# Patient Record
Sex: Female | Born: 1960 | Race: Asian | Hispanic: No | Marital: Married | Smoking: Never smoker
Health system: Southern US, Community
[De-identification: ages and names within clinical notes are randomized; demographics above are authoritative.]

## PROBLEM LIST (undated history)

## (undated) DIAGNOSIS — A0472 Enterocolitis due to Clostridium difficile, not specified as recurrent: Secondary | ICD-10-CM

## (undated) DIAGNOSIS — G9340 Encephalopathy, unspecified: Secondary | ICD-10-CM

## (undated) DIAGNOSIS — D126 Benign neoplasm of colon, unspecified: Secondary | ICD-10-CM

## (undated) DIAGNOSIS — E7800 Pure hypercholesterolemia, unspecified: Secondary | ICD-10-CM

## (undated) DIAGNOSIS — I10 Essential (primary) hypertension: Secondary | ICD-10-CM

## (undated) DIAGNOSIS — J42 Unspecified chronic bronchitis: Secondary | ICD-10-CM

## (undated) DIAGNOSIS — N049 Nephrotic syndrome with unspecified morphologic changes: Secondary | ICD-10-CM

## (undated) DIAGNOSIS — E119 Type 2 diabetes mellitus without complications: Secondary | ICD-10-CM

## (undated) DIAGNOSIS — I2510 Atherosclerotic heart disease of native coronary artery without angina pectoris: Secondary | ICD-10-CM

## (undated) DIAGNOSIS — I509 Heart failure, unspecified: Secondary | ICD-10-CM

## (undated) DIAGNOSIS — K269 Duodenal ulcer, unspecified as acute or chronic, without hemorrhage or perforation: Secondary | ICD-10-CM

## (undated) DIAGNOSIS — E1142 Type 2 diabetes mellitus with diabetic polyneuropathy: Secondary | ICD-10-CM

## (undated) DIAGNOSIS — D649 Anemia, unspecified: Secondary | ICD-10-CM

## (undated) DIAGNOSIS — N184 Chronic kidney disease, stage 4 (severe): Secondary | ICD-10-CM

## (undated) HISTORY — DX: Anemia, unspecified: D64.9

## (undated) HISTORY — DX: Encephalopathy, unspecified: G93.40

## (undated) HISTORY — DX: Chronic kidney disease, stage 4 (severe): N18.4

## (undated) HISTORY — PX: TUBAL LIGATION: SHX77

## (undated) HISTORY — DX: Benign neoplasm of colon, unspecified: D12.6

---

## 2016-10-05 HISTORY — PX: CARDIAC CATHETERIZATION: SHX172

## 2017-10-30 ENCOUNTER — Encounter (HOSPITAL_COMMUNITY): Payer: Self-pay | Admitting: Emergency Medicine

## 2017-10-30 ENCOUNTER — Emergency Department (HOSPITAL_COMMUNITY): Payer: No Typology Code available for payment source

## 2017-10-30 ENCOUNTER — Inpatient Hospital Stay (HOSPITAL_COMMUNITY)
Admission: EM | Admit: 2017-10-30 | Discharge: 2017-11-05 | DRG: 872 | Disposition: A | Discharge status: Rehabilitation Facility | Payer: No Typology Code available for payment source | Attending: Internal Medicine | Admitting: Internal Medicine

## 2017-10-30 DIAGNOSIS — E1122 Type 2 diabetes mellitus with diabetic chronic kidney disease: Secondary | ICD-10-CM | POA: Diagnosis present

## 2017-10-30 DIAGNOSIS — E875 Hyperkalemia: Secondary | ICD-10-CM | POA: Diagnosis not present

## 2017-10-30 DIAGNOSIS — Z960 Presence of urogenital implants: Secondary | ICD-10-CM

## 2017-10-30 DIAGNOSIS — Z9119 Patient's noncompliance with other medical treatment and regimen: Secondary | ICD-10-CM

## 2017-10-30 DIAGNOSIS — Z79899 Other long term (current) drug therapy: Secondary | ICD-10-CM

## 2017-10-30 DIAGNOSIS — Z993 Dependence on wheelchair: Secondary | ICD-10-CM | POA: Diagnosis not present

## 2017-10-30 DIAGNOSIS — J9811 Atelectasis: Secondary | ICD-10-CM | POA: Diagnosis present

## 2017-10-30 DIAGNOSIS — Z87441 Personal history of nephrotic syndrome: Secondary | ICD-10-CM

## 2017-10-30 DIAGNOSIS — D72829 Elevated white blood cell count, unspecified: Secondary | ICD-10-CM

## 2017-10-30 DIAGNOSIS — K269 Duodenal ulcer, unspecified as acute or chronic, without hemorrhage or perforation: Secondary | ICD-10-CM | POA: Diagnosis not present

## 2017-10-30 DIAGNOSIS — R4189 Other symptoms and signs involving cognitive functions and awareness: Secondary | ICD-10-CM | POA: Diagnosis not present

## 2017-10-30 DIAGNOSIS — N319 Neuromuscular dysfunction of bladder, unspecified: Secondary | ICD-10-CM

## 2017-10-30 DIAGNOSIS — R197 Diarrhea, unspecified: Secondary | ICD-10-CM | POA: Diagnosis not present

## 2017-10-30 DIAGNOSIS — N049 Nephrotic syndrome with unspecified morphologic changes: Secondary | ICD-10-CM | POA: Diagnosis not present

## 2017-10-30 DIAGNOSIS — E1165 Type 2 diabetes mellitus with hyperglycemia: Secondary | ICD-10-CM | POA: Diagnosis not present

## 2017-10-30 DIAGNOSIS — K625 Hemorrhage of anus and rectum: Secondary | ICD-10-CM | POA: Diagnosis not present

## 2017-10-30 DIAGNOSIS — Z7901 Long term (current) use of anticoagulants: Secondary | ICD-10-CM | POA: Diagnosis not present

## 2017-10-30 DIAGNOSIS — E1121 Type 2 diabetes mellitus with diabetic nephropathy: Secondary | ICD-10-CM | POA: Diagnosis present

## 2017-10-30 DIAGNOSIS — E162 Hypoglycemia, unspecified: Secondary | ICD-10-CM | POA: Diagnosis not present

## 2017-10-30 DIAGNOSIS — A419 Sepsis, unspecified organism: Principal | ICD-10-CM | POA: Diagnosis present

## 2017-10-30 DIAGNOSIS — I10 Essential (primary) hypertension: Secondary | ICD-10-CM | POA: Diagnosis not present

## 2017-10-30 DIAGNOSIS — D631 Anemia in chronic kidney disease: Secondary | ICD-10-CM | POA: Diagnosis present

## 2017-10-30 DIAGNOSIS — E1140 Type 2 diabetes mellitus with diabetic neuropathy, unspecified: Secondary | ICD-10-CM | POA: Diagnosis present

## 2017-10-30 DIAGNOSIS — N179 Acute kidney failure, unspecified: Secondary | ICD-10-CM | POA: Diagnosis present

## 2017-10-30 DIAGNOSIS — E46 Unspecified protein-calorie malnutrition: Secondary | ICD-10-CM | POA: Diagnosis present

## 2017-10-30 DIAGNOSIS — R29898 Other symptoms and signs involving the musculoskeletal system: Secondary | ICD-10-CM | POA: Diagnosis not present

## 2017-10-30 DIAGNOSIS — E785 Hyperlipidemia, unspecified: Secondary | ICD-10-CM | POA: Diagnosis not present

## 2017-10-30 DIAGNOSIS — F419 Anxiety disorder, unspecified: Secondary | ICD-10-CM | POA: Diagnosis not present

## 2017-10-30 DIAGNOSIS — Z833 Family history of diabetes mellitus: Secondary | ICD-10-CM

## 2017-10-30 DIAGNOSIS — M6281 Muscle weakness (generalized): Secondary | ICD-10-CM

## 2017-10-30 DIAGNOSIS — E860 Dehydration: Secondary | ICD-10-CM | POA: Diagnosis not present

## 2017-10-30 DIAGNOSIS — N390 Urinary tract infection, site not specified: Secondary | ICD-10-CM

## 2017-10-30 DIAGNOSIS — D638 Anemia in other chronic diseases classified elsewhere: Secondary | ICD-10-CM | POA: Diagnosis not present

## 2017-10-30 DIAGNOSIS — I5032 Chronic diastolic (congestive) heart failure: Secondary | ICD-10-CM | POA: Diagnosis not present

## 2017-10-30 DIAGNOSIS — A0472 Enterocolitis due to Clostridium difficile, not specified as recurrent: Secondary | ICD-10-CM | POA: Diagnosis present

## 2017-10-30 DIAGNOSIS — L89152 Pressure ulcer of sacral region, stage 2: Secondary | ICD-10-CM | POA: Diagnosis present

## 2017-10-30 DIAGNOSIS — D123 Benign neoplasm of transverse colon: Secondary | ICD-10-CM | POA: Diagnosis not present

## 2017-10-30 DIAGNOSIS — Z6820 Body mass index (BMI) 20.0-20.9, adult: Secondary | ICD-10-CM

## 2017-10-30 DIAGNOSIS — Z794 Long term (current) use of insulin: Secondary | ICD-10-CM | POA: Diagnosis not present

## 2017-10-30 DIAGNOSIS — K264 Chronic or unspecified duodenal ulcer with hemorrhage: Secondary | ICD-10-CM | POA: Diagnosis not present

## 2017-10-30 DIAGNOSIS — R64 Cachexia: Secondary | ICD-10-CM | POA: Diagnosis not present

## 2017-10-30 DIAGNOSIS — I130 Hypertensive heart and chronic kidney disease with heart failure and stage 1 through stage 4 chronic kidney disease, or unspecified chronic kidney disease: Secondary | ICD-10-CM | POA: Diagnosis present

## 2017-10-30 DIAGNOSIS — R55 Syncope and collapse: Secondary | ICD-10-CM | POA: Diagnosis not present

## 2017-10-30 DIAGNOSIS — I2510 Atherosclerotic heart disease of native coronary artery without angina pectoris: Secondary | ICD-10-CM | POA: Diagnosis present

## 2017-10-30 DIAGNOSIS — I5022 Chronic systolic (congestive) heart failure: Secondary | ICD-10-CM | POA: Diagnosis present

## 2017-10-30 DIAGNOSIS — J810 Acute pulmonary edema: Secondary | ICD-10-CM | POA: Diagnosis not present

## 2017-10-30 DIAGNOSIS — Z7401 Bed confinement status: Secondary | ICD-10-CM

## 2017-10-30 DIAGNOSIS — E11649 Type 2 diabetes mellitus with hypoglycemia without coma: Secondary | ICD-10-CM

## 2017-10-30 DIAGNOSIS — G7281 Critical illness myopathy: Secondary | ICD-10-CM | POA: Diagnosis present

## 2017-10-30 DIAGNOSIS — R159 Full incontinence of feces: Secondary | ICD-10-CM | POA: Diagnosis present

## 2017-10-30 DIAGNOSIS — R7989 Other specified abnormal findings of blood chemistry: Secondary | ICD-10-CM | POA: Diagnosis not present

## 2017-10-30 DIAGNOSIS — T68XXXA Hypothermia, initial encounter: Secondary | ICD-10-CM | POA: Diagnosis not present

## 2017-10-30 DIAGNOSIS — I5020 Unspecified systolic (congestive) heart failure: Secondary | ICD-10-CM | POA: Diagnosis not present

## 2017-10-30 DIAGNOSIS — I82491 Acute embolism and thrombosis of other specified deep vein of right lower extremity: Secondary | ICD-10-CM | POA: Diagnosis not present

## 2017-10-30 DIAGNOSIS — R5381 Other malaise: Secondary | ICD-10-CM | POA: Diagnosis not present

## 2017-10-30 DIAGNOSIS — R195 Other fecal abnormalities: Secondary | ICD-10-CM | POA: Diagnosis not present

## 2017-10-30 DIAGNOSIS — R0602 Shortness of breath: Secondary | ICD-10-CM | POA: Diagnosis not present

## 2017-10-30 DIAGNOSIS — R680 Hypothermia, not associated with low environmental temperature: Secondary | ICD-10-CM | POA: Diagnosis present

## 2017-10-30 DIAGNOSIS — E11319 Type 2 diabetes mellitus with unspecified diabetic retinopathy without macular edema: Secondary | ICD-10-CM | POA: Diagnosis present

## 2017-10-30 DIAGNOSIS — F329 Major depressive disorder, single episode, unspecified: Secondary | ICD-10-CM | POA: Diagnosis present

## 2017-10-30 DIAGNOSIS — Z888 Allergy status to other drugs, medicaments and biological substances status: Secondary | ICD-10-CM

## 2017-10-30 DIAGNOSIS — G9340 Encephalopathy, unspecified: Secondary | ICD-10-CM | POA: Diagnosis present

## 2017-10-30 DIAGNOSIS — R8271 Bacteriuria: Secondary | ICD-10-CM | POA: Diagnosis not present

## 2017-10-30 DIAGNOSIS — R0989 Other specified symptoms and signs involving the circulatory and respiratory systems: Secondary | ICD-10-CM | POA: Diagnosis not present

## 2017-10-30 DIAGNOSIS — R7309 Other abnormal glucose: Secondary | ICD-10-CM | POA: Diagnosis not present

## 2017-10-30 DIAGNOSIS — Z6824 Body mass index (BMI) 24.0-24.9, adult: Secondary | ICD-10-CM

## 2017-10-30 DIAGNOSIS — Z9114 Patient's other noncompliance with medication regimen: Secondary | ICD-10-CM

## 2017-10-30 DIAGNOSIS — J42 Unspecified chronic bronchitis: Secondary | ICD-10-CM | POA: Diagnosis present

## 2017-10-30 DIAGNOSIS — E119 Type 2 diabetes mellitus without complications: Secondary | ICD-10-CM | POA: Diagnosis not present

## 2017-10-30 DIAGNOSIS — Z87440 Personal history of urinary (tract) infections: Secondary | ICD-10-CM | POA: Diagnosis not present

## 2017-10-30 DIAGNOSIS — F88 Other disorders of psychological development: Secondary | ICD-10-CM | POA: Diagnosis not present

## 2017-10-30 DIAGNOSIS — R32 Unspecified urinary incontinence: Secondary | ICD-10-CM | POA: Diagnosis present

## 2017-10-30 DIAGNOSIS — D62 Acute posthemorrhagic anemia: Secondary | ICD-10-CM | POA: Diagnosis present

## 2017-10-30 DIAGNOSIS — R9389 Abnormal findings on diagnostic imaging of other specified body structures: Secondary | ICD-10-CM | POA: Diagnosis not present

## 2017-10-30 DIAGNOSIS — L8990 Pressure ulcer of unspecified site, unspecified stage: Secondary | ICD-10-CM

## 2017-10-30 DIAGNOSIS — E11622 Type 2 diabetes mellitus with other skin ulcer: Secondary | ICD-10-CM | POA: Diagnosis not present

## 2017-10-30 DIAGNOSIS — E1142 Type 2 diabetes mellitus with diabetic polyneuropathy: Secondary | ICD-10-CM | POA: Diagnosis not present

## 2017-10-30 DIAGNOSIS — R1030 Lower abdominal pain, unspecified: Secondary | ICD-10-CM | POA: Diagnosis not present

## 2017-10-30 DIAGNOSIS — N184 Chronic kidney disease, stage 4 (severe): Secondary | ICD-10-CM | POA: Diagnosis present

## 2017-10-30 DIAGNOSIS — I169 Hypertensive crisis, unspecified: Secondary | ICD-10-CM | POA: Diagnosis not present

## 2017-10-30 DIAGNOSIS — R402413 Glasgow coma scale score 13-15, at hospital admission: Secondary | ICD-10-CM | POA: Diagnosis present

## 2017-10-30 DIAGNOSIS — D500 Iron deficiency anemia secondary to blood loss (chronic): Secondary | ICD-10-CM | POA: Diagnosis not present

## 2017-10-30 DIAGNOSIS — D649 Anemia, unspecified: Secondary | ICD-10-CM

## 2017-10-30 DIAGNOSIS — L89159 Pressure ulcer of sacral region, unspecified stage: Secondary | ICD-10-CM | POA: Diagnosis not present

## 2017-10-30 DIAGNOSIS — E118 Type 2 diabetes mellitus with unspecified complications: Secondary | ICD-10-CM | POA: Diagnosis not present

## 2017-10-30 DIAGNOSIS — Z886 Allergy status to analgesic agent status: Secondary | ICD-10-CM

## 2017-10-30 DIAGNOSIS — Z7902 Long term (current) use of antithrombotics/antiplatelets: Secondary | ICD-10-CM

## 2017-10-30 DIAGNOSIS — E861 Hypovolemia: Secondary | ICD-10-CM | POA: Diagnosis present

## 2017-10-30 DIAGNOSIS — M7989 Other specified soft tissue disorders: Secondary | ICD-10-CM | POA: Diagnosis not present

## 2017-10-30 DIAGNOSIS — N183 Chronic kidney disease, stage 3 (moderate): Secondary | ICD-10-CM | POA: Diagnosis not present

## 2017-10-30 DIAGNOSIS — Z8661 Personal history of infections of the central nervous system: Secondary | ICD-10-CM

## 2017-10-30 DIAGNOSIS — N189 Chronic kidney disease, unspecified: Secondary | ICD-10-CM | POA: Diagnosis not present

## 2017-10-30 DIAGNOSIS — R8290 Unspecified abnormal findings in urine: Secondary | ICD-10-CM | POA: Diagnosis not present

## 2017-10-30 DIAGNOSIS — T68XXXD Hypothermia, subsequent encounter: Secondary | ICD-10-CM | POA: Diagnosis not present

## 2017-10-30 HISTORY — DX: Unspecified chronic bronchitis: J42

## 2017-10-30 HISTORY — DX: Type 2 diabetes mellitus without complications: E11.9

## 2017-10-30 HISTORY — DX: Heart failure, unspecified: I50.9

## 2017-10-30 HISTORY — DX: Pure hypercholesterolemia, unspecified: E78.00

## 2017-10-30 HISTORY — DX: Essential (primary) hypertension: I10

## 2017-10-30 LAB — COMPREHENSIVE METABOLIC PANEL
ALBUMIN: 2 g/dL — AB (ref 3.5–5.0)
ALT: 9 U/L — AB (ref 14–54)
AST: 23 U/L (ref 15–41)
Alkaline Phosphatase: 124 U/L (ref 38–126)
Anion gap: 13 (ref 5–15)
BUN: 52 mg/dL — ABNORMAL HIGH (ref 6–20)
CHLORIDE: 103 mmol/L (ref 101–111)
CO2: 21 mmol/L — AB (ref 22–32)
CREATININE: 2.43 mg/dL — AB (ref 0.44–1.00)
Calcium: 8.2 mg/dL — ABNORMAL LOW (ref 8.9–10.3)
GFR calc non Af Amer: 21 mL/min — ABNORMAL LOW (ref >60)
GFR, EST AFRICAN AMERICAN: 24 mL/min — AB (ref >60)
GLUCOSE: 140 mg/dL — AB (ref 65–99)
Potassium: 5.1 mmol/L (ref 3.5–5.1)
SODIUM: 137 mmol/L (ref 135–145)
Total Bilirubin: 1.1 mg/dL (ref 0.3–1.2)
Total Protein: 6 g/dL — ABNORMAL LOW (ref 6.5–8.1)

## 2017-10-30 LAB — URINALYSIS, ROUTINE W REFLEX MICROSCOPIC
Bilirubin Urine: NEGATIVE
Glucose, UA: 50 mg/dL — AB
Hgb urine dipstick: NEGATIVE
KETONES UR: NEGATIVE mg/dL
Nitrite: POSITIVE — AB
Specific Gravity, Urine: 1.015 (ref 1.005–1.030)
pH: 7 (ref 5.0–8.0)

## 2017-10-30 LAB — CBG MONITORING, ED
GLUCOSE-CAPILLARY: 90 mg/dL (ref 65–99)
GLUCOSE-CAPILLARY: 157 mg/dL — AB (ref 65–99)
Glucose-Capillary: 150 mg/dL — ABNORMAL HIGH (ref 65–99)
Glucose-Capillary: 123 mg/dL — ABNORMAL HIGH (ref 65–99)
Glucose-Capillary: 152 mg/dL — ABNORMAL HIGH (ref 65–99)
Glucose-Capillary: 51 mg/dL — ABNORMAL LOW (ref 65–99)
Glucose-Capillary: 66 mg/dL (ref 65–99)

## 2017-10-30 LAB — GLUCOSE, CAPILLARY
GLUCOSE-CAPILLARY: 89 mg/dL (ref 65–99)
Glucose-Capillary: 116 mg/dL — ABNORMAL HIGH (ref 65–99)
Glucose-Capillary: 127 mg/dL — ABNORMAL HIGH (ref 65–99)
Glucose-Capillary: 89 mg/dL (ref 65–99)

## 2017-10-30 LAB — I-STAT CG4 LACTIC ACID, ED
LACTIC ACID, VENOUS: 1.39 mmol/L (ref 0.5–1.9)
LACTIC ACID, VENOUS: 0.52 mmol/L (ref 0.5–1.9)

## 2017-10-30 LAB — CBC
HCT: 31.4 % — ABNORMAL LOW (ref 36.0–46.0)
Hemoglobin: 10.1 g/dL — ABNORMAL LOW (ref 12.0–15.0)
MCH: 26.8 pg (ref 26.0–34.0)
MCHC: 32.2 g/dL (ref 30.0–36.0)
MCV: 83.3 fL (ref 78.0–100.0)
Platelets: 535 10*3/uL — ABNORMAL HIGH (ref 150–400)
RBC: 3.77 MIL/uL — AB (ref 3.87–5.11)
RDW: 15.6 % — ABNORMAL HIGH (ref 11.5–15.5)
WBC: 18.7 10*3/uL — AB (ref 4.0–10.5)

## 2017-10-30 LAB — MRSA PCR SCREENING: MRSA by PCR: NEGATIVE

## 2017-10-30 LAB — LIPASE, BLOOD: Lipase: 17 U/L (ref 11–51)

## 2017-10-30 MED ORDER — ISOSORBIDE MONONITRATE ER 60 MG PO TB24
60.0000 mg | ORAL_TABLET | Freq: Every day | ORAL | Status: DC
  Administered 2017-10-30 – 2017-11-05 (×7): 60 mg via ORAL
  Filled 2017-10-30 (×7): qty 1

## 2017-10-30 MED ORDER — NIFEDIPINE ER OSMOTIC RELEASE 30 MG PO TB24
30.0000 mg | ORAL_TABLET | Freq: Two times a day (BID) | ORAL | Status: DC
  Administered 2017-10-30 – 2017-11-05 (×12): 30 mg via ORAL
  Filled 2017-10-30 (×13): qty 1

## 2017-10-30 MED ORDER — DEXTROSE 50 % IV SOLN
1.0000 | Freq: Two times a day (BID) | INTRAVENOUS | Status: DC | PRN
Start: 2017-10-30 — End: 2017-11-05
  Administered 2017-10-31 (×2): 25 mL via INTRAVENOUS
  Filled 2017-10-30 (×2): qty 50

## 2017-10-30 MED ORDER — SODIUM CHLORIDE 0.9 % IV BOLUS (SEPSIS)
1000.0000 mL | Freq: Once | INTRAVENOUS | Status: AC
  Administered 2017-10-30: 1000 mL via INTRAVENOUS

## 2017-10-30 MED ORDER — EZETIMIBE 10 MG PO TABS
10.0000 mg | ORAL_TABLET | Freq: Every day | ORAL | Status: DC
  Administered 2017-10-30 – 2017-11-05 (×7): 10 mg via ORAL
  Filled 2017-10-30 (×7): qty 1

## 2017-10-30 MED ORDER — CLOPIDOGREL BISULFATE 75 MG PO TABS
75.0000 mg | ORAL_TABLET | Freq: Every day | ORAL | Status: DC
  Administered 2017-10-30 – 2017-11-05 (×7): 75 mg via ORAL
  Filled 2017-10-30 (×7): qty 1

## 2017-10-30 MED ORDER — DEXTROSE 50 % IV SOLN
1.0000 | Freq: Once | INTRAVENOUS | Status: AC
  Administered 2017-10-30: 50 mL via INTRAVENOUS
  Filled 2017-10-30: qty 50

## 2017-10-30 MED ORDER — DEXTROSE 5 % IV SOLN
1.0000 g | INTRAVENOUS | Status: DC
  Administered 2017-10-30 – 2017-10-31 (×2): 1 g via INTRAVENOUS
  Filled 2017-10-30 (×2): qty 10

## 2017-10-30 MED ORDER — BISACODYL 5 MG PO TBEC
10.0000 mg | DELAYED_RELEASE_TABLET | Freq: Every day | ORAL | Status: DC | PRN
Start: 2017-10-30 — End: 2017-10-31

## 2017-10-30 MED ORDER — GLUCERNA SHAKE PO LIQD
237.0000 mL | Freq: Three times a day (TID) | ORAL | Status: DC
  Administered 2017-10-30 – 2017-11-01 (×4): 237 mL via ORAL
  Filled 2017-10-30: qty 237

## 2017-10-30 MED ORDER — HEPARIN SODIUM (PORCINE) 5000 UNIT/ML IJ SOLN
5000.0000 [IU] | Freq: Three times a day (TID) | INTRAMUSCULAR | Status: DC
  Administered 2017-10-30 – 2017-11-05 (×18): 5000 [IU] via SUBCUTANEOUS
  Filled 2017-10-30 (×18): qty 1

## 2017-10-30 MED ORDER — DEXTROSE 50 % IV SOLN
1.0000 | Freq: Once | INTRAVENOUS | Status: AC
  Administered 2017-10-30: 50 mL via INTRAVENOUS

## 2017-10-30 MED ORDER — PANTOPRAZOLE SODIUM 40 MG PO TBEC
40.0000 mg | DELAYED_RELEASE_TABLET | Freq: Every day | ORAL | Status: DC
  Administered 2017-10-30 – 2017-10-31 (×2): 40 mg via ORAL
  Filled 2017-10-30 (×2): qty 1

## 2017-10-30 MED ORDER — GLUCAGON HCL RDNA (DIAGNOSTIC) 1 MG IJ SOLR: 1.0000 mg | Freq: Once | INTRAMUSCULAR | Status: DC | PRN

## 2017-10-30 MED ORDER — DEXTROSE 50 % IV SOLN
INTRAVENOUS | Status: AC
  Administered 2017-10-30: 50 mL via INTRAVENOUS
  Filled 2017-10-30: qty 50

## 2017-10-30 MED ORDER — MIRTAZAPINE 15 MG PO TABS
15.0000 mg | ORAL_TABLET | Freq: Every day | ORAL | Status: DC
  Administered 2017-10-30 – 2017-11-04 (×6): 15 mg via ORAL
  Filled 2017-10-30 (×7): qty 1

## 2017-10-30 MED ORDER — METOPROLOL TARTRATE 50 MG PO TABS
50.0000 mg | ORAL_TABLET | Freq: Two times a day (BID) | ORAL | Status: DC
  Administered 2017-10-30 – 2017-11-05 (×12): 50 mg via ORAL
  Filled 2017-10-30 (×12): qty 1

## 2017-10-30 MED ORDER — GERHARDT'S BUTT CREAM
TOPICAL_CREAM | Freq: Four times a day (QID) | CUTANEOUS | Status: DC
  Administered 2017-10-30: 20:00:00 via TOPICAL
  Administered 2017-10-31: 1 via TOPICAL
  Administered 2017-10-31 – 2017-11-05 (×19): via TOPICAL
  Filled 2017-10-30: qty 1

## 2017-10-30 MED ORDER — VENLAFAXINE HCL ER 75 MG PO CP24
150.0000 mg | ORAL_CAPSULE | Freq: Every day | ORAL | Status: DC
Start: 2017-10-31 — End: 2017-11-05
  Administered 2017-10-31 – 2017-11-05 (×6): 150 mg via ORAL
  Filled 2017-10-30 (×6): qty 2

## 2017-10-30 NOTE — ED Provider Notes (Signed)
[facility]ERG[facility] DEPARTMENT Provider Note   CSN: [id number] Arrival date & time: 10/30/17  6063     History   Chief Complaint Chief Complaint  Patient presents with  . Hypoglycemia    HPI [name] is a 57 y.o. female presenting from [place] living facility for hypoglycemic episodes down to 25 yesterday and today, improved with glucagon. Per triage report, patient's CBG was 25 today and only came up to 67 after glucagon, prompting her to be brought in by EMS. She was given cranberry juice and apple sauce with EMS and sugar improved to 137.  Per EMS reports she is on novolog and lantus but we do not have the doses.  On initial presentation to emergency room, patient is oriented to person but not place or time. She has a cognitive impairment with uncertain baseline. She is able to answer questions appropriately and follow commands. Her rectal temperature was 94.48F taken twice. She endorses diffuse abdominal pain that has been there for about 2 weeks. She denies fevers, congestion or rhinorrhea, wheezing or dyspnea, no chest pain. She has a urinary catheter in place. She has a stage II pressure ulcer above her rectum.    Multiple attempts to speak with the patient's living facility to get more information were unsuccessful (kept getting transferred and then cut off).   Past Medical History:  Diagnosis Date  . Diabetes mellitus without complication ([place])     There are no active problems to display for this patient.   History reviewed. No pertinent surgical history.  OB History    No data available     Home Medications    Prior to Admission medications   Medication Sig Start Date End Date Taking? Authorizing Provider  acetaminophen (TYLENOL) 325 MG tablet Take 650 mg by mouth every 4 (four) hours as needed for mild pain.   Yes [provider]  aluminum-magnesium hydroxide-simethicone (MAALOX) 200-200-20 MG/5ML SUSP Take 60 mLs by mouth  4 (four) times daily -  before meals and at bedtime.   Yes [provider]  bisacodyl (DULCOLAX) 5 MG EC tablet Take 10 mg by mouth daily as needed for moderate constipation.   Yes [provider]  clopidogrel (PLAVIX) 75 MG tablet Take 75 mg by mouth daily.   Yes [provider]  ezetimibe (ZETIA) 10 MG tablet Take 10 mg by mouth daily.   Yes [provider]  furosemide (LASIX) 20 MG tablet Take 20 mg by mouth daily.   Yes [provider]  insulin aspart (NOVOLOG) 100 UNIT/ML injection Inject 0-10 Units into the skin See admin instructions. If blood sugar is <70 call MD, 71-250=0, 251-300=2u, 301-350=4,351-400=6u, 401-500=8u, >500=10u and call MD   Yes [provider]  insulin glargine (LANTUS) 100 UNIT/ML injection Inject 5 Units into the skin at bedtime.   Yes [provider]  isosorbide mononitrate (IMDUR) 60 MG 24 hr tablet Take 60 mg by mouth daily.   Yes [provider]  metoprolol tartrate (LOPRESSOR) 50 MG tablet Take 50 mg by mouth 2 (two) times daily.   Yes [provider]  mirtazapine (REMERON) 15 MG tablet Take 15 mg by mouth at bedtime.   Yes [provider]  modafinil (PROVIGIL) 200 MG tablet Take 200 mg by mouth daily.   Yes [provider]  NIFEdipine (PROCARDIA-XL/ADALAT-CC/NIFEDICAL-XL) 30 MG 24 hr tablet Take 30 mg by mouth 2 (two) times daily.   Yes [provider]  pantoprazole (PROTONIX) 40  MG tablet Take 40 mg by mouth daily.   Yes [provider]  PRESCRIPTION MEDICATION Take 120 mLs by mouth 2 (two) times daily. Med Pass   Yes [provider]  simethicone (MYLICON) 80 MG chewable tablet Chew 80 mg by mouth 4 (four) times daily as needed for flatulence.   Yes [provider]  venlafaxine XR (EFFEXOR-XR) 150 MG 24 hr capsule Take 150 mg by mouth daily with breakfast.   Yes [provider]  venlafaxine XR (EFFEXOR-XR) 75 MG 24 hr  capsule Take 75 mg by mouth daily with breakfast.   Yes [provider]    Family History No family history on file.  Social History Social History   Tobacco Use  . Smoking status: Never Smoker  Substance Use Topics  . Alcohol use: No    Frequency: Never  . Drug use: No     Allergies   Aspirin   Review of Systems Review of Systems See HPI for ROS.   Physical Exam Updated Vital Signs BP (!) 160/96   Pulse 97   Temp (!) 97.2 F (36.2 C) (Rectal)   Resp 10   Ht 5' (1.524 m)   SpO2 96%   Physical Exam  Constitutional:  Thin woman lies in bed, NAD, alert and answering questions appropriately  HENT:  Head: Normocephalic and atraumatic.  Right Ear: External ear normal.  Left Ear: External ear normal.  Mouth/Throat: Oropharynx is clear and moist. No oropharyngeal exudate.  Eyes: Conjunctivae and EOM are normal.  Neck: Neck supple.  Cardiovascular: Normal rate and regular rhythm. Exam reveals no gallop and no friction rub.  No murmur heard. Pulmonary/Chest: Breath sounds normal. She has no wheezes. She has no rales. She exhibits no tenderness.  Poor effort. Comfortable work of breathing.  Abdominal: Soft. She exhibits no distension and no mass. There is no guarding.  +diffuse tenderness to palpation throughout her abdomen without rebound or guarding.  Genitourinary:  Genitourinary Comments: +urinary catheter in place  Musculoskeletal: She exhibits no deformity.  Neurological: No cranial nerve deficit.  Oriented to person but not time, place, or situation  Skin: Skin is warm and dry. Capillary refill takes more than 3 seconds. No rash noted. She is not diaphoretic.  +stage 2 pressure ulcer noted above rectum    ED Treatments / Results  Labs (all labs ordered are listed, but only abnormal results are displayed) Labs Reviewed  URINALYSIS, ROUTINE W REFLEX MICROSCOPIC - Abnormal; Notable for the following components:      Result Value   APPearance CLOUDY  (*)    Glucose, UA 50 (*)    Protein, ur >=300 (*)    Nitrite POSITIVE (*)    Leukocytes, UA LARGE (*)    Bacteria, UA MANY (*)    Squamous Epithelial / LPF 0-5 (*)    All other components within normal limits  CBC - Abnormal; Notable for the following components:   WBC 18.7 (*)    RBC 3.77 (*)    Hemoglobin 10.1 (*)    HCT 31.4 (*)    RDW 15.6 (*)    Platelets 535 (*)    All other components within normal limits  COMPREHENSIVE METABOLIC PANEL - Abnormal; Notable for the following components:   CO2 21 (*)    Glucose, Bld 140 (*)    BUN 52 (*)    Creatinine, Ser 2.43 (*)    Calcium 8.2 (*)    Total Protein 6.0 (*)    Albumin  2.0 (*)    ALT 9 (*)    GFR calc non Af Amer 21 (*)    GFR calc Af Amer 24 (*)    All other components within normal limits  CBG MONITORING, ED - Abnormal; Notable for the following components:   Glucose-Capillary 150 (*)    All other components within normal limits  CBG MONITORING, ED - Abnormal; Notable for the following components:   Glucose-Capillary 123 (*)    All other components within normal limits  CBG MONITORING, ED - Abnormal; Notable for the following components:   Glucose-Capillary 51 (*)    All other components within normal limits  CBG MONITORING, ED - Abnormal; Notable for the following components:   Glucose-Capillary 152 (*)    All other components within normal limits  CULTURE, BLOOD (ROUTINE X 2)  CULTURE, BLOOD (ROUTINE X 2)  LIPASE, BLOOD  I-STAT CG4 LACTIC ACID, ED  I-STAT CG4 LACTIC ACID, ED    EKG  EKG Interpretation None       Radiology Dg Chest 2 View  Result Date: 10/30/2017 CLINICAL DATA:  Chest infection. EXAM: CHEST  2 VIEW COMPARISON:  10/02/2017. FINDINGS: Cardiomegaly and/or pericardial fluid. Bilateral pleural effusions layering dependently with dependent atelectasis. Mild interstitial edema pattern diffusely. No significant bone finding. IMPRESSION: Enlarged cardiac silhouette. Interstitial edema pattern.  Bilateral effusions with dependent atelectasis. Electronically Signed   By: Nelson Chimes M.D.   On: 10/30/2017 09:34    Procedures Procedures (including critical care time)  Medications Ordered in ED Medications  cefTRIAXone (ROCEPHIN) 1 g in dextrose 5 % 50 mL IVPB (0 g Intravenous Stopped 10/30/17 1255)  sodium chloride 0.9 % bolus 1,000 mL (0 mLs Intravenous Stopped 10/30/17 1026)  sodium chloride 0.9 % bolus 1,000 mL (0 mLs Intravenous Stopped 10/30/17 1247)  dextrose 50 % solution 50 mL (50 mLs Intravenous Given 10/30/17 1250)     Initial Impression / Assessment and Plan / ED Course  I have reviewed the triage vital signs and the nursing notes.  Pertinent labs & imaging results that were available during my care of the patient were reviewed by me and considered in my medical decision making (see chart for details).    82yoF presenting from nursing facility with hypoglycemia and hypothermia. Ddx includes iatrogenic with administration of novolog or lantus at the nursing home, vs. Infectious given hypothermia to 94.66F, leukocytosis to 18.7. Most likely infectious source is UTI with positive UA for nitrites, large leuks, many bacteria, likely 2/2 indwelling catheter. Patient notably also has AKI. Other infectious etiologies considered include PNA, which is less likely with negative CXR. Grade II pressure ulcer considered as infectious source, though this seems less likely given that there is no purulent drainage or surrounding cellulitis.  Patient was given IV fluid bolus x2 and ceftriaxone for UTI. Foley was replaced. Bear hugger was placed until patient was normothermic. Patient dropped glucose to 50 requiring D50 again. Iatrogenic cause of hypoglycemia seems most likely, particularly if patient normally gets lantus at assisted living facility and has had poor PO intake recently.  Because patient had multiple hypoglycemic episodes and has limited communication abilities with cognitive  impairment, it is appropriate for patient to be observed overnight with serial CBGs. Case was discussed with admitting team.  Final Clinical Impressions(s) / ED Diagnoses   Final diagnoses:  Hypoglycemia  Lower urinary tract infectious disease    ED Discharge Orders    None       Everrett Coombe, MD 10/30/17 1423  Carmin Muskrat, MD 10/31/17 [phone]

## 2017-10-30 NOTE — ED Triage Notes (Signed)
Patient coming from [facility] with low blood sugars that started yesterday. Sugar yesterday was 37, which came up after glucagon. Sugar today was 25 that only came up to 67 after glucagon. Ems reports sugar 137. Patient received cranberry juice and apples sauce with ems. Patient has mild cognitive disorder. Patient only oriented to self and drowsy upon arrival.

## 2017-10-30 NOTE — Progress Notes (Signed)
Pt with Moisture associated skin damage from area above rectum to the labial area.  Pt/husband states she has had diarrhea.  Pt has a foley in place and PTA.  Consulted with Maudie Flakes, WOC via phone and recommendation for Gerhardts butt cream and mattress replacement given. Orders placed. Pt to lay side to side with no time on back.

## 2017-10-30 NOTE — Progress Notes (Signed)
CSW received consult for patient because she came from [facility]. Patient is being admitted but CSW noticed unsuccessful attempts from nursing staff to reach facility to get history on patient. CSW placed call to [place] at [facility], informed her of situation and gave her ED Secretary number to have the facility nurse report to.  Madilyn Fireman, MSW, LCSW-A Weekend Clinical Social Worker [phone]

## 2017-10-30 NOTE — ED Notes (Signed)
Dextrose 50 given 

## 2017-10-30 NOTE — H&P (Signed)
Date: 10/30/2017               Patient Name:  Laura [name] MRN: [id number]  DOB: 01-May-1961 Age / Sex: 57 y.o., female   PCP: System, Pcp Not In         Medical Service: Internal Medicine Teaching Service         Attending Physician: Dr. Evette Doffing, Mallie Mussel, *    First Contact: Dr. Frederico Hamman Pager: [phone]  Second Contact: Dr. Hetty Ely  Pager: [phone]       After Hours (After 5p/  First Contact Pager: [phone]  weekends / holidays): Second Contact Pager: [phone]   Chief Complaint: Hypoglycemia   History of Present Illness:  Laura [name] is a 57 year old female with history of insulin-dependent T2DM, ?HFrEF (EF 40%), and cognitive delay who presents to the ED with persistent hypoglycemia.  Patient is a poor historian and is unable to provide history.  Husband at bedside provides most of the history.  Patient was admitted to [facility] on 08/2017 and was diagnosed with systolic heart failure.  She was also found to be in acute renal failure and needed several sessions of hemodialysis.  Her renal function did not recover to baseline after this.  She continues to make urine however Foley catheter in place due to inability to void spontaneously since then.  She is also severely deconditioned and is no longer able to ambulate. She was admitted for 1 month and discharged to [facility] where she has continued to receive PT with very minimal improvement in functional status.  Per husband, patient is ambulatory at baseline and able to perform all ADLs independently. At the SNF, patient has been having poor appetite due to abdominal pain.  However, it appears that she has continue to receive sliding scale insulin despite decreased p.o. Intake. Last night, her BG was 25 and this morning BG was low again which prompted staff to call EMS. She denies headache, dizziness, chest pain, shortness of breath, cough, and N/V. She does report loose stools for the past 7 days.   Attempted to call  [facility] on 3 separate occasions with no success. Review of documentation from SNF shows patient received SS insulin at 1:30pm and Lantus 5 at 10PM.   ED course: Patient was hypothermic T 94.3, BP 144/83, HR 88, 98* on RA. Labwork remarkable for WBC 18.2, Hgb 10.1 (unknown baseline), and Cr 2.43 (unknown baseline). Her UA showed nitrites, leukocytes, many bacteria, and glucosuria.  She required Dextrose on 2 separate occasions for CBGs 51 and 66. She also received 2L NS bolus and started on Rocephin.   Review of Systems: A complete ROS was negative except as per HPI.   Meds:  Current Meds  Medication Sig  . acetaminophen (TYLENOL) 325 MG tablet Take 650 mg by mouth every 4 (four) hours as needed for mild pain.  Marland Kitchen aluminum-magnesium hydroxide-simethicone (MAALOX) [phone] MG/5ML SUSP Take 60 mLs by mouth 4 (four) times daily -  before meals and at bedtime.  . bisacodyl (DULCOLAX) 5 MG EC tablet Take 10 mg by mouth daily as needed for moderate constipation.  . clopidogrel (PLAVIX) 75 MG tablet Take 75 mg by mouth daily.  Marland Kitchen ezetimibe (ZETIA) 10 MG tablet Take 10 mg by mouth daily.  . furosemide (LASIX) 20 MG tablet Take 20 mg by mouth daily.  . insulin aspart (NOVOLOG) 100 UNIT/ML injection Inject 0-10 Units into the skin See admin instructions. If blood sugar is <70 call  MD, 71-250=0, 251-300=2u, 301-350=4,351-400=6u, 401-500=8u, >500=10u and call MD  . insulin glargine (LANTUS) 100 UNIT/ML injection Inject 5 Units into the skin at bedtime.  . isosorbide mononitrate (IMDUR) 60 MG 24 hr tablet Take 60 mg by mouth daily.  . metoprolol tartrate (LOPRESSOR) 50 MG tablet Take 50 mg by mouth 2 (two) times daily.  . mirtazapine (REMERON) 15 MG tablet Take 15 mg by mouth at bedtime.  . modafinil (PROVIGIL) 200 MG tablet Take 200 mg by mouth daily.  Marland Kitchen NIFEdipine (PROCARDIA-XL/ADALAT-CC/NIFEDICAL-XL) 30 MG 24 hr tablet Take 30 mg by mouth 2 (two) times daily.  . pantoprazole (PROTONIX) 40 MG tablet  Take 40 mg by mouth daily.  Marland Kitchen PRESCRIPTION MEDICATION Take 120 mLs by mouth 2 (two) times daily. Med Pass  . simethicone (MYLICON) 80 MG chewable tablet Chew 80 mg by mouth 4 (four) times daily as needed for flatulence.  . venlafaxine XR (EFFEXOR-XR) 150 MG 24 hr capsule Take 150 mg by mouth daily with breakfast.  . venlafaxine XR (EFFEXOR-XR) 75 MG 24 hr capsule Take 75 mg by mouth daily with breakfast.     Allergies: Allergies as of 10/30/2017 - Review Complete 10/30/2017  Allergen Reaction Noted  . Aspirin Nausea Only 10/30/2017   Past Medical History:  Diagnosis Date  . Diabetes mellitus without complication (HCC)     Family History:  DM - all siblings  Cancer - father   Social History:  Lives at home with husband. Has 2 children. Has cognitive delays but able to perform ADLs independently. Denies tobacco, alcohol, and drug use. She does not have a regular PCP.    Physical Exam: Blood pressure (!) 166/94, pulse (!) 102, temperature (!) 97.2 F (36.2 C), temperature source Rectal, resp. rate 16, height 5' (1.524 m), SpO2 96 %.  General: very pleasant female, appears chronically-ill and malnourished, sleeping in bed in no acute distress   HENT: NCAT, neck supple and FROM, OP clear without exudates or erythema, poor dentition  Cardiac: regular rate and rhythm, nl S1/S2, no murmurs, rubs or gallops  Pulm: bibasilar crackles, no increased work of breathing on room air  Abd: soft, non-distended, diffusely tender to palpation, normoactive bowel sounds present Neuro: A&Ox3, CN II-XII intact, grip strength intact bilaterally,  5/5 strength on bilateral upper extremities, 2/5 strength on bilateral lower extremities  Ext: warm and well perfused, trace pedal edema noted, diminshed DP pulses bilaterally  Derm: there is a pressure ulcer on sacrum, dressing c/d/i   EKG: none obtained  CXR: personally reviewed my interpretation is bilateral pleural effusions, no opacities noted    Assessment & Plan by Problem: Active Problems:   Urinary tract infection  # Hypoglycemia: Patient presents after 2 episodes of hypoglycemia at SNF. She required 2 doses of dextrose in the ED for persistent hypoglycemia.  Review of medication list shows that she received Lantus 5 last night. BG log with several readings below 70.  - CBG monitoring q2h  - Dextrose amp if CBG <80 - Holding insulin regimen   # UTI: Patient has had chronic indwelling foley catheter for the past ~ 2 months. Unclear how often this is exchanged or the last time it was exchanged. Per husband, patient lost her ability to void spontaneously 2 months ago during hospitalization at [facility]. UA on admission with nitrites, large leukocytes, and many bacteria.  - Follow up Ucx - Continue Rocephin  - Exchange catheter   # Deconditioning: Secondary to recent prolonged hospitalization.  Per husband, patient able to  ambulate and perform ADLs independently prior to hospitalization.  Now unable to ambulate or move lower extremities.  - PT  - SW consult   # ?CKD: Baseline creatinine unknown. Cr 2.4 today. Per husband, patient did not have a history of kidney disease prior to November admission. -We will monitor renal function - Strict I/Os -We will hold home Lasix as unclear if patient has an AKI -Avoid nephrotoxic medications -Holding home modafinil (unclear indication)  # Insulin-dependent T2DM: Patient was diagnosed with type 2 diabetes 20 years ago.  Her hyperglycemia has been difficult to control due to medication noncompliance.  Her insulin regimen is unclear at this time.  Per SNF documentation, she is receiving Lantus 5 at bedtime and sliding scale insulin. -Hold insulin in the setting of persistent hypoglycemia  # ?HFrEF: Per husband's report, she was diagnosed with systolic HF during her admission on 08/2017. Her EF at the time was 40 %, though no records available to confirm this. On exam, she has mild bibasilar  crackles and trace pedal edema.  No JVD noted. She does not follow up with cardiology.  We will continue to monitor volume and respiratory status as she received 2 L NS bolus in the ED. - Strict I/Os + daily weights  - Continue home metoprolol 50 mg BID - Continue home Imdur 60 mg QD  - Continue home Procardia 30 mg BID - Requesting medical records from [facility]  # HLD:  - Continue home Zetia 10 mg QD   * Patient is on Plavix, though unclear why. ?CVA. Per husband, patient's weakness is worse on her RLE. *  F: none  E: monitoring  N: Liquids    VTE ppx: SQ heparin  Code status: Full code , confirmed on admission    Dispo: Admit patient to Inpatient with expected length of stay greater than 2 midnights.  SignedWelford Roche, MD 10/30/2017, 4:41 PM  Pager: [phone]

## 2017-10-30 NOTE — ED Notes (Signed)
Warm therapy/bear hugger applied

## 2017-10-30 NOTE — ED Notes (Signed)
cbg 51 EDP aware

## 2017-10-30 NOTE — ED Notes (Signed)
Attempted report 

## 2017-10-30 NOTE — ED Notes (Signed)
Bear hugger removed and warm blankets applied per edp verbal order

## 2017-10-31 DIAGNOSIS — Z9114 Patient's other noncompliance with medication regimen: Secondary | ICD-10-CM

## 2017-10-31 LAB — CBC
HCT: 25.9 % — ABNORMAL LOW (ref 36.0–46.0)
HEMOGLOBIN: 8.5 g/dL — AB (ref 12.0–15.0)
MCH: 27.3 pg (ref 26.0–34.0)
MCHC: 32.8 g/dL (ref 30.0–36.0)
MCV: 83.3 fL (ref 78.0–100.0)
PLATELETS: 494 10*3/uL — AB (ref 150–400)
RBC: 3.11 MIL/uL — ABNORMAL LOW (ref 3.87–5.11)
RDW: 15.8 % — AB (ref 11.5–15.5)
WBC: 16.4 10*3/uL — ABNORMAL HIGH (ref 4.0–10.5)

## 2017-10-31 LAB — BASIC METABOLIC PANEL
Anion gap: 10 (ref 5–15)
BUN: 43 mg/dL — ABNORMAL HIGH (ref 6–20)
CALCIUM: 7.8 mg/dL — AB (ref 8.9–10.3)
CHLORIDE: 108 mmol/L (ref 101–111)
CO2: 21 mmol/L — ABNORMAL LOW (ref 22–32)
CREATININE: 2.11 mg/dL — AB (ref 0.44–1.00)
GFR calc Af Amer: 29 mL/min — ABNORMAL LOW (ref >60)
GFR, EST NON AFRICAN AMERICAN: 25 mL/min — AB (ref >60)
Glucose, Bld: 123 mg/dL — ABNORMAL HIGH (ref 65–99)
Potassium: 3.4 mmol/L — ABNORMAL LOW (ref 3.5–5.1)
SODIUM: 139 mmol/L (ref 135–145)

## 2017-10-31 LAB — GLUCOSE, CAPILLARY
GLUCOSE-CAPILLARY: 93 mg/dL (ref 65–99)
GLUCOSE-CAPILLARY: 66 mg/dL (ref 65–99)
GLUCOSE-CAPILLARY: 120 mg/dL — AB (ref 65–99)
GLUCOSE-CAPILLARY: 195 mg/dL — AB (ref 65–99)
Glucose-Capillary: 62 mg/dL — ABNORMAL LOW (ref 65–99)
Glucose-Capillary: 116 mg/dL — ABNORMAL HIGH (ref 65–99)
Glucose-Capillary: 125 mg/dL — ABNORMAL HIGH (ref 65–99)
Glucose-Capillary: 80 mg/dL (ref 65–99)
Glucose-Capillary: 121 mg/dL — ABNORMAL HIGH (ref 65–99)

## 2017-10-31 LAB — C DIFFICILE QUICK SCREEN W PCR REFLEX
C DIFFICILE (CDIFF) INTERP: DETECTED
C DIFFICILE (CDIFF) TOXIN: POSITIVE — AB
C Diff antigen: POSITIVE — AB

## 2017-10-31 MED ORDER — POTASSIUM CHLORIDE CRYS ER 20 MEQ PO TBCR: 60.0000 meq | EXTENDED_RELEASE_TABLET | Freq: Once | ORAL | Status: DC

## 2017-10-31 MED ORDER — NYSTATIN 100000 UNIT/GM EX CREA
TOPICAL_CREAM | Freq: Two times a day (BID) | CUTANEOUS | Status: DC
  Administered 2017-10-31 – 2017-11-05 (×10): via TOPICAL
  Filled 2017-10-31 (×2): qty 15

## 2017-10-31 MED ORDER — POTASSIUM CHLORIDE CRYS ER 20 MEQ PO TBCR
40.0000 meq | EXTENDED_RELEASE_TABLET | Freq: Once | ORAL | Status: AC
  Administered 2017-10-31: 40 meq via ORAL
  Filled 2017-10-31: qty 2

## 2017-10-31 MED ORDER — INSULIN ASPART 100 UNIT/ML ~~LOC~~ SOLN
0.0000 [IU] | Freq: Three times a day (TID) | SUBCUTANEOUS | Status: DC
  Administered 2017-11-01 – 2017-11-02 (×2): 3 [IU] via SUBCUTANEOUS
  Administered 2017-11-02: 1 [IU] via SUBCUTANEOUS
  Administered 2017-11-02 – 2017-11-04 (×5): 2 [IU] via SUBCUTANEOUS
  Administered 2017-11-04 – 2017-11-05 (×2): 1 [IU] via SUBCUTANEOUS

## 2017-10-31 MED ORDER — HYDROCERIN EX CREA
TOPICAL_CREAM | Freq: Two times a day (BID) | CUTANEOUS | Status: DC
  Administered 2017-10-31 – 2017-11-05 (×10): via TOPICAL
  Filled 2017-10-31: qty 113

## 2017-10-31 MED ORDER — POTASSIUM CHLORIDE CRYS ER 20 MEQ PO TBCR: 40.0000 meq | EXTENDED_RELEASE_TABLET | Freq: Two times a day (BID) | ORAL | Status: DC

## 2017-10-31 MED ORDER — VANCOMYCIN 50 MG/ML ORAL SOLUTION
125.0000 mg | Freq: Four times a day (QID) | ORAL | Status: DC
  Administered 2017-10-31 – 2017-11-01 (×5): 125 mg via ORAL
  Filled 2017-10-31 (×8): qty 2.5

## 2017-10-31 NOTE — Progress Notes (Signed)
Hypoglycemic Event  CBG: 62 (@ 0205)  Treatment: D50 IV 25 mL  Symptoms: None  Follow-up CBG: Time: 0249 CBG Result:116  Possible Reasons for Event: Unknown  Comments/MD notified: Notified [facility] via text page @ 0230 of pt's CBG 62 and that patient refused oral CHO intake. Therefore, D50 IV 72ml given per hypoglycemic protocol. Notified Harden via text page @ [phone] that patient's CBG increased to 116. No additional orders given.    [place]

## 2017-10-31 NOTE — Progress Notes (Signed)
Pt blood sugar at last check 189 spoke with physician Dr Hetty Ely  and he will review orders if changes need to be made

## 2017-10-31 NOTE — Progress Notes (Signed)
Paged Teaching service to notify of postive C-dif in stool.  Order placed for precautions

## 2017-10-31 NOTE — Progress Notes (Signed)
Hypoglycemic Event  CBG: 66 (@ 0617)  Treatment: D50 IV 25 mL  Symptoms: None  Follow-up CBG: Time: 0720 CBG Result:120  Possible Reasons for Event: Unknown  Comments/MD notified: Pt's CBG 66 @ 0617. Pt refused oral intake. Notified H[facility] of pt's CBG and pt's refusal of oral intake. 25cc D50 IV given per hypoglycemic protocol. CBG recheck was 120. No additional orders received.    [place]

## 2017-10-31 NOTE — Consult Note (Signed)
[place] Nurse wound consult note Reason for Consult: Unstageable pressure injury to coccyx, discoloration suspicious for sustained fungal overgrowth secondary to incontinence in the perineal area Wound type: Pressure plus moisture (sustained) due to fecal and urinary incontinence  Pressure Injury POA: Yes Measurement: Coccygeal area with total area of involvement measuring 4cm x 3cm iwith 2cm x 1.5cm yellow slough obscuring true wound depth. (Unstageable). Surrounding tissue is partial thickness with pink, moist wound bed. Perineal skin discoloration: deep maroon non-blanching tissue suspicious for fungal involvement into dermis.  Wound bed:As described above Drainage (amount, consistency, odor) Scant serous from autolytically debriding coccygeal pressure injury Periwound:As described above Dressing procedure/placement/frequency: I was telephonically consulted on this patient yesterday (see note from [place] dated 10/30/17) and initiated therapy with a mattress replacement with klow air loss feature and 4 times daily application of Gerhart's Butt cream (1:1:1 compounded preparation of hydrocortisone, zinc oxide and lotrimin cream) to the area of discoloration. I suggest system antifungal treatment for 7-10 days (e.g. Diflucan) to see if perineal area will improve. If not improvement, may require scraping (or punch biopsy) for definitve diagnosis by Dermatology.  If you agree, please order Diflucan. The patient's nutritional status requires supplementation and dietician oversight. If you agree, please order. Bilateral heels are at risk and to mitigate this I have added pressure redistribution heel boots.  Patient is to be turned and repositioned from side to side and miniize time in the supine position to meals, only.  [place] nursing team will not follow, but will remain available to this patient, the nursing and medical teams.  Please re-consult if needed. Thanks, Maudie Flakes, MSN, RN, [place],  Arther Abbott  Pager# [phone]

## 2017-10-31 NOTE — Progress Notes (Signed)
Physical Therapy Evaluation Patient Details Name: Laura [name] MRN: [id number] DOB: [birth year] Today's Date: 10/31/2017   History of Present Illness  [name] a 57 year old female with history of insulin-dependentT2DM, ?HFrEF (EF 40%), cognitive delay, systolic heart failure,  acute renal failure, and severe deconditioning. Admitted from [facility] where patient had limited functional mobility - now admitted on 10/30/17 due to hypogylcemia and hypothermia.   Clinical Impression  Patient admitted with the above listed diagnosis. Patient is a poor historian, therefore PLOF most gleaned from chart review and nurse. PTA, patient had been a resident of [place], with limited functional mobility and required assistance for ADLs and IADLs. Patient today requiring Min A for supine to/from sit for LE management as well as VC for hand placement and sequencing of transfer with limited carryover. Patient sitting EOB with B UE support and only supervision from PT for ~5 minutes. Attempted sit to stand, however, patient reporting dizziness and inability to follow commands, therefore transfer deferred for today. Patient to benefit from SNF/rehab facility to continue to promote and progress mobility. PT to continue to follow acutely to maximize functional mobility prior to d/c.     Follow Up Recommendations SNF;Supervision/Assistance - 24 hour    Equipment Recommendations  Other (comment)(will determine at next venue of care)    Recommendations for Other Services OT consult     Precautions / Restrictions Precautions Precautions: Fall Restrictions Weight Bearing Restrictions: No      Mobility  Bed Mobility Overal bed mobility: Needs Assistance Bed Mobility: Supine to Sit     Supine to sit: Min assist     General bed mobility comments: Min A for LE management; VC for hand placement and sequencing for overall efficiency  Transfers                 General transfer  comment: attempted sit to stand at bedside, however, patient reported dizziness and became increasingly difficult to follow commands - deferred at this time  Ambulation/Gait                Stairs            Wheelchair Mobility    Modified Rankin (Stroke Patients Only)       Balance Overall balance assessment: Needs assistance Sitting-balance support: Bilateral upper extremity supported;Feet supported Sitting balance-Leahy Scale: Poor                                       Pertinent Vitals/Pain Pain Assessment: No/denies pain    Home Living Family/patient expects to be discharged to:: Skilled nursing facility                      Prior Function Level of Independence: Needs assistance   Gait / Transfers Assistance Needed: per chart review patient with limited ambulatory status at SNF           Hand Dominance        Extremity/Trunk Assessment   Upper Extremity Assessment Upper Extremity Assessment: Defer to OT evaluation    Lower Extremity Assessment Lower Extremity Assessment: Generalized weakness    Cervical / Trunk Assessment Cervical / Trunk Assessment: Normal  Communication   Communication: No difficulties  Cognition Arousal/Alertness: Awake/alert Behavior During Therapy: WFL for tasks assessed/performed Overall Cognitive Status: No family/caregiver present to determine baseline cognitive functioning  General Comments: at times requires multiple attempts to understand questions and answer - unsure if this is baseline or not      General Comments      Exercises     Assessment/Plan    PT Assessment Patient needs continued PT services  PT Problem List Decreased strength;Decreased activity tolerance;Decreased balance;Decreased mobility;Decreased knowledge of use of DME;Decreased safety awareness       PT Treatment Interventions DME instruction;Gait  training;Functional mobility training;Therapeutic activities;Therapeutic exercise;Patient/family education    PT Goals (Current goals can be found in the Care Plan section)  Acute Rehab PT Goals Patient Stated Goal: none stated PT Goal Formulation: With patient Time For Goal Achievement: 11/14/17 Potential to Achieve Goals: Fair    Frequency Min 3X/week   Barriers to discharge        Co-evaluation               AM-PAC PT "6 Clicks" Daily Activity  Outcome Measure Difficulty turning over in bed (including adjusting bedclothes, sheets and blankets)?: Unable Difficulty moving from lying on back to sitting on the side of the bed? : Unable Difficulty sitting down on and standing up from a chair with arms (e.g., wheelchair, bedside commode, etc,.)?: Unable Help needed moving to and from a bed to chair (including a wheelchair)?: A Lot Help needed walking in hospital room?: Total Help needed climbing 3-5 steps with a railing? : Total 6 Click Score: 7    End of Session   Activity Tolerance: Patient tolerated treatment well Patient left: in bed;with call bell/phone within reach;with family/visitor present Nurse Communication: Mobility status PT Visit Diagnosis: Unsteadiness on feet (R26.81);Other abnormalities of gait and mobility (R26.89);Muscle weakness (generalized) (M62.81);Difficulty in walking, not elsewhere classified (R26.2)    Time: 2683-4196 PT Time Calculation (min) (ACUTE ONLY): 28 min   Charges:   PT Evaluation $PT Eval Moderate Complexity: 1 Mod PT Treatments $Therapeutic Activity: 8-22 mins   PT G Codes:        Lanney Gins, PT, DPT 10/31/17 4:06 PM

## 2017-10-31 NOTE — Progress Notes (Signed)
Subjective:  Patient had one episode of hypoglycemia overnight with CBG 66. She received 1 amp D50 with improvement in BG. This morning patient appears to be in better spirits. States she is feeling better and denies abdominal pain. States she feels hungry and will eat breakfast. She is alert and understands why she is in the hospital. Discussed with patient we will continue to treat her hypoglycemia and importance of good PO intake.   Objective:  Vital signs in last 24 hours: Vitals:   10/30/17 1645 10/30/17 1735 10/30/17 2134 10/31/17 0620  BP: (!) 158/96 (!) 181/81 (!) 162/92 (!) 142/80  Pulse: 99 (!) 103 (!) 103 93  Resp: 18 18 16 16   Temp:  98.4 F (36.9 C) 97.7 F (36.5 C) 98.4 F (36.9 C)  TempSrc:  Oral Oral   SpO2: 98% 96% 97% 96%  Weight:  101 lb 3.2 oz (45.9 kg)    Height:  4\' 11"  (1.499 m)     General: very pleasant female, malnourished, well-developed, sleeping in bed in no acute distress, appears more awake than yesterday  Cardiac: regular rate and rhythm, nl S1/S2, no murmurs, rubs or gallops  Pulm: appears comfortable on room air, no increased work of breathing  Abd: soft, NTND, normoactive bowel sounds Neuro: A&Ox3, no focal deficits noted   Ext: warm and well perfused, no peripheral edema    Assessment/Plan:  Active Problems:   Urinary tract infection   Pressure injury of skin  # Hypoglycemia:  # Insulin-dependent T2DM Patient was diagnosed with type 2 diabetes 20 years ago.  Her hyperglycemia has been difficult to control due to medication noncompliance.  She follows up with endocrinology who were planning to start patient on Afrezza. Her insulin regimen is unclear at this time.  Per SNF documentation, she is receiving Lantus 5 at bedtime and sliding scale insulin. She had one episode of hypoglycemia overnight requiring 1 amp of D50, refused PO intake. States she feels hungry today. Will continue to monitor CBG and PO intake throughout the day.  - CBG  monitoring q4h  - Dextrose amp if CBG <80 - Holding insulin regimen   # UTI: Patient presented with hypothermia and leukocytosis of 18.2. UA on admission with nitrites, large leukocytes, and many bacteria. Patient has had chronic indwelling foley catheter for the past ~ 2 months. Unclear how often this is exchanged or the last time it was exchanged prior to arrival to the ED. Per husband, patient lost her ability to void spontaneously 2 months ago during hospitalization at [facility]. Foley catheter exchanged 1/26 in the ED. T 98.4 and hemodynamically stable.  - Continue Rocephin (1/26-) pending Ucx  - Bcx pending   # Deconditioning # Sacral ulcer  Secondary to recent prolonged hospitalization.  Per husband, patient able to ambulate and perform ADLs independently prior to hospitalization.  Now unable to ambulate or move lower extremities. She has also developed a pressure injury in her sacrum that extends to her labia.  - PT  - SW consult  - WOC consult  - Continue Gerhardt's butt cream   # Diarrhea: Per husband, patient has been having 3-4 episodes of loose stools though unclear for how long. Does not appear she has been on antibiotic therapy recently per SNF med list. No further episodes of loose stools during admission. Stool sent for C. Diff assay.  - Follow up C. diff.   # ?CKD: Baseline creatinine unknown. Cr 2.4 today. Per husband, patient did not have a history  of kidney disease prior to November admission. K 3.4 and SCr 2.1 today. We will continue to monitor renal function and volume status.  - Kdur 38mEq x1  - Strict I/Os - We will hold home Lasix as unclear if patient has an AKI - Avoid nephrotoxic medications - Holding home modafinil (unclear indication)  # ?HFrEF: EF 40%. Will attempt to obtained records from OSH. Appears euvolemic on exam. - Strict I/Os + daily weights  - Continue home metoprolol 50 mg BID - Continue home Imdur 60 mg QD  - Continue home Procardia 30 mg  BID  # HLD:  - Continue home Zetia 10 mg QD    Dispo: Anticipated discharge in approximately 3-4 day(s).   Welford Roche, MD 10/31/2017, 6:34 AM Pager: [phone]

## 2017-11-01 DIAGNOSIS — T68XXXD Hypothermia, subsequent encounter: Secondary | ICD-10-CM

## 2017-11-01 DIAGNOSIS — R8271 Bacteriuria: Secondary | ICD-10-CM

## 2017-11-01 LAB — GLUCOSE, CAPILLARY
GLUCOSE-CAPILLARY: 111 mg/dL — AB (ref 65–99)
GLUCOSE-CAPILLARY: 219 mg/dL — AB (ref 65–99)
Glucose-Capillary: 155 mg/dL — ABNORMAL HIGH (ref 65–99)
Glucose-Capillary: 189 mg/dL — ABNORMAL HIGH (ref 65–99)
Glucose-Capillary: 119 mg/dL — ABNORMAL HIGH (ref 65–99)
Glucose-Capillary: 212 mg/dL — ABNORMAL HIGH (ref 65–99)

## 2017-11-01 LAB — URINE CULTURE

## 2017-11-01 LAB — BASIC METABOLIC PANEL
ANION GAP: 11 (ref 5–15)
BUN: 44 mg/dL — ABNORMAL HIGH (ref 6–20)
CO2: 18 mmol/L — AB (ref 22–32)
CREATININE: 2.1 mg/dL — AB (ref 0.44–1.00)
Calcium: 7.9 mg/dL — ABNORMAL LOW (ref 8.9–10.3)
Chloride: 112 mmol/L — ABNORMAL HIGH (ref 101–111)
GFR calc non Af Amer: 25 mL/min — ABNORMAL LOW (ref >60)
GFR, EST AFRICAN AMERICAN: 29 mL/min — AB (ref >60)
Glucose, Bld: 129 mg/dL — ABNORMAL HIGH (ref 65–99)
Potassium: 4.9 mmol/L (ref 3.5–5.1)
SODIUM: 141 mmol/L (ref 135–145)

## 2017-11-01 LAB — CBC
HCT: 27.2 % — ABNORMAL LOW (ref 36.0–46.0)
Hemoglobin: 8.8 g/dL — ABNORMAL LOW (ref 12.0–15.0)
MCH: 27.2 pg (ref 26.0–34.0)
MCHC: 32.4 g/dL (ref 30.0–36.0)
MCV: 84.2 fL (ref 78.0–100.0)
Platelets: 468 K/uL — ABNORMAL HIGH (ref 150–400)
RBC: 3.23 MIL/uL — ABNORMAL LOW (ref 3.87–5.11)
RDW: 16.5 % — ABNORMAL HIGH (ref 11.5–15.5)
WBC: 16 K/uL — ABNORMAL HIGH (ref 4.0–10.5)

## 2017-11-01 MED ORDER — PRO-STAT SUGAR FREE PO LIQD
30.0000 mL | Freq: Two times a day (BID) | ORAL | Status: DC
  Administered 2017-11-01 – 2017-11-05 (×9): 30 mL via ORAL
  Filled 2017-11-01 (×9): qty 30

## 2017-11-01 MED ORDER — VANCOMYCIN 50 MG/ML ORAL SOLUTION
500.0000 mg | Freq: Four times a day (QID) | ORAL | Status: DC
  Administered 2017-11-01 – 2017-11-02 (×3): 500 mg via ORAL
  Filled 2017-11-01 (×4): qty 10

## 2017-11-01 MED ORDER — ENSURE ENLIVE PO LIQD
237.0000 mL | Freq: Two times a day (BID) | ORAL | Status: DC
  Administered 2017-11-01 – 2017-11-05 (×8): 237 mL via ORAL

## 2017-11-01 NOTE — Progress Notes (Signed)
Initial Nutrition Assessment  DOCUMENTATION CODES:   Not applicable  INTERVENTION:  Discontinue Glucerna.   Ensure Enlive po BID, each supplement provides 350 kcal and 20 grams of protein  Pro-stat po BID, each supplement provides 100 kcal and 15 grams of protein  Feeding assistance with meals.  NUTRITION DIAGNOSIS:   Increased nutrient needs related to wound healing as evidenced by estimated needs.    GOAL:   Patient will meet greater than or equal to 90% of their needs    MONITOR:   PO intake, Supplement acceptance, Labs, Skin, Weight trends, I & O's  REASON FOR ASSESSMENT:   Malnutrition Screening Tool    ASSESSMENT:   57 yo female with PMH T2DM, CHF, hyperlipidemia, hypercholesterolemia, and AKI admitted from SNF on 1/26 with hypoglycemia & hypothermia secondary to C diff sepsis infection   Spoke with RN who reports pt has poor appetite, cutting up food and pushing it around plate without eating much. RN also reports pt difficulty with ambulating.   Pt reports poor PO intake due to lack of appetite from abdominal pain as well as difficulty self-feeding. Pt confirmed RN account of PO intake, "I play with my food" (instead of eating it). Pt reports eating three meals/day PTA. Eats same food at all meals due to lack of tolerance (eggs/waffles/coffee). Only consumes about half of meals. Pt also reports that she consumes less when she does not get assistance with eating. Pt reports that she occasionally consumed supplements (Glucerna) at the SNF PTA. Pt confirmed that she would try to consume supplements in hospital to improve her PO intake.  Pt reports that she thinks she is "losing weight" and may have lost weight recently, but was unable to recall her weight status or when she thought she started losing weight. Review of medical record suggests that her weight has been stable over the past year (~100lbs), but recent functional/cognitive declines increase risk of  malnutrition moving forward.  Labs and medications reviewed.    NUTRITION - FOCUSED PHYSICAL EXAM:    Most Recent Value  Orbital Region  No depletion  Upper Arm Region  No depletion  Thoracic and Lumbar Region  No depletion  Buccal Region  No depletion  Temple Region  No depletion  Clavicle Bone Region  No depletion  Clavicle and Acromion Bone Region  No depletion  Scapular Bone Region  No depletion  Dorsal Hand  No depletion  Patellar Region  Severe depletion  Anterior Thigh Region  Severe depletion  Posterior Calf Region  Moderate depletion  Edema (RD Assessment)  Mild  Hair  Reviewed  Eyes  Reviewed  Mouth  Reviewed  Skin  Reviewed  Nails  Reviewed       Diet Order:  Diet Carb Modified Fluid consistency: Thin; Room service appropriate? Yes  EDUCATION NEEDS:   No education needs have been identified at this time  Skin:  Skin Assessment: Skin Integrity Issues: Skin Integrity Issues:: Unstageable Stage II: NA Unstageable: Unstageable pressure injury to coccyx   Per review of [place] Nurse assessment.   Last BM:  1/28  Height:   Ht Readings from Last 1 Encounters:  10/30/17 4\' 11"  (1.499 m)    Weight:   Wt Readings from Last 1 Encounters:  10/30/17 101 lb 3.2 oz (45.9 kg)    Ideal Body Weight:  44.6 kg  BMI:  Body mass index is 20.44 kg/m.  Estimated Nutritional Needs:   Kcal:  1,400-1,600 kcal  Protein:  55-65 grams  Fluid:  1.4-1.6 Carlean Jews    Edmonia Lynch, MS Dietetic Intern Pager: [phone]

## 2017-11-01 NOTE — Progress Notes (Signed)
Subjective:  No acute events overnight. This morning patient continues to do well. She denies abdominal pain and reports good PO intake. No complaints this morning. Discussed with patient we will continue antibiotic therapy for colonic infection. Patient voiced understanding. All questions answered.   Objective:  Vital signs in last 24 hours: Vitals:   10/31/17 0620 10/31/17 1416 10/31/17 2201 11/01/17 0520  BP: (!) 142/80 (!) 154/87 (!) 164/95 (!) 149/99  Pulse: 93 90 (!) 105 95  Resp: 16     Temp: 98.4 F (36.9 C) 98.6 F (37 C) 99 F (37.2 C) 97.7 F (36.5 C)  TempSrc:  Oral Oral Oral  SpO2: 96% 98% 97% 99%  Weight:      Height:       General: very pleasant female, well-nourished, well-developed, in bed in no acute distress n Cardiac: regular rate and rhythm, nl S1/S2, no murmurs, rubs or gallops  Pulm: CTAB, no wheezes or crackles, no increased work of breathing  Abd: soft, NTND, bowel sounds present  Ext: warm and well perfused, no peripheral edema   Assessment/Plan:  Active Problems:   Urinary tract infection   Pressure injury of skin  # Severe CDI: Patient presented septic with hypothermia, leukocytosis, and persistent hypoglycemia. C. Diff test positive and started on PO vacomycin yesterday. Per husband, patient has been having 3-4 episodes of loose stools though unclear for how long. Does not appear she has been on antibiotic therapy recently per SNF med list. She is on a PPI.  No further episodes of loose stools during admission. Abdominal  - PO vancomycin 125 x 10 days (1/28-) --> 500 x 9 days (1-29) - Discontinue home protonix  - Discontinue bowel regimen   # Hypoglycemia:  # Insulin-dependent T2DM Patient was diagnosed with type 2 diabetes 20 years ago. Her hyperglycemia has been difficult to control due to medication noncompliance. She follows up with endocrinology who were planning to start patient on Afrezza. Her insulin regimen is unclear at this  time. Per SNF documentation, she is receiving Lantus 5 at bedtime and sliding scale insulin. Her PO intake has now improved and she was hyperglycemic throughout the day yesterday. SSI-S started. Will continue to monitor and adjust as necessary  - SSI-S + CBG monitoring    # Bacteruria: Patient presented with hypothermia and leukocytosis of 18.2. UA on admission with nitrites, large leukocytes, and many bacteria. Patient has had chronic indwelling foley catheter for the past ~ 2 months. Unclear how often this is exchanged orthe last timeitwas exchanged prior to arrival to the ED.Per husband, patient lost her ability to void spontaneously 2 months ago during hospitalization at [facility]. Foley catheter exchanged 1/26 in the ED. T 98.4 and hemodynamically stable.  - Discontinue Rocephin (1/26-) Ucx with multiple species - Bcx NGTD  # Deconditioning # Sacral ulcer  Secondary to recent prolonged hospitalization. Per husband, patient able to ambulate and perform ADLs independently prior to hospitalization. Now unable to ambulate or move lower extremities. She has also developed a pressure injury in her sacrum that extends to her labia.  - PT - SW consult  - WOC consult  - Continue Gerhardt's butt cream   # ?UXN:ATFTDDUK creatinine unknown.Cr 2.4 on admission. Per husband, patientdid not have a history of kidney disease prior to November admission. K 3.4 and SCr 2.1 today. We will continue to monitor renal function and volume status.  - Kdur 72mEq x1  - Strict I/Os - We will hold home Lasix as unclear  if patient has an AKI - Avoid nephrotoxic medications - Holding homemodafinil (unclear indication)  # ?HFrEF: EF 40%. Will attempt to obtained records from OSH. Appears euvolemic on exam. - Strict I/Os + daily weights  - Continue home metoprolol 100 mg BID - Continue home Imdur 60 mg QD  - Continue home Procardia 30 mg BID  # HLD:  - Continue home Zetia 10 mg QD    Dispo: Anticipated  discharge in approximately 1-3 day(s).   Welford Roche, MD 11/01/2017, 8:31 AM Pager: [phone]

## 2017-11-02 LAB — GLUCOSE, CAPILLARY
GLUCOSE-CAPILLARY: 143 mg/dL — AB (ref 65–99)
GLUCOSE-CAPILLARY: 147 mg/dL — AB (ref 65–99)
Glucose-Capillary: 230 mg/dL — ABNORMAL HIGH (ref 65–99)
Glucose-Capillary: 157 mg/dL — ABNORMAL HIGH (ref 65–99)

## 2017-11-02 LAB — HIV ANTIBODY (ROUTINE TESTING W REFLEX): HIV SCREEN 4TH GENERATION: NONREACTIVE

## 2017-11-02 LAB — CBC
HEMATOCRIT: 27.2 % — AB (ref 36.0–46.0)
HEMOGLOBIN: 8.8 g/dL — AB (ref 12.0–15.0)
MCH: 27.2 pg (ref 26.0–34.0)
MCHC: 32.4 g/dL (ref 30.0–36.0)
MCV: 84 fL (ref 78.0–100.0)
Platelets: 530 10*3/uL — ABNORMAL HIGH (ref 150–400)
RBC: 3.24 MIL/uL — AB (ref 3.87–5.11)
RDW: 16.3 % — ABNORMAL HIGH (ref 11.5–15.5)
WBC: 15.6 10*3/uL — ABNORMAL HIGH (ref 4.0–10.5)

## 2017-11-02 LAB — BASIC METABOLIC PANEL
Anion gap: 10 (ref 5–15)
BUN: 48 mg/dL — ABNORMAL HIGH (ref 6–20)
CHLORIDE: 110 mmol/L (ref 101–111)
CO2: 22 mmol/L (ref 22–32)
CREATININE: 2.44 mg/dL — AB (ref 0.44–1.00)
Calcium: 8.1 mg/dL — ABNORMAL LOW (ref 8.9–10.3)
GFR calc Af Amer: 24 mL/min — ABNORMAL LOW (ref >60)
GFR calc non Af Amer: 21 mL/min — ABNORMAL LOW (ref >60)
Glucose, Bld: 154 mg/dL — ABNORMAL HIGH (ref 65–99)
POTASSIUM: 4.7 mmol/L (ref 3.5–5.1)
SODIUM: 142 mmol/L (ref 135–145)

## 2017-11-02 MED ORDER — VANCOMYCIN 50 MG/ML ORAL SOLUTION
125.0000 mg | Freq: Four times a day (QID) | ORAL | Status: DC
  Administered 2017-11-02 – 2017-11-05 (×14): 125 mg via ORAL
  Filled 2017-11-02 (×15): qty 2.5

## 2017-11-02 MED ORDER — INSULIN GLARGINE 100 UNIT/ML ~~LOC~~ SOLN
5.0000 [IU] | Freq: Every day | SUBCUTANEOUS | Status: DC
  Administered 2017-11-02 – 2017-11-05 (×4): 5 [IU] via SUBCUTANEOUS
  Filled 2017-11-02 (×4): qty 0.05

## 2017-11-02 MED ORDER — SODIUM CHLORIDE 0.9 % IV SOLN
INTRAVENOUS | Status: AC
  Administered 2017-11-02: 09:00:00 via INTRAVENOUS

## 2017-11-02 NOTE — Progress Notes (Signed)
Physical Therapy Treatment Patient Details Name: Laura [name] MRN: [id number] DOB: 06-12-1961 Today's Date: 11/02/2017    History of Present Illness Dewayne Menzelis a 57 year old female with history of insulin-dependentT2DM, ?HFrEF (EF 40%), cognitive delay, systolic heart failure,  acute renal failure, and severe deconditioning. Admitted from [facility] where patient had limited functional mobility - now admitted on 10/30/17 due to hypogylcemia and hypothermia.     PT Comments    Re-evaluation performed today due to RN/family request secondary to improved patient status/medical status. Patient received sitting up at edge of bed with husband present, just finishing lunch but very pleasant and appears motivated to participate in skilled PT session. Gross LE strength appears to be generally 2/5 distally to approximately 3-/5 proximally, trunk strength approximately 3-/5 based on mild unsteadiness while seated EOB. She is able to perform  Bed mobility with min assist from therapist, and requires MaxA for sit to stand and stand-pivot transfer to bedside recliner. She was left up in the chair with chair alarm activated, all needs otherwise met this afternoon. Patient and her husband report that she was very independent at baseline PTA, and they are both motivated to work to regain her prior level of independence and mobility moving forward.    Follow Up Recommendations  CIR     Equipment Recommendations  None recommended by PT(defer to next venue )    Recommendations for Other Services Speech consult;OT consult     Precautions / Restrictions Precautions Precautions: Fall Restrictions Weight Bearing Restrictions: No    Mobility  Bed Mobility Overal bed mobility: Needs Assistance Bed Mobility: Supine to Sit     Supine to sit: Min assist     General bed mobility comments: MinA for LEs and to power up trunk at edge of bed   Transfers Overall transfer level: Needs  assistance Equipment used: None Transfers: Sit to/from [employer] Transfers Sit to Stand: Max assist         General transfer comment: performed via face to face transfer, knees blocked B; knees tend to buckle and patient is reliant on support from therapist, unable to take steps   Ambulation/Gait             General Gait Details: unable    Stairs            Wheelchair Mobility    Modified Rankin (Stroke Patients Only)       Balance Overall balance assessment: Needs assistance Sitting-balance support: Bilateral upper extremity supported;Feet supported Sitting balance-Leahy Scale: Poor Sitting balance - Comments: mild trunk unsteadiness, able to maintain midline sitting with close min guard and cues    Standing balance support: During functional activity Standing balance-Leahy Scale: Poor Standing balance comment: reliant on support from therapist                             Cognition Arousal/Alertness: Awake/alert Behavior During Therapy: WFL for tasks assessed/performed Overall Cognitive Status: Impaired/Different from baseline Area of Impairment: Safety/judgement;Problem solving                         Safety/Judgement: Decreased awareness of safety   Problem Solving: Requires verbal cues;Requires tactile cues;Difficulty sequencing General Comments: requires multiple tactile cues for safe transfers, generally able to follow simple cues/commands       Exercises      General Comments        Pertinent Vitals/Pain Pain Assessment: No/denies  pain    Home Living                      Prior Function            PT Goals (current goals can now be found in the care plan section) Acute Rehab PT Goals Patient Stated Goal: to got to CIR  PT Goal Formulation: With patient Time For Goal Achievement: 11/16/17 Potential to Achieve Goals: Fair Progress towards PT goals: Progressing toward goals    Frequency     Min 3X/week      PT Plan Discharge plan needs to be updated;Frequency needs to be updated    Co-evaluation              AM-PAC PT "6 Clicks" Daily Activity  Outcome Measure  Difficulty turning over in bed (including adjusting bedclothes, sheets and blankets)?: Unable Difficulty moving from lying on back to sitting on the side of the bed? : Unable Difficulty sitting down on and standing up from a chair with arms (e.g., wheelchair, bedside commode, etc,.)?: Unable Help needed moving to and from a bed to chair (including a wheelchair)?: A Lot Help needed walking in hospital room?: Total Help needed climbing 3-5 steps with a railing? : Total 6 Click Score: 7    End of Session Equipment Utilized During Treatment: Gait belt Activity Tolerance: Patient tolerated treatment well Patient left: in chair;with chair alarm set;with call bell/phone within reach Nurse Communication: Mobility status;Other (comment)(CNA educated regarding safety for transfer back to bed ) PT Visit Diagnosis: Unsteadiness on feet (R26.81);Other abnormalities of gait and mobility (R26.89);Muscle weakness (generalized) (M62.81);Difficulty in walking, not elsewhere classified (R26.2)     Time: 3073-5430 PT Time Calculation (min) (ACUTE ONLY): 38 min  Charges:  $Therapeutic Activity: 23-37 mins                    G Codes:       Deniece Ree PT, DPT, CBIS  Supplemental Physical Therapist [place]   Pager [phone]

## 2017-11-02 NOTE — Progress Notes (Signed)
Subjective:  No acute events overnight. Patient had 2 BMs. This morning she reports good appetite and denies abdominal pain, nausea, and vomiting. She has had 1 BM this morning. Discuss with patient we will continue to treat C diff infection and work on plan for discharge to SNF. All questions answered.   Objective:  Vital signs in last 24 hours: Vitals:   11/01/17 2138 11/01/17 2140 11/02/17 0500 11/02/17 0601  BP: (!) 152/92 (!) 152/92  (!) 157/89  Pulse: (!) 110 (!) 110  98  Resp: 18   16  Temp: 98.6 F (37 C)   98.4 F (36.9 C)  TempSrc: Oral     SpO2: 97%   97%  Weight:   101 lb 8 oz (46 kg)   Height:        General: Pleasant female, well-nourished, well-developed, lying in bed in no acute distress Cardiac: regular rate and rhythm, nl S1/S2, no murmurs, rubs or gallops  Pulm: CTAB, no wheezes or crackles, no increased work of breathing  Abd: soft, NTND, bowel sounds present  Ext: warm and well perfused, no peripheral edema   Assessment/Plan:  Active Problems:   Urinary tract infection   Pressure injury of skin  # Severe CDI: Patient presented septic with hypothermia, leukocytosis, and persistent hypoglycemia. C. Diff test positive and started on PO vancomycin. She has remained afebrile and hemodynamically stable.  Her abdominal pain is now resolved and her appetite continues to improve.  Had 2 episodes of diarrhea yesterday and one this morning. Bcx NGTD. We will continue to monitor.  Will be discharged to SNF. Will follow up with SW.  - PO vancomycin 125 x 10 days (1/27-) --> 500 x 9 days (1-28)--> 125 q6h 1/29x 8 days - SW consult placed   # Hypoglycemia:  # Insulin-dependent T2DM Patient was diagnosed with type 2 diabetes 20 years ago. Her hyperglycemia has been difficult to control due to medication noncompliance.She follows up with endocrinology who were planning to start patient on Afrezza.Her insulin regimen is unclear. Per SNF documentation, she is  receiving Lantus 5 at bedtime and SSI.Her PO intake continues to improve and CBG in 200s. Will add Lantus today and continue SSI.  - Start Lantus 5 units QHS - SSI-S + CBG monitoring    # Bacteruria:Patient presented with hypothermia and leukocytosis of 18.2.UA on admission with nitrites, large leukocytes, and many bacteria.Patient has had chronic indwelling foley catheter for the past ~ 2 months. Unclear how often this is exchanged orthe last timeitwas exchangedprior to arrival to the ED.Per husband, patient lost her ability to void spontaneously 2 months ago during hospitalization at [facility].Foley catheter exchanged 1/26 in the ED. Rocephin discontinued yesterday as the etiology of sepsis is though to be severe CDI and not UTI. Ucx with multiple species.   # Deconditioning # Sacral ulcer Secondary to recent prolonged hospitalization. Per husband, patient able to ambulate and perform ADLs independently prior to hospitalization. Now unable to ambulate or move lower extremities.She has also a pressure injury in her sacrum that extends to her labia. - PT - SW consult - Continue Gerhardt's butt cream   # ?TSV:XBLTJQZE creatinine unknown.Cr 2.4 on admission. Per husband, patientdid not have a history of kidney disease prior to November 2018 admission. K 4.7 and SCr 2.4 today. We will continue to monitor renal function and volume status. Will do gentle IV hydration given history of HF.   - NS @ 50cc/hr x 12h - Strict I/Os - Holding home  Lasix  - Avoid nephrotoxic medications -Holding homemodafinil (unclear indication)  # ?HFrEF:EF 40%.Attempting to obtain records from OSH.Appears dry/euvolemic on exam. - Strict I/Os + daily weights  - Continue home metoprolol 100 mg BID - Continue home Imdur 60 mg QD  - Continue home Procardia 30 mg BID  # HLD:  - Continue home Zetia 10 mg QD   Dispo: Anticipated discharge in approximately 2-3 day(s).   Welford Roche,  MD 11/02/2017, 8:45 AM Pager: [phone]

## 2017-11-02 NOTE — Progress Notes (Signed)
PT Cancellation Note  Patient Details Name: Kylinn Shropshire MRN: [id number] DOB: 04-21-1961   Cancelled Treatment:    Reason Eval/Treat Not Completed: Other (comment) Patient eating lunch, plan to attempt to return as time allows.    Deniece Ree PT, DPT, CBIS  Supplemental Physical Therapist [facility]   Pager [phone]

## 2017-11-02 NOTE — NC FL2 (Addendum)
[place] LEVEL OF CARE SCREENING TOOL     IDENTIFICATION  Patient Name: Laura [name] Birthdate: 09-Sep-1961 Sex: female Admission Date (Current Location): 10/30/2017  [facility] and [place] Number:  Herbalist and Address:  [facility] [place] [address] [place], [place] [address]      Provider Number: [id number]  Attending Physician Name and Address:  Aldine Contes, MD  Relative Name and Phone Number:   Roderick Sweezy [phone]    Current Level of Care: Hospital Recommended Level of Care: [place] Prior Approval Number:    Date Approved/Denied:   PASRR Number: [id number]  Discharge Plan: SNF    Current Diagnoses: Patient Active Problem List   Diagnosis Date Noted  . Urinary tract infection 10/30/2017  . Pressure injury of skin 10/30/2017    Orientation RESPIRATION BLADDER Height & Weight   Self, Time, Situation, Place Normal Incontinent, Indwelling catheter Weight: 101 lb 8 oz (46 kg) Height:  4\' 11"  (149.9 cm)  BEHAVIORAL SYMPTOMS/MOOD NEUROLOGICAL BOWEL NUTRITION STATUS      Incontinent Diet(see discharge summary); Carb Modified Fluid consistency: Thin    AMBULATORY STATUS COMMUNICATION OF NEEDS Skin   Total Care Verbally PU Stage and Appropriate Care, Other (Comment)   PU Stage 2 Dressing: (sacrum; foam dressing)                   Personal Care Assistance Level of Assistance  Bathing, Feeding, Dressing Bathing Assistance: Maximum assistance Feeding Assistance: Minimum assistance Dressing Assistance: Maximum assistance     Functional Limitations Info  Sight, Hearing, Speech Sight Info: Adequate Hearing Info: Adequate Speech Info: Adequate(delayed)    SPECIAL CARE FACTORS FREQUENCY  PT (By licensed PT), OT (By licensed OT)     PT Frequency: 5x week OT Frequency: 5x week            Contractures Contractures Info: Not present    Additional Factors Info  Code Status,  Allergies, Isolation Precautions Code Status Info: Full Code Allergies Info: Aspirin     Isolation Precautions Info: Enteric     Current Medications (11/02/2017):  This is the current hospital active medication list Current Facility-Administered Medications  Medication Dose Route Frequency Provider Last Rate Last Dose  . 0.9 %  sodium chloride infusion   Intravenous Continuous Ledell Noss, MD 50 mL/hr at 11/02/17 [phone]    . clopidogrel (PLAVIX) tablet 75 mg  75 mg Oral Daily Ledell Noss, MD   75 mg at 11/02/17 [phone]  . dextrose 50 % solution 50 mL  1 ampule Intravenous BID PRN Ledell Noss, MD   25 mL at 10/31/17 0175  . ezetimibe (ZETIA) tablet 10 mg  10 mg Oral Daily Ledell Noss, MD   10 mg at 11/02/17 1025  . feeding supplement (ENSURE ENLIVE) (ENSURE ENLIVE) liquid 237 mL  237 mL Oral BID BM Axel Filler, MD   237 mL at 11/02/17 0939  . feeding supplement (PRO-STAT SUGAR FREE 64) liquid 30 mL  30 mL Oral BID Axel Filler, MD   30 mL at 11/02/17 0937  . Gerhardt's butt cream   Topical QID Axel Filler, MD      . heparin injection 5,000 Units  5,000 Units Subcutaneous Q8H Ledell Noss, MD   5,000 Units at 11/02/17 [phone]  . hydrocerin (EUCERIN) cream   Topical BID Axel Filler, MD      . insulin aspart (novoLOG) injection 0-9 Units  0-9 Units Subcutaneous TID  WC Ina Homes, MD   1 Units at 11/02/17 [phone]  . insulin glargine (LANTUS) injection 5 Units  5 Units Subcutaneous Daily Ledell Noss, MD   5 Units at 11/02/17 [phone]  . isosorbide mononitrate (IMDUR) 24 hr tablet 60 mg  60 mg Oral Daily Ledell Noss, MD   60 mg at 11/02/17 7262  . metoprolol tartrate (LOPRESSOR) tablet 50 mg  50 mg Oral BID Ledell Noss, MD   50 mg at 11/02/17 0355  . mirtazapine (REMERON) tablet 15 mg  15 mg Oral QHS Ledell Noss, MD   15 mg at 11/01/17 2141  . NIFEdipine (PROCARDIA-XL/ADALAT-CC/NIFEDICAL-XL) 24 hr tablet 30 mg  30 mg Oral BID Ledell Noss, MD   30 mg at 11/02/17 9741  . nystatin  cream (MYCOSTATIN)   Topical BID Santos-Sanchez, Merlene Morse, MD      . vancomycin (VANCOCIN) 50 mg/mL oral solution 125 mg  125 mg Oral Q6H Santos-Sanchez, Idalys, MD      . venlafaxine XR (EFFEXOR-XR) 24 hr capsule 150 mg  150 mg Oral Q breakfast Ledell Noss, MD   150 mg at 11/02/17 6384     Discharge Medications: Please see discharge summary for a list of discharge medications.  Relevant Imaging Results:  Relevant Lab Results:   Additional Information SS# [place], [place]

## 2017-11-02 NOTE — Progress Notes (Signed)
Physical medicine rehabilitation consult was requested chart reviewed. Patient currently a resident of [place] skilled nursing facility and documentation patient to return back to skilled nursing facility. Hold on formal rehabilitation consult at this time with plan to return back to skilled nursing facility.

## 2017-11-02 NOTE — Clinical Social Work Note (Signed)
Clinical Social Work Assessment  Patient Details  Name: Laura [name] MRN: [id number] Date of Birth: 26-Jun-1961  Date of referral:  11/02/17               Reason for consult:  Facility Placement, Discharge Planning                Permission sought to share information with:  Facility Sport and exercise psychologist, Family Supports Permission granted to share information::  No(pt has legal guardian)  Name::     Verlie Liotta  Agency::     Relationship::  husband  Contact Information:  [phone]  Housing/Transportation Living arrangements for the past 2 months:  [place], Single Family Home Source of Information:  Guardian, Spouse Patient Interpreter Needed:  None Criminal Activity/Legal Involvement Pertinent to Current Situation/Hospitalization:  No - Comment as needed Significant Relationships:  Spouse Lives with:  Facility Resident Do you feel safe going back to the place where you live?  Yes Need for family participation in patient care:  Yes (Comment)(decision making; physical care)  Care giving concerns:  Pt requires support with decision making and mobility. Pt husband provides care but states that he works second shift and is unable to care for pt at home by himself without supportive help.   SNF is recommended for continued care.    Social Worker assessment / plan:  CSW spoke with pt husband on phone at request of medical care provider, because although pt is oriented there are cognitively delays that may prevent a full assessment.   Pt has been receiving care at [facility] and pt husband voices that he would like them to have other options prior to discharge. Pt husband also inquired regarding whether or not they could have CIR consult or in home help. CSW discussed why no CIR consult had been recommended and why SNF was being discussed. Pt husband states that previous to [facility], pt was living at home with him in [place]. Pt husband works second shift  and does not feel that he can adequately care for pt at home by himself without significant care. CSW will explore other SNF options and continue to support pt and pt husband with discharge planning.   Employment status:  Disabled (Comment on whether or not currently receiving Disability) Insurance information:  Engineer, petroleum) PT Recommendations:  [place] / Referral to community resources:  [place]  Patient/Family's Response to care:  Pt husband states understanding of CSW role and SNF recommendation.   Patient/Family's Understanding of and Emotional Response to Diagnosis, Current Treatment, and Prognosis:  Pt husband understands diagnosis, current treatment and prognosis. Pt husband states some difficulty deciding what is best for pt care and what he can provide himself. CSW supported pt husband with active listening and will continue to support pt and pt family.   Emotional Assessment Appearance:    Attitude/Demeanor/Rapport:  Unable to Assess Affect (typically observed):  Unable to Assess Orientation:  Oriented to Self, Oriented to Place, Oriented to  Time, Oriented to Situation(cognitively delayed) Alcohol / Substance use:  Not Applicable Psych involvement (Current and /or in the community):  No (Comment)  Discharge Needs  Concerns to be addressed:  Discharge Planning Concerns, Care Coordination, Decision making concerns Readmission within the last 30 days:  No Current discharge risk:  Physical Impairment, Cognitively Impaired, Dependent with Mobility Barriers to Discharge:  Ship broker, Continued Medical Work up   [employer], [place] 11/02/2017, 11:35 AM

## 2017-11-03 DIAGNOSIS — R7989 Other specified abnormal findings of blood chemistry: Secondary | ICD-10-CM

## 2017-11-03 DIAGNOSIS — R4189 Other symptoms and signs involving cognitive functions and awareness: Secondary | ICD-10-CM

## 2017-11-03 DIAGNOSIS — D72829 Elevated white blood cell count, unspecified: Secondary | ICD-10-CM

## 2017-11-03 DIAGNOSIS — D62 Acute posthemorrhagic anemia: Secondary | ICD-10-CM

## 2017-11-03 DIAGNOSIS — E119 Type 2 diabetes mellitus without complications: Secondary | ICD-10-CM

## 2017-11-03 DIAGNOSIS — I10 Essential (primary) hypertension: Secondary | ICD-10-CM

## 2017-11-03 DIAGNOSIS — I5032 Chronic diastolic (congestive) heart failure: Secondary | ICD-10-CM

## 2017-11-03 DIAGNOSIS — A0472 Enterocolitis due to Clostridium difficile, not specified as recurrent: Secondary | ICD-10-CM

## 2017-11-03 DIAGNOSIS — D638 Anemia in other chronic diseases classified elsewhere: Secondary | ICD-10-CM

## 2017-11-03 DIAGNOSIS — E46 Unspecified protein-calorie malnutrition: Secondary | ICD-10-CM

## 2017-11-03 DIAGNOSIS — N184 Chronic kidney disease, stage 4 (severe): Secondary | ICD-10-CM

## 2017-11-03 DIAGNOSIS — L89159 Pressure ulcer of sacral region, unspecified stage: Secondary | ICD-10-CM

## 2017-11-03 DIAGNOSIS — E785 Hyperlipidemia, unspecified: Secondary | ICD-10-CM

## 2017-11-03 DIAGNOSIS — D649 Anemia, unspecified: Secondary | ICD-10-CM

## 2017-11-03 LAB — CBC
HCT: 25.9 % — ABNORMAL LOW (ref 36.0–46.0)
Hemoglobin: 8.3 g/dL — ABNORMAL LOW (ref 12.0–15.0)
MCH: 27 pg (ref 26.0–34.0)
MCHC: 32 g/dL (ref 30.0–36.0)
MCV: 84.4 fL (ref 78.0–100.0)
PLATELETS: 526 10*3/uL — AB (ref 150–400)
RBC: 3.07 MIL/uL — ABNORMAL LOW (ref 3.87–5.11)
RDW: 16.6 % — AB (ref 11.5–15.5)
WBC: 14.3 10*3/uL — ABNORMAL HIGH (ref 4.0–10.5)

## 2017-11-03 LAB — BASIC METABOLIC PANEL
Anion gap: 8 (ref 5–15)
BUN: 50 mg/dL — AB (ref 6–20)
CALCIUM: 8 mg/dL — AB (ref 8.9–10.3)
CO2: 21 mmol/L — ABNORMAL LOW (ref 22–32)
Chloride: 112 mmol/L — ABNORMAL HIGH (ref 101–111)
Creatinine, Ser: 2.5 mg/dL — ABNORMAL HIGH (ref 0.44–1.00)
GFR calc Af Amer: 24 mL/min — ABNORMAL LOW (ref >60)
GFR, EST NON AFRICAN AMERICAN: 20 mL/min — AB (ref >60)
GLUCOSE: 134 mg/dL — AB (ref 65–99)
Potassium: 4.7 mmol/L (ref 3.5–5.1)
Sodium: 141 mmol/L (ref 135–145)

## 2017-11-03 LAB — GLUCOSE, CAPILLARY
GLUCOSE-CAPILLARY: 167 mg/dL — AB (ref 65–99)
Glucose-Capillary: 115 mg/dL — ABNORMAL HIGH (ref 65–99)
Glucose-Capillary: 122 mg/dL — ABNORMAL HIGH (ref 65–99)
Glucose-Capillary: 135 mg/dL — ABNORMAL HIGH (ref 65–99)

## 2017-11-03 MED ORDER — SODIUM CHLORIDE 0.9 % IV SOLN
INTRAVENOUS | Status: AC
  Administered 2017-11-03: 18:00:00 via INTRAVENOUS

## 2017-11-03 NOTE — Progress Notes (Signed)
Subjective:  No acute events overnight. Patient has 3 episodes of diarrhea yesterday. She reports feeling well this morning. She denies abdominal pain and PO intake continues to improve. No episodes of diarrhea today. Discuss with patient will continue to treat C diff colitis and continue to work with SW to determine disposition. Patient voiced understanding and is in agreement with plan. All questions answered.   Objective:  Vital signs in last 24 hours: Vitals:   11/02/17 2123 11/02/17 2130 11/03/17 0500 11/03/17 0528  BP: (!) 164/99 (!) 164/99  (!) 151/87  Pulse: 85 85  97  Resp: 18   20  Temp: 98.2 F (36.8 C)   98.6 F (37 C)  TempSrc: Oral   Oral  SpO2: 100%   97%  Weight:   171 lb (77.6 kg)   Height:       Physical Exam  Constitutional: She is oriented to person, place, and time. She appears well-developed. She is cooperative.  Malnourished  Cardiovascular: Normal rate, regular rhythm, S1 normal and S2 normal. Exam reveals no gallop and no friction rub.  No murmur heard. No JVD noted   Pulmonary/Chest:  CTAB, no wheezes or crackles, no increased work of breathing on room air   Abdominal: Soft. Normal appearance. She exhibits no distension. Bowel sounds are increased. There is no tenderness.  Neurological: She is alert and oriented to person, place, and time.  2/5 strength on bilateral LE     Assessment/Plan:  Active Problems:   Urinary tract infection   Pressure injury of skin  #Severe SWF:UXNATFT presented septic with hypothermia, leukocytosis, and persistent hypoglycemia. C. Diff test positive and started on PO vancomycin. She has remained afebrile and hemodynamically stable.  Her abdominal pain is now resolved and her appetite continues to improve.  Had 3 episodes of diarrhea yesterday.Bcx NGTD. We will continue to monitor.    -PO vancomycin 125 x 10 days (1/27-)--> 500 x 9 days (1-28)--> 125 q6h 1/29x 8 days. Will continue treatment for 7 more days.  -  Follow-up SW consult   - Follow up CIR   # Hypoglycemia:  # Insulin-dependent T2DM Patient was diagnosed with type 2 diabetes 20 years ago. Her hyperglycemia has been difficult to control due to medication noncompliance.She follows up with endocrinology who were planning to start patient on Afrezza.Her insulin regimen is unclear. Per SNF documentation, she is receiving Lantus 5 at bedtime and SSI.CBGs 115-147 after starting long-acting insulin yesterday. Will continue current regimen.  - Start Lantus 5 units QHS -SSI-S +CBG monitoring   # Deconditioning # Sacral ulcer Secondary to recent prolonged hospitalization. Per husband, patient able to ambulate and perform ADLs independently prior to hospitalization. Now unable to ambulate or move lower extremities.She has also a pressure injury in her sacrum that extends to her labia. - PT - Follow up CIR - Continue Gerhardt's butt cream   # ?DDU:KGURKYHC creatinine unknown.Cr 2.4on admission. Per husband, patientdid not have a history of kidney disease prior to November 2018 admission. K 4.7 and SCr 2.5 today. We will continue to monitor renal function and volume status in the setting of IV hydration and ?history of HF. Will continue gentle hydration as below given mild worsening in renal function.  - NS @ 75cc/hr x 12h  - Strict I/Os - Holding home Lasix  - Avoid nephrotoxic medications -Holding homemodafinil (unclear indication)  # ?HFrEF:EF 40%.Attempting to obtain records from OSH.Appears euvolemic on exam. Will continue to monitor volume status in the setting of  IV hydration as above. - Strict I/Os + daily weights  - Continue home metoprolol100 mg BID - Continue home Imdur 60 mg QD  - Continue home Procardia 30 mg BID  #Bacteruria:Patient presented with hypothermia and leukocytosis of 18.2.UA on admission with nitrites, large leukocytes, and many bacteria.Patient has had chronic indwelling foley catheter  for the past ~ 2 months. Unclear how often this is exchanged orthe last timeitwas exchangedprior to arrival to the ED.Per husband, patient lost her ability to void spontaneously 2 months ago during hospitalization at [facility].Foley catheter exchanged 1/26 in the ED. Rocephin discontinued as the etiology of sepsis is though to be severe CDI and not UTI. Ucx with multiple species.   # HLD:  - Continue home Zetia 10 mg QD   Dispo: Anticipated discharge in approximately 2-5 day(s).   Welford Roche, MD 11/03/2017, 7:51 AM Pager: [phone]

## 2017-11-03 NOTE — Consult Note (Addendum)
Physical Medicine and Rehabilitation Consult Reason for Consult:decreased functional mobility Referring Physician: Triad   HPI: Laura [name] is a 57 y.o.right handed female with history of type 2 diabetes mellitus,cognitive impairment, hyperlipidemia, hypertension, diastolic congestive heart failure with history of admission to [facility] November 7824 for systolic heart failure and was discharged to [facility] skilled nursing facility. History taken from chart review. Patient also that time found to be in acute renal failure needing several sessions of hemodialysis. Presented 10/30/2017 from skilled nursing facility with hypoglycemia and lethargy as well as bouts of loose stool. WBC 18,700, creatinine 2.43, BUN 52. Blood cultures no growth. Urine culture multiple species. Lactic acid within normal limits at 0.52. Chest x-ray showed bilateral effusions with dependent atelectasis. C. Difficile specimen was positive and currently maintained on a 10 day course of oral vancomycin. WOC follow-up for unstageable pressure injury to coccyx is with wound care as directed. Subcutaneous heparin for DVT prophylaxis. WBC has improved to 14,300 and latest creatinine 2.44. Physical therapy continues to provide assistance for patient with latest progress note 11/02/2017 with recommendations for physical medicine rehabilitation consult as family had requested discharge to home if at all possible and not return back to skilled nursing facility.  Review of Systems  Constitutional: Negative for chills.  HENT: Negative for hearing loss.   Eyes: Negative for blurred vision.  Respiratory: Negative for cough and shortness of breath.   Cardiovascular: Negative for chest pain and palpitations.  Gastrointestinal: Positive for diarrhea and nausea.  Genitourinary: Negative for dysuria and hematuria.  Musculoskeletal: Positive for joint pain and myalgias.  Skin: Negative for rash.  Neurological:  Positive for weakness. Negative for seizures.  All other systems reviewed and are negative.  Past Medical History:  Diagnosis Date  . Diabetes mellitus without complication ([place])    No past surgical history. No pertinent family history of C-diff. Social History:  reports that  has never smoked. She does not have any smokeless tobacco history on file. She reports that she does not drink alcohol or use drugs. Allergies:  Allergies  Allergen Reactions  . Aspirin Nausea Only   Medications Prior to Admission  Medication Sig Dispense Refill  . acetaminophen (TYLENOL) 325 MG tablet Take 650 mg by mouth every 4 (four) hours as needed for mild pain.    Marland Kitchen aluminum-magnesium hydroxide-simethicone (MAALOX) 235-361-44 MG/5ML SUSP Take 60 mLs by mouth 4 (four) times daily -  before meals and at bedtime.    . bisacodyl (DULCOLAX) 5 MG EC tablet Take 10 mg by mouth daily as needed for moderate constipation.    . clopidogrel (PLAVIX) 75 MG tablet Take 75 mg by mouth daily.    Marland Kitchen ezetimibe (ZETIA) 10 MG tablet Take 10 mg by mouth daily.    . furosemide (LASIX) 20 MG tablet Take 20 mg by mouth daily.    . insulin aspart (NOVOLOG) 100 UNIT/ML injection Inject 0-10 Units into the skin See admin instructions. If blood sugar is <70 call MD, 71-250=0, 251-300=2u, 301-350=4,351-400=6u, 401-500=8u, >500=10u and call MD    . insulin glargine (LANTUS) 100 UNIT/ML injection Inject 5 Units into the skin at bedtime.    . isosorbide mononitrate (IMDUR) 60 MG 24 hr tablet Take 60 mg by mouth daily.    . metoprolol tartrate (LOPRESSOR) 50 MG tablet Take 50 mg by mouth 2 (two) times daily.    . mirtazapine (REMERON) 15 MG tablet Take 15 mg by mouth at bedtime.    Marland Kitchen  modafinil (PROVIGIL) 200 MG tablet Take 200 mg by mouth daily.    Marland Kitchen NIFEdipine (PROCARDIA-XL/ADALAT-CC/NIFEDICAL-XL) 30 MG 24 hr tablet Take 30 mg by mouth 2 (two) times daily.    . pantoprazole (PROTONIX) 40 MG tablet Take 40 mg by mouth daily.    Marland Kitchen  PRESCRIPTION MEDICATION Take 120 mLs by mouth 2 (two) times daily. Med Pass    . simethicone (MYLICON) 80 MG chewable tablet Chew 80 mg by mouth 4 (four) times daily as needed for flatulence.    . venlafaxine XR (EFFEXOR-XR) 150 MG 24 hr capsule Take 150 mg by mouth daily with breakfast.    . venlafaxine XR (EFFEXOR-XR) 75 MG 24 hr capsule Take 75 mg by mouth daily with breakfast.      Home: Home Living Family/patient expects to be discharged to:: Skilled nursing facility  Functional History: Prior Function Level of Independence: Needs assistance Gait / Transfers Assistance Needed: per chart review patient with limited ambulatory status at SNF Functional Status:  Mobility: Bed Mobility Overal bed mobility: Needs Assistance Bed Mobility: Supine to Sit Supine to sit: Min assist General bed mobility comments: MinA for LEs and to power up trunk at edge of bed  Transfers Overall transfer level: Needs assistance Equipment used: None Transfers: Sit to/from Stand, Stand Pivot Transfers Sit to Stand: Max assist General transfer comment: performed via face to face transfer, knees blocked B; knees tend to buckle and patient is reliant on support from therapist, unable to take steps  Ambulation/Gait General Gait Details: unable     ADL:    Cognition: Cognition Overall Cognitive Status: Impaired/Different from baseline Orientation Level: Oriented X4 Cognition Arousal/Alertness: Awake/alert Behavior During Therapy: WFL for tasks assessed/performed Overall Cognitive Status: Impaired/Different from baseline Area of Impairment: Safety/judgement, Problem solving Safety/Judgement: Decreased awareness of safety Problem Solving: Requires verbal cues, Requires tactile cues, Difficulty sequencing General Comments: requires multiple tactile cues for safe transfers, generally able to follow simple cues/commands   Blood pressure (!) 151/87, pulse 97, temperature 98.6 F (37 C), temperature  source Oral, resp. rate 20, height 4\' 11"  (1.499 m), weight 77.6 kg (171 lb), SpO2 97 %. Physical Exam  Vitals reviewed. Constitutional: She appears well-nourished.  Small frame  HENT:  Head: Normocephalic and atraumatic.  Eyes: EOM are normal. Right eye exhibits no discharge. Left eye exhibits no discharge.  Pupils reactive to light  Neck: Normal range of motion. Neck supple. No thyromegaly present.  Cardiovascular: Normal rate and regular rhythm.  Respiratory: Effort normal and breath sounds normal. No respiratory distress.  GI: Soft. Bowel sounds are normal. She exhibits no distension.  Musculoskeletal:  No edema or tenderness in extremities  Neurological: She is alert.  Limited by language Mood is very flat.  She was able to provide her name and age as well as place.  Poor medical historian.  Follow simple commands. Motor: B/l UE 4/5 proximal to distal RLE: HF, KE, ADF/PF 2 /5 LLE: HF, KE 2/5, ADF/PF 3/5  Skin:  Coccyx skin ulcer not examined as patient could not be turned without assistance  Psychiatric:  Limited interaction due to language    Results for orders placed or performed during the hospital encounter of 10/30/17 (from the past 24 hour(s))  Glucose, capillary     Status: Abnormal   Collection Time: 11/02/17  7:56 AM  Result Value Ref Range   Glucose-Capillary 143 (H) 65 - 99 mg/dL   Comment 1 Notify RN   Glucose, capillary     Status: Abnormal  Collection Time: 11/02/17 12:01 PM  Result Value Ref Range   Glucose-Capillary 230 (H) 65 - 99 mg/dL   Comment 1 Notify RN   Glucose, capillary     Status: Abnormal   Collection Time: 11/02/17  5:01 PM  Result Value Ref Range   Glucose-Capillary 157 (H) 65 - 99 mg/dL   Comment 1 Notify RN   Glucose, capillary     Status: Abnormal   Collection Time: 11/02/17  9:28 PM  Result Value Ref Range   Glucose-Capillary 147 (H) 65 - 99 mg/dL  CBC     Status: Abnormal   Collection Time: 11/03/17  4:47 AM  Result Value  Ref Range   WBC 14.3 (H) 4.0 - 10.5 K/uL   RBC 3.07 (L) 3.87 - 5.11 MIL/uL   Hemoglobin 8.3 (L) 12.0 - 15.0 g/dL   HCT 25.9 (L) 36.0 - 46.0 %   MCV 84.4 78.0 - 100.0 fL   MCH 27.0 26.0 - 34.0 pg   MCHC 32.0 30.0 - 36.0 g/dL   RDW 16.6 (H) 11.5 - 15.5 %   Platelets 526 (H) 150 - 400 K/uL   No results found.  Assessment/Plan: Diagnosis: Debility Labs independently reviewed.  Records reviewed and summated above.  1. Does the need for close, 24 hr/day medical supervision in concert with the patient's rehab needs make it unreasonable for this patient to be served in a less intensive setting? Yes  2. Co-Morbidities requiring supervision/potential complications: type 2 diabetes mellitus (Monitor in accordance with exercise and adjust meds as necessary), cognitive impairment, hyperlipidemia (cont meds), HTN (monitor and provide prns in accordance with increased physical exertion and pain), diastolic congestive heart failure (monitor for signs/symptoms of fluid overload), C. Difficile (cont abx), CKD (avoid nephrotoxic meds), leukocytosis (cont to monitor for signs and symptoms of infection, further workup if indicated), ABLA on anemia of chronic disease (transfuse if necessary to ensure appropriate perfusion for increased activity tolerance) 3. Due to bowel management, safety, skin/wound care, disease management, medication administration and patient education, does the patient require 24 hr/day rehab nursing? Yes 4. Does the patient require coordinated care of a physician, rehab nurse, PT (1-2 hrs/day, 5 days/week), OT (1-2 hrs/day, 5 days/week) and SLP (1-2 hrs/day, 5 days/week) to address physical and functional deficits in the context of the above medical diagnosis(es)? Yes Addressing deficits in the following areas: balance, endurance, locomotion, strength, transferring, bathing, dressing, toileting, cognition and psychosocial support 5. Can the patient actively participate in an intensive therapy  program of at least 3 hrs of therapy per day at least 5 days per week? Potentially 6. The potential for patient to make measurable gains while on inpatient rehab is excellent 7. Anticipated functional outcomes upon discharge from inpatient rehab are min assist and mod assist  with PT, min assist and mod assist with OT, supervision and min assist with SLP. 8. Estimated rehab length of stay to reach the above functional goals is: 18-23 days. 9. Anticipated D/C setting: Home 10. Anticipated post D/C treatments: HH therapy and Home excercise program 11. Overall Rehab/Functional Prognosis: good  RECOMMENDATIONS: This patient's condition is appropriate for continued rehabilitative care in the following setting: Consider workup up for LE weakness.  CIR. Patient has agreed to participate in recommended program. Potentially Note that insurance prior authorization may be required for reimbursement for recommended care.  Comment: Rehab Admissions Coordinator to follow up.  Delice Lesch, MD, ABPMR Lavon Paganini Angiulli, PA-C 11/03/2017

## 2017-11-03 NOTE — Social Work (Addendum)
CSW acknowledging that PT has recommended CIR consult. CSW continuing to seek SNF options for pt and pt husband as back up.   11:37am- CSW also followed up with MD to update medical care team with current state of SNF search.   1:30pm- CSW left packet with pt SNF options at bedside with pt and pt states understanding of the packet and will share with husband when he comes to visit. CSW will also follow up with pt husband via telephone.   4:30pm- CSW spoke with pt husband, he understands that SNF may need to be chosen as a back up plan. He is aware of packet of SNF options left with pt and pt husband will discuss with her later.    CSW continuing to follow.   [place], [place] Work [phone]

## 2017-11-03 NOTE — Evaluation (Signed)
Occupational Therapy Evaluation Patient Details Name: Laura [name] MRN: [id number] DOB: Apr 22, 1961 Today's Date: 11/03/2017    History of Present Illness Laura [name] a 57 year old female with history of insulin-dependentT2DM, ?HFrEF (EF 40%), cognitive delay, systolic heart failure,  acute renal failure, and severe deconditioning. Admitted from [facility] where patient had limited functional mobility on 10/30/17 due to hypogylcemia and hypothermia.    Clinical Impression   Pt presented to OT session with generalized weakness and decreased activity tolerance for ADL. PTA, she was at [facility] receiving rehabilitation but has not been able to ambulate. Pt currently requires total assist for toileting transfers and toileting hygiene, total assist for LB ADL, and mod assist for UB ADL. She would benefit from continued OT services while admitted to improve independence and safety with ADL and functional mobility. Feel that pt would benefit from CIR level therapies post-acute D/C to maximize functional participation in ADL. However, do note CIR declined and as such she will need SNF level rehabilitation.     Follow Up Recommendations  Supervision/Assistance - 24 hour;CIR    Equipment Recommendations  Other (comment)(defer to next venue of care)    Recommendations for Other Services       Precautions / Restrictions Precautions Precautions: Fall Restrictions Weight Bearing Restrictions: No      Mobility Bed Mobility Overal bed mobility: Needs Assistance Bed Mobility: Supine to Sit     Supine to sit: Mod assist     General bed mobility comments: Assist at trunk to achieve upright seated position and to scoot hips to EOB.   Transfers Overall transfer level: Needs assistance Equipment used: None Transfers: Sit to/from [facility] Sit to Stand: Total assist         General transfer comment: Face to face assist and pt able to tolerate weight bearing but unable  to assume fully upright standing position.     Balance Overall balance assessment: Needs assistance Sitting-balance support: Bilateral upper extremity supported;Feet supported Sitting balance-Leahy Scale: Poor Sitting balance - Comments: Relies on BUE support for stability   Standing balance support: During functional activity Standing balance-Leahy Scale: Poor Standing balance comment: reliant on support from therapist                            ADL either performed or assessed with clinical judgement   ADL Overall ADL's : Needs assistance/impaired Eating/Feeding: Set up;Sitting   Grooming: Minimal assistance;Sitting   Upper Body Bathing: Moderate assistance;Sitting   Lower Body Bathing: Total assistance;Sit to/from stand   Upper Body Dressing : Moderate assistance;Sitting   Lower Body Dressing: Total assistance;Sit to/from stand   Toilet Transfer: Total assistance;Stand-pivot   Toileting- Clothing Manipulation and Hygiene: Total assistance;Sit to/from stand       Functional mobility during ADLs: Total assistance General ADL Comments: Pt able to take slight amount of weight through B LE to complete stand-pivot simulated toilet transfer but continues to require total assistance. Requires support for balance at EOB     Vision   Additional Comments: Need to continue to assess. Able to scan and make appropriate eye-contact throughout.      Perception     Praxis      Pertinent Vitals/Pain Pain Assessment: No/denies pain     Hand Dominance     Extremity/Trunk Assessment Upper Extremity Assessment Upper Extremity Assessment: Generalized weakness   Lower Extremity Assessment Lower Extremity Assessment: Generalized weakness       Communication Communication Communication:  No difficulties   Cognition Arousal/Alertness: Awake/alert Behavior During Therapy: WFL for tasks assessed/performed Overall Cognitive Status: Impaired/Different from  baseline Area of Impairment: Following commands;Safety/judgement;Awareness;Problem solving                       Following Commands: Follows multi-step commands inconsistently Safety/Judgement: Decreased awareness of safety Awareness: Emergent Problem Solving: Requires verbal cues;Requires tactile cues;Difficulty sequencing General Comments: Pt requiring verbal and tactile cues to follow multiple step commands. Pt demonstrating decreased awareness throughout session.    General Comments       Exercises     Shoulder Instructions      Home Living Family/patient expects to be discharged to:: Skilled nursing facility                                        Prior Functioning/Environment Level of Independence: Needs assistance  Gait / Transfers Assistance Needed: per chart review patient with limited ambulatory status at SNF ADL's / Homemaking Assistance Needed: pt reports she waws able to assist with tasks but requiring assistance for all ADL.             OT Problem List: Decreased strength;Decreased range of motion;Decreased activity tolerance;Impaired balance (sitting and/or standing);Decreased safety awareness;Decreased knowledge of use of DME or AE;Decreased knowledge of precautions;Decreased cognition      OT Treatment/Interventions: Self-care/ADL training;Therapeutic exercise;Energy conservation;DME and/or AE instruction;Therapeutic activities;Patient/family education;Balance training;Cognitive remediation/compensation    OT Goals(Current goals can be found in the care plan section) Acute Rehab OT Goals Patient Stated Goal: to go to CIR  OT Goal Formulation: With patient Time For Goal Achievement: 11/17/17 Potential to Achieve Goals: Good ADL Goals Pt Will Perform Grooming: with set-up;sitting Pt Will Perform Upper Body Dressing: with supervision;sitting Pt Will Transfer to Toilet: with min assist;squat pivot transfer;bedside commode Pt Will  Perform Toileting - Clothing Manipulation and hygiene: with min assist;sitting/lateral leans Pt/caregiver will Perform Home Exercise Program: Increased strength;Both right and left upper extremity;With written HEP provided  OT Frequency: Min 2X/week   Barriers to D/C:            Co-evaluation              AM-PAC PT "6 Clicks" Daily Activity     Outcome Measure Help from another person eating meals?: A Little Help from another person taking care of personal grooming?: A Little Help from another person toileting, which includes using toliet, bedpan, or urinal?: Total Help from another person bathing (including washing, rinsing, drying)?: A Lot Help from another person to put on and taking off regular upper body clothing?: A Lot Help from another person to put on and taking off regular lower body clothing?: Total 6 Click Score: 12   End of Session Equipment Utilized During Treatment: Gait belt Nurse Communication: Mobility status  Activity Tolerance: Patient tolerated treatment well Patient left: in chair;with call bell/phone within reach;with chair alarm set  OT Visit Diagnosis: Other abnormalities of gait and mobility (R26.89);Muscle weakness (generalized) (M62.81)                Time: 2951-8841 OT Time Calculation (min): 24 min Charges:  OT General Charges $OT Visit: 1 Visit OT Evaluation $OT Eval Moderate Complexity: 1 Mod OT Treatments $Self Care/Home Management : 8-22 mins G-Codes:     Norman Herrlich, MS OTR/L  Pager: [place] Jolic[place] Delira 11/03/2017, 12:32  PM

## 2017-11-04 ENCOUNTER — Other Ambulatory Visit: Payer: Self-pay

## 2017-11-04 ENCOUNTER — Encounter (HOSPITAL_COMMUNITY): Payer: Self-pay | Admitting: General Practice

## 2017-11-04 DIAGNOSIS — R64 Cachexia: Secondary | ICD-10-CM

## 2017-11-04 LAB — CBC
HCT: 25.9 % — ABNORMAL LOW (ref 36.0–46.0)
Hemoglobin: 8.3 g/dL — ABNORMAL LOW (ref 12.0–15.0)
MCH: 27.1 pg (ref 26.0–34.0)
MCHC: 32 g/dL (ref 30.0–36.0)
MCV: 84.6 fL (ref 78.0–100.0)
Platelets: 550 10*3/uL — ABNORMAL HIGH (ref 150–400)
RBC: 3.06 MIL/uL — ABNORMAL LOW (ref 3.87–5.11)
RDW: 16.7 % — ABNORMAL HIGH (ref 11.5–15.5)
WBC: 14.5 10*3/uL — ABNORMAL HIGH (ref 4.0–10.5)

## 2017-11-04 LAB — CULTURE, BLOOD (ROUTINE X 2)
CULTURE: NO GROWTH
Culture: NO GROWTH
SPECIAL REQUESTS: ADEQUATE
Special Requests: ADEQUATE

## 2017-11-04 LAB — BASIC METABOLIC PANEL
Anion gap: 9 (ref 5–15)
BUN: 51 mg/dL — ABNORMAL HIGH (ref 6–20)
CO2: 20 mmol/L — ABNORMAL LOW (ref 22–32)
Calcium: 8.1 mg/dL — ABNORMAL LOW (ref 8.9–10.3)
Chloride: 112 mmol/L — ABNORMAL HIGH (ref 101–111)
Creatinine, Ser: 2.18 mg/dL — ABNORMAL HIGH (ref 0.44–1.00)
GFR calc Af Amer: 28 mL/min — ABNORMAL LOW (ref >60)
GFR calc non Af Amer: 24 mL/min — ABNORMAL LOW (ref >60)
Glucose, Bld: 137 mg/dL — ABNORMAL HIGH (ref 65–99)
Potassium: 4.9 mmol/L (ref 3.5–5.1)
Sodium: 141 mmol/L (ref 135–145)

## 2017-11-04 LAB — GLUCOSE, CAPILLARY
GLUCOSE-CAPILLARY: 73 mg/dL (ref 65–99)
Glucose-Capillary: 138 mg/dL — ABNORMAL HIGH (ref 65–99)
Glucose-Capillary: 190 mg/dL — ABNORMAL HIGH (ref 65–99)
Glucose-Capillary: 158 mg/dL — ABNORMAL HIGH (ref 65–99)

## 2017-11-04 NOTE — Progress Notes (Addendum)
Physical Therapy Treatment Patient Details Name: Laura [name] MRN: [id number] DOB: [birth year] Today's Date: 11/04/2017    History of Present Illness Laura [name] a 57 year old female with history of insulin-dependentT2DM, ?HFrEF (EF 40%), cognitive delay, systolic heart failure,  acute renal failure, and severe deconditioning. Admitted from [facility] where patient had limited functional mobility on 10/30/17 due to hypogylcemia and hypothermia.     PT Comments    Pt  Is progressing slowly; she is motivated and cooperative with PT, agreeable to exercises  and OOB this pm; pt will benefit from post acute rehab to maximize independence, CIR pending per most recent note; pt states she does not want to return to [facility]; will continue to follow imn acute setting  Follow Up Recommendations  SNF;Supervision/Assistance - 24 hour(CIR declined)     Equipment Recommendations  Other (comment)(defer to next venue)    Recommendations for Other Services       Precautions / Restrictions Precautions Precautions: Fall Restrictions Weight Bearing Restrictions: No    Mobility  Bed Mobility Overal bed mobility: Needs Assistance Bed Mobility: Supine to Sit     Supine to sit: Min assist     General bed mobility comments: assist to elevate trunk to upright, light assist/facilitation to scoot to EOB in sitting  Transfers Overall transfer level: Needs assistance Equipment used: None Transfers: Squat Pivot Transfers     Squat pivot transfers: Max assist;Total assist     General transfer comment: partial stand/squat pivot bed<>chair with PT blocking knees as pt is too weak to WB through LEs  and  has poor trunk control   Ambulation/Gait             General Gait Details: unable    Stairs            Wheelchair Mobility    Modified Rankin (Stroke Patients Only)       Balance     Sitting balance-Leahy Scale: Fair Sitting balance - Comments: pt is able to  maintain static sit without UE support and close supervision ~ 18mins, requires UE support for dynamic balance     Standing balance-Leahy Scale: Zero Standing balance comment: pt is unable to stand and WB today without maximal support from therapist                            Cognition Arousal/Alertness: Awake/alert Behavior During Therapy: [facility] for tasks assessed/performed Overall Cognitive Status: No family/caregiver present to determine baseline cognitive functioning                         Following Commands: Follows multi-step commands inconsistently     Problem Solving: Difficulty sequencing;Requires verbal cues        Exercises General Exercises - Lower Extremity Ankle Circles/Pumps: AROM;AAROM;Both;10 reps Long Arc Quad: AROM;Strengthening;Both;10 reps;Seated;Limitations Long [employer] Limitations: limited ROm d/t weakness Hip Flexion/Marching: Strengthening;AROM;AAROM;Both;10 reps;Seated;Limitations Hip Flexion/Marching Limitations: requires facilitation to initiate movement on L Toe Raises: AROM;Both;AAROM;10 reps;Seated;Limitations Toe Raises Limitations: difficulty with df on R (strength grossly 1/5) Heel Raises: AROM;Both;10 reps;Seated    General Comments        Pertinent Vitals/Pain Pain Assessment: No/denies pain    Home Living                      Prior Function            PT Goals (current goals can now be found  in the care plan section) Acute Rehab PT Goals Patient Stated Goal: to go to rehab, get better PT Goal Formulation: With patient Time For Goal Achievement: 11/16/17 Potential to Achieve Goals: Fair Progress towards PT goals: Progressing toward goals    Frequency    Min 3X/week      PT Plan Discharge plan needs to be updated    Co-evaluation              AM-PAC PT "6 Clicks" Daily Activity  Outcome Measure  Difficulty turning over in bed (including adjusting bedclothes, sheets and blankets)?:  Unable Difficulty moving from lying on back to sitting on the side of the bed? : Unable Difficulty sitting down on and standing up from a chair with arms (e.g., wheelchair, bedside commode, etc,.)?: Unable Help needed moving to and from a bed to chair (including a wheelchair)?: Total Help needed walking in hospital room?: Total Help needed climbing 3-5 steps with a railing? : Total 6 Click Score: 6    End of Session Equipment Utilized During Treatment: Gait belt Activity Tolerance: Patient tolerated treatment well Patient left: in chair;with chair alarm set;with call bell/phone within reach   PT Visit Diagnosis: Muscle weakness (generalized) (M62.81);Other abnormalities of gait and mobility (R26.89)     Time: 6283-6629 PT Time Calculation (min) (ACUTE ONLY): 23 min  Charges:  $Gait Training: 8-22 mins $Therapeutic Activity: 8-22 mins                    G CodesKenyon Ana, PT Pager: [phone] 11/04/2017    [facility] 11/04/2017, 3:41 PM

## 2017-11-04 NOTE — Progress Notes (Signed)
Rehab admissions - I met with patient and her husband at the bedside.  Husband works 2nd shift.  He has 2 children with patient and then patient has another daughter who is expecting, going to nurses school and lives close by.  I have discussed with patient the need for 24/7 assistance.  He will talk to the family and see about a caregiver plan for after inpatient rehab.  I have opened the case with [facility] and have submitted clinicals for their review.  I will update all once I hear back from insurance case manager.  Call me for questions.  #[phone]

## 2017-11-04 NOTE — Discharge Summary (Signed)
Name: Laura [name] MRN: [id number] DOB: 1961-04-07 57 y.o. PCP: System, Pcp Not In  Date of Admission: 10/30/2017  8:17 AM Date of Discharge: 11/05/2017 Attending Physician: Dr. Murriel Hopper  Discharge Diagnosis: 1. Severe C. Diff infection 2. Hypoglycemia  3. Bacteruria  4. AKI on CKD 5. HFrEF 6. HTN 7. Sacral ulcer  8. Deconditioning   Discharge Medications: Allergies as of 11/05/2017      Reactions   Aspirin Hives   Statins Other (See Comments)   Joint pain      Medication List    STOP taking these medications   bisacodyl 5 MG EC tablet Commonly known as:  DULCOLAX   furosemide 20 MG tablet Commonly known as:  LASIX   modafinil 200 MG tablet Commonly known as:  PROVIGIL   PRESCRIPTION MEDICATION   simethicone 80 MG chewable tablet Commonly known as:  MYLICON     TAKE these medications   acetaminophen 325 MG tablet Commonly known as:  TYLENOL Take 650 mg by mouth every 4 (four) hours as needed for mild pain.   aluminum-magnesium hydroxide-simethicone 710-626-94 MG/5ML Susp Commonly known as:  MAALOX Take 60 mLs by mouth 4 (four) times daily -  before meals and at bedtime.   clopidogrel 75 MG tablet Commonly known as:  PLAVIX Take 75 mg by mouth daily.   ezetimibe 10 MG tablet Commonly known as:  ZETIA Take 10 mg by mouth daily.   feeding supplement (GLUCERNA SHAKE) Liqd Take 237 mLs by mouth 2 (two) times daily between meals.   feeding supplement (PRO-STAT SUGAR FREE 64) Liqd Take 30 mLs by mouth 2 (two) times daily.   Gerhardt's butt cream Crea Apply 1 application topically 4 (four) times daily.   insulin aspart 100 UNIT/ML injection Commonly known as:  novoLOG Inject 0-10 Units into the skin See admin instructions. If blood sugar is <70 call MD, 71-250=0, 251-300=2u, 301-350=4,351-400=6u, 401-500=8u, >500=10u and call MD   insulin glargine 100 UNIT/ML injection Commonly known as:  LANTUS Inject 5 Units into the skin at bedtime.     isosorbide mononitrate 60 MG 24 hr tablet Commonly known as:  IMDUR Take 60 mg by mouth daily.   metoprolol tartrate 50 MG tablet Commonly known as:  LOPRESSOR Take 50 mg by mouth 2 (two) times daily.   mirtazapine 15 MG tablet Commonly known as:  REMERON Take 15 mg by mouth at bedtime.   NIFEdipine 30 MG 24 hr tablet Commonly known as:  PROCARDIA-XL/ADALAT-CC/NIFEDICAL-XL Take 30 mg by mouth 2 (two) times daily.   pantoprazole 40 MG tablet Commonly known as:  PROTONIX Take 40 mg by mouth daily.   vancomycin 50 mg/mL oral solution Commonly known as:  VANCOCIN Take 2.5 mLs (125 mg total) by mouth every 6 (six) hours.   venlafaxine XR 150 MG 24 hr capsule Commonly known as:  EFFEXOR-XR Take 150 mg by mouth daily with breakfast. What changed:  Another medication with the same name was removed. Continue taking this medication, and follow the directions you see here.      Disposition and follow-up:   Laura [name] was discharged from [facility] in Stable condition.  At the hospital follow up visit please address:  1.  Please assess for ongoing GI symptoms (abdominal pain, N/V, diarrhea). Please assess for completion of vancomycin therapy. Please consider referral to cardiology and nephrology given new diagnosis of HF and CKD stage V. Please assess sacral ulcer and ensure it has continued to heal. Please ensure  patient is working with PT and OT as she is severely deconditioned. Please ensure patient has established with a PCP as she did not have one at the time of admission.  2.  Please check blood glucose as patient might need further adjustments in insulin regimen. Patient may not require insulin injections and potentially may be able to achieve glycemic control with oral medications.  3.  Hgb dropped from 10.1 on admission to 7.9 on discharge. May be secondary to CKD. No s/s of active bleeding. Please assess need for iron studies. Patient may need iron  supplementation.  4.  Labs / imaging needed at time of follow-up: CBC to check Hgb, BMP to check Cr, and CBG   5.  Pending labs/ test needing follow-up: None   Follow-up Appointments:   Hospital Course by problem list: Principal Problem:   Enteritis due to Clostridium difficile Active Problems:   Urinary tract infection   Pressure injury of skin   Diabetes mellitus type 2 in nonobese (HCC)   Hyperlipidemia   Cognitive impairment   Benign essential HTN   Chronic diastolic congestive heart failure (HCC)   Chronic kidney disease (CKD), stage IV (severe) (HCC)   Acute blood loss anemia   Anemia of chronic disease   Leukocytosis   1. Severe C. Diff infection: Patient presented with several days of abdominal pain, diarrhea, and hypoglycemia (see below). She was found to be hypothermic with T 95.4 and had a leukocytosis of 18.2. C diff testing positive for active infection and PO vancomycin started for severe CDI. Her abdominal pain and diarrhea quickly improved after starting antibiotic therapy. Her PO intake markedly improved as well. She will need to complete a course of PO vancomycin 125 mg q6h x 10 total days (1/27-2/5).  2. Hypoglycemia: Patient was brought to the ED due to 1-day history of persistent hypoglycemia. This was thought to be secondary to continuous insulin use at SNF (Lantus and SSI) despite poor PO intake due to C. diff colitis. She responded appropriately to several doses of D50. Her PO intake significantly improved after starting treatment for CDI and  CBGs 110s-170s. She was started on home Lantus and SSI. She did not experience further hypoglycemic episodes during admission.   3. Bacteriuria: Patient presented with hypothermia and leukocytosis of 18.2.UA on admission with nitrites, large leukocytes, and many bacteria and she was started on Rocephin.Patient has had chronic indwelling foley catheter for the past ~ 2 months. Unclear how often this is exchanged orthe last  timeitwas exchangedprior to arrival to the ED. Foley catheter exchanged 1/26 in the ED. UCx with multiple species and Rocephin discontinued as the etiology of sepsis is thought to be secondary to severe CDI.  4. AKI on CKD: Per patient's husband, she did not have a history of kidney disease prior to recent admission 2 months ago (Novemeber 2018) at [facility]. Husband states she presented in acute renal failure and required several sessions of intermittent hemodialysis. Since then she has required a chronic indwelling catheter to void. She has failed multiple TOV in the past (not attempted during this admission). Serum creatinine 2.4 on admission with mild improvement to 2.05 with gentle IVF hydration. Suspect her baseline is ~ 2.  5. Anemia: Patient admitted with Hb of 10.1, on discharge was noted to be 7.9. May be secondary to CKD, however we do not have records of her previous baseline. No s/s of active bleeding. Please consider ordering iron studies and need for iron supplementation pending  these results and Hb trend.  6. HFrEF: Per patient's husband, she was diagnosed with HFrEF EF 40% 2 months ago at [facility]. Unclear if she had an ischemic evaluation as we were unable to obtain records despite multiple attempts. She was initially hypovolemic in the setting of active diarrhea and received gentle diuresis. She remained euvolemic after IVF hydration and was continued on home metoprolol. Home furosemide 20mg  daily was held on discharge. She will likely need an outpatient referral to cardiology for further evaluation and management.  7. HTN: Patient was on Imdur, metoprolol, and Procardia at home. During her hospitalization, she was persistently hypertensive while on her home regimen. She was discharged on home Imdur 60 daily, metoprolol 50 BID, and Procardia 30 mg BID.  8. Sacral ulcer: Patient was found to have a stage 2 sacral ulcer that extended to labia on admission. This was  thought to be secondary to diarrhea. Wound care was consulted and patient was appropriately treated. She was discharged to CIR on 2/1.  9. Deconditioning: Patient has severely deconditioned over the past 2 months after a prolonged admission at [facility]. Patient has cognitive delay but, per husband, used to be independent prior to admission. Now she requires maximum assistance to perform ADLs and is unable to ambulate due to bilateral lower extremity weakness. She was evaluated by PT who recommended CIR vs SNF. She was discharged to CIR on 2/1.  Discharge Vitals:   BP 139/86 (BP Location: Right Arm)   Pulse 93   Temp 97.7 F (36.5 C) (Oral)   Resp 16   Ht 4\' 11"  (1.499 m)   Wt 121 lb (54.9 kg)   SpO2 100%   BMI 24.44 kg/m   Pertinent Labs, Studies, and Procedures:   CBC Latest Ref Rng & Units 11/05/2017 11/04/2017 11/03/2017  WBC 4.0 - 10.5 K/uL 12.9(H) 14.5(H) 14.3(H)  Hemoglobin 12.0 - 15.0 g/dL 7.9(L) 8.3(L) 8.3(L)  Hematocrit 36.0 - 46.0 % 24.4(L) 25.9(L) 25.9(L)  Platelets 150 - 400 K/uL 527(H) 550(H) 526(H)   BMP Latest Ref Rng & Units 11/05/2017 11/04/2017 11/03/2017  Glucose 65 - 99 mg/dL 81 137(H) 134(H)  BUN 6 - 20 mg/dL 52(H) 51(H) 50(H)  Creatinine 0.44 - 1.00 mg/dL 2.05(H) 2.18(H) 2.50(H)  Sodium 135 - 145 mmol/L 142 141 141  Potassium 3.5 - 5.1 mmol/L 4.8 4.9 4.7  Chloride 101 - 111 mmol/L 113(H) 112(H) 112(H)  CO2 22 - 32 mmol/L 21(L) 20(L) 21(L)  Calcium 8.9 - 10.3 mg/dL 8.3(L) 8.1(L) 8.0(L)   Blood culture negative x2   CXR 1/26: FINDINGS: Cardiomegaly and/or pericardial fluid. Bilateral pleural effusions layering dependently with dependent atelectasis. Mild interstitial edema pattern diffusely. No significant bone finding.  Discharge Instructions: Discharge Instructions    Call MD for:  difficulty breathing, headache or visual disturbances   Complete by:  As directed    Call MD for:  extreme fatigue   Complete by:  As directed    Call MD for:   persistant dizziness or light-headedness   Complete by:  As directed    Call MD for:  persistant nausea and vomiting   Complete by:  As directed    Call MD for:  redness, tenderness, or signs of infection (pain, swelling, redness, odor or green/yellow discharge around incision site)   Complete by:  As directed    Call MD for:  severe uncontrolled pain   Complete by:  As directed    Call MD for:  temperature >100.4  Complete by:  As directed    Diet - low sodium heart healthy   Complete by:  As directed    Increase activity slowly   Complete by:  As directed      Signed: Colbert Ewing, MD 11/05/2017, 2:43 PM   Pager: [phone]

## 2017-11-04 NOTE — Progress Notes (Signed)
Subjective:  No acute events overnight.  Continues to feel well.  She denies abdominal pain and further episodes of diarrhea.  She did have one bowel movement yesterday, but no diarrhea.  Discussed with patient will continue to treat C. difficile infection and awaiting for SNF placement.  Patient verbalized understanding and is in agreement with plan.  All questions answered.  Objective:  Vital signs in last 24 hours: Vitals:   11/03/17 1410 11/03/17 1954 11/03/17 2347 11/04/17 0455  BP: (!) 173/99 (!) 172/95 (!) 152/87 (!) 166/92  Pulse: 95 100  95  Resp: 18 17  15   Temp: 97.9 F (36.6 C) 98.2 F (36.8 C)  98.6 F (37 C)  TempSrc: Oral Oral  Oral  SpO2: 99% 98%  98%  Weight:      Height:       Physical Exam  Constitutional: She is oriented to person, place, and time. She appears well-developed. She appears cachectic. No distress.  Cardiovascular: Normal rate and normal heart sounds. Exam reveals no gallop and no friction rub.  No murmur heard. Pulmonary/Chest: Effort normal and breath sounds normal. No respiratory distress. She has no wheezes. She has no rales.  Abdominal: Soft. Bowel sounds are normal. She exhibits no distension. There is no tenderness.  Musculoskeletal: She exhibits no edema.  Neurological: She is alert and oriented to person, place, and time.  Skin: Skin is warm.    Assessment/Plan:  Active Problems:   Urinary tract infection   Pressure injury of skin   Diabetes mellitus type 2 in nonobese (HCC)   Hyperlipidemia   Cognitive impairment   Benign essential HTN   Chronic diastolic congestive heart failure (HCC)   Chronic kidney disease (CKD), stage IV (severe) (HCC)   Acute blood loss anemia   Anemia of chronic disease   Leukocytosis   Enteritis due to Clostridium difficile  #Severe EXB:MWUXLKG presented septic with hypothermia, leukocytosis, and persistent hypoglycemia. C. Diff test positive and started on PO vancomycin. She has remained  afebrileand hemodynamically stable. Her abdominal pain is nowresolved and her appetite continues to improve. No episodes of diarrhea yesterday or today. Bcx NGTD.We will continue to monitor.  -PO vancomycin 125 x 10 days (1/27-)--> 500 x 9 days (1-28)--> 125 q6h 1/29x 8 days. Will continue treatment for 6 more days.  - Follow-up SW consult. Husband reviewing SNFoptions  # Hypoglycemia:  # Insulin-dependent T2DM Patient was diagnosed with type 2 diabetes 20 years ago. Her hyperglycemia has been difficult to control due to medication noncompliance.She follows up with endocrinology who were planning to start patient on Afrezza.Her insulin regimen is unclear. Per SNF documentation, she is receiving Lantus 5 at bedtime and SSI.CBGs 115-147 after starting long-acting insulin yesterday. Will continue current regimen.  - Start Lantus 5 units QHS -SSI-S +CBG monitoring   # Deconditioning # Sacral ulcer Secondary to recent prolonged hospitalization. Per husband, patient able to ambulate and perform ADLs independently prior to hospitalization. Now unable to ambulate or move lower extremities.She has alsoa pressure injury in her sacrum that extends to her labia. - PT - Continue Gerhardt's butt cream per [place] recs   # ?MWN:[id number] creatinine unknown.Cr 2.4on admission. Per husband, patientdid not have a history of kidney disease prior to [place]. K 4.7and SCr 2.1today after gentle IV hydration.  - Strict I/Os -Holding home Lasix - Avoid nephrotoxic medications -Holding homemodafinil (unclear indication)  # ?HFrEF:EF 40%.Attemptingto obtain records from OSH.Appearseuvolemic on exam. Will continue to monitor volume status. - Strict I/Os +  daily weights  - Continue home metoprolol100 mg BID - Continue home Imdur 60 mg QD  - Continue home Procardia 30 mg BID  #Bacteruria:Patient presented with hypothermia and leukocytosis of 18.2.UA on  admission with nitrites, large leukocytes, and many bacteria.Patient has had chronic indwelling foley catheter for the past ~ 2 months. Unclear how often this is exchanged orthe last timeitwas exchangedprior to arrival to the ED.Per husband, patient lost her ability to void spontaneously 2 months ago during hospitalization at [facility]Foley catheter exchanged 1/26 in the ED.Rocephin discontinued as the etiology of sepsis is though to be severe CDI and not UTI. Ucx with multiple species.  # HLD:  - Continue home Zetia 10 mg QD   Dispo: Anticipated discharge in approximately 2-3 day(s) pending SNF placement.   Welford Roche, MD 11/04/2017, 6:39 AM Pager: [phone]

## 2017-11-04 NOTE — Progress Notes (Signed)
Nutrition Follow-up  DOCUMENTATION CODES:   Not applicable  INTERVENTION:   -Continue Ensure Enlive po BID, each supplement provides 350 kcal and 20 grams of protein -Continue 30 ml Prostat BID, each supplement provides 100 kcals and 15 grams protein -Continue feeding assistance with meals  NUTRITION DIAGNOSIS:   Increased nutrient needs related to wound healing as evidenced by estimated needs.  Ongoing  GOAL:   Patient will meet greater than or equal to 90% of their needs  Progressing  MONITOR:   PO intake, Supplement acceptance, Labs, Skin, Weight trends, I & O's  REASON FOR ASSESSMENT:   Malnutrition Screening Tool    ASSESSMENT:   57 yo female with PMH T2DM, CHF, hyperlipidemia, hypercholesterolemia, and AKI admitted from SNF on 1/26 with hypoglycemia & hypothermia secondary to C diff sepsis infection  Case discussed with RN, who reports pt's appetite has improved; noted meal completion 25-100%. RN confirms that pt is being assisted with meals and taking supplements well.   Per chart review, plan is for CIR vs SNF at discharge.   Labs reviewed: CBGS: 122-167 (inpatient orders for glycemic control are 0-9 units insulin aspart TID with meals and 5 units insulin glargine daily).   Diet Order:  Diet Carb Modified Fluid consistency: Thin; Room service appropriate? Yes  EDUCATION NEEDS:   No education needs have been identified at this time  Skin:  Skin Assessment: Skin Integrity Issues: Skin Integrity Issues:: Unstageable Stage II: NA Unstageable: Unstageable pressure injury to coccyx  Last BM:  11/04/17  Height:   Ht Readings from Last 1 Encounters:  10/30/17 4\' 11"  (1.499 m)    Weight:   Wt Readings from Last 1 Encounters:  11/03/17 171 lb (77.6 kg)    Ideal Body Weight:  44.6 kg  BMI:  Body mass index is 34.54 kg/m.  Estimated Nutritional Needs:   Kcal:  1,400-1,600 kcal  Protein:  55-65 grams  Fluid:  1.4-1.6 L    Jacque Byron A.  Jimmye Norman, RD, LDN, CDE Pager: [phone] After hours Pager: [phone]

## 2017-11-05 ENCOUNTER — Encounter (HOSPITAL_COMMUNITY): Payer: Self-pay | Admitting: Physical Medicine and Rehabilitation

## 2017-11-05 ENCOUNTER — Inpatient Hospital Stay (HOSPITAL_COMMUNITY)
Admission: RE | Admit: 2017-11-05 | Discharge: 2017-12-02 | DRG: 947 | Disposition: A | Discharge status: Skilled Nursing Facility | Payer: No Typology Code available for payment source | Source: Intra-hospital | Attending: Physical Medicine & Rehabilitation | Admitting: Physical Medicine & Rehabilitation

## 2017-11-05 DIAGNOSIS — N183 Chronic kidney disease, stage 3 (moderate): Secondary | ICD-10-CM | POA: Diagnosis not present

## 2017-11-05 DIAGNOSIS — I130 Hypertensive heart and chronic kidney disease with heart failure and stage 1 through stage 4 chronic kidney disease, or unspecified chronic kidney disease: Secondary | ICD-10-CM | POA: Diagnosis present

## 2017-11-05 DIAGNOSIS — D649 Anemia, unspecified: Secondary | ICD-10-CM | POA: Diagnosis not present

## 2017-11-05 DIAGNOSIS — I10 Essential (primary) hypertension: Secondary | ICD-10-CM | POA: Diagnosis not present

## 2017-11-05 DIAGNOSIS — R4189 Other symptoms and signs involving cognitive functions and awareness: Secondary | ICD-10-CM | POA: Diagnosis present

## 2017-11-05 DIAGNOSIS — Z86718 Personal history of other venous thrombosis and embolism: Secondary | ICD-10-CM | POA: Diagnosis not present

## 2017-11-05 DIAGNOSIS — N319 Neuromuscular dysfunction of bladder, unspecified: Secondary | ICD-10-CM

## 2017-11-05 DIAGNOSIS — IMO0002 Reserved for concepts with insufficient information to code with codable children: Secondary | ICD-10-CM | POA: Diagnosis present

## 2017-11-05 DIAGNOSIS — J810 Acute pulmonary edema: Secondary | ICD-10-CM | POA: Diagnosis not present

## 2017-11-05 DIAGNOSIS — N179 Acute kidney failure, unspecified: Secondary | ICD-10-CM | POA: Diagnosis present

## 2017-11-05 DIAGNOSIS — I5020 Unspecified systolic (congestive) heart failure: Secondary | ICD-10-CM

## 2017-11-05 DIAGNOSIS — E1149 Type 2 diabetes mellitus with other diabetic neurological complication: Secondary | ICD-10-CM | POA: Diagnosis present

## 2017-11-05 DIAGNOSIS — D500 Iron deficiency anemia secondary to blood loss (chronic): Secondary | ICD-10-CM | POA: Diagnosis not present

## 2017-11-05 DIAGNOSIS — L89150 Pressure ulcer of sacral region, unstageable: Secondary | ICD-10-CM | POA: Diagnosis present

## 2017-11-05 DIAGNOSIS — Z7901 Long term (current) use of anticoagulants: Secondary | ICD-10-CM | POA: Diagnosis not present

## 2017-11-05 DIAGNOSIS — E1140 Type 2 diabetes mellitus with diabetic neuropathy, unspecified: Secondary | ICD-10-CM | POA: Diagnosis present

## 2017-11-05 DIAGNOSIS — R195 Other fecal abnormalities: Secondary | ICD-10-CM | POA: Diagnosis not present

## 2017-11-05 DIAGNOSIS — F419 Anxiety disorder, unspecified: Secondary | ICD-10-CM | POA: Diagnosis not present

## 2017-11-05 DIAGNOSIS — E1142 Type 2 diabetes mellitus with diabetic polyneuropathy: Secondary | ICD-10-CM

## 2017-11-05 DIAGNOSIS — E860 Dehydration: Secondary | ICD-10-CM | POA: Diagnosis present

## 2017-11-05 DIAGNOSIS — E875 Hyperkalemia: Secondary | ICD-10-CM | POA: Diagnosis present

## 2017-11-05 DIAGNOSIS — I5022 Chronic systolic (congestive) heart failure: Secondary | ICD-10-CM | POA: Diagnosis present

## 2017-11-05 DIAGNOSIS — K264 Chronic or unspecified duodenal ulcer with hemorrhage: Secondary | ICD-10-CM | POA: Diagnosis present

## 2017-11-05 DIAGNOSIS — R55 Syncope and collapse: Secondary | ICD-10-CM | POA: Diagnosis present

## 2017-11-05 DIAGNOSIS — N390 Urinary tract infection, site not specified: Secondary | ICD-10-CM | POA: Diagnosis present

## 2017-11-05 DIAGNOSIS — E46 Unspecified protein-calorie malnutrition: Secondary | ICD-10-CM | POA: Diagnosis present

## 2017-11-05 DIAGNOSIS — Z833 Family history of diabetes mellitus: Secondary | ICD-10-CM

## 2017-11-05 DIAGNOSIS — M21372 Foot drop, left foot: Secondary | ICD-10-CM | POA: Diagnosis present

## 2017-11-05 DIAGNOSIS — E872 Acidosis: Secondary | ICD-10-CM | POA: Diagnosis present

## 2017-11-05 DIAGNOSIS — R45851 Suicidal ideations: Secondary | ICD-10-CM | POA: Diagnosis present

## 2017-11-05 DIAGNOSIS — J42 Unspecified chronic bronchitis: Secondary | ICD-10-CM | POA: Diagnosis present

## 2017-11-05 DIAGNOSIS — Z888 Allergy status to other drugs, medicaments and biological substances status: Secondary | ICD-10-CM

## 2017-11-05 DIAGNOSIS — I82491 Acute embolism and thrombosis of other specified deep vein of right lower extremity: Secondary | ICD-10-CM | POA: Diagnosis present

## 2017-11-05 DIAGNOSIS — M7989 Other specified soft tissue disorders: Secondary | ICD-10-CM | POA: Diagnosis not present

## 2017-11-05 DIAGNOSIS — Z794 Long term (current) use of insulin: Secondary | ICD-10-CM

## 2017-11-05 DIAGNOSIS — E1121 Type 2 diabetes mellitus with diabetic nephropathy: Secondary | ICD-10-CM | POA: Diagnosis present

## 2017-11-05 DIAGNOSIS — N184 Chronic kidney disease, stage 4 (severe): Secondary | ICD-10-CM | POA: Diagnosis present

## 2017-11-05 DIAGNOSIS — R5381 Other malaise: Principal | ICD-10-CM | POA: Diagnosis present

## 2017-11-05 DIAGNOSIS — B965 Pseudomonas (aeruginosa) (mallei) (pseudomallei) as the cause of diseases classified elsewhere: Secondary | ICD-10-CM | POA: Diagnosis present

## 2017-11-05 DIAGNOSIS — Z7902 Long term (current) use of antithrombotics/antiplatelets: Secondary | ICD-10-CM

## 2017-11-05 DIAGNOSIS — R1030 Lower abdominal pain, unspecified: Secondary | ICD-10-CM | POA: Diagnosis not present

## 2017-11-05 DIAGNOSIS — R0602 Shortness of breath: Secondary | ICD-10-CM

## 2017-11-05 DIAGNOSIS — D631 Anemia in chronic kidney disease: Secondary | ICD-10-CM | POA: Diagnosis present

## 2017-11-05 DIAGNOSIS — D123 Benign neoplasm of transverse colon: Secondary | ICD-10-CM | POA: Diagnosis not present

## 2017-11-05 DIAGNOSIS — A0472 Enterocolitis due to Clostridium difficile, not specified as recurrent: Secondary | ICD-10-CM | POA: Diagnosis present

## 2017-11-05 DIAGNOSIS — R0603 Acute respiratory distress: Secondary | ICD-10-CM | POA: Diagnosis not present

## 2017-11-05 DIAGNOSIS — K648 Other hemorrhoids: Secondary | ICD-10-CM | POA: Diagnosis present

## 2017-11-05 DIAGNOSIS — D72829 Elevated white blood cell count, unspecified: Secondary | ICD-10-CM | POA: Diagnosis not present

## 2017-11-05 DIAGNOSIS — K269 Duodenal ulcer, unspecified as acute or chronic, without hemorrhage or perforation: Secondary | ICD-10-CM | POA: Diagnosis not present

## 2017-11-05 DIAGNOSIS — G0481 Other encephalitis and encephalomyelitis: Secondary | ICD-10-CM

## 2017-11-05 DIAGNOSIS — E1122 Type 2 diabetes mellitus with diabetic chronic kidney disease: Secondary | ICD-10-CM

## 2017-11-05 DIAGNOSIS — I2510 Atherosclerotic heart disease of native coronary artery without angina pectoris: Secondary | ICD-10-CM | POA: Diagnosis present

## 2017-11-05 DIAGNOSIS — I169 Hypertensive crisis, unspecified: Secondary | ICD-10-CM | POA: Diagnosis present

## 2017-11-05 DIAGNOSIS — N189 Chronic kidney disease, unspecified: Secondary | ICD-10-CM

## 2017-11-05 DIAGNOSIS — Z9119 Patient's noncompliance with other medical treatment and regimen: Secondary | ICD-10-CM

## 2017-11-05 DIAGNOSIS — R8290 Unspecified abnormal findings in urine: Secondary | ICD-10-CM | POA: Diagnosis not present

## 2017-11-05 DIAGNOSIS — M21371 Foot drop, right foot: Secondary | ICD-10-CM | POA: Diagnosis present

## 2017-11-05 DIAGNOSIS — Z886 Allergy status to analgesic agent status: Secondary | ICD-10-CM

## 2017-11-05 DIAGNOSIS — G7281 Critical illness myopathy: Secondary | ICD-10-CM | POA: Insufficient documentation

## 2017-11-05 DIAGNOSIS — E8809 Other disorders of plasma-protein metabolism, not elsewhere classified: Secondary | ICD-10-CM

## 2017-11-05 DIAGNOSIS — R9389 Abnormal findings on diagnostic imaging of other specified body structures: Secondary | ICD-10-CM | POA: Diagnosis not present

## 2017-11-05 DIAGNOSIS — G9340 Encephalopathy, unspecified: Secondary | ICD-10-CM | POA: Diagnosis present

## 2017-11-05 DIAGNOSIS — R338 Other retention of urine: Secondary | ICD-10-CM | POA: Diagnosis present

## 2017-11-05 DIAGNOSIS — Z6820 Body mass index (BMI) 20.0-20.9, adult: Secondary | ICD-10-CM

## 2017-11-05 DIAGNOSIS — F329 Major depressive disorder, single episode, unspecified: Secondary | ICD-10-CM | POA: Diagnosis present

## 2017-11-05 DIAGNOSIS — I4581 Long QT syndrome: Secondary | ICD-10-CM | POA: Diagnosis present

## 2017-11-05 DIAGNOSIS — E7800 Pure hypercholesterolemia, unspecified: Secondary | ICD-10-CM | POA: Diagnosis present

## 2017-11-05 DIAGNOSIS — D62 Acute posthemorrhagic anemia: Secondary | ICD-10-CM | POA: Diagnosis present

## 2017-11-05 DIAGNOSIS — E1165 Type 2 diabetes mellitus with hyperglycemia: Secondary | ICD-10-CM | POA: Diagnosis present

## 2017-11-05 DIAGNOSIS — E11319 Type 2 diabetes mellitus with unspecified diabetic retinopathy without macular edema: Secondary | ICD-10-CM | POA: Diagnosis present

## 2017-11-05 DIAGNOSIS — M549 Dorsalgia, unspecified: Secondary | ICD-10-CM | POA: Diagnosis present

## 2017-11-05 DIAGNOSIS — R339 Retention of urine, unspecified: Secondary | ICD-10-CM | POA: Diagnosis present

## 2017-11-05 HISTORY — DX: Nephrotic syndrome with unspecified morphologic changes: N04.9

## 2017-11-05 HISTORY — DX: Atherosclerotic heart disease of native coronary artery without angina pectoris: I25.10

## 2017-11-05 HISTORY — DX: Duodenal ulcer, unspecified as acute or chronic, without hemorrhage or perforation: K26.9

## 2017-11-05 HISTORY — DX: Type 2 diabetes mellitus with diabetic polyneuropathy: E11.42

## 2017-11-05 HISTORY — DX: Enterocolitis due to Clostridium difficile, not specified as recurrent: A04.72

## 2017-11-05 LAB — CBC
HCT: 24.4 % — ABNORMAL LOW (ref 36.0–46.0)
HEMOGLOBIN: 7.9 g/dL — AB (ref 12.0–15.0)
MCH: 27.5 pg (ref 26.0–34.0)
MCHC: 32.4 g/dL (ref 30.0–36.0)
MCV: 85 fL (ref 78.0–100.0)
PLATELETS: 527 10*3/uL — AB (ref 150–400)
RBC: 2.87 MIL/uL — AB (ref 3.87–5.11)
RDW: 17 % — ABNORMAL HIGH (ref 11.5–15.5)
WBC: 12.9 10*3/uL — AB (ref 4.0–10.5)

## 2017-11-05 LAB — BASIC METABOLIC PANEL
ANION GAP: 8 (ref 5–15)
BUN: 52 mg/dL — ABNORMAL HIGH (ref 6–20)
CALCIUM: 8.3 mg/dL — AB (ref 8.9–10.3)
CO2: 21 mmol/L — ABNORMAL LOW (ref 22–32)
Chloride: 113 mmol/L — ABNORMAL HIGH (ref 101–111)
Creatinine, Ser: 2.05 mg/dL — ABNORMAL HIGH (ref 0.44–1.00)
GFR, EST AFRICAN AMERICAN: 30 mL/min — AB (ref >60)
GFR, EST NON AFRICAN AMERICAN: 26 mL/min — AB (ref >60)
Glucose, Bld: 81 mg/dL (ref 65–99)
Potassium: 4.8 mmol/L (ref 3.5–5.1)
SODIUM: 142 mmol/L (ref 135–145)

## 2017-11-05 LAB — GLUCOSE, CAPILLARY
GLUCOSE-CAPILLARY: 119 mg/dL — AB (ref 65–99)
Glucose-Capillary: 87 mg/dL (ref 65–99)
Glucose-Capillary: 136 mg/dL — ABNORMAL HIGH (ref 65–99)
Glucose-Capillary: 111 mg/dL — ABNORMAL HIGH (ref 65–99)

## 2017-11-05 MED ORDER — GERHARDT'S BUTT CREAM
TOPICAL_CREAM | Freq: Four times a day (QID) | CUTANEOUS | Status: DC
  Administered 2017-11-05 – 2017-11-06 (×2): via TOPICAL
  Administered 2017-11-06: 1 via TOPICAL
  Administered 2017-11-06 – 2017-11-14 (×20): via TOPICAL
  Filled 2017-11-05 (×3): qty 1

## 2017-11-05 MED ORDER — GLUCERNA SHAKE PO LIQD: 237.0000 mL | Freq: Two times a day (BID) | ORAL | 1 refills | Status: DC

## 2017-11-05 MED ORDER — ACETAMINOPHEN 325 MG PO TABS
325.0000 mg | ORAL_TABLET | ORAL | Status: DC | PRN
  Administered 2017-11-09 – 2017-11-16 (×6): 650 mg via ORAL
  Filled 2017-11-05 (×7): qty 2

## 2017-11-05 MED ORDER — EZETIMIBE 10 MG PO TABS
10.0000 mg | ORAL_TABLET | Freq: Every day | ORAL | Status: DC
  Administered 2017-11-06 – 2017-12-02 (×27): 10 mg via ORAL
  Filled 2017-11-05 (×27): qty 1

## 2017-11-05 MED ORDER — CLOPIDOGREL BISULFATE 75 MG PO TABS
75.0000 mg | ORAL_TABLET | Freq: Every day | ORAL | Status: DC
  Administered 2017-11-06 – 2017-11-13 (×7): 75 mg via ORAL
  Filled 2017-11-05 (×8): qty 1

## 2017-11-05 MED ORDER — INSULIN ASPART 100 UNIT/ML ~~LOC~~ SOLN
0.0000 [IU] | Freq: Three times a day (TID) | SUBCUTANEOUS | Status: DC
  Administered 2017-11-07: 1 [IU] via SUBCUTANEOUS
  Administered 2017-11-08: 3 [IU] via SUBCUTANEOUS
  Administered 2017-11-08 – 2017-11-09 (×2): 1 [IU] via SUBCUTANEOUS
  Administered 2017-11-10: 2 [IU] via SUBCUTANEOUS
  Administered 2017-11-10 – 2017-11-16 (×6): 1 [IU] via SUBCUTANEOUS
  Administered 2017-11-16: 2 [IU] via SUBCUTANEOUS
  Administered 2017-11-17: 1 [IU] via SUBCUTANEOUS
  Administered 2017-11-17: 2 [IU] via SUBCUTANEOUS
  Administered 2017-11-18 – 2017-11-25 (×4): 1 [IU] via SUBCUTANEOUS
  Administered 2017-11-26 (×2): 2 [IU] via SUBCUTANEOUS
  Administered 2017-11-27: 1 [IU] via SUBCUTANEOUS
  Administered 2017-11-28: 2 [IU] via SUBCUTANEOUS
  Administered 2017-11-28 – 2017-11-29 (×2): 1 [IU] via SUBCUTANEOUS
  Administered 2017-11-30: 2 [IU] via SUBCUTANEOUS
  Administered 2017-11-30 – 2017-12-01 (×3): 1 [IU] via SUBCUTANEOUS
  Administered 2017-12-02: 2 [IU] via SUBCUTANEOUS

## 2017-11-05 MED ORDER — PROCHLORPERAZINE EDISYLATE 5 MG/ML IJ SOLN: 5.0000 mg | Freq: Four times a day (QID) | INTRAMUSCULAR | Status: DC | PRN

## 2017-11-05 MED ORDER — PRO-STAT SUGAR FREE PO LIQD: 30.0000 mL | Freq: Two times a day (BID) | ORAL | 4 refills | Status: AC

## 2017-11-05 MED ORDER — GLUCERNA SHAKE PO LIQD
237.0000 mL | Freq: Three times a day (TID) | ORAL | Status: DC
  Administered 2017-11-06 – 2017-11-09 (×8): 237 mL via ORAL

## 2017-11-05 MED ORDER — GERHARDT'S BUTT CREAM: 1.0000 "application " | TOPICAL_CREAM | Freq: Four times a day (QID) | CUTANEOUS | 6 refills | Status: AC

## 2017-11-05 MED ORDER — ALUM & MAG HYDROXIDE-SIMETH 200-200-20 MG/5ML PO SUSP: 30.0000 mL | ORAL | Status: DC | PRN

## 2017-11-05 MED ORDER — INSULIN GLARGINE 100 UNIT/ML ~~LOC~~ SOLN
5.0000 [IU] | Freq: Every day | SUBCUTANEOUS | Status: DC
  Administered 2017-11-06 – 2017-11-09 (×4): 5 [IU] via SUBCUTANEOUS
  Filled 2017-11-05 (×4): qty 0.05

## 2017-11-05 MED ORDER — ENOXAPARIN SODIUM 40 MG/0.4ML ~~LOC~~ SOLN
40.0000 mg | SUBCUTANEOUS | Status: DC
  Administered 2017-11-05 – 2017-11-07 (×3): 40 mg via SUBCUTANEOUS
  Filled 2017-11-05 (×4): qty 0.4

## 2017-11-05 MED ORDER — VENLAFAXINE HCL ER 75 MG PO CP24
150.0000 mg | ORAL_CAPSULE | Freq: Every day | ORAL | Status: DC
  Administered 2017-11-06 – 2017-12-02 (×27): 150 mg via ORAL
  Filled 2017-11-05 (×27): qty 2

## 2017-11-05 MED ORDER — GUAIFENESIN-DM 100-10 MG/5ML PO SYRP: 5.0000 mL | ORAL_SOLUTION | Freq: Four times a day (QID) | ORAL | Status: DC | PRN

## 2017-11-05 MED ORDER — PRO-STAT SUGAR FREE PO LIQD
30.0000 mL | Freq: Two times a day (BID) | ORAL | Status: DC
  Administered 2017-11-05 – 2017-12-02 (×52): 30 mL via ORAL
  Filled 2017-11-05 (×52): qty 30

## 2017-11-05 MED ORDER — NYSTATIN 100000 UNIT/GM EX CREA
TOPICAL_CREAM | Freq: Two times a day (BID) | CUTANEOUS | Status: DC
  Administered 2017-11-05: 21:00:00 via TOPICAL
  Administered 2017-11-06: 1 via TOPICAL
  Administered 2017-11-07: 11:00:00 via TOPICAL
  Administered 2017-11-07: 1 via TOPICAL
  Administered 2017-11-08 – 2017-11-15 (×12): via TOPICAL
  Filled 2017-11-05: qty 15

## 2017-11-05 MED ORDER — ISOSORBIDE MONONITRATE ER 30 MG PO TB24
60.0000 mg | ORAL_TABLET | Freq: Every day | ORAL | Status: DC
  Administered 2017-11-06 – 2017-11-08 (×3): 60 mg via ORAL
  Filled 2017-11-05 (×3): qty 2

## 2017-11-05 MED ORDER — MIRTAZAPINE 15 MG PO TABS
15.0000 mg | ORAL_TABLET | Freq: Every day | ORAL | Status: DC
  Administered 2017-11-05 – 2017-11-12 (×7): 15 mg via ORAL
  Filled 2017-11-05 (×7): qty 1

## 2017-11-05 MED ORDER — POLYETHYLENE GLYCOL 3350 17 G PO PACK: 17.0000 g | PACK | Freq: Every day | ORAL | Status: DC | PRN

## 2017-11-05 MED ORDER — VANCOMYCIN 50 MG/ML ORAL SOLUTION
125.0000 mg | Freq: Four times a day (QID) | ORAL | Status: AC
  Administered 2017-11-05 – 2017-11-10 (×19): 125 mg via ORAL
  Filled 2017-11-05 (×24): qty 2.5

## 2017-11-05 MED ORDER — PROCHLORPERAZINE 25 MG RE SUPP: 12.5000 mg | Freq: Four times a day (QID) | RECTAL | Status: DC | PRN

## 2017-11-05 MED ORDER — HYDROCERIN EX CREA
TOPICAL_CREAM | Freq: Two times a day (BID) | CUTANEOUS | Status: AC
  Administered 2017-11-05: 21:00:00 via TOPICAL
  Administered 2017-11-06: 1 via TOPICAL
  Administered 2017-11-07 – 2017-11-09 (×6): via TOPICAL
  Filled 2017-11-05: qty 113

## 2017-11-05 MED ORDER — DIPHENHYDRAMINE HCL 12.5 MG/5ML PO ELIX: 12.5000 mg | ORAL_SOLUTION | Freq: Four times a day (QID) | ORAL | Status: DC | PRN

## 2017-11-05 MED ORDER — VANCOMYCIN 50 MG/ML ORAL SOLUTION: 125.0000 mg | Freq: Four times a day (QID) | ORAL | 0 refills | Status: DC

## 2017-11-05 MED ORDER — PROCHLORPERAZINE MALEATE 5 MG PO TABS
5.0000 mg | ORAL_TABLET | Freq: Four times a day (QID) | ORAL | Status: DC | PRN
  Administered 2017-11-10: 10 mg via ORAL
  Administered 2017-11-15: 5 mg via ORAL
  Filled 2017-11-05: qty 1
  Filled 2017-11-05: qty 2

## 2017-11-05 MED ORDER — TRAZODONE HCL 50 MG PO TABS
25.0000 mg | ORAL_TABLET | Freq: Every evening | ORAL | Status: DC | PRN
  Administered 2017-11-09 – 2017-11-10 (×3): 50 mg via ORAL
  Filled 2017-11-05 (×2): qty 1

## 2017-11-05 MED ORDER — BISACODYL 10 MG RE SUPP: 10.0000 mg | Freq: Every day | RECTAL | Status: DC | PRN

## 2017-11-05 MED ORDER — GLUCERNA SHAKE PO LIQD
237.0000 mL | Freq: Two times a day (BID) | ORAL | Status: DC
  Administered 2017-11-05 (×2): 237 mL via ORAL

## 2017-11-05 MED ORDER — NIFEDIPINE ER OSMOTIC RELEASE 30 MG PO TB24
30.0000 mg | ORAL_TABLET | Freq: Two times a day (BID) | ORAL | Status: DC
  Administered 2017-11-05 – 2017-11-10 (×10): 30 mg via ORAL
  Filled 2017-11-05 (×10): qty 1

## 2017-11-05 MED ORDER — FLEET ENEMA 7-19 GM/118ML RE ENEM: 1.0000 | ENEMA | Freq: Once | RECTAL | Status: DC | PRN

## 2017-11-05 MED ORDER — METOPROLOL TARTRATE 50 MG PO TABS
50.0000 mg | ORAL_TABLET | Freq: Two times a day (BID) | ORAL | Status: DC
  Administered 2017-11-05: 50 mg via ORAL
  Filled 2017-11-05 (×2): qty 1

## 2017-11-05 NOTE — Progress Notes (Signed)
Rehab admissions - I have talked to patient's husband by phone.  He does want inpatient rehab and he does understand the need for care at home while he is away from the home setting.  I will reach out to insurance case manager this am to confirm.  I do have approval for acute inpatient rehab admission for today.  I will confirm and update all as soon as possible.  Call me for questions.  #[phone]

## 2017-11-05 NOTE — H&P (Signed)
Physical Medicine and Rehabilitation Admission H&P       Chief Complaint  Patient presents with  . Debility     HPI:  Laura [name] is a 57 year old female(from [place]) with T2DM with neuropathy and retinopathy, medical HTN, medical non-compliance, CKD stage IV, admission to [facility] hospital 08/21/17 for nephrotic syndrome with fluid overload and encephalopathy. She was treated with HD briefly and extensive cardiac, renal and neurologic work up done revealing diffuse CAD, depression, urinary retention due to neurogenic bladder as well as  ADEM.  She was treated with steroids and discharged to [facility] for rehab on 10/07/17. Will likely need HD in the near future per notes. She was admitted to [facility] 10/30/17 with weakness and lethargy, loose stools and leucocytosis. She was started on IV rocephin due to concerns of urosepsis but  and diarrhea due to C diff colitis. BC and UCS negative but stools positive for C diff. She was started on oral vancomycin for treatment and has had decrease in loose stools. WOC following input on moisture associated skin damaged due to fecal and urinary incontinence. Po intake poor and multiple supplements added to help with malnutrition. She continues to have diffuse weakness query ciritcal illness myopathy as well as sever debility. CIR recommended due to functional deficits.    Review of Systems  Constitutional: Negative for chills and fever.  HENT: Negative for hearing loss and tinnitus.   Eyes: Positive for blurred vision.       Decreased vision right eye--question field cut  Respiratory: Negative for cough and shortness of breath.   Cardiovascular: Negative for chest pain and palpitations.  Gastrointestinal: Positive for diarrhea. Negative for heartburn and nausea.  Genitourinary: Positive for frequency and urgency.  Musculoskeletal: Negative for back pain and myalgias.  Skin: Negative for rash.  Neurological: Positive for tingling (bilateral  feet), sensory change, focal weakness and weakness. Negative for dizziness and headaches.  Psychiatric/Behavioral: Negative for depression. The patient is not nervous/anxious.           Past Medical History:  Diagnosis Date  . CHF (congestive heart failure) ([place])   . Chronic bronchitis ([place])   . High cholesterol   . Hypertension   . Type II diabetes mellitus ([place])          Past Surgical History:  Procedure Laterality Date  . CESAREAN SECTION    . TUBAL LIGATION           Family History  Problem Relation Age of Onset  . Diabetes Father      Social History:  Married. Independent prior to 08/2017.  Husband works 2nd shift. Has daughters in town. She reports that  has never smoked. she has never used smokeless tobacco. She reports that she does not drink alcohol or use drugs.          Allergies  Allergen Reactions  . Aspirin Hives  . Statins Other (See Comments)    Joint pain          Medications Prior to Admission  Medication Sig Dispense Refill  . acetaminophen (TYLENOL) 325 MG tablet Take 650 mg by mouth every 4 (four) hours as needed for mild pain.    Marland Kitchen aluminum-magnesium hydroxide-simethicone (MAALOX) 300-923-30 MG/5ML SUSP Take 60 mLs by mouth 4 (four) times daily -  before meals and at bedtime.    . bisacodyl (DULCOLAX) 5 MG EC tablet Take 10 mg by mouth daily as needed for moderate constipation.    . clopidogrel (  PLAVIX) 75 MG tablet Take 75 mg by mouth daily.    Marland Kitchen ezetimibe (ZETIA) 10 MG tablet Take 10 mg by mouth daily.    . furosemide (LASIX) 20 MG tablet Take 20 mg by mouth daily.    . insulin aspart (NOVOLOG) 100 UNIT/ML injection Inject 0-10 Units into the skin See admin instructions. If blood sugar is <70 call MD, 71-250=0, 251-300=2u, 301-350=4,351-400=6u, 401-500=8u, >500=10u and call MD    . insulin glargine (LANTUS) 100 UNIT/ML injection Inject 5 Units into the skin at bedtime.    . isosorbide mononitrate  (IMDUR) 60 MG 24 hr tablet Take 60 mg by mouth daily.    . metoprolol tartrate (LOPRESSOR) 50 MG tablet Take 50 mg by mouth 2 (two) times daily.    . mirtazapine (REMERON) 15 MG tablet Take 15 mg by mouth at bedtime.    . modafinil (PROVIGIL) 200 MG tablet Take 200 mg by mouth daily.    Marland Kitchen NIFEdipine (PROCARDIA-XL/ADALAT-CC/NIFEDICAL-XL) 30 MG 24 hr tablet Take 30 mg by mouth 2 (two) times daily.    . pantoprazole (PROTONIX) 40 MG tablet Take 40 mg by mouth daily.    Marland Kitchen PRESCRIPTION MEDICATION Take 120 mLs by mouth 2 (two) times daily. Med Pass    . simethicone (MYLICON) 80 MG chewable tablet Chew 80 mg by mouth 4 (four) times daily as needed for flatulence.    . venlafaxine XR (EFFEXOR-XR) 150 MG 24 hr capsule Take 150 mg by mouth daily with breakfast.    . venlafaxine XR (EFFEXOR-XR) 75 MG 24 hr capsule Take 75 mg by mouth daily with breakfast.      Drug Regimen Review  Drug regimen was reviewed and remains appropriate with no significant issues identified  Home: Home Living Family/patient expects to be discharged to:: Skilled nursing facility   Functional History: Prior Function Level of Independence: Needs assistance Gait / Transfers Assistance Needed: per chart review patient with limited ambulatory status at SNF ADL's / Homemaking Assistance Needed: pt reports she waws able to assist with tasks but requiring assistance for all ADL.   Functional Status:  Mobility: Bed Mobility Overal bed mobility: Needs Assistance Bed Mobility: Supine to Sit Supine to sit: Min assist General bed mobility comments: assist to elevate trunk to upright, light assist/facilitation to scoot to EOB in sitting Transfers Overall transfer level: Needs assistance Equipment used: None Transfers: Squat Pivot Transfers Sit to Stand: Total assist Squat pivot transfers: Max assist, Total assist General transfer comment: partial stand/squat pivot bed<>chair with PT blocking knees as pt  is too weak to WB through LEs  and  has poor trunk control  Ambulation/Gait General Gait Details: unable   ADL: ADL Overall ADL's : Needs assistance/impaired Eating/Feeding: Set up, Sitting Grooming: Minimal assistance, Sitting Upper Body Bathing: Moderate assistance, Sitting Lower Body Bathing: Total assistance, Sit to/from stand Upper Body Dressing : Moderate assistance, Sitting Lower Body Dressing: Total assistance, Sit to/from stand Toilet Transfer: Total assistance, Stand-pivot Toileting- Clothing Manipulation and Hygiene: Total assistance, Sit to/from stand Functional mobility during ADLs: Total assistance General ADL Comments: Pt able to take slight amount of weight through B LE to complete stand-pivot simulated toilet transfer but continues to require total assistance. Requires support for balance at EOB  Cognition: Cognition Overall Cognitive Status: No family/caregiver present to determine baseline cognitive functioning Orientation Level: Oriented X4 Cognition Arousal/Alertness: Awake/alert Behavior During Therapy: WFL for tasks assessed/performed Overall Cognitive Status: No family/caregiver present to determine baseline cognitive functioning Area of Impairment: Following commands, Safety/judgement,  Awareness, Problem solving Following Commands: Follows multi-step commands inconsistently Safety/Judgement: Decreased awareness of safety Awareness: Emergent Problem Solving: Difficulty sequencing, Requires verbal cues General Comments: Pt requiring verbal and tactile cues to follow multiple step commands. Pt demonstrating decreased awareness throughout session.    Blood pressure (!) 175/89, pulse 97, temperature 97.7 F (36.5 C), temperature source Oral, resp. rate 16, height 4' 11" (1.499 m), weight 54.9 kg (121 lb), SpO2 97 %. Physical Exam  Constitutional: She is oriented to person, place, and time. She appears well-developed and well-nourished.  HENT:  Head:  Normocephalic and atraumatic.  Eyes: Conjunctivae and EOM are normal. Pupils are equal, round, and reactive to light.  Neck: Normal range of motion. Neck supple.  Cardiovascular: Exam reveals no friction rub.  No murmur heard. Respiratory: Effort normal and breath sounds normal. No stridor. No respiratory distress. She has no wheezes. She has no rales.  GI: Soft. Bowel sounds are normal. She exhibits no distension. There is no tenderness.  Genitourinary:  Genitourinary Comments: Foley in place  Neurological: She is alert and oriented to person, place, and time.  Disoriented initally and was slow to process but improved with exam. She was able to follow simple motor commands. Tetraplegia with muscle wasting and bilateral foot drop. UE minus proximal to 3 out of 5 distally bilaterally.  Lower extremity she is 1+ hip flexion, 2- knee extension 3- plantar flexion and trace ankle dorsiflexion bilaterally.  She has decreased sensation in both feet in a stocking glove distribution.  Skin: Skin is warm.  Psychiatric:  Affect extremely flat but cooperative    LabResultsLast48Hours  Results for orders placed or performed during the hospital encounter of 10/30/17 (from the past 48 hour(s))  Glucose, capillary     Status: Abnormal   Collection Time: 11/03/17  5:08 PM  Result Value Ref Range   Glucose-Capillary 122 (H) 65 - 99 mg/dL  Glucose, capillary     Status: Abnormal   Collection Time: 11/03/17  9:12 PM  Result Value Ref Range   Glucose-Capillary 135 (H) 65 - 99 mg/dL  Basic metabolic panel     Status: Abnormal   Collection Time: 11/04/17  6:32 AM  Result Value Ref Range   Sodium 141 135 - 145 mmol/L   Potassium 4.9 3.5 - 5.1 mmol/L   Chloride 112 (H) 101 - 111 mmol/L   CO2 20 (L) 22 - 32 mmol/L   Glucose, Bld 137 (H) 65 - 99 mg/dL   BUN 51 (H) 6 - 20 mg/dL   Creatinine, Ser 2.18 (H) 0.44 - 1.00 mg/dL   Calcium 8.1 (L) 8.9 - 10.3 mg/dL   GFR calc non Af Amer 24 (L)  >60 mL/min   GFR calc Af Amer 28 (L) >60 mL/min    Comment: (NOTE) The eGFR has been calculated using the CKD EPI equation. This calculation has not been validated in all clinical situations. eGFR's persistently <60 mL/min signify possible Chronic Kidney Disease.    Anion gap 9 5 - 15  CBC     Status: Abnormal   Collection Time: 11/04/17  6:32 AM  Result Value Ref Range   WBC 14.5 (H) 4.0 - 10.5 K/uL   RBC 3.06 (L) 3.87 - 5.11 MIL/uL   Hemoglobin 8.3 (L) 12.0 - 15.0 g/dL   HCT 25.9 (L) 36.0 - 46.0 %   MCV 84.6 78.0 - 100.0 fL   MCH 27.1 26.0 - 34.0 pg   MCHC 32.0 30.0 - 36.0 g/dL   RDW  16.7 (H) 11.5 - 15.5 %   Platelets 550 (H) 150 - 400 K/uL  Glucose, capillary     Status: Abnormal   Collection Time: 11/04/17  7:57 AM  Result Value Ref Range   Glucose-Capillary 138 (H) 65 - 99 mg/dL  Glucose, capillary     Status: Abnormal   Collection Time: 11/04/17 12:54 PM  Result Value Ref Range   Glucose-Capillary 190 (H) 65 - 99 mg/dL  Glucose, capillary     Status: Abnormal   Collection Time: 11/04/17  4:43 PM  Result Value Ref Range   Glucose-Capillary 158 (H) 65 - 99 mg/dL  Glucose, capillary     Status: None   Collection Time: 11/04/17 10:12 PM  Result Value Ref Range   Glucose-Capillary 73 65 - 99 mg/dL  Basic metabolic panel     Status: Abnormal   Collection Time: 11/05/17  5:51 AM  Result Value Ref Range   Sodium 142 135 - 145 mmol/L   Potassium 4.8 3.5 - 5.1 mmol/L   Chloride 113 (H) 101 - 111 mmol/L   CO2 21 (L) 22 - 32 mmol/L   Glucose, Bld 81 65 - 99 mg/dL   BUN 52 (H) 6 - 20 mg/dL   Creatinine, Ser 2.05 (H) 0.44 - 1.00 mg/dL   Calcium 8.3 (L) 8.9 - 10.3 mg/dL   GFR calc non Af Amer 26 (L) >60 mL/min   GFR calc Af Amer 30 (L) >60 mL/min    Comment: (NOTE) The eGFR has been calculated using the CKD EPI equation. This calculation has not been validated in all clinical situations. eGFR's persistently <60 mL/min signify possible  Chronic Kidney Disease.    Anion gap 8 5 - 15    Comment: Performed at [facility] [address], [place], [place] [address]  CBC     Status: Abnormal   Collection Time: 11/05/17  5:51 AM  Result Value Ref Range   WBC 12.9 (H) 4.0 - 10.5 K/uL   RBC 2.87 (L) 3.87 - 5.11 MIL/uL   Hemoglobin 7.9 (L) 12.0 - 15.0 g/dL   HCT 24.4 (L) 36.0 - 46.0 %   MCV 85.0 78.0 - 100.0 fL   MCH 27.5 26.0 - 34.0 pg   MCHC 32.4 30.0 - 36.0 g/dL   RDW 17.0 (H) 11.5 - 15.5 %   Platelets 527 (H) 150 - 400 K/uL    Comment: Performed at [facility] [address] [place], [place] [address]  Glucose, capillary     Status: None   Collection Time: 11/05/17  7:52 AM  Result Value Ref Range   Glucose-Capillary 87 65 - 99 mg/dL  Glucose, capillary     Status: Abnormal   Collection Time: 11/05/17 12:10 PM  Result Value Ref Range   Glucose-Capillary 119 (H) 65 - 99 mg/dL     ImagingResults(Last48hours)  No results found.       Medical Problem List and Plan: 1.  Functional and mobility deficits secondary to debility and encephalopathy from multiple medical issues             -admit to inpatient rehab 2.  DVT Prophylaxis/Anticoagulation: Pharmaceutical: Lovenox 3. Pain Management: tylenol prn 4. Mood: LCSW to follow for evaluation and support.  5. Neuropsych: This patient is not fully capable of making decisions on her own behalf. 6. Skin/Wound Care: Air mattress for pressure relief measures. Local measures to help manage sacral Flossie Dibble MASD             -  frequent toileting to help with continence.              -PRAFO's for bilateral LE's 7. Fluids/Electrolytes/Nutrition: Monitor I/O. Check lytes in am. Continue to offer supplements to help with protein calorie malnutrition.              -consider appetite stimulant 8. C diff colitis: Continue Vancomycin 111m po q6 hours initiated 1/27 for 10 doses (through 2/5)             -continue contact precautions              -stools are improving 9. HTN: Monitor BP bid. Poorly controlled at this time. Continue nifedipine bid, Imdur daily and metoprolol bid. 10 T2DM with retinopathy, neuropathy, and nephropathy:Monitor BS ac/hs. Continue Lantus 5 units with SSI for elevated BS             -encourage regular po intake to help maintain more stable CBG readings 11. H/o depression: Continue Effexor and Remeron.  12. Neurogenic bladder: Has chronic indwelling catheter for 2 months +. Continue foley due to incontinence and for wound healing.   13. AKI on CKD with history of overload: Monitor daily weights for trends, for signs of overload as well as serial check of lytes.  14. Diffuse CAD: treated medically with Metoprolol, Zetia and Plavix.    Post Admission Physician Evaluation: 1. Functional deficits secondary  to critical illness myopathy and encephalopathy after multiple medical issues. 2. Patient is admitted to receive collaborative, interdisciplinary care between the physiatrist, rehab nursing staff, and therapy team. 3. Patient's level of medical complexity and substantial therapy needs in context of that medical necessity cannot be provided at a lesser intensity of care such as a SNF. 4. Patient has experienced substantial functional loss from his/her baseline which was documented above under the "Functional History" and "Functional Status" headings.  Judging by the patient's diagnosis, physical exam, and functional history, the patient has potential for functional progress which will result in measurable gains while on inpatient rehab.  These gains will be of substantial and practical use upon discharge  in facilitating mobility and self-care at the household level. 5. Physiatrist will provide 24 hour management of medical needs as well as oversight of the therapy plan/treatment and provide guidance as appropriate regarding the interaction of the two. 6. The Preadmission Screening has been reviewed and patient  status is unchanged unless otherwise stated above. 7. 24 hour rehab nursing will assist with bladder management, bowel management, safety, skin/wound care, disease management, medication administration, pain management and patient education  and help integrate therapy concepts, techniques,education, etc. 8. PT will assess and treat for/with: Lower extremity strength, range of motion, stamina, balance, functional mobility, safety, adaptive techniques and equipment, NMR, cognitive perceptual rx, family ed.   Goals are: min to mod assist. 9. OT will assess and treat for/with: ADL's, functional mobility, safety, upper extremity strength, adaptive techniques and equipment, NMR, family ed .   Goals are: min to mod assist. Therapy may proceed with showering this patient. 10. SLP will assess and treat for/with: cognition, communication.  Goals are: supervision. 11. Case Management and Social Worker will assess and treat for psychological issues and discharge planning. 12. Team conference will be held weekly to assess progress toward goals and to determine barriers to discharge. 13. Patient will receive at least 3 hours of therapy per day at least 5 days per week. 14. ELOS: 18-23 days       15. Prognosis:  excellent  Meredith Staggers, MD, [place] 11/05/2017  Bary Leriche, PA-C 11/05/2017

## 2017-11-05 NOTE — H&P (Signed)
Physical Medicine and Rehabilitation Admission H&P    Chief Complaint  Patient presents with  . Debility     HPI:  Laura [name] is a 57 year old female(from [place]) with T2DM with neuropathy and retinopathy, medical HTN, medical non-compliance, CKD stage IV, admission to [facility] hospital 08/21/17 for nephrotic syndrome with fluid overload and encephalopathy. She was treated with HD briefly and extensive cardiac, renal and neurologic work up done revealing diffuse CAD, depression, urinary retention due to neurogenic bladder as well as  ADEM.  She was treated with steroids and discharged to [facility] for rehab on 10/07/17. Will likely need HD in the near future per notes. She was admitted to [facility] 10/30/17 with weakness and lethargy, loose stools and leucocytosis. She was started on IV rocephin due to concerns of urosepsis but  and diarrhea due to C diff colitis. BC and UCS negative but stools positive for C diff. She was started on oral vancomycin for treatment and has had decrease in loose stools. WOC following input on moisture associated skin damaged due to fecal and urinary incontinence. Po intake poor and multiple supplements added to help with malnutrition. She continues to have diffuse weakness query ciritcal illness myopathy as well as sever debility. CIR recommended due to functional deficits.    Review of Systems  Constitutional: Negative for chills and fever.  HENT: Negative for hearing loss and tinnitus.   Eyes: Positive for blurred vision.       Decreased vision right eye--question field cut  Respiratory: Negative for cough and shortness of breath.   Cardiovascular: Negative for chest pain and palpitations.  Gastrointestinal: Positive for diarrhea. Negative for heartburn and nausea.  Genitourinary: Positive for frequency and urgency.  Musculoskeletal: Negative for back pain and myalgias.  Skin: Negative for rash.  Neurological: Positive for tingling (bilateral feet),  sensory change, focal weakness and weakness. Negative for dizziness and headaches.  Psychiatric/Behavioral: Negative for depression. The patient is not nervous/anxious.       Past Medical History:  Diagnosis Date  . CHF (congestive heart failure) ([place])   . Chronic bronchitis ([place])   . High cholesterol   . Hypertension   . Type II diabetes mellitus ([place])     Past Surgical History:  Procedure Laterality Date  . CESAREAN SECTION    . TUBAL LIGATION      Family History  Problem Relation Age of Onset  . Diabetes Father      Social History:  Married. Independent prior to 08/2017.  Husband works 2nd shift. Has daughters in town. She reports that  has never smoked. she has never used smokeless tobacco. She reports that she does not drink alcohol or use drugs.     Allergies  Allergen Reactions  . Aspirin Hives  . Statins Other (See Comments)    Joint pain    Medications Prior to Admission  Medication Sig Dispense Refill  . acetaminophen (TYLENOL) 325 MG tablet Take 650 mg by mouth every 4 (four) hours as needed for mild pain.    Marland Kitchen aluminum-magnesium hydroxide-simethicone (MAALOX) [phone] MG/5ML SUSP Take 60 mLs by mouth 4 (four) times daily -  before meals and at bedtime.    . bisacodyl (DULCOLAX) 5 MG EC tablet Take 10 mg by mouth daily as needed for moderate constipation.    . clopidogrel (PLAVIX) 75 MG tablet Take 75 mg by mouth daily.    Marland Kitchen ezetimibe (ZETIA) 10 MG tablet Take 10 mg by mouth daily.    Marland Kitchen  furosemide (LASIX) 20 MG tablet Take 20 mg by mouth daily.    . insulin aspart (NOVOLOG) 100 UNIT/ML injection Inject 0-10 Units into the skin See admin instructions. If blood sugar is <70 call MD, 71-250=0, 251-300=2u, 301-350=4,351-400=6u, 401-500=8u, >500=10u and call MD    . insulin glargine (LANTUS) 100 UNIT/ML injection Inject 5 Units into the skin at bedtime.    . isosorbide mononitrate (IMDUR) 60 MG 24 hr tablet Take 60 mg by mouth daily.    . metoprolol tartrate  (LOPRESSOR) 50 MG tablet Take 50 mg by mouth 2 (two) times daily.    . mirtazapine (REMERON) 15 MG tablet Take 15 mg by mouth at bedtime.    . modafinil (PROVIGIL) 200 MG tablet Take 200 mg by mouth daily.    Marland Kitchen NIFEdipine (PROCARDIA-XL/ADALAT-CC/NIFEDICAL-XL) 30 MG 24 hr tablet Take 30 mg by mouth 2 (two) times daily.    . pantoprazole (PROTONIX) 40 MG tablet Take 40 mg by mouth daily.    Marland Kitchen PRESCRIPTION MEDICATION Take 120 mLs by mouth 2 (two) times daily. Med Pass    . simethicone (MYLICON) 80 MG chewable tablet Chew 80 mg by mouth 4 (four) times daily as needed for flatulence.    . venlafaxine XR (EFFEXOR-XR) 150 MG 24 hr capsule Take 150 mg by mouth daily with breakfast.    . venlafaxine XR (EFFEXOR-XR) 75 MG 24 hr capsule Take 75 mg by mouth daily with breakfast.      Drug Regimen Review  Drug regimen was reviewed and remains appropriate with no significant issues identified  Home: Home Living Family/patient expects to be discharged to:: Skilled nursing facility   Functional History: Prior Function Level of Independence: Needs assistance Gait / Transfers Assistance Needed: per chart review patient with limited ambulatory status at SNF ADL's / Homemaking Assistance Needed: pt reports she waws able to assist with tasks but requiring assistance for all ADL.   Functional Status:  Mobility: Bed Mobility Overal bed mobility: Needs Assistance Bed Mobility: Supine to Sit Supine to sit: Min assist General bed mobility comments: assist to elevate trunk to upright, light assist/facilitation to scoot to EOB in sitting Transfers Overall transfer level: Needs assistance Equipment used: None Transfers: Squat Pivot Transfers Sit to Stand: Total assist Squat pivot transfers: Max assist, Total assist General transfer comment: partial stand/squat pivot bed<>chair with PT blocking knees as pt is too weak to WB through LEs  and  has poor trunk control  Ambulation/Gait General Gait Details:  unable     ADL: ADL Overall ADL's : Needs assistance/impaired Eating/Feeding: Set up, Sitting Grooming: Minimal assistance, Sitting Upper Body Bathing: Moderate assistance, Sitting Lower Body Bathing: Total assistance, Sit to/from stand Upper Body Dressing : Moderate assistance, Sitting Lower Body Dressing: Total assistance, Sit to/from stand Toilet Transfer: Total assistance, Stand-pivot Toileting- Clothing Manipulation and Hygiene: Total assistance, Sit to/from stand Functional mobility during ADLs: Total assistance General ADL Comments: Pt able to take slight amount of weight through B LE to complete stand-pivot simulated toilet transfer but continues to require total assistance. Requires support for balance at EOB  Cognition: Cognition Overall Cognitive Status: No family/caregiver present to determine baseline cognitive functioning Orientation Level: Oriented X4 Cognition Arousal/Alertness: Awake/alert Behavior During Therapy: WFL for tasks assessed/performed Overall Cognitive Status: No family/caregiver present to determine baseline cognitive functioning Area of Impairment: Following commands, Safety/judgement, Awareness, Problem solving Following Commands: Follows multi-step commands inconsistently Safety/Judgement: Decreased awareness of safety Awareness: Emergent Problem Solving: Difficulty sequencing, Requires verbal cues General Comments: Pt requiring  verbal and tactile cues to follow multiple step commands. Pt demonstrating decreased awareness throughout session.    Blood pressure (!) 175/89, pulse 97, temperature 97.7 F (36.5 C), temperature source Oral, resp. rate 16, height 4' 11"  (1.499 m), weight 54.9 kg (121 lb), SpO2 97 %. Physical Exam  Constitutional: She is oriented to person, place, and time. She appears well-developed and well-nourished.  HENT:  Head: Normocephalic and atraumatic.  Eyes: Conjunctivae and EOM are normal. Pupils are equal, round, and  reactive to light.  Neck: Normal range of motion. Neck supple.  Cardiovascular: Exam reveals no friction rub.  No murmur heard. Respiratory: Effort normal and breath sounds normal. No stridor. No respiratory distress. She has no wheezes. She has no rales.  GI: Soft. Bowel sounds are normal. She exhibits no distension. There is no tenderness.  Genitourinary:  Genitourinary Comments: Foley in place  Neurological: She is alert and oriented to person, place, and time.  Disoriented initally and was slow to process but improved with exam. She was able to follow simple motor commands. Tetraplegia with muscle wasting and bilateral foot drop. UE minus proximal to 3 out of 5 distally bilaterally.  Lower extremity she is 1+ hip flexion, 2- knee extension 3- plantar flexion and trace ankle dorsiflexion bilaterally.  She has decreased sensation in both feet in a stocking glove distribution.  Skin: Skin is warm.  Psychiatric:  Affect extremely flat but cooperative    Results for orders placed or performed during the hospital encounter of 10/30/17 (from the past 48 hour(s))  Glucose, capillary     Status: Abnormal   Collection Time: 11/03/17  5:08 PM  Result Value Ref Range   Glucose-Capillary 122 (H) 65 - 99 mg/dL  Glucose, capillary     Status: Abnormal   Collection Time: 11/03/17  9:12 PM  Result Value Ref Range   Glucose-Capillary 135 (H) 65 - 99 mg/dL  Basic metabolic panel     Status: Abnormal   Collection Time: 11/04/17  6:32 AM  Result Value Ref Range   Sodium 141 135 - 145 mmol/L   Potassium 4.9 3.5 - 5.1 mmol/L   Chloride 112 (H) 101 - 111 mmol/L   CO2 20 (L) 22 - 32 mmol/L   Glucose, Bld 137 (H) 65 - 99 mg/dL   BUN 51 (H) 6 - 20 mg/dL   Creatinine, Ser 2.18 (H) 0.44 - 1.00 mg/dL   Calcium 8.1 (L) 8.9 - 10.3 mg/dL   GFR calc non Af Amer 24 (L) >60 mL/min   GFR calc Af Amer 28 (L) >60 mL/min    Comment: (NOTE) The eGFR has been calculated using the CKD EPI equation. This calculation  has not been validated in all clinical situations. eGFR's persistently <60 mL/min signify possible Chronic Kidney Disease.    Anion gap 9 5 - 15  CBC     Status: Abnormal   Collection Time: 11/04/17  6:32 AM  Result Value Ref Range   WBC 14.5 (H) 4.0 - 10.5 K/uL   RBC 3.06 (L) 3.87 - 5.11 MIL/uL   Hemoglobin 8.3 (L) 12.0 - 15.0 g/dL   HCT 25.9 (L) 36.0 - 46.0 %   MCV 84.6 78.0 - 100.0 fL   MCH 27.1 26.0 - 34.0 pg   MCHC 32.0 30.0 - 36.0 g/dL   RDW 16.7 (H) 11.5 - 15.5 %   Platelets 550 (H) 150 - 400 K/uL  Glucose, capillary     Status: Abnormal   Collection Time: 11/04/17  7:57 AM  Result Value Ref Range   Glucose-Capillary 138 (H) 65 - 99 mg/dL  Glucose, capillary     Status: Abnormal   Collection Time: 11/04/17 12:54 PM  Result Value Ref Range   Glucose-Capillary 190 (H) 65 - 99 mg/dL  Glucose, capillary     Status: Abnormal   Collection Time: 11/04/17  4:43 PM  Result Value Ref Range   Glucose-Capillary 158 (H) 65 - 99 mg/dL  Glucose, capillary     Status: None   Collection Time: 11/04/17 10:12 PM  Result Value Ref Range   Glucose-Capillary 73 65 - 99 mg/dL  Basic metabolic panel     Status: Abnormal   Collection Time: 11/05/17  5:51 AM  Result Value Ref Range   Sodium 142 135 - 145 mmol/L   Potassium 4.8 3.5 - 5.1 mmol/L   Chloride 113 (H) 101 - 111 mmol/L   CO2 21 (L) 22 - 32 mmol/L   Glucose, Bld 81 65 - 99 mg/dL   BUN 52 (H) 6 - 20 mg/dL   Creatinine, Ser 2.05 (H) 0.44 - 1.00 mg/dL   Calcium 8.3 (L) 8.9 - 10.3 mg/dL   GFR calc non Af Amer 26 (L) >60 mL/min   GFR calc Af Amer 30 (L) >60 mL/min    Comment: (NOTE) The eGFR has been calculated using the CKD EPI equation. This calculation has not been validated in all clinical situations. eGFR's persistently <60 mL/min signify possible Chronic Kidney Disease.    Anion gap 8 5 - 15    Comment: Performed at [facility] [address] [place], [place] [address]  CBC     Status: Abnormal   Collection  Time: 11/05/17  5:51 AM  Result Value Ref Range   WBC 12.9 (H) 4.0 - 10.5 K/uL   RBC 2.87 (L) 3.87 - 5.11 MIL/uL   Hemoglobin 7.9 (L) 12.0 - 15.0 g/dL   HCT 24.4 (L) 36.0 - 46.0 %   MCV 85.0 78.0 - 100.0 fL   MCH 27.5 26.0 - 34.0 pg   MCHC 32.4 30.0 - 36.0 g/dL   RDW 17.0 (H) 11.5 - 15.5 %   Platelets 527 (H) 150 - 400 K/uL    Comment: Performed at [facility], [place] [address], [place], [place] [address]  Glucose, capillary     Status: None   Collection Time: 11/05/17  7:52 AM  Result Value Ref Range   Glucose-Capillary 87 65 - 99 mg/dL  Glucose, capillary     Status: Abnormal   Collection Time: 11/05/17 12:10 PM  Result Value Ref Range   Glucose-Capillary 119 (H) 65 - 99 mg/dL   No results found.     Medical Problem List and Plan: 1.  Functional and mobility deficits secondary to debility and encephalopathy from multiple medical issues  -admit to inpatient rehab 2.  DVT Prophylaxis/Anticoagulation: Pharmaceutical: Lovenox 3. Pain Management: tylenol prn 4. Mood: LCSW to follow for evaluation and support.  5. Neuropsych: This patient is not fully capable of making decisions on her own behalf. 6. Skin/Wound Care: Air mattress for pressure relief measures. Local measures to help manage sacral Flossie Dibble MASD   -frequent toileting to help with continence.    -PRAFO's for bilateral LE's 7. Fluids/Electrolytes/Nutrition: Monitor I/O. Check lytes in am. Continue to offer supplements to help with protein calorie malnutrition.   -consider appetite stimulant 8. C diff colitis: Continue Vancomycin 122m po q6 hours initiated 1/27 for 10 doses (through 2/5)   -  continue contact precautions   -stools are improving 9. HTN: Monitor BP bid. Poorly controlled at this time. Continue nifedipine bid, Imdur daily and metoprolol bid. 10 T2DM with retinopathy, neuropathy, and nephropathy:Monitor BS ac/hs. Continue Lantus 5 units with SSI for elevated BS   -encourage regular po intake to help  maintain more stable CBG readings 11. H/o depression: Continue Effexor and Remeron.  12. Neurogenic bladder: Has chronic indwelling catheter for 2 months +. Continue foley due to incontinence and for wound healing.   13. AKI on CKD with history of overload: Monitor daily weights for trends, for signs of overload as well as serial check of lytes.  14. Diffuse CAD: treated medically with Metoprolol, Zetia and Plavix.    Post Admission Physician Evaluation: 1. Functional deficits secondary  to critical illness myopathy and encephalopathy after multiple medical issues. 2. Patient is admitted to receive collaborative, interdisciplinary care between the physiatrist, rehab nursing staff, and therapy team. 3. Patient's level of medical complexity and substantial therapy needs in context of that medical necessity cannot be provided at a lesser intensity of care such as a SNF. 4. Patient has experienced substantial functional loss from his/her baseline which was documented above under the "Functional History" and "Functional Status" headings.  Judging by the patient's diagnosis, physical exam, and functional history, the patient has potential for functional progress which will result in measurable gains while on inpatient rehab.  These gains will be of substantial and practical use upon discharge  in facilitating mobility and self-care at the household level. 5. Physiatrist will provide 24 hour management of medical needs as well as oversight of the therapy plan/treatment and provide guidance as appropriate regarding the interaction of the two. 6. The Preadmission Screening has been reviewed and patient status is unchanged unless otherwise stated above. 7. 24 hour rehab nursing will assist with bladder management, bowel management, safety, skin/wound care, disease management, medication administration, pain management and patient education  and help integrate therapy concepts, techniques,education, etc. 8. PT  will assess and treat for/with: Lower extremity strength, range of motion, stamina, balance, functional mobility, safety, adaptive techniques and equipment, NMR, cognitive perceptual rx, family ed.   Goals are: min to mod assist. 9. OT will assess and treat for/with: ADL's, functional mobility, safety, upper extremity strength, adaptive techniques and equipment, NMR, family ed .   Goals are: min to mod assist. Therapy may proceed with showering this patient. 10. SLP will assess and treat for/with: cognition, communication.  Goals are: supervision. 11. Case Management and Social Worker will assess and treat for psychological issues and discharge planning. 12. Team conference will be held weekly to assess progress toward goals and to determine barriers to discharge. 13. Patient will receive at least 3 hours of therapy per day at least 5 days per week. 14. ELOS: 18-23 days       15. Prognosis:  excellent     Meredith Staggers, MD, [place] 11/05/2017  Bary Leriche, PA-C 11/05/2017

## 2017-11-05 NOTE — Social Work (Signed)
CSW aware that pt has been accepted at [facility], pending bed and confirmation from insurance company for post discharge care plans.   CSW signing off. Please consult if any additional needs arise or if SNF is indeed needed.  [place], [place] Work [phone]

## 2017-11-05 NOTE — Progress Notes (Signed)
Patient was informed about rehab process including patient safety plan and rehab booklet. 

## 2017-11-05 NOTE — Progress Notes (Signed)
Brief Nutrition Follow-Up Note  Case discussed with RN, who reports concern over elevated blood sugars. Pt is eating better and staff continues to assist her with meals. Meal completion variable; PO: 25-100%. RN shares that pt family has also bringing in [place] food for pt to consume, which she does eat. RN has already trial Glucerna, which pt accepts well. Will modify supplement from Ensure Enlive po BID, each supplement provides 350 kcal and 20 grams of protein to Glucerna Shake po BID, each supplement provides 220 kcal and 10 grams of protein, in light of increased intake due to outside food consumption. Will also continue 30 ml Prostat BID.   CGS: 73-190. Inpatient orders for glycemic control are 0-9 units insulin aspart TID and 5 units insulin glargine daily.   RD will continue to follow.   Jocelynne Duquette A. Jimmye Norman, RD, LDN, CDE Pager: [phone] After hours Pager: [phone]

## 2017-11-05 NOTE — Discharge Instructions (Addendum)
Laura [name],  You were treated for an infection of your bowels. Please continue taking oral vancomycin for the next 5 days.  Please schedule a follow up with your primary doctor as soon as possible.

## 2017-11-05 NOTE — Progress Notes (Signed)
Patient transferred to [phone], report given to nurse [facility]. All belongings sent with patient.

## 2017-11-05 NOTE — PMR Pre-admission (Signed)
PMR Admission Coordinator Pre-Admission Assessment  Patient: Laura [name] is an 57 y.o., female MRN: [id number] DOB: [birth year] Height: 4' 11"  (149.9 cm) Weight: 54.9 kg (121 lb)            Insurance Information HMO:       PPO:       PCP:       IPA:       80/20:       OTHER: [employer] PRIMARY: [facility] with [employer]      Policy#: [id number]      Subscriber:  Laura [name] - spouse CM Name: Laura [name]      Phone#:  [phone] X [phone]     Fax#:  [phone] Pre-Cert#:  [id number] from 2/1 to 11/09/17 with updates due 11/09/17      Employer:  [employer] - spouse Benefits:  Phone #:  [phone]     Name:  Laura [name]. Date: 08/09/17     Deduct:  $3000 (met $752.41      Out of Pocket Max:  $5400 (met $752.41)      Life Max: N/A CIR: 80% with auth      SNF: 80% with 120 days max Outpatient: 80%     Co-Pay: 20% Home Health: 80% with 120 visits/year      Co-Pay: 20% DME: 80%     Co-Pay: 20% Providers: in network  Medicaid Application Date:        Case Manager:   Disability Application Date:        Case Worker:    Emergency Facilities manager Information    Name Relation Home Work Mobile   [name],Laura Spouse   [phone]   Kacia, Halley Daughter   [phone]     Current Medical History  Patient Admitting Diagnosis: Debility   History of Present Illness: A 57 year old female (from [place]) with T2DM with neuropathy and retinopathy, medical HTN, medical non-compliance, CKD stage IV, admission to [facility] hospital 08/21/17 for nephrotic syndrome with fluid overload and encephalopathy. She was treated with HD briefly and extensive cardiac, renal and neurologic work up done revealing diffuse CAD, depression, urinary retention due to neurogenic bladder as well as  ADEM.  She was treated with steroids and discharged to [facility] for rehab on 10/07/17. Will likely need HD in the near future per notes. She was admitted to [facility] 10/30/17 with  weakness and lethargy, loose stools and leucocytosis. She was started on IV rocephin due to concerns of urosepsis and bouts of diarrhea due to C diff colitis. BC and UCS negative but stools positive for C diff. She was started on oral vancomycin for treatment and has had decrease in loose stools. WOC following input on  MASD due to fecal and urinary incontinence. Po intake poor and multiple supplements added to help with malnutrition. She continues to have diffuse weakness query ciritcal illness myopathy as well as severe debility. CIR recommended due to functional deficits.   Past Medical History  Past Medical History:  Diagnosis Date  . CHF (congestive heart failure) ([place])   . Chronic bronchitis ([place])   . High cholesterol   . Hypertension   . Type II diabetes mellitus (HCC)     Family History  family history includes Diabetes in her father.  Prior Rehab/Hospitalizations: Was admitted to [facility] 08/21/17 and then to [facility] on 10/07/17.  Has the patient had major surgery during 100 days prior to admission? No  Current Medications   Current  Facility-Administered Medications:  .  clopidogrel (PLAVIX) tablet 75 mg, 75 mg, Oral, Daily, Ledell Noss, MD, 75 mg at 11/05/17 2010 .  dextrose 50 % solution 50 mL, 1 ampule, Intravenous, BID PRN, Ledell Noss, MD, 25 mL at 10/31/17 0712 .  ezetimibe (ZETIA) tablet 10 mg, 10 mg, Oral, Daily, Ledell Noss, MD, 10 mg at 11/05/17 [phone] .  feeding supplement (GLUCERNA SHAKE) (GLUCERNA SHAKE) liquid 237 mL, 237 mL, Oral, BID BM, Narendra, Nischal, MD, 237 mL at 11/05/17 1430 .  feeding supplement (PRO-STAT SUGAR FREE 64) liquid 30 mL, 30 mL, Oral, BID, Axel Filler, MD, 30 mL at 11/05/17 [phone] .  Gerhardt's butt cream, , Topical, QID, Axel Filler, MD .  heparin injection 5,000 Units, 5,000 Units, Subcutaneous, Q8H, Ledell Noss, MD, 5,000 Units at 11/05/17 1429 .  hydrocerin (EUCERIN) cream, , Topical, BID, Axel Filler, MD .   insulin aspart (novoLOG) injection 0-9 Units, 0-9 Units, Subcutaneous, TID WC, Ina Homes, MD, 2 Units at 11/04/17 1742 .  insulin glargine (LANTUS) injection 5 Units, 5 Units, Subcutaneous, Daily, Ledell Noss, MD, 5 Units at 11/05/17 0940 .  isosorbide mononitrate (IMDUR) 24 hr tablet 60 mg, 60 mg, Oral, Daily, Ledell Noss, MD, 60 mg at 11/05/17 5498 .  metoprolol tartrate (LOPRESSOR) tablet 50 mg, 50 mg, Oral, BID, Ledell Noss, MD, 50 mg at 11/05/17 2641 .  mirtazapine (REMERON) tablet 15 mg, 15 mg, Oral, QHS, Ledell Noss, MD, 15 mg at 11/04/17 2210 .  NIFEdipine (PROCARDIA-XL/ADALAT-CC/NIFEDICAL-XL) 24 hr tablet 30 mg, 30 mg, Oral, BID, Ledell Noss, MD, 30 mg at 11/05/17 5830 .  nystatin cream (MYCOSTATIN), , Topical, BID, Santos-Sanchez, Idalys, MD .  vancomycin (VANCOCIN) 50 mg/mL oral solution 125 mg, 125 mg, Oral, Q6H, Santos-Sanchez, Idalys, MD, 125 mg at 11/05/17 1235 .  venlafaxine XR (EFFEXOR-XR) 24 hr capsule 150 mg, 150 mg, Oral, Q breakfast, Ledell Noss, MD, 150 mg at 11/05/17 9407  Patients Current Diet: Diet Carb Modified Fluid consistency: Thin; Room service appropriate? Yes Diet - low sodium heart healthy  Precautions / Restrictions Precautions Precautions: Fall Restrictions Weight Bearing Restrictions: No   Has the patient had 2 or more falls or a fall with injury in the past year?No.  Husband reports that she slipped and slid down the stairs X 1 in the recent past with no injury.  Prior Activity Level Household: Went out 3 to 4 times a week prior to 08/21/17  Home Assistive Devices / Equipment Home Assistive Devices/Equipment: None  Prior Device Use: Indicate devices/aids used by the patient prior to current illness, exacerbation or injury? None  Prior Functional Level Prior Function Level of Independence: Needs assistance Gait / Transfers Assistance Needed: per chart review patient with limited ambulatory status at SNF ADL's / Homemaking Assistance Needed: pt  reports she waws able to assist with tasks but requiring assistance for all ADL.   Self Care: Did the patient need help bathing, dressing, using the toilet or eating?  Independent  Indoor Mobility: Did the patient need assistance with walking from room to room (with or without device)? Independent  Stairs: Did the patient need assistance with internal or external stairs (with or without device)? Independent  Functional Cognition: Did the patient need help planning regular tasks such as shopping or remembering to take medications? Independent  Current Functional Level Cognition  Overall Cognitive Status: No family/caregiver present to determine baseline cognitive functioning Orientation Level: Oriented X4 Following Commands: Follows multi-step commands inconsistently Safety/Judgement: Decreased awareness of safety  General Comments: Pt requiring verbal and tactile cues to follow multiple step commands. Pt demonstrating decreased awareness throughout session.     Extremity Assessment (includes Sensation/Coordination)  Upper Extremity Assessment: Generalized weakness  Lower Extremity Assessment: Generalized weakness    ADLs  Overall ADL's : Needs assistance/impaired Eating/Feeding: Set up, Sitting Grooming: Minimal assistance, Sitting Upper Body Bathing: Moderate assistance, Sitting Lower Body Bathing: Total assistance, Sit to/from stand Upper Body Dressing : Moderate assistance, Sitting Lower Body Dressing: Total assistance, Sit to/from stand Toilet Transfer: Total assistance, Stand-pivot Toileting- Clothing Manipulation and Hygiene: Total assistance, Sit to/from stand Functional mobility during ADLs: Total assistance General ADL Comments: Pt able to take slight amount of weight through B LE to complete stand-pivot simulated toilet transfer but continues to require total assistance. Requires support for balance at EOB    Mobility  Overal bed mobility: Needs Assistance Bed  Mobility: Supine to Sit Supine to sit: Min assist General bed mobility comments: assist to elevate trunk to upright, light assist/facilitation to scoot to EOB in sitting    Transfers  Overall transfer level: Needs assistance Equipment used: None Transfers: Squat Pivot Transfers Sit to Stand: Total assist Squat pivot transfers: Max assist, Total assist General transfer comment: partial stand/squat pivot bed<>chair with PT blocking knees as pt is too weak to WB through LEs  and  has poor trunk control     Ambulation / Gait / Stairs / Wheelchair Mobility  Ambulation/Gait General Gait Details: unable     Posture / Balance Dynamic Sitting Balance Sitting balance - Comments: pt is able to maintain static sit without UE support and close supervision ~ 28mns, requires UE support for dynamic balance Balance Overall balance assessment: Needs assistance Sitting-balance support: Bilateral upper extremity supported, Feet supported Sitting balance-Leahy Scale: Fair Sitting balance - Comments: pt is able to maintain static sit without UE support and close supervision ~ 259ms, requires UE support for dynamic balance Standing balance support: During functional activity Standing balance-Leahy Scale: Zero Standing balance comment: pt is unable to stand and WB today without maximal support from therapist    Special needs/care consideration BiPAP/CPAP No CPM No Continuous Drip IV No Dialysis Not at this time       Life Vest No Oxygen No Special Bed No Trach Size NO Wound Vac (area) No      Skin Has a coccyx wound                             Bowel mgmt: Last BM 10/07/17, incontinence Bladder mgmt: Urinary catheter Diabetic mgmt Yes, on insulin at home Enteric precautions: Yes   [facility]  Home Care Services: No  Discharge Living Setting Plans for Discharge Living Setting: Patient's home, House, Lives with (comment)(Lives with husband.) Type of  Home at Discharge: House Discharge Home Layout: Two level, 1/2 bath on main level, Bed/bath upstairs Alternate Level Stairs-Number of Steps: 16 steps Discharge Home Access: Stairs to enter Entrance Stairs-Number of Steps: 1 step entry which is 2-3 inch step. Does the patient have any problems obtaining your medications?: No  Social/Family/Support Systems Patient Roles: Spouse, Parent(Has a husband and 3 children.) Contact Information: MiNastassia [name] spouse Anticipated Caregiver: Husband, daughter, others Anticipated Caregiver's Contact Information: MiLegrand [name] husband - [phone]/Limitations of Caregiver: Husband works 2nd shift [address].  Has an 1860o daughter who can assist some; she goes to school.  Her 57  yo dtr is expecting and lives close by. Caregiver Availability: Other (Comment)(Husband is aware of need for 24/7 care after rehab discharge) Discharge Plan Discussed with Primary Caregiver: Yes Is Caregiver In Agreement with Plan?: Yes Does Caregiver/Family have Issues with Lodging/Transportation while Pt is in Rehab?: No  Goals/Additional Needs Patient/Family Goal for Rehab: PT/OT min to mod assist, SLP supervision to min assist goals Expected length of stay: 18-23 days Cultural Considerations: [employer].  From the [place].  Speaks English. Dietary Needs: Carb mod, med cal, thin liquids Equipment Needs: TBD Pt/Family Agrees to Admission and willing to participate: Yes Program Orientation Provided & Reviewed with Pt/Caregiver Including Roles  & Responsibilities: Yes  Decrease burden of Care through IP rehab admission: N/A  Possible need for SNF placement upon discharge: Yes, if patient cannot progress to level where family can manage at home.  Husband is aware that there is no guarantee for SNF from [facility]  Case manager did say that they would consider SNF based on patient progress.  Patient Condition: This patient's medical and functional status  has changed since the consult dated: 11/03/17 in which the Rehabilitation Physician determined and documented that the patient's condition is appropriate for intensive rehabilitative care in an inpatient rehabilitation facility. See "History of Present Illness" (above) for medical update. Functional changes are: Currently requiring max to total assist for squat pivot transfers. Patient's medical and functional status update has been discussed with the Rehabilitation physician and patient remains appropriate for inpatient rehabilitation. Will admit to inpatient rehab today.  Preadmission Screen Completed By:  Retta Diones, 11/05/2017 3:16 PM ______________________________________________________________________   Discussed status with Dr. Naaman Plummer on 11/05/17 at 1516 and received telephone approval for admission today.  Admission Coordinator:  Retta Diones, time 1516/Date 11/05/17

## 2017-11-05 NOTE — Progress Notes (Signed)
Subjective:  Ms. Derk was resting in bed comfortably this morning. She denies abdominal pain and had 2 episodes of non-bloody diarrhea. She denies fevers or chills. Tolerating PO intake. She states she does have some family in the area.  Objective:  Vital signs in last 24 hours: Vitals:   11/04/17 0455 11/04/17 1210 11/04/17 2230 11/05/17 0601  BP: (!) 166/92 (!) 149/95 (!) 164/94 (!) 175/89  Pulse: 95 92 (!) 102 97  Resp: 15 16 16 16   Temp: 98.6 F (37 C) 97.7 F (36.5 C) 97.9 F (36.6 C) 97.7 F (36.5 C)  TempSrc: Oral Oral Oral Oral  SpO2: 98% 98% 99% 97%  Weight:    121 lb (54.9 kg)  Height:       GEN: Chronically ill-appearing. Alert and oriented. No acute distress. RESP: Clear to auscultation bilaterally. No wheezes, rales, or rhonchi. No increased work of breathing. CV: Normal rate and regular rhythm. No murmurs, gallops, or rubs. No LE edema. ABD: Soft. Non-tender. Non-distended. Normoactive bowel sounds. EXT: No edema. Warm and well perfused. BACK: Sacral ulcer with clean dry dressing NEURO: Cranial nerves II-XII grossly intact. Able to lift all four extremities against gravity. No apparent audiovisual hallucinations. Speech fluent and appropriate. PSYCH: Patient is calm and pleasant. Appropriate affect. Well-groomed; speech is appropriate and on-subject.  Assessment/Plan:  Principal Problem:   Enteritis due to Clostridium difficile Active Problems:   Urinary tract infection   Pressure injury of skin   Diabetes mellitus type 2 in nonobese (HCC)   Hyperlipidemia   Cognitive impairment   Benign essential HTN   Chronic diastolic congestive heart failure (HCC)   Chronic kidney disease (CKD), stage IV (severe) (HCC)   Acute blood loss anemia   Anemia of chronic disease   Leukocytosis  Ms. Wieczorek is a 57yo female with PMH of T2DM, HFrEF (EF 40%), cognitive delay, and CKD. She was recently admitted for 1 month at [facility] with noncompensated HF,  requiring intermittent dialysis, now bed-bound with neurogenic bladder and chronic indwelling catheter and sacral wound at the time of discharge to SNF. She presented septic with hypothermia, leukocytosis, and persistent hypoglycemia, found to test positive for C. Diff and started on PO vancomycin.  Severe C. Diff infection On day 5 of PO vancomycin. Afebrile and vital signs are stable. Abdominal pain resolved, and diarrhea improving. Tolerating PO intake. She has remained afebrileand hemodynamically stable. BCx negative. -PO vancomycin 125mg  x10 days (stop date 2/6) - Will d/c to CIR  Hypoglycemia, resolved Insulin-dependent T2DM Hx of medication non-compliance. Follows with Endocrinology who had planned to start patient on [place]. Her insulin regimen is unclear. Per SNF documentation, she is receiving Lantus 5u QHS and SSI.CBGs 81-87 this AM and 119 in PM. - Continue Lantus 5 units QHS -SSI-S - CBG monitoring  Deconditioning Sacral ulcer Secondary to recent prolonged hospitalization. Per husband, patient able to ambulate and perform ADLs independently prior to hospitalization. Now unable to ambulate or move lower extremities.She has alsoa pressure injury on her sacrum that extends to her labia. Continuing to work with PT/OT. Approved for CIR. - Continue Gerhardt's butt cream per WOC recs - CIR consult placed  ?CKD Baseline creatinine unknown.Cr 2.4on admission. Per husband, patientdid not have a history of kidney disease prior to [place]. Cr stable at 2.1 -> 2.05 - Strict I/Os -Holding home furosemide - Avoid nephrotoxic medications -Holding homemodafinil (unclear indication)  HFrEF HTN BP 175/89, HR 97 EF 40%.Recently admitted at [facility] for decompensated HF. Attemptingto  obtain records from OSH.Appearseuvolemic on exam.Will continue to monitor volume status. - Strict I/Os + daily weights - Continue home metoprolol50mg  BID - Continue home  Imdur 60mg  qday - Continue home nifedipine 30mg  BID  Bacteriuria Concern for urosepsis on admission with chronic indwelling foley catheter for the past ~2 months and started on ceftriaxone in the ED, however this was discontinued as the etiology of sepsis was thought to be 2/2 severe CDI, not UTI. Catheter was last exchanged on 1/26.  HLD - Continue home ezetimibe 10mg  qday  Dispo: Anticipated discharge today.  Colbert Ewing, MD 11/05/2017, 1:34 PM Pager: Mamie Nick [phone]

## 2017-11-05 NOTE — Progress Notes (Signed)
Orthopedic Tech Progress Note Patient Details:  Laura [name] [birth year] [id number]  Ortho Devices Type of Ortho Device: Prafo boot/shoe Ortho Device/Splint Location: Prafo provided for both left and right feet.  floor nures applied boots to pt.   bilateral       [name] 11/05/2017, 9:36 PM

## 2017-11-06 ENCOUNTER — Inpatient Hospital Stay (HOSPITAL_COMMUNITY): Payer: No Typology Code available for payment source | Admitting: Physical Therapy

## 2017-11-06 ENCOUNTER — Inpatient Hospital Stay (HOSPITAL_COMMUNITY): Payer: No Typology Code available for payment source | Admitting: Speech Pathology

## 2017-11-06 ENCOUNTER — Inpatient Hospital Stay (HOSPITAL_COMMUNITY): Payer: No Typology Code available for payment source | Admitting: Occupational Therapy

## 2017-11-06 LAB — CBC WITH DIFFERENTIAL/PLATELET
Basophils Absolute: 0.1 10*3/uL (ref 0.0–0.1)
Basophils Relative: 0 %
EOS ABS: 0.3 10*3/uL (ref 0.0–0.7)
EOS PCT: 2 %
HCT: 23.4 % — ABNORMAL LOW (ref 36.0–46.0)
Hemoglobin: 7.5 g/dL — ABNORMAL LOW (ref 12.0–15.0)
LYMPHS ABS: 4 10*3/uL (ref 0.7–4.0)
LYMPHS PCT: 33 %
MCH: 27.4 pg (ref 26.0–34.0)
MCHC: 32.1 g/dL (ref 30.0–36.0)
MCV: 85.4 fL (ref 78.0–100.0)
MONOS PCT: 5 %
Monocytes Absolute: 0.6 10*3/uL (ref 0.1–1.0)
Neutro Abs: 7.4 10*3/uL (ref 1.7–7.7)
Neutrophils Relative %: 60 %
PLATELETS: 523 10*3/uL — AB (ref 150–400)
RBC: 2.74 MIL/uL — AB (ref 3.87–5.11)
RDW: 17.1 % — ABNORMAL HIGH (ref 11.5–15.5)
WBC: 12.3 10*3/uL — ABNORMAL HIGH (ref 4.0–10.5)

## 2017-11-06 LAB — COMPREHENSIVE METABOLIC PANEL
ALK PHOS: 85 U/L (ref 38–126)
ALT: 8 U/L — AB (ref 14–54)
ANION GAP: 9 (ref 5–15)
AST: 14 U/L — ABNORMAL LOW (ref 15–41)
Albumin: 1.8 g/dL — ABNORMAL LOW (ref 3.5–5.0)
BUN: 64 mg/dL — ABNORMAL HIGH (ref 6–20)
CALCIUM: 8 mg/dL — AB (ref 8.9–10.3)
CO2: 21 mmol/L — AB (ref 22–32)
CREATININE: 2.26 mg/dL — AB (ref 0.44–1.00)
Chloride: 112 mmol/L — ABNORMAL HIGH (ref 101–111)
GFR, EST AFRICAN AMERICAN: 27 mL/min — AB (ref >60)
GFR, EST NON AFRICAN AMERICAN: 23 mL/min — AB (ref >60)
Glucose, Bld: 95 mg/dL (ref 65–99)
Potassium: 5.1 mmol/L (ref 3.5–5.1)
SODIUM: 142 mmol/L (ref 135–145)
Total Bilirubin: 0.4 mg/dL (ref 0.3–1.2)
Total Protein: 4.8 g/dL — ABNORMAL LOW (ref 6.5–8.1)

## 2017-11-06 LAB — GLUCOSE, CAPILLARY
GLUCOSE-CAPILLARY: 98 mg/dL (ref 65–99)
GLUCOSE-CAPILLARY: 97 mg/dL (ref 65–99)
Glucose-Capillary: 116 mg/dL — ABNORMAL HIGH (ref 65–99)
Glucose-Capillary: 123 mg/dL — ABNORMAL HIGH (ref 65–99)

## 2017-11-06 MED ORDER — METOPROLOL TARTRATE 50 MG PO TABS
100.0000 mg | ORAL_TABLET | Freq: Two times a day (BID) | ORAL | Status: DC
  Administered 2017-11-06 – 2017-12-02 (×52): 100 mg via ORAL
  Filled 2017-11-06 (×52): qty 2

## 2017-11-06 NOTE — Progress Notes (Signed)
Subjective/Complaints: Patient with minimal verbal output.  She does not want to be awakened.  Review of systems unable to obtain  Objective: Vital Signs: Blood pressure (!) 169/85, pulse 95, temperature 98.2 F (36.8 C), temperature source Oral, resp. rate 16, height 4' 11"  (1.499 m), weight 54.9 kg (121 lb 0.5 oz), SpO2 95 %. No results found. Results for orders placed or performed during the hospital encounter of 11/05/17 (from the past 72 hour(s))  Glucose, capillary     Status: Abnormal   Collection Time: 11/05/17  9:37 PM  Result Value Ref Range   Glucose-Capillary 111 (H) 65 - 99 mg/dL   Comment 1 Notify RN   CBC WITH DIFFERENTIAL     Status: Abnormal   Collection Time: 11/06/17  4:41 AM  Result Value Ref Range   WBC 12.3 (H) 4.0 - 10.5 K/uL   RBC 2.74 (L) 3.87 - 5.11 MIL/uL   Hemoglobin 7.5 (L) 12.0 - 15.0 g/dL   HCT 23.4 (L) 36.0 - 46.0 %   MCV 85.4 78.0 - 100.0 fL   MCH 27.4 26.0 - 34.0 pg   MCHC 32.1 30.0 - 36.0 g/dL   RDW 17.1 (H) 11.5 - 15.5 %   Platelets 523 (H) 150 - 400 K/uL   Neutrophils Relative % 60 %   Neutro Abs 7.4 1.7 - 7.7 K/uL   Lymphocytes Relative 33 %   Lymphs Abs 4.0 0.7 - 4.0 K/uL   Monocytes Relative 5 %   Monocytes Absolute 0.6 0.1 - 1.0 K/uL   Eosinophils Relative 2 %   Eosinophils Absolute 0.3 0.0 - 0.7 K/uL   Basophils Relative 0 %   Basophils Absolute 0.1 0.0 - 0.1 K/uL    Comment: Performed at [facility], [address] [place], [place] [address]  Comprehensive metabolic panel     Status: Abnormal   Collection Time: 11/06/17  4:41 AM  Result Value Ref Range   Sodium 142 135 - 145 mmol/L   Potassium 5.1 3.5 - 5.1 mmol/L   Chloride 112 (H) 101 - 111 mmol/L   CO2 21 (L) 22 - 32 mmol/L   Glucose, Bld 95 65 - 99 mg/dL   BUN 64 (H) 6 - 20 mg/dL   Creatinine, Ser 2.26 (H) 0.44 - 1.00 mg/dL   Calcium 8.0 (L) 8.9 - 10.3 mg/dL   Total Protein 4.8 (L) 6.5 - 8.1 g/dL   Albumin 1.8 (L) 3.5 - 5.0 g/dL   AST 14 (L) 15 - 41 U/L   ALT 8 (L) 14 - 54 U/L   Alkaline Phosphatase 85 38 - 126 U/L   Total Bilirubin 0.4 0.3 - 1.2 mg/dL   GFR calc non Af Amer 23 (L) >60 mL/min   GFR calc Af Amer 27 (L) >60 mL/min    Comment: (NOTE) The eGFR has been calculated using the CKD EPI equation. This calculation has not been validated in all clinical situations. eGFR's persistently <60 mL/min signify possible Chronic Kidney Disease.    Anion gap 9 5 - 15    Comment: Performed at [facility] [address] [place], [place] [address]  Glucose, capillary     Status: None   Collection Time: 11/06/17  6:28 AM  Result Value Ref Range   Glucose-Capillary 98 65 - 99 mg/dL   Comment 1 Notify RN      HEENT: normal Cardio: RRR and no murmur Resp: CTA B/L and unlabored GI: BS positive and NT, ND Extremity:  Pulses positive and No Edema Skin:   Intact Neuro: Confused and Abnormal Motor Poor cooperation, antigravity in the upper limbs does move lower limbs but no sustained contraction Musc/Skel:  Other No pain with upper limb or lower limb range of motion General no acute distress   Assessment/Plan: 1. Functional deficits secondary to debility and encephalopathy which require 3+ hours per day of interdisciplinary therapy in a comprehensive inpatient rehab setting. Physiatrist is providing close team supervision and 24 hour management of active medical problems listed below. Physiatrist and rehab team continue to assess barriers to discharge/monitor patient progress toward functional and medical goals. FIM:                                  Medical Problem List and Plan: 1.Functional and mobility deficitssecondary to debility and encephalopathy from multiple medical issues -CIR PT, OT 2. DVT Prophylaxis/Anticoagulation: Pharmaceutical:Lovenox 3. Pain Management:tylenol prn 4. Mood:LCSW to follow for evaluation and support. 5. Neuropsych: This patientis not fullycapable of making  decisions onherown behalf. 6. Skin/Wound Care:Air mattress for pressure relief measures. Local measures to help managesacral /labialMASD -frequent toileting to help with continence. -PRAFO's for bilateral LE's 7. Fluids/Electrolytes/Nutrition:Monitor I/O. Check lytes in am. Continue to offer supplements to help with protein calorie malnutrition. -consider appetite stimulant 8. C diff colitis: Continue Vancomycin154m po q6 hours initiated 1/27 for 10 doses (through 2/5) -continue contact precautions -stools are improving 9. HTN: Monitor BP bid. Poorly controlled at this time. Continue nifedipine bid, Imdur daily and metoprolol bid. Vitals:   11/05/17 2045 11/06/17 0148  BP: (!) 165/91 (!) 169/85  Pulse: (!) 112 95  Resp:  16  Temp:  98.2 F (36.8 C)  SpO2:  972% Elevated systolic, heart rates are increased, will increase metoprolol 10 T2DM with retinopathy, neuropathy, andnephropathy:Monitor BS ac/hs. Continue Lantus 5 units with SSI for elevated BS - CBG (last 3)  Recent Labs    11/05/17 1700 11/05/17 2137 11/06/17 0628  GLUCAP 136* 111* 98  Controlled 2/2 11. H/o depression: Continue Effexor and Remeron.  12. Neurogenic bladder:Has chronic indwelling catheter for 2 months +.Continue foley due to incontinence and for wound healing. 13.AKI onCKD with history of overload: Monitor daily weights for trends, for signs of overload as well as serial check of lytes. 14. Diffuse CAD: treated medically with Metoprolol, Zetia and Plavix.   LOS (Days) 1 A FACE TO FACE EVALUATION WAS PERFORMED  ACharlett Blake2/11/2017, 8:31 AM

## 2017-11-06 NOTE — Evaluation (Signed)
Occupational Therapy Assessment and Plan  Patient Details  Name: Laura [name] MRN: [id number] Date of Birth: 1961-04-18  OT Diagnosis: muscle weakness (generalized) Rehab Potential: Rehab Potential (ACUTE ONLY): Good ELOS: 18-21 days    Today's Date: 11/06/2017 OT Individual Time: 0900-1000 OT Individual Time Calculation (min): 60 min     Problem List:  Patient Active Problem List   Diagnosis Date Noted  . Physical debility 11/05/2017  . Critical illness myopathy   . Encephalopathy   . Diabetes mellitus type 2 in nonobese (HCC)   . Hyperlipidemia   . Cognitive impairment   . Benign essential HTN   . Chronic diastolic congestive heart failure ([place])   . Chronic kidney disease (CKD), stage IV (severe) ([place])   . Acute blood loss anemia   . Anemia of chronic disease   . Leukocytosis   . Enteritis due to Clostridium difficile   . Urinary tract infection 10/30/2017  . Pressure injury of skin 10/30/2017    Past Medical History:  Past Medical History:  Diagnosis Date  . CHF (congestive heart failure) ([place])   . Chronic bronchitis ([place])   . High cholesterol   . Hypertension   . Type II diabetes mellitus ([place])    Past Surgical History:  Past Surgical History:  Procedure Laterality Date  . CESAREAN SECTION    . TUBAL LIGATION      Assessment & Plan Clinical Impression: Patient is a 57 y.o. year old female with recent admission to the hospital on 10/30/17 with weakness and lethary, loose stools and leucocytosis. female(from [place])with T2DM with neuropathy and retinopathy, medical HTN, medical non-compliance,CKDstage IV, [facility] with fluid overload and encephalopathy.She was treated with HD briefly and extensive cardiac, renal and neurologic work up done revealing diffuse CAD, depression, urinary retention due to neurogenic bladder as well as ADEM. She was treated with steroids and discharged to [facility] rehab on  10/07/17. Will likely need HD in the near future per notes. She was admitted to [facility] 10/30/17 and with C diff colitis.   .  Patient transferred to CIR on 11/05/2017 .    Patient currently requires max with basic self-care skills secondary to muscle weakness, decreased safety awareness and decreased memory,  and decreased sitting balance, decreased standing balance, decreased postural control and decreased balance strategies.  Prior to hospitalization, patient was in a SNF. Prior to 08/2017 pt was IND with ADLs. .  Patient will benefit from skilled intervention to decrease level of assist with basic self-care skills prior to discharge home with care partner.  Anticipate patient will require 24 hour supervision and follow up home health.  OT - End of Session Activity Tolerance: Tolerates < 10 min activity with changes in vital signs Endurance Deficit: Yes Endurance Deficit Description: blood pressure noted, and general weakness LE>UE  OT Assessment Rehab Potential (ACUTE ONLY): Good OT Barriers to Discharge: Decreased caregiver support;Medical stability OT Barriers to Discharge Comments: strength OT Patient demonstrates impairments in the following area(s): Balance;Safety;Cognition;Endurance;Motor OT Basic ADL's Functional Problem(s): Eating;Bathing;Dressing;Toileting;Grooming OT Transfers Functional Problem(s): Toilet;Tub/Shower OT Plan OT Intensity: Minimum of 1-2 x/day, 45 to 90 minutes OT Frequency: 5 out of 7 days OT Duration/Estimated Length of Stay: 18-21 days  OT Treatment/Interventions: Balance/vestibular training;Discharge planning;Pain management;Self Care/advanced ADL retraining;Therapeutic Activities;UE/LE Coordination activities;Patient/family education;Functional mobility training;Cognitive remediation/compensation;Disease mangement/prevention;Therapeutic Exercise;UE/LE Strength taining/ROM;DME/adaptive equipment instruction;Community reintegration OT Self Feeding Anticipated  Outcome(s): Mod I OT Basic Self-Care Anticipated Outcome(s): MinA OT Toileting Anticipated Outcome(s): MinA OT Bathroom Transfers Anticipated Outcome(s):  MinA  OT Recommendation Recommendations for Other Services: Speech consult Patient destination: [place] (SNF) Follow Up Recommendations: Skilled nursing facility;Other (comment)(TBD) Equipment Recommended: To be determined   Skilled Therapeutic Intervention Chart review and eval completed. Treatment session focused on ADls/efl care training.Pt supine in bed resting. Vitals checked throughout session per pt report of being dizzy. Pt moved from supine to EOB with mod A and tolerated sitting upright for ~2 minutes. Bl was taken and noted to be 153/78. Pt had dizzy spell and requested to lay down. Showering NOT appropriate at this time d/t dizziness. Pt complete bed bathing. Pt attempted to sit up again EOB with noted dizziness. Repsotioned in bed with mod A and v/c for hand and foot placement. Pt educated on POC and established goals and no c/o pain during session.    OT Evaluation Precautions/Restrictions  Precautions Precautions: Fall Restrictions Weight Bearing Restrictions: No General   Vital Signs Therapy Vitals Temp: 98.2 F (36.8 C) Temp Source: Oral Pulse Rate: (!) 108 Resp: 18 BP: (!) 161/92(RN Notified ) Patient Position (if appropriate): Lying Oxygen Therapy SpO2: 95 % O2 Device: Not Delivered Pain Pain Assessment Pain Assessment: No/denies pain Home Living/Prior Functioning Home Living Family/patient expects to be discharged to:: Skilled nursing facility Available Help at Discharge: Family(husband goes to work around 6pm for 2nd/3rd shift, daughters available at night, she also reports they plan to hire help if needed) Type of Home: House Home Access: Level entry Home Layout: Two level, 1/2 bath on main level Alternate Level Stairs-Number of Steps: 12 steps to 2nd floor, able to stay on first  floor if needed  Bathroom Shower/Tub: Multimedia programmer: Standard Bathroom Accessibility: No Additional Comments: 1/2 bath on first floor; walk in shower upstairs with step in ledge  Lives With: Spouse, Daughter IADL History Homemaking Responsibilities: No Current License: No Prior Function Level of Independence: Independent with basic ADLs, Independent with homemaking with ambulation, Independent with gait, Independent with transfers(PLOF as of Nov 2018, spent time in SNF after first hospital admission prior to most recent hospital admission)  Able to Take Stairs?: Yes Driving: No(does not have drivers license) Vocation: Unemployed Leisure: Hobbies-yes (Comment) Comments: enjoys spending time w/ children and has 1st grandchild on the way ADL ADL Eating: Not assessed Grooming: Minimal assistance, Setup Where Assessed-Grooming: Bed level Upper Body Bathing: Minimal assistance Where Assessed-Upper Body Bathing: Bed level Lower Body Bathing: Moderate assistance Where Assessed-Lower Body Bathing: Bed level Upper Body Dressing: Moderate assistance Where Assessed-Upper Body Dressing: Bed level Lower Body Dressing: Maximal assistance Where Assessed-Lower Body Dressing: Bed level Toileting: Maximal assistance Where Assessed-Toileting: Bedside Commode Toilet Transfer: Unable to assess(due to dizziness and BP ) Toilet Transfer Method: Unable to assess Tub/Shower Transfer: Unable to assess Tub/Shower Transfer Method: Unable to assess Social research officer, government: Unable to assess Social research officer, government Method: Unable to assess ADL Comments: unable to completed transfers at time of eval. pt dizzinessn oted upon upright sitting-=bp monitored throughout session in sitting and HOB elevated  Vision Baseline Vision/History: Wears glasses Wears Glasses: Reading only Patient Visual Report: No change from baseline Vision Assessment?: Yes Additional Comments: Pt reports vision has been  declining in last year or so, increased blurriness. She reports she goes to doctor regularly for "eye injections" to help blurriness, but has not gone since November and therefore blurriness has increased.  Perception    Praxis Praxis: Intact Cognition Overall Cognitive Status: No family/caregiver present to determine baseline cognitive functioning Arousal/Alertness: Awake/alert Orientation Level: Person;Situation;Place Person:  Oriented Place: Oriented Situation: Oriented Year: Other (Comment)(2013) Month: February Day of Week: Incorrect Memory: Impaired Memory Impairment: Decreased short term memory(pt reports short term memory impairments) Decreased Short Term Memory: Verbal complex Attention: Sustained Sustained Attention: Appears intact Awareness: Appears intact Problem Solving: Impaired Executive Function: Self Correcting Self Correcting: Impaired Safety/Judgment: Appears intact Sensation Sensation Light Touch: Appears Intact(denies sensation deficits in LEs) Additional Comments: No differences noted during eval  Coordination Gross Motor Movements are Fluid and Coordinated: No Fine Motor Movements are Fluid and Coordinated: Yes Coordination and Movement Description: impaired coordination of BLEs during functional movements Motor  Motor Motor: Within Functional Limits Motor - Skilled Clinical Observations: generalized weakness Mobility  Bed Mobility Bed Mobility: Rolling Right;Rolling Left;Supine to Sit;Sit to Supine Rolling Right: 4: Min guard Rolling Right Details: Verbal cues for precautions/safety Rolling Left: 4: Min guard Rolling Left Details: Verbal cues for precautions/safety Supine to Sit: 3: Mod assist Supine to Sit Details: Verbal cues for precautions/safety;Verbal cues for sequencing;Manual facilitation for weight shifting;Manual facilitation for weight bearing Supine to Sit Details (indicate cue type and reason): HHA to boost into sitting Sit to Supine:  3: Mod assist Sit to Supine - Details: Verbal cues for precautions/safety;Verbal cues for safe use of DME/AE Transfers Transfers: Not assessed  Trunk/Postural Assessment  Postural Control Postural Control: Deficits on evaluation Righting Reactions: decreased upright posture and strength   Balance Balance Balance Assessed: Yes Static Sitting Balance Static Sitting - Balance Support: Bilateral upper extremity supported Static Sitting - Level of Assistance: 5: Stand by assistance Dynamic Sitting Balance Dynamic Sitting - Balance Support: Feet supported Dynamic Sitting - Level of Assistance: 3: Mod assist Sitting balance - Comments: tolerates upright sitting, monitor blood pressure, pt got dizzy and lost balance during eval during EOB sit with BP noted 153/78.  Extremity/Trunk Assessment RUE Assessment RUE Assessment: Within Functional Limits LUE Assessment LUE Assessment: Within Functional Limits   See Function Navigator for Current Functional Status.   Refer to Care Plan for Long Term Goals  Recommendations for other services: Therapeutic Recreation  Pet therapy and Stress management   Discharge Criteria: Patient will be discharged from OT if patient refuses treatment 3 consecutive times without medical reason, if treatment goals not met, if there is a change in medical status, if patient makes no progress towards goals or if patient is discharged from hospital.  The above assessment, treatment plan, treatment alternatives and goals were discussed and mutually agreed upon: by patient  Delon [place] 11/06/2017, 3:54 PM

## 2017-11-06 NOTE — Evaluation (Signed)
Speech Language Pathology Assessment and Plan  Patient Details  Name: Laura [name] MRN: [id number] Date of Birth: [birth year]  SLP Diagnosis: Cognitive Impairments  Rehab Potential: Good ELOS: 18 to 21 days    Today's Date: 11/06/2017 SLP Individual Time: 1000-1100 SLP Individual Time Calculation (min): 60 min   Problem List:  Patient Active Problem List   Diagnosis Date Noted  . Physical debility 11/05/2017  . Critical illness myopathy   . Encephalopathy   . Diabetes mellitus type 2 in nonobese (HCC)   . Hyperlipidemia   . Cognitive impairment   . Benign essential HTN   . Chronic diastolic congestive heart failure ([place])   . Chronic kidney disease (CKD), stage IV (severe) ([place])   . Acute blood loss anemia   . Anemia of chronic disease   . Leukocytosis   . Enteritis due to Clostridium difficile   . Urinary tract infection 10/30/2017  . Pressure injury of skin 10/30/2017   Past Medical History:  Past Medical History:  Diagnosis Date  . CHF (congestive heart failure) ([place])   . Chronic bronchitis ([place])   . High cholesterol   . Hypertension   . Type II diabetes mellitus ([place])    Past Surgical History:  Past Surgical History:  Procedure Laterality Date  . CESAREAN SECTION    . TUBAL LIGATION      Assessment / Plan / Recommendation Clinical Impression Laura [name] is a 57 year old female (from [place] - residing in [place] since 1990s)with T2DM with neuropathy and retinopathy, medical HTN, medical non-compliance,CKDstage IV, [facility] with fluid overload and encephalopathy.She was treated with HD briefly and extensive cardiac, renal and neurologic work up done revealing diffuse CAD, depression, urinary retention due to neurogenic bladder as well as ADEM. She was treated with steroids and discharged to [facility] rehab on 10/07/17. Will likely need HD in the near future per notes. She was admitted to [facility] 10/30/17  with weakness and lethargy, loose stools and leucocytosis. She was started on IV rocephin due to concerns of urosepsisand diarrhea due to C diff colitis. BC and UCS negative but stools positive for C diff. She was started on oral vancomycin for treatment and has had decrease in loose stools. WOC following input on moisture associated skin damageddue to fecal and urinary incontinence. PO intake poor and multiple supplements added to help with malnutrition. She continues to have diffuse weakness query ciritcal illness myopathy as well as severe debility.   CIR recommended due to functional deficit with admission on 11/05/17. 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 Pt obatined score of 4 out of 22 on MOCA Blind (n=>18). Pt with extremely delayed processing, no recall of new information, decreased working memory, decreased sustained attention and awareness impacting all higher levels of cognitive function. Skilled ST is required to address these cognitive deficits, increase functional independence and reduce caregiver burden. Anticipate that pt will require 24 hour supervision with follow up ST services.    Skilled Therapeutic Interventions          Skilled treatment session focused on completion of SLE, see above. Despite Total A multi-modal cues, pt with no indication of understanding tasks presented. Education provided to nursing on significant deficits present in session.   SLP Assessment  Patient will need skilled [facility] during CIR admission    Recommendations  Patient destination: Home Follow up Recommendations: 24 hour supervision/assistance;Home Health SLP Equipment Recommended: None recommended by SLP  SLP Frequency 3 to 5 out of 7 days   SLP Duration  SLP Intensity  SLP Treatment/Interventions 18 to 21 days  Minumum of 1-2 x/day, 30 to 90 minutes  Cognitive remediation/compensation;Cueing hierarchy;Functional  tasks;Patient/family education;Therapeutic Activities;Internal/external aids    Pain Pain Assessment Pain Assessment: No/denies pain  Prior Functioning Cognitive/Linguistic Baseline: Within functional limits Type of Home: House  Lives With: Spouse;Daughter Available Help at Discharge: Family Vocation: Unemployed  Function:    Cognition Comprehension Comprehension assist level: Understands basic less than 25% of the time/ requires cueing >75% of the time  Expression   Expression assist level: Expresses basis less than 25% of the time/requires cueing >75% of the time.  Social Interaction Social Interaction assist level: Interacts appropriately less than 25% of the time. May be withdrawn or combative.  Problem Solving Problem solving assist level: Solves basic less than 25% of the time - needs direction nearly all the time or does not effectively solve problems and may need a restraint for safety  Memory Memory assist level: Recognizes or recalls less than 25% of the time/requires cueing greater than 75% of the time   Short Term Goals: Week 1: SLP Short Term Goal 1 (Week 1): Pt will utilize external memory aides to recall new dialy information with Mod A cues.  SLP Short Term Goal 2 (Week 1): Pt will complete basic familiar problem solving task related to ADL with Mod A cues.  SLP Short Term Goal 3 (Week 1): Pt will initiate and respond to task in timely manner with Mod A cues.  SLP Short Term Goal 4 (Week 1): Pt will sustain attention to basic familiar task for ~ 10 minutes with Mod A cues.   Refer to Care Plan for Long Term Goals  Recommendations for other services: None   Discharge Criteria: Patient will be discharged from SLP if patient refuses treatment 3 consecutive times without medical reason, if treatment goals not met, if there is a change in medical status, if patient makes no progress towards goals or if patient is discharged from hospital.  The above assessment,  treatment plan, treatment alternatives and goals were discussed and mutually agreed upon: by patient  Laura [name] 11/06/2017, 4:56 PM

## 2017-11-06 NOTE — Plan of Care (Signed)
LT goals established 11/06/17

## 2017-11-06 NOTE — Evaluation (Addendum)
Physical Therapy Assessment and Plan  Patient Details  Name: Laura [name] MRN: [id number] Date of Birth: [birth year]  PT Diagnosis: Cognitive deficits, Impaired cognition and Muscle weakness Rehab Potential: Fair ELOS: 18-20 days   Today's Date: 11/06/2017 PT Individual Time: 1300-1410 PT Individual Time Calculation (min): 70 min    Problem List:  Patient Active Problem List   Diagnosis Date Noted  . Physical debility 11/05/2017  . Critical illness myopathy   . Encephalopathy   . Diabetes mellitus type 2 in nonobese (HCC)   . Hyperlipidemia   . Cognitive impairment   . Benign essential HTN   . Chronic diastolic congestive heart failure ([place])   . Chronic kidney disease (CKD), stage IV (severe) ([place])   . Acute blood loss anemia   . Anemia of chronic disease   . Leukocytosis   . Enteritis due to Clostridium difficile   . Urinary tract infection 10/30/2017  . Pressure injury of skin 10/30/2017    Past Medical History:  Past Medical History:  Diagnosis Date  . CHF (congestive heart failure) ([place])   . Chronic bronchitis ([place])   . High cholesterol   . Hypertension   . Type II diabetes mellitus ([place])    Past Surgical History:  Past Surgical History:  Procedure Laterality Date  . CESAREAN SECTION    . TUBAL LIGATION      Assessment & Plan Clinical Impression: Patient is a 57 year old female(from [place])with T2DM with neuropathy and retinopathy, medical HTN, medical non-compliance,CKDstage IV, [facility] syndrome with fluid overload and encephalopathy.She was treated with HD briefly and extensive cardiac, renal and neurologic work up done revealing diffuse CAD, depression, urinary retention due to neurogenic bladder as well as ADEM. She was treated with steroids and discharged to [facility] rehab on 10/07/17. Will likely need HD in the near future per notes. She was admitted to [facility] 10/30/17 with weakness and lethargy, loose  stools and leucocytosis. She was started on IV rocephin due to concerns of urosepsis but and diarrhea due to C diff colitis. BC and UCS negative but stools positive for C diff. She was started on oral vancomycin for treatment and has had decrease in loose stools. WOC following input on moisture associated skin damageddue to fecal and urinary incontinence. Po intake poor and multiple supplements added to help with malnutrition. She continues to have diffuse weakness query ciritcal illness myopathy as well as sever debility. Patient transferred to CIR on 11/05/2017 .   Patient currently requires max with mobility secondary to muscle weakness and muscle paralysis, decreased cardiorespiratoy endurance, unbalanced muscle activation and decreased motor planning and decreased sitting balance, decreased standing balance, decreased postural control and decreased balance strategies.  Prior to hospitalization, patient was independent  with mobility and lived with Spouse, Daughter in a   home.  Home access is   .  Patient will benefit from skilled PT intervention to maximize safe functional mobility, minimize fall risk and decrease caregiver burden for planned discharge home with 24 hour assist.  Anticipate patient will benefit from follow up [facility] at discharge.  PT - End of Session Endurance Deficit: Yes Endurance Deficit Description: blood pressure noted, and general weakness LE>UE   Skilled Therapeutic Intervention  Pt supine and agreeable to therapy, no c/o pain. Pt instructed patient in PT Evaluation and initiated treatment intervention; see below for results. Made multiple attempts at sit<>stand transfer in stedy, however unable w/ max-total assist x1. Returned to room in w/c and transferred back  to EOB w/ max assist, pt c/o dizziness at this time and demonstrated increased shakiness. BP 163/88 in supine, placed in trendelenburg as pt was continuing to c/o dizziness after multiple minutes in supine. RN made  aware, RN reports BP is pt's normal. Returned to neutral supine, pt continued to c/o dizziness and color in face noticed to have become more pale however pt remained responsive and conversing w/ PT, RN made aware. Pt educated patient in [place], rehab potential, rehab goals, and discharge recommendations. Ended session in supine, call bell within reach and all needs met.   PT Evaluation Precautions/Restrictions Precautions Precautions: Fall Precaution Comments: monitor blood pressure Restrictions Weight Bearing Restrictions: No General   Vital SignsTherapy Vitals BP: (!) 153/78 Patient Position (if appropriate): Sitting Pain Pain Assessment Pain Assessment: No/denies pain Home Living/Prior Functioning Home Living Available Help at Discharge: [place]: Two level;1/2 bath on main level Bathroom Shower/Tub: Multimedia programmer: Standard Bathroom Accessibility: No Additional Comments: 1/2 bath on first floor; walk in shower upstairs with step in ledge  Lives With: Spouse;Daughter Prior Function Level of Independence: Independent with basic ADLs  Able to Take Stairs?: Yes Driving: No Vocation: Unemployed Vision/Perception  Perception Perception: Impaired Praxis Praxis: Intact  Cognition Overall Cognitive Status: No family/caregiver present to determine baseline cognitive functioning Arousal/Alertness: Awake/alert Memory: Impaired Memory Impairment: Decreased short term memory Decreased Short Term Memory: Verbal complex Awareness: Appears intact Problem Solving: Impaired Executive Function: Self Correcting Self Correcting: Impaired Safety/Judgment: Impaired Sensation Sensation Light Touch: Appears Intact Additional Comments: No differences noted during eval  Coordination Gross Motor Movements are Fluid and Coordinated: No Fine Motor Movements are Fluid and Coordinated: Yes Coordination and Movement Description: decreased  coorindation/mvt of LE  Motor  Motor Motor: Within Functional Limits Motor - Skilled Clinical Observations: significant weakness fo B LE   Mobility Bed Mobility Bed Mobility: Rolling Right;Rolling Left;Supine to Sit;Sit to Supine Rolling Right: 4: Min assist Rolling Right Details: Verbal cues for precautions/safety Rolling Left: 4: Min assist Rolling Left Details: Verbal cues for precautions/safety Supine to Sit: 3: Mod assist Supine to Sit Details: Verbal cues for precautions/safety;Verbal cues for sequencing Sit to Supine: 3: Mod assist Sit to Supine - Details: Verbal cues for precautions/safety;Verbal cues for safe use of DME/AE Locomotion  Ambulation Ambulation: No Gait Gait: No Stairs / Additional Locomotion Stairs: No Wheelchair Mobility Wheelchair Mobility: Yes Wheelchair Assistance: 4: Advertising account executive Details: Verbal cues for technique;Verbal cues for safe use of DME/AE;Manual facilitation for placement;Manual facilitation for weight bearing Wheelchair Propulsion: Both upper extremities Wheelchair Parts Management: Needs assistance Distance: 100'  Trunk/Postural Assessment  Postural Control Postural Control: Deficits on evaluation(delayed righting reactions)  Balance Balance Balance Assessed: Yes Static Sitting Balance Static Sitting - Balance Support: Bilateral upper extremity supported;Feet supported Static Sitting - Level of Assistance: 5: Stand by assistance Dynamic Sitting Balance Dynamic Sitting - Balance Support: Feet supported Dynamic Sitting - Level of Assistance: 3: Mod assist Extremity Assessment  RUE Assessment RUE Assessment: Within Functional Limits LUE Assessment LUE Assessment: Within Functional Limits RLE Assessment RLE Assessment: Exceptions to WFL(2/5 hip ms, 1/5 knee ms, 0/5 ankle ms) LLE Assessment LLE Assessment: Exceptions to WFL(2/5 hip ms, 1/5 knee ms, 0/5 ankle ms)   See Function Navigator for Current Functional  Status.   Refer to Care Plan for Long Term Goals  Recommendations for other services: None   Discharge Criteria: Patient will be discharged from PT if patient refuses treatment 3 consecutive times without medical  reason, if treatment goals not met, if there is a change in medical status, if patient makes no progress towards goals or if patient is discharged from hospital.  The above assessment, treatment plan, treatment alternatives and goals were discussed and mutually agreed upon: by patient  Navika Hoopes K Arnette 11/06/2017, 12:56 PM

## 2017-11-07 ENCOUNTER — Inpatient Hospital Stay (HOSPITAL_COMMUNITY): Payer: No Typology Code available for payment source | Admitting: *Deleted

## 2017-11-07 DIAGNOSIS — D500 Iron deficiency anemia secondary to blood loss (chronic): Secondary | ICD-10-CM

## 2017-11-07 LAB — GLUCOSE, CAPILLARY
GLUCOSE-CAPILLARY: 118 mg/dL — AB (ref 65–99)
GLUCOSE-CAPILLARY: 121 mg/dL — AB (ref 65–99)
GLUCOSE-CAPILLARY: 158 mg/dL — AB (ref 65–99)
Glucose-Capillary: 114 mg/dL — ABNORMAL HIGH (ref 65–99)

## 2017-11-07 LAB — HEMOGLOBIN AND HEMATOCRIT, BLOOD
HCT: 23.8 % — ABNORMAL LOW (ref 36.0–46.0)
HEMOGLOBIN: 7.7 g/dL — AB (ref 12.0–15.0)

## 2017-11-07 LAB — PREPARE RBC (CROSSMATCH)

## 2017-11-07 LAB — ABO/RH: ABO/RH(D): AB POS

## 2017-11-07 MED ORDER — BLISTEX MEDICATED EX OINT
TOPICAL_OINTMENT | CUTANEOUS | Status: DC | PRN
  Filled 2017-11-07: qty 6.3

## 2017-11-07 MED ORDER — ACETAMINOPHEN 325 MG PO TABS
650.0000 mg | ORAL_TABLET | Freq: Once | ORAL | Status: AC
  Administered 2017-11-07: 650 mg via ORAL
  Filled 2017-11-07: qty 2

## 2017-11-07 MED ORDER — SODIUM CHLORIDE 0.9 % IV SOLN
Freq: Once | INTRAVENOUS | Status: AC
  Administered 2017-11-07: 13:00:00 via INTRAVENOUS

## 2017-11-07 NOTE — Progress Notes (Signed)
Physical Therapy Session Note  Patient Details  Name: Laura [name] MRN: [id number] Date of Birth: Jun 15, 1961  Today's Date: 11/07/2017 PT Individual Time: 0905-1005 PT Individual Time Calculation (min): 60 min   Short Term Goals: Week 1:  PT Short Term Goal 1 (Week 1): Pt will perform sit<>stand transfer using LRAD, Max assist x1 PT Short Term Goal 2 (Week 1): Pt will tolerate sitting up in w/c in between therapies for 1 hour w/o increase in fatigue PT Short Term Goal 3 (Week 1): Pt will maintain dynamic sitting balance w/ min assist  Skilled Therapeutic Interventions/Progress Updates:  Pt resting in bed, fatigued by feeling OK. Noted labs =- Hgb 7.4, trending down. Modified tx for safety. Continue to monitor.  Supine BP 159/76  HR 97bpm  02 95% on RA   Supien therex with AAROM: ankle pumps, glute sets, heel slides, hip ADD stabilization, shoulder raises bil x10   Seated BP 149/72 HR 96 02 95% on RA  Seated balance x5 min with Min A >>S for upright trunk posture. Heavy UE reliance.    sit<>partial stand x2 total A, no weigh accepted through bil LEs. Squat-pivot transfer total; A bed>WC Sit<>stand at [facility] for cleaning total A. Attempted Stedy (not Bariatric), but it was too large for pt's short thigh length, so returned to [facility].  Squat-pivot WC>recliner Total A. Discussed various transfer methods with NT. May need to consider sliding board with step stool.   Pt left in [facility] recliner for increased activity tolerance today with Roho and chair alarm. All needs in reach and VSS.    Therapy Documentation Precautions:  Precautions Precautions: Fall Precaution Comments: monitor blood pressure Restrictions Weight Bearing Restrictions: No General:   Vital Signs: Therapy Vitals Temp: 99.1 F (37.3 C) Temp Source: Axillary Pulse Rate: 91 Resp: 20 BP: 137/70 Patient Position (if appropriate): Lying Oxygen Therapy SpO2: 93 % O2 Device: Not Delivered Pain:   Mobility:   Locomotion  :    Trunk/Postural Assessment :    Balance:   Exercises:   Other Treatments:     See Function Navigator for Current Functional Status.   Therapy/Group: Individual Therapy  Soundra Pilon 11/07/2017, 9:19 AM

## 2017-11-07 NOTE — Progress Notes (Signed)
Subjective/Complaints: Awake this am oriented to self and hsopital stated she was tired during therapy PT noted poor endurance  Review of systems unable to obtain  Objective: Vital Signs: Blood pressure 137/70, pulse 91, temperature 99.1 F (37.3 C), temperature source Axillary, resp. rate 20, height 4' 11"  (1.499 m), weight 54.9 kg (121 lb 0.5 oz), SpO2 93 %. No results found. Results for orders placed or performed during the hospital encounter of 11/05/17 (from the past 72 hour(s))  Glucose, capillary     Status: Abnormal   Collection Time: 11/05/17  9:37 PM  Result Value Ref Range   Glucose-Capillary 111 (H) 65 - 99 mg/dL   Comment 1 Notify RN   CBC WITH DIFFERENTIAL     Status: Abnormal   Collection Time: 11/06/17  4:41 AM  Result Value Ref Range   WBC 12.3 (H) 4.0 - 10.5 K/uL   RBC 2.74 (L) 3.87 - 5.11 MIL/uL   Hemoglobin 7.5 (L) 12.0 - 15.0 g/dL   HCT 23.4 (L) 36.0 - 46.0 %   MCV 85.4 78.0 - 100.0 fL   MCH 27.4 26.0 - 34.0 pg   MCHC 32.1 30.0 - 36.0 g/dL   RDW 17.1 (H) 11.5 - 15.5 %   Platelets 523 (H) 150 - 400 K/uL   Neutrophils Relative % 60 %   Neutro Abs 7.4 1.7 - 7.7 K/uL   Lymphocytes Relative 33 %   Lymphs Abs 4.0 0.7 - 4.0 K/uL   Monocytes Relative 5 %   Monocytes Absolute 0.6 0.1 - 1.0 K/uL   Eosinophils Relative 2 %   Eosinophils Absolute 0.3 0.0 - 0.7 K/uL   Basophils Relative 0 %   Basophils Absolute 0.1 0.0 - 0.1 K/uL    Comment: Performed at [facility], [address], [place], [place] [address]  Comprehensive metabolic panel     Status: Abnormal   Collection Time: 11/06/17  4:41 AM  Result Value Ref Range   Sodium 142 135 - 145 mmol/L   Potassium 5.1 3.5 - 5.1 mmol/L   Chloride 112 (H) 101 - 111 mmol/L   CO2 21 (L) 22 - 32 mmol/L   Glucose, Bld 95 65 - 99 mg/dL   BUN 64 (H) 6 - 20 mg/dL   Creatinine, Ser 2.26 (H) 0.44 - 1.00 mg/dL   Calcium 8.0 (L) 8.9 - 10.3 mg/dL   Total Protein 4.8 (L) 6.5 - 8.1 g/dL   Albumin 1.8 (L) 3.5 - 5.0  g/dL   AST 14 (L) 15 - 41 U/L   ALT 8 (L) 14 - 54 U/L   Alkaline Phosphatase 85 38 - 126 U/L   Total Bilirubin 0.4 0.3 - 1.2 mg/dL   GFR calc non Af Amer 23 (L) >60 mL/min   GFR calc Af Amer 27 (L) >60 mL/min    Comment: (NOTE) The eGFR has been calculated using the CKD EPI equation. This calculation has not been validated in all clinical situations. eGFR's persistently <60 mL/min signify possible Chronic Kidney Disease.    Anion gap 9 5 - 15    Comment: Performed at [facility] [address], [place], [place] [address]  Glucose, capillary     Status: None   Collection Time: 11/06/17  6:28 AM  Result Value Ref Range   Glucose-Capillary 98 65 - 99 mg/dL   Comment 1 Notify RN   Glucose, capillary     Status: None   Collection Time: 11/06/17 11:38 AM  Result Value  Ref Range   Glucose-Capillary 97 65 - 99 mg/dL  Glucose, capillary     Status: Abnormal   Collection Time: 11/06/17  4:37 PM  Result Value Ref Range   Glucose-Capillary 116 (H) 65 - 99 mg/dL  Glucose, capillary     Status: Abnormal   Collection Time: 11/06/17  9:23 PM  Result Value Ref Range   Glucose-Capillary 123 (H) 65 - 99 mg/dL  Glucose, capillary     Status: Abnormal   Collection Time: 11/07/17  7:06 AM  Result Value Ref Range   Glucose-Capillary 118 (H) 65 - 99 mg/dL   Comment 1 Notify RN      HEENT: normal Cardio: RRR and no murmur Resp: CTA B/L and unlabored GI: BS positive and NT, ND Extremity:  Pulses positive and No Edema Skin:   Intact Neuro: Confused and Abnormal Motor Poor cooperation, antigravity in the upper limbs does move lower limbs but no sustained contraction Musc/Skel:  Other No pain with upper limb or lower limb range of motion General no acute distress   Assessment/Plan: 1. Functional deficits secondary to debility and encephalopathy which require 3+ hours per day of interdisciplinary therapy in a comprehensive inpatient rehab setting. Physiatrist is providing close team  supervision and 24 hour management of active medical problems listed below. Physiatrist and rehab team continue to assess barriers to discharge/monitor patient progress toward functional and medical goals. FIM: Function - Bathing Position: Bed Body parts bathed by patient: Right arm, Left arm, Chest, Abdomen, Front perineal area, Right upper leg, Left upper leg Body parts bathed by helper: Buttocks, Right lower leg, Left lower leg Assist Level: Touching or steadying assistance(Pt > 75%)  Function- Upper Body Dressing/Undressing What is the patient wearing?: Hospital gown Assist Level: Touching or steadying assistance(Pt > 75%) Function - Lower Body Dressing/Undressing What is the patient wearing?: Underwear, Hospital Gown Position: Bed Underwear - Performed by helper: Thread/unthread right underwear leg, Thread/unthread left underwear leg Assist for footwear: Partial/moderate assist Assist for lower body dressing: Touching or steadying assistance (Pt > 75%)        Function - Chair/bed transfer Chair/bed transfer method: Squat pivot Chair/bed transfer assist level: Maximal assist (Pt 25 - 49%/lift and lower) Chair/bed transfer assistive device: Armrests, Bedrails Chair/bed transfer details: Manual facilitation for weight bearing, Manual facilitation for weight shifting, Manual facilitation for placement, Verbal cues for technique, Tactile cues for initiation, Tactile cues for placement  Function - Locomotion: Wheelchair Will patient use wheelchair at discharge?: Yes Type: Manual Max wheelchair distance: 100' Assist Level: Touching or steadying assistance (Pt > 75%) Assist Level: Touching or steadying assistance (Pt > 75%) Wheel 150 feet activity did not occur: Safety/medical concerns Turns around,maneuvers to table,bed, and toilet,negotiates 3% grade,maneuvers on rugs and over doorsills: No Function - Locomotion: Ambulation Ambulation activity did not occur: Safety/medical  concerns Walk 10 feet activity did not occur: Safety/medical concerns Walk 50 feet with 2 turns activity did not occur: Safety/medical concerns Walk 150 feet activity did not occur: Safety/medical concerns Walk 10 feet on uneven surfaces activity did not occur: Safety/medical concerns  Function - Comprehension Comprehension: Auditory Comprehension assist level: Understands basic less than 25% of the time/ requires cueing >75% of the time  Function - Expression Expression: Verbal Expression assist level: Expresses basis less than 25% of the time/requires cueing >75% of the time.  Function - Social Interaction Social Interaction assist level: Interacts appropriately less than 25% of the time. May be withdrawn or combative.  Function - Problem Solving  Problem solving assist level: Solves basic less than 25% of the time - needs direction nearly all the time or does not effectively solve problems and may need a restraint for safety  Function - Memory Memory assist level: Recognizes or recalls less than 25% of the time/requires cueing greater than 75% of the time Patient normally able to recall (first 3 days only): That he or she is in a hospital, Current season  Medical Problem List and Plan: 1.Functional and mobility deficitssecondary to debility and encephalopathy from multiple medical issues -CIR PT, OT 2. DVT Prophylaxis/Anticoagulation: Pharmaceutical:Lovenox 3. Pain Management:tylenol prn 4. Mood:LCSW to follow for evaluation and support. 5. Neuropsych: This patientis not fullycapable of making decisions onherown behalf. 6. Skin/Wound Care:Air mattress for pressure relief measures. Local measures to help managesacral /labialMASD -2 cm area of fibrinous granulation tissue buttocks, santyl + air mattress -PRAFO's for bilateral LE's 7. Fluids/Electrolytes/Nutrition:Monitor I/O. Check lytes in am. Continue to offer supplements to help  with protein calorie malnutrition. -consider appetite stimulant 8. C diff colitis: Continue Vancomycin152m po q6 hours initiated 1/27 for 10 doses (through 2/5) -continue contact precautions -stools are improving 9. HTN: Monitor BP bid. Poorly controlled at this time. Continue nifedipine bid, Imdur daily and metoprolol bid. Vitals:   11/06/17 2042 11/07/17 0611  BP: (!) 161/85 137/70  Pulse: (!) 119 91  Resp:  20  Temp:  99.1 F (37.3 C)  SpO2:  906% Elevated systolic, heart rates are increased, will increase metoprolol 10 T2DM with retinopathy, neuropathy, andnephropathy:Monitor BS ac/hs. Continue Lantus 5 units with SSI for elevated BS - CBG (last 3)  Recent Labs    11/06/17 1637 11/06/17 2123 11/07/17 0706  GLUCAP 116* 123* 118*  Controlled 2/2 11. H/o depression: Continue Effexor and Remeron.  12. Neurogenic bladder:Has chronic indwelling catheter for 2 months +.Continue foley due to incontinence and for wound healing.last UA showed no blood 13.AKI onCKD with history of overload: Monitor daily weights for trends, for signs of overload as well as serial check of lytes. 14. Diffuse CAD: treated medically with Metoprolol, Zetia and Plavix. 15.  Symptomatic anemia, poor PT tolerance dizziness, fatigue, Hgb 7.4 has been trending down.   Check stool OB, may have GI blood loss from C diff combined with anticog Check post transfusion H and H  LOS (Days) 2 A FACE TO FACE EVALUATION WAS PERFORMED  ACharlett Blake2/12/2017, 9:12 AM

## 2017-11-08 ENCOUNTER — Inpatient Hospital Stay (HOSPITAL_COMMUNITY): Payer: No Typology Code available for payment source | Admitting: Physical Therapy

## 2017-11-08 ENCOUNTER — Inpatient Hospital Stay (HOSPITAL_COMMUNITY): Payer: No Typology Code available for payment source | Admitting: Speech Pathology

## 2017-11-08 ENCOUNTER — Inpatient Hospital Stay (HOSPITAL_COMMUNITY): Payer: No Typology Code available for payment source | Admitting: Occupational Therapy

## 2017-11-08 DIAGNOSIS — E46 Unspecified protein-calorie malnutrition: Secondary | ICD-10-CM

## 2017-11-08 DIAGNOSIS — A0472 Enterocolitis due to Clostridium difficile, not specified as recurrent: Secondary | ICD-10-CM

## 2017-11-08 DIAGNOSIS — E118 Type 2 diabetes mellitus with unspecified complications: Secondary | ICD-10-CM

## 2017-11-08 DIAGNOSIS — I10 Essential (primary) hypertension: Secondary | ICD-10-CM

## 2017-11-08 DIAGNOSIS — Z794 Long term (current) use of insulin: Secondary | ICD-10-CM

## 2017-11-08 DIAGNOSIS — N179 Acute kidney failure, unspecified: Secondary | ICD-10-CM | POA: Insufficient documentation

## 2017-11-08 DIAGNOSIS — N183 Chronic kidney disease, stage 3 unspecified: Secondary | ICD-10-CM | POA: Insufficient documentation

## 2017-11-08 DIAGNOSIS — D638 Anemia in other chronic diseases classified elsewhere: Secondary | ICD-10-CM

## 2017-11-08 DIAGNOSIS — D62 Acute posthemorrhagic anemia: Secondary | ICD-10-CM

## 2017-11-08 DIAGNOSIS — G9340 Encephalopathy, unspecified: Secondary | ICD-10-CM

## 2017-11-08 DIAGNOSIS — E8809 Other disorders of plasma-protein metabolism, not elsewhere classified: Secondary | ICD-10-CM

## 2017-11-08 DIAGNOSIS — R5381 Other malaise: Principal | ICD-10-CM

## 2017-11-08 DIAGNOSIS — D72829 Elevated white blood cell count, unspecified: Secondary | ICD-10-CM

## 2017-11-08 DIAGNOSIS — N319 Neuromuscular dysfunction of bladder, unspecified: Secondary | ICD-10-CM

## 2017-11-08 LAB — TYPE AND SCREEN
ABO/RH(D): AB POS
Antibody Screen: NEGATIVE
Unit division: 0

## 2017-11-08 LAB — GLUCOSE, CAPILLARY
GLUCOSE-CAPILLARY: 204 mg/dL — AB (ref 65–99)
GLUCOSE-CAPILLARY: 40 mg/dL — AB (ref 65–99)
GLUCOSE-CAPILLARY: 44 mg/dL — AB (ref 65–99)
Glucose-Capillary: 150 mg/dL — ABNORMAL HIGH (ref 65–99)
Glucose-Capillary: 77 mg/dL (ref 65–99)
Glucose-Capillary: 141 mg/dL — ABNORMAL HIGH (ref 65–99)

## 2017-11-08 LAB — BPAM RBC
Blood Product Expiration Date: 201902152359
ISSUE DATE / TIME: 201902031258
Unit Type and Rh: 6200

## 2017-11-08 LAB — OCCULT BLOOD X 1 CARD TO LAB, STOOL: FECAL OCCULT BLD: POSITIVE — AB

## 2017-11-08 MED ORDER — ISOSORBIDE MONONITRATE ER 30 MG PO TB24: 30.0000 mg | ORAL_TABLET | Freq: Every day | ORAL | Status: DC

## 2017-11-08 MED ORDER — ENOXAPARIN SODIUM 30 MG/0.3ML ~~LOC~~ SOLN
30.0000 mg | SUBCUTANEOUS | Status: DC
  Administered 2017-11-09 – 2017-12-01 (×22): 30 mg via SUBCUTANEOUS
  Filled 2017-11-08 (×22): qty 0.3

## 2017-11-08 MED ORDER — COLLAGENASE 250 UNIT/GM EX OINT
TOPICAL_OINTMENT | Freq: Every day | CUTANEOUS | Status: DC
  Administered 2017-11-08 – 2017-11-22 (×14): via TOPICAL
  Filled 2017-11-08: qty 30

## 2017-11-08 MED ORDER — ISOSORBIDE MONONITRATE ER 30 MG PO TB24
90.0000 mg | ORAL_TABLET | Freq: Every day | ORAL | Status: DC
  Administered 2017-11-08 – 2017-12-02 (×25): 90 mg via ORAL
  Filled 2017-11-08 (×24): qty 3

## 2017-11-08 MED ORDER — ISOSORBIDE MONONITRATE ER 30 MG PO TB24: 90.0000 mg | ORAL_TABLET | Freq: Every day | ORAL | Status: DC

## 2017-11-08 MED ORDER — GLUCOSE 40 % PO GEL
ORAL | Status: AC
  Administered 2017-11-08: 37.5 g
  Filled 2017-11-08: qty 1

## 2017-11-08 NOTE — Consult Note (Signed)
[place] Nurse wound consult note Reason for Consult: Consult requested to re-assess sacrum wound.  Refer to previous [place] consult performed on 1/27. Pt is frequently incontinent of stool and it is difficult to keep the wound from becoming soiled.  She is very thin with protruding sacrum bone.  Wound type: Darker brown, discolored skin surrounding sacrum/buttocks to area 6X9cm Sacrum with unstageable pressure injury 1.5X.8cm, 90% yellow slough, 10% red, small amt tan drainage, no odor Pressure Injury POA: Yes Dressing procedure/placement/frequency: Santyl ointment to provide enzymatic debridement of nonviable tissue.  Pt is already on an air mattress to reduce pressure.  Tegaderm to cover the dressing and attempt to keep the stool out of the wound.  It would be beneficial to promote healing if foley can remain in place. Discussed plan of care with patient and she verbalized understanding. Please re-consult if further assistance is needed.  Thank-you,  Julien Girt MSN, [place], [place], [place], [place]

## 2017-11-08 NOTE — Progress Notes (Signed)
Physical Therapy Session Note  Patient Details  Name: Laura [name] MRN: [id number] Date of Birth: 01/30/1961  Today's Date: 11/08/2017 PT Individual Time: 0800-0900 PT Individual Time Calculation (min): 60 min   Short Term Goals: Week 1:  PT Short Term Goal 1 (Week 1): Pt will perform sit<>stand transfer using LRAD, Max assist x1 PT Short Term Goal 2 (Week 1): Pt will tolerate sitting up in w/c in between therapies for 1 hour w/o increase in fatigue PT Short Term Goal 3 (Week 1): Pt will maintain dynamic sitting balance w/ min assist  Skilled Therapeutic Interventions/Progress Updates:    Pt was greeted sleeping supine in bed, agreeable to therapy, requests to eat breakfast, affect flat and passive throughout session. Pt transitions supine to sit with modA for trunk management, sits EOB to eat breakfast with set-up assistance, demonstrates decreased sitting balance with 1 LOB to L elbow. Pt dons bra with set-up assistance, catches bra strap on L hand IV and it pulls out, notified nurse immediately to remove it and apply dressing, increased trunk sway noted with supported sitting >63mins. Attempted sit<>stand from EOB x 2 to don brief and pajama pants, pt demonstrates posterior lean, minimal acceptance of weight through BLE with vcing, heavily relies on UE support on bedrails to maintain upright. Pt transitions sit to supine with total A, scoots to [facility] by bridging however unable to fully clear hips from bed. Total A +1 to perform hygiene d/t bm and to don briefs/pants in supine, rolls R/L with modA. Performs lateral scoot transfer bed >recliner using beasy board (no sliding board available), positioned on roho cushion for pressure management, chair alarm and QRB in place, denies any needs at this time.   Therapy Documentation Precautions:  Precautions Precautions: Fall Precaution Comments: monitor blood pressure Restrictions Weight Bearing Restrictions: No  See Function Navigator for Current  Functional Status.   Therapy/Group: Individual Therapy  Salvatore Decent 11/08/2017, 12:06 PM

## 2017-11-08 NOTE — Progress Notes (Signed)
Patient information reviewed and entered into eRehab system by Carlis Blanchard, RN, CRRN, PPS Coordinator.  Information including medical coding and functional independence measure will be reviewed and updated through discharge.    

## 2017-11-08 NOTE — Care Management (Signed)
[facility] Individual Statement of Services  Patient Name:  Laura [name]  Date:  11/08/2017  Welcome to the [facility].  Our goal is to provide you with an individualized program based on your diagnosis and situation, designed to meet your specific needs.  With this comprehensive rehabilitation program, you will be expected to participate in at least 3 hours of rehabilitation therapies Monday-Friday, with modified therapy programming on the weekends.  Your rehabilitation program will include the following services:  Physical Therapy (PT), Occupational Therapy (OT), Speech Therapy (ST), 24 hour per day rehabilitation nursing, Therapeutic Recreaction (TR), Neuropsychology, Case Management (Social Worker), Rehabilitation Medicine, Nutrition Services and Pharmacy Services  Weekly team conferences will be held on Wednesdays to discuss your progress.  Your Social Worker will talk with you frequently to get your input and to update you on team discussions.  Team conferences with you and your family in attendance may also be held.  Expected length of stay: 2-3 weeks    Overall anticipated outcome: minimal assistance at wheelchair level  Depending on your progress and recovery, your program may change. Your Social Worker will coordinate services and will keep you informed of any changes. Your Social Worker's name and contact numbers are listed  below.  The following services may also be recommended but are not provided by the [facility] will be made to provide these services after discharge if needed.  Arrangements include referral to agencies that provide these services.  Your insurance has been verified to be:  [employer] with [place] primary doctor is:  None  Pertinent information will be shared  with your doctor and your insurance company.  Social Worker:  [place], [place] or [phone]   Information discussed with and copy given to patient by: Lennart Pall, 11/08/2017, 3:29 PM

## 2017-11-08 NOTE — Progress Notes (Signed)
Speech Language Pathology Daily Session Note  Patient Details  Name: Laura [name] MRN: [id number] Date of Birth: Sep 12, 1961  Today's Date: 11/08/2017 SLP Individual Time: 7672-0947 SLP Individual Time Calculation (min): 30 min  Short Term Goals: Week 1: SLP Short Term Goal 1 (Week 1): Pt will utilize external memory aides to recall new dialy information with Mod A cues.  SLP Short Term Goal 2 (Week 1): Pt will complete basic familiar problem solving task related to ADL with Mod A cues.  SLP Short Term Goal 3 (Week 1): Pt will initiate and respond to task in timely manner with Mod A cues.  SLP Short Term Goal 4 (Week 1): Pt will sustain attention to basic familiar task for ~ 10 minutes with Mod A cues.   Skilled Therapeutic Interventions: Skilled treatment session focused on cognition goals. Pt asleep when SLP entered room and pt required more than a reasonable amount of time to arouse. Once awake pt required Mod A to Min A cues to sequence 3 picture cards. Pt was left in bed with all needs within reach. Continue per current plan of care.      Function:    Cognition Comprehension Comprehension assist level: Understands basic 50 - 74% of the time/ requires cueing 25 - 49% of the time  Expression   Expression assist level: Expresses basic 50 - 74% of the time/requires cueing 25 - 49% of the time. Needs to repeat parts of sentences.  Social Interaction Social Interaction assist level: Interacts appropriately 75 - 89% of the time - Needs redirection for appropriate language or to initiate interaction.  Problem Solving Problem solving assist level: Solves basic 50 - 74% of the time/requires cueing 25 - 49% of the time  Memory Memory assist level: Recognizes or recalls less than 25% of the time/requires cueing greater than 75% of the time    Pain Pain Assessment Pain Assessment: No/denies pain  Therapy/Group: Individual Therapy  Laura [name] 11/08/2017, 3:48 PM

## 2017-11-08 NOTE — Progress Notes (Signed)
Social Work  Social Work Assessment and Plan  Patient Details  Name: Laura [name] MRN: [id number] Date of Birth: 23-Jun-1961  Today's Date: 11/08/2017  Problem List:  Patient Active Problem List   Diagnosis Date Noted  . Abnormal MRI   . Uncontrolled type 2 diabetes mellitus with hyperglycemia ([facility])   . Diabetic peripheral neuropathy ([place])   . Weakness of both lower extremities   . Hyperkalemia   . Hypoglycemia   . Type 2 diabetes mellitus with complication, with long-term current use of insulin ([facility])   . Neurogenic bladder   . Hypoalbuminemia due to protein-calorie malnutrition ([place])   . AKI (acute kidney injury) ([place])   . Stage 3 chronic kidney disease ([place])   . Physical debility 11/05/2017  . Critical illness myopathy   . Encephalopathy   . Diabetes mellitus type 2 in nonobese ([facility])   . Hyperlipidemia   . Cognitive impairment   . Benign essential HTN   . Chronic diastolic congestive heart failure ([place])   . Chronic kidney disease (CKD), stage IV (severe) ([place])   . Acute blood loss anemia   . Anemia of chronic disease   . Leukocytosis   . Enteritis due to Clostridium difficile   . Urinary tract infection 10/30/2017  . Pressure injury of skin 10/30/2017   Past Medical History:  Past Medical History:  Diagnosis Date  . CHF (congestive heart failure) ([facility] [place] [facility] [place])   . Chronic bronchitis ([place])   . High cholesterol   . Hypertension   . Type II diabetes mellitus ([place])    Past Surgical History:  Past Surgical History:  Procedure Laterality Date  . CESAREAN SECTION    . TUBAL LIGATION     Social History:  reports that  has never smoked. she has never used smokeless tobacco. She reports that she does not drink alcohol or use drugs.  Family / Support Systems Marital Status: Married How Long?: 1993 Patient Roles: Spouse, Parent Spouse/Significant Other: [name] @ (C) [phone] Children: daughter, [name] (92 y.o. and living at home while she attends [employer] [facility] [employer]) @ (C)  [phone];  son, [name] - living in [place] but not at home. Anticipated Caregiver: Husband, daughter, others Ability/Limitations of Caregiver: Husband works 2nd shift [address].  Has an 63 yo daughter who can assist some; she goes to school.  Her 57 yo dtr is expecting and lives close by. Caregiver Availability: Other (Comment)(still attempting to reach spouse to confirm d/c plan and how they will cover 24/7) Family Dynamics: Pt describes her spouse and children as supportive, however, all are working or attending school.  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   Abuse/Neglect Abuse/Neglect Assessment Can Be Completed: Yes Physical Abuse: Denies Verbal Abuse: Denies Sexual Abuse: Denies Exploitation of patient/patient's resources: Denies Self-Neglect: Denies  Emotional Status Pt's affect, behavior adn adjustment status: Pt very soft spoken and difficult to understand at times.  She does attempt to answer basic questions.  Flat affect and admits frustration with this lengthy illness which began in November with PNA.  Pt denies any significant emotional distress, however, will likely refer for neuropsychology for additional support.   Recent Psychosocial Issues: None Pyschiatric History: None Substance Abuse History: None  Patient / Family Perceptions, Expectations &  Goals Pt/Family understanding of illness & functional limitations: Pt with very general understanding of her multiple medical issues and current state of debility/ need for CIR.  Still attempting to reach spouse. Premorbid pt/family roles/activities: Pt completely independent prior to  Nov. 2018 Anticipated changes in roles/activities/participation: Pt with significant debility and will need 24/7 support - still attempting to reach spouse to confirm caregiver. Pt/family expectations/goals: "I want to go home."  [employer]: None Premorbid Home Care/DME Agencies: None Transportation available at discharge: yes? Resource referrals recommended: Neuropsychology  Discharge Planning Living Arrangements: Spouse/significant other, Children Support Systems: Spouse/significant other, Children, Friends/neighbors Type of Residence: Private residence Insurance Resources: Multimedia programmer (specify)([employer] with [employer]) Financial Resources: Family Support Financial Screen Referred: No Living Expenses: Motgage Money Management: Spouse Does the patient have any problems obtaining your medications?: No Home Management: pt and spouse prior to Nov Patient/Family Preliminary Plans: Still attempting to confirm plan - per CIR [facility], plan for pt to d/c home with family to be arranging 24/7 support. [place] Barriers to Discharge: Lack of/limited family support(still to confirm assist available with spouse) Social Work Anticipated Follow Up Needs: HH/OP Expected length of stay: 18-23 days  Clinical Impression Very unfortunate, debilitated woman here following lengthy hospitalization beginning in Nov 2018 with PNA.  Did spend ~ 1 month at local SNF then transfer to [facility] with Cdiff.  On CIR due to severe debility and team anticipating min assist w/c level goals.  Have made unsuccessful attempts to reach spouse so far but continue to try in order to confirm how much assist family can provide.  Pt with very flat affect and anticipate may benefit from neuropsychology consult while here.  Will follow for support and d/c planning needs.  [name] 11/08/2017, 3:01 PM

## 2017-11-08 NOTE — Progress Notes (Signed)
Subjective/Complaints: Pt seen lying in bed this AM.  She states she slept well overnight.  She states she had a good weekend. No reported issues.   Review of systems: Unable to obtain due to cognition.  Objective: Vital Signs: Blood pressure (!) 162/83, pulse 86, temperature 98.1 F (36.7 C), temperature source Oral, resp. rate 16, height 4' 11" (1.499 m), weight 56.7 kg (125 lb), SpO2 96 %. No results found. Results for orders placed or performed during the hospital encounter of 11/05/17 (from the past 72 hour(s))  Glucose, capillary     Status: Abnormal   Collection Time: 11/05/17  9:37 PM  Result Value Ref Range   Glucose-Capillary 111 (H) 65 - 99 mg/dL   Comment 1 Notify RN   CBC WITH DIFFERENTIAL     Status: Abnormal   Collection Time: 11/06/17  4:41 AM  Result Value Ref Range   WBC 12.3 (H) 4.0 - 10.5 K/uL   RBC 2.74 (L) 3.87 - 5.11 MIL/uL   Hemoglobin 7.5 (L) 12.0 - 15.0 g/dL   HCT 23.4 (L) 36.0 - 46.0 %   MCV 85.4 78.0 - 100.0 fL   MCH 27.4 26.0 - 34.0 pg   MCHC 32.1 30.0 - 36.0 g/dL   RDW 17.1 (H) 11.5 - 15.5 %   Platelets 523 (H) 150 - 400 K/uL   Neutrophils Relative % 60 %   Neutro Abs 7.4 1.7 - 7.7 K/uL   Lymphocytes Relative 33 %   Lymphs Abs 4.0 0.7 - 4.0 K/uL   Monocytes Relative 5 %   Monocytes Absolute 0.6 0.1 - 1.0 K/uL   Eosinophils Relative 2 %   Eosinophils Absolute 0.3 0.0 - 0.7 K/uL   Basophils Relative 0 %   Basophils Absolute 0.1 0.0 - 0.1 K/uL    Comment: Performed at [facility], [address], [place], [place] [address]  Comprehensive metabolic panel     Status: Abnormal   Collection Time: 11/06/17  4:41 AM  Result Value Ref Range   Sodium 142 135 - 145 mmol/L   Potassium 5.1 3.5 - 5.1 mmol/L   Chloride 112 (H) 101 - 111 mmol/L   CO2 21 (L) 22 - 32 mmol/L   Glucose, Bld 95 65 - 99 mg/dL   BUN 64 (H) 6 - 20 mg/dL   Creatinine, Ser 2.26 (H) 0.44 - 1.00 mg/dL   Calcium 8.0 (L) 8.9 - 10.3 mg/dL   Total Protein 4.8 (L) 6.5 - 8.1 g/dL    Albumin 1.8 (L) 3.5 - 5.0 g/dL   AST 14 (L) 15 - 41 U/L   ALT 8 (L) 14 - 54 U/L   Alkaline Phosphatase 85 38 - 126 U/L   Total Bilirubin 0.4 0.3 - 1.2 mg/dL   GFR calc non Af Amer 23 (L) >60 mL/min   GFR calc Af Amer 27 (L) >60 mL/min    Comment: (NOTE) The eGFR has been calculated using the CKD EPI equation. This calculation has not been validated in all clinical situations. eGFR's persistently <60 mL/min signify possible Chronic Kidney Disease.    Anion gap 9 5 - 15    Comment: Performed at [facility] [address], [place], [place] [address]  Glucose, capillary     Status: None   Collection Time: 11/06/17  6:28 AM  Result Value Ref Range   Glucose-Capillary 98 65 - 99 mg/dL   Comment 1 Notify RN   Glucose, capillary     Status: None  Collection Time: 11/06/17 11:38 AM  Result Value Ref Range   Glucose-Capillary 97 65 - 99 mg/dL  Glucose, capillary     Status: Abnormal   Collection Time: 11/06/17  4:37 PM  Result Value Ref Range   Glucose-Capillary 116 (H) 65 - 99 mg/dL  Glucose, capillary     Status: Abnormal   Collection Time: 11/06/17  9:23 PM  Result Value Ref Range   Glucose-Capillary 123 (H) 65 - 99 mg/dL  Glucose, capillary     Status: Abnormal   Collection Time: 11/07/17  7:06 AM  Result Value Ref Range   Glucose-Capillary 118 (H) 65 - 99 mg/dL   Comment 1 Notify RN   Prepare RBC     Status: None   Collection Time: 11/07/17  9:08 AM  Result Value Ref Range   Order Confirmation      ORDER PROCESSED BY BLOOD BANK Performed at [facility], [place] [address] [place], [place] [address]   Type and screen [place]     Status: None (Preliminary result)   Collection Time: 11/07/17 11:05 AM  Result Value Ref Range   ABO/RH(D) AB POS    Antibody Screen NEG    Sample Expiration 11/10/2017    Unit Number [id number]    Blood Component Type RBC LR PHER1    Unit division 00    Status of Unit ISSUED    Transfusion Status OK TO  TRANSFUSE    Crossmatch Result      Compatible Performed at [facility], [place] [address], [place], [place] [address]   ABO/Rh     Status: None   Collection Time: 11/07/17 11:05 AM  Result Value Ref Range   ABO/RH(D)      AB POS Performed at [facility] [address], [place], [place] [address]   Glucose, capillary     Status: Abnormal   Collection Time: 11/07/17 11:46 AM  Result Value Ref Range   Glucose-Capillary 121 (H) 65 - 99 mg/dL  Glucose, capillary     Status: Abnormal   Collection Time: 11/07/17  4:59 PM  Result Value Ref Range   Glucose-Capillary 114 (H) 65 - 99 mg/dL  Hemoglobin and hematocrit, blood     Status: Abnormal   Collection Time: 11/07/17  6:15 PM  Result Value Ref Range   Hemoglobin 7.7 (L) 12.0 - 15.0 g/dL   HCT 23.8 (L) 36.0 - 46.0 %    Comment: Performed at [facility], [place] [address], [place], [place] [address]  Glucose, capillary     Status: Abnormal   Collection Time: 11/07/17  9:31 PM  Result Value Ref Range   Glucose-Capillary 158 (H) 65 - 99 mg/dL   Comment 1 Notify RN   Glucose, capillary     Status: Abnormal   Collection Time: 11/08/17  6:21 AM  Result Value Ref Range   Glucose-Capillary 150 (H) 65 - 99 mg/dL   Comment 1 Notify RN     General: NAD. Vital signs reviewed. HEENT: Normocephalic, atraumatic.  Cardio: RRR and no JVD. Resp: CTA B/L and Unlabored GI: BS positive and ND Musc/Skel:  No edmea or tenderness. Neuro: Alert and Oriented x2. Motor:  LUE: 5/5 proximal to distal RUE: 4+/5 proximal to distal RLE: HF: 2/5, ADF/PF 2/5 LLE: HF 2/5, ADF/PF 2+/5 Skin:   Intact. Warm and dry.    Assessment/Plan: 1. Functional deficits secondary to debility and encephalopathy which require 3+ hours per day  of interdisciplinary therapy in a comprehensive inpatient rehab setting. Physiatrist is providing close team supervision and 24 hour management of active medical problems listed below. Physiatrist and rehab team  continue to assess barriers to discharge/monitor patient progress toward functional and medical goals. FIM: Function - Bathing Position: Bed Body parts bathed by patient: Right arm, Left arm, Chest, Abdomen, Front perineal area, Right upper leg, Left upper leg Body parts bathed by helper: Buttocks, Right lower leg, Left lower leg Assist Level: Touching or steadying assistance(Pt > 75%)  Function- Upper Body Dressing/Undressing What is the patient wearing?: Hospital gown Assist Level: Touching or steadying assistance(Pt > 75%) Function - Lower Body Dressing/Undressing What is the patient wearing?: Underwear, Hospital Gown Position: Bed Underwear - Performed by helper: Thread/unthread right underwear leg, Thread/unthread left underwear leg Assist for footwear: Partial/moderate assist Assist for lower body dressing: Touching or steadying assistance (Pt > 75%)        Function - Chair/bed transfer Chair/bed transfer method: Squat pivot Chair/bed transfer assist level: Total assist (Pt < 25%) Chair/bed transfer assistive device: Armrests Chair/bed transfer details: Manual facilitation for weight bearing, Manual facilitation for weight shifting, Manual facilitation for placement, Verbal cues for technique, Tactile cues for initiation, Tactile cues for placement  Function - Locomotion: Wheelchair Will patient use wheelchair at discharge?: Yes Type: Manual Max wheelchair distance: 100' Assist Level: Touching or steadying assistance (Pt > 75%) Assist Level: Touching or steadying assistance (Pt > 75%) Wheel 150 feet activity did not occur: Safety/medical concerns Turns around,maneuvers to table,bed, and toilet,negotiates 3% grade,maneuvers on rugs and over doorsills: No Function - Locomotion: Ambulation Ambulation activity did not occur: Safety/medical concerns Walk 10 feet activity did not occur: Safety/medical concerns Walk 50 feet with 2 turns activity did not occur: Safety/medical  concerns Walk 150 feet activity did not occur: Safety/medical concerns Walk 10 feet on uneven surfaces activity did not occur: Safety/medical concerns  Function - Comprehension Comprehension: Auditory Comprehension assist level: Understands basic less than 25% of the time/ requires cueing >75% of the time  Function - Expression Expression: Verbal Expression assist level: Expresses basis less than 25% of the time/requires cueing >75% of the time.  Function - Social Interaction Social Interaction assist level: Interacts appropriately less than 25% of the time. May be withdrawn or combative.  Function - Problem Solving Problem solving assist level: Solves basic less than 25% of the time - needs direction nearly all the time or does not effectively solve problems and may need a restraint for safety  Function - Memory Memory assist level: Recognizes or recalls less than 25% of the time/requires cueing greater than 75% of the time Patient normally able to recall (first 3 days only): That he or she is in a hospital, Current season  Medical Problem List and Plan: 1.Functional and mobility deficitssecondary to debility and encephalopathy from multiple medical issues   Cont CIR   Notes reviewed, labs reviewed, discussed with physician - transfused 2. DVT Prophylaxis/Anticoagulation: Pharmaceutical:Lovenox 3. Pain Management:tylenol prn 4. Mood:LCSW to follow for evaluation and support. 5. Neuropsych: This patientis not fullycapable of making decisions onherown behalf. 6. Skin/Wound Care:Air mattress for pressure relief measures. Local measures to help managesacral /labialMASD PRAFO's for bilateral LE's 7. Fluids/Electrolytes/Nutrition:Monitor I/O.    Continue to offer supplements to help with protein calorie malnutrition.   Cont Remeron 15 8. C diff colitis:    Continue Vancomycin198m po q6 hours initiated 1/27 for 10 doses (through 2/5) Continue  enteric precautions Stools are improving 9. HTN: Monitor  BP bid.    Continue nifedipine bid   Imdur increased to 90 on 2/4   Metoprolol increased to 100 BID on 3/2 Vitals:   11/07/17 2143 11/08/17 0600  BP: (!) 141/77 (!) 162/83  Pulse: 96 86  Resp:  16  Temp:  98.1 F (36.7 C)  SpO2:  96%  10 T2DM with retinopathy, neuropathy, andnephropathy:Monitor BS ac/hs. Continue Lantus 5 units with SSI for elevated BS CBG (last 3)  Recent Labs    11/07/17 1659 11/07/17 2131 11/08/17 0621  GLUCAP 114* 158* 150*    Relatively controlled 2/4 11. H/o depression: Continue Effexor and Remeron.  12. Neurogenic bladder:Has chronic indwelling catheter for 2+ months.   Continue foley due to incontinence and for wound healing. 13.AKI onCKD with history of overload:    Cr 2.26 on 2/2   Labs ordered for tomorrow   Encourage fluids   Cont to monitor 14. Diffuse CAD: treated medically with Metoprolol, Zetia and Plavix. 15.  ABLA on anemia of chronic disease   Symptomatic anemia, poor PT tolerance dizziness, fatigue   Hgb 7.4 has been trending down.     Check stool OB pending   Hb 7.7 on 2/3 after transfusion (from 7.5), will order labs for tomorrow 16. Leukocytosis   WBCs 12.3 on 2/4   Labs ordered for tomorrow 17. Hypoalbuminemia   Cont supplement  LOS (Days) 3 A FACE TO FACE EVALUATION WAS PERFORMED  Ankit Lorie Phenix 11/08/2017, 9:34 AM

## 2017-11-08 NOTE — Significant Event (Signed)
Hypoglycemic Event  CBG: 44  Treatment: 1 tube instant glucose  Symptoms: dizzy  Follow-up CBG: Time:1729 CBG Result:40  Possible Reasons for Event: Unknown  Comments/MD notified:Dan Wheeling, PA    Ran Tullis H

## 2017-11-08 NOTE — Progress Notes (Signed)
Sacral area examined with nurse. Patient incontinent of bowel and had stool under foam dressing. During exam, she had another episode of bowel incontinence of small amount of soft formed stool.  Has further breakdown on sacrum--is very bony and concerned about having wound progress to stage IV due to poor nutritional state and immobility. WOC consulted for input.

## 2017-11-08 NOTE — Progress Notes (Signed)
Retta Diones, RN  Rehab Admission Coordinator  Physical Medicine and Rehabilitation  PMR Pre-admission  Signed  Date of Service:  11/05/2017 2:55 PM       Related encounter: ED to Hosp-Admission (Discharged) from 10/30/2017 in [place]            _0 Hide copied text  _1 Hover for details   PMR Admission Coordinator Pre-Admission Assessment  Patient: Laura [name] is an 57 y.o., female MRN: [id number] DOB: Jul 25, 1961 Height: _2  (149.9 cm) Weight: 54.9 kg (121 lb)                                                                                                                                 Insurance Information HMO:       PPO:       PCP:       IPA:       80/20:       OTHER: [employer] PRIMARY: [facility] with [employer]      Policy#: [id number]      Subscriber:  Alvan Dame - spouse CM Name: Roxanna Mew      Phone#:  [phone] X [phone]     Fax#:  [phone] Pre-Cert#:  [id number] from 2/1 to 11/09/17 with updates due 11/09/17      Employer:  [employer] - spouse Benefits:  Phone #:  [phone]     Name:  Juan/Cloe/Anne Eff. Date: 08/09/17     Deduct:  $3000 (met $752.41      Out of Pocket Max:  $5400 (met $752.41)      Life Max: N/A CIR: 80% with auth      SNF: 80% with 120 days max Outpatient: 80%     Co-Pay: 20% Home Health: 80% with 120 visits/year      Co-Pay: 20% DME: 80%     Co-Pay: 20% Providers: in network  Medicaid Application Date:        Case Manager:   Disability Application Date:        Case Worker:    Emergency Tax adviser Information    Name Relation Home Work Mobile   Dubach,Michael Spouse   [phone]   Tangy, Drozdowski Daughter   [phone]     Current Medical History  Patient Admitting Diagnosis: Debility   History of Present Illness: A 57 year old female (from [place])with T2DM with neuropathy and retinopathy,  medical HTN, medical non-compliance,CKDstage IV, [facility] syndrome with fluid overload and encephalopathy. She was treated with HD briefly and extensive cardiac, renal and neurologic work up done revealing diffuse CAD, depression, urinary retention due to neurogenic bladder as well as ADEM. She was treated with steroids and discharged to [facility] rehab on 10/07/17. Will likely need HD in the near future per notes. She was admitted to [facility]  10/30/17 with weakness and lethargy, loose stools and leucocytosis. She was started on IV rocephin due to concerns of urosepsis and bouts of diarrhea due to C diff colitis. BC and UCS negative but stools positive for C diff. She was started on oral vancomycin for treatment and has had decrease in loose stools. WOC following input on MASD due to fecal and urinary incontinence. Po intake poor and multiple supplements added to help with malnutrition. She continues to have diffuse weakness query ciritcal illness myopathy as well as severe debility. CIR recommended due to functional deficits.   Past Medical History      Past Medical History:  Diagnosis Date  . CHF (congestive heart failure) ([place])   . Chronic bronchitis ([place])   . High cholesterol   . Hypertension   . Type II diabetes mellitus ([facility])     Family History  family history includes Diabetes in her father.  Prior Rehab/Hospitalizations: Was admitted to [facility] 08/21/17 and then to [facility] on 10/07/17.  Has the patient had major surgery during 100 days prior to admission? No  Current Medications   Current Facility-Administered Medications:  .  clopidogrel (PLAVIX) tablet 75 mg, 75 mg, Oral, Daily, Ledell Noss, MD, 75 mg at 11/05/17 2774 .  dextrose 50 % solution 50 mL, 1 ampule, Intravenous, BID PRN, Ledell Noss, MD, 25 mL at 10/31/17 1287 .  ezetimibe (ZETIA) tablet 10 mg, 10 mg, Oral, Daily, Ledell Noss, MD, 10 mg at 11/05/17 [phone] .  feeding  supplement (GLUCERNA SHAKE) (GLUCERNA SHAKE) liquid 237 mL, 237 mL, Oral, BID BM, Narendra, Nischal, MD, 237 mL at 11/05/17 1430 .  feeding supplement (PRO-STAT SUGAR FREE 64) liquid 30 mL, 30 mL, Oral, BID, Axel Filler, MD, 30 mL at 11/05/17 [phone] .  Gerhardt's butt cream, , Topical, QID, Axel Filler, MD .  heparin injection 5,000 Units, 5,000 Units, Subcutaneous, Q8H, Ledell Noss, MD, 5,000 Units at 11/05/17 1429 .  hydrocerin (EUCERIN) cream, , Topical, BID, Axel Filler, MD .  insulin aspart (novoLOG) injection 0-9 Units, 0-9 Units, Subcutaneous, TID WC, Ina Homes, MD, 2 Units at 11/04/17 1742 .  insulin glargine (LANTUS) injection 5 Units, 5 Units, Subcutaneous, Daily, Ledell Noss, MD, 5 Units at 11/05/17 0940 .  isosorbide mononitrate (IMDUR) 24 hr tablet 60 mg, 60 mg, Oral, Daily, Ledell Noss, MD, 60 mg at 11/05/17 4709 .  metoprolol tartrate (LOPRESSOR) tablet 50 mg, 50 mg, Oral, BID, Ledell Noss, MD, 50 mg at 11/05/17 6283 .  mirtazapine (REMERON) tablet 15 mg, 15 mg, Oral, QHS, Ledell Noss, MD, 15 mg at 11/04/17 2210 .  NIFEdipine (PROCARDIA-XL/ADALAT-CC/NIFEDICAL-XL) 24 hr tablet 30 mg, 30 mg, Oral, BID, Ledell Noss, MD, 30 mg at 11/05/17 6629 .  nystatin cream (MYCOSTATIN), , Topical, BID, Santos-Sanchez, Idalys, MD .  vancomycin (VANCOCIN) 50 mg/mL oral solution 125 mg, 125 mg, Oral, Q6H, Santos-Sanchez, Idalys, MD, 125 mg at 11/05/17 1235 .  venlafaxine XR (EFFEXOR-XR) 24 hr capsule 150 mg, 150 mg, Oral, Q breakfast, Ledell Noss, MD, 150 mg at 11/05/17 4765  Patients Current Diet: Diet Carb Modified Fluid consistency: Thin; Room service appropriate? Yes Diet - low sodium heart healthy  Precautions / Restrictions Precautions Precautions: Fall Restrictions Weight Bearing Restrictions: No   Has the patient had 2 or more falls or a fall with injury in the past year?No.  Husband reports that she slipped and slid down the stairs X 1 in the recent past  with no injury.  Prior Activity Level  Household: Went out 3 to 4 times a week prior to 08/21/17  Home Assistive Devices / Equipment Home Assistive Devices/Equipment: None  Prior Device Use: Indicate devices/aids used by the patient prior to current illness, exacerbation or injury? None  Prior Functional Level Prior Function Level of Independence: Needs assistance Gait / Transfers Assistance Needed: per chart review patient with limited ambulatory status at SNF ADL's / Homemaking Assistance Needed: pt reports she waws able to assist with tasks but requiring assistance for all ADL.   Self Care: Did the patient need help bathing, dressing, using the toilet or eating?  Independent  Indoor Mobility: Did the patient need assistance with walking from room to room (with or without device)? Independent  Stairs: Did the patient need assistance with internal or external stairs (with or without device)? Independent  Functional Cognition: Did the patient need help planning regular tasks such as shopping or remembering to take medications? Independent  Current Functional Level Cognition  Overall Cognitive Status: No family/caregiver present to determine baseline cognitive functioning Orientation Level: Oriented X4 Following Commands: Follows multi-step commands inconsistently Safety/Judgement: Decreased awareness of safety General Comments: Pt requiring verbal and tactile cues to follow multiple step commands. Pt demonstrating decreased awareness throughout session.     Extremity Assessment (includes Sensation/Coordination)  Upper Extremity Assessment: Generalized weakness  Lower Extremity Assessment: Generalized weakness    ADLs  Overall ADL's : Needs assistance/impaired Eating/Feeding: Set up, Sitting Grooming: Minimal assistance, Sitting Upper Body Bathing: Moderate assistance, Sitting Lower Body Bathing: Total assistance, Sit to/from stand Upper Body Dressing : Moderate  assistance, Sitting Lower Body Dressing: Total assistance, Sit to/from stand Toilet Transfer: Total assistance, Stand-pivot Toileting- Clothing Manipulation and Hygiene: Total assistance, Sit to/from stand Functional mobility during ADLs: Total assistance General ADL Comments: Pt able to take slight amount of weight through B LE to complete stand-pivot simulated toilet transfer but continues to require total assistance. Requires support for balance at EOB    Mobility  Overal bed mobility: Needs Assistance Bed Mobility: Supine to Sit Supine to sit: Min assist General bed mobility comments: assist to elevate trunk to upright, light assist/facilitation to scoot to EOB in sitting    Transfers  Overall transfer level: Needs assistance Equipment used: None Transfers: Squat Pivot Transfers Sit to Stand: Total assist Squat pivot transfers: Max assist, Total assist General transfer comment: partial stand/squat pivot bed<>chair with PT blocking knees as pt is too weak to WB through LEs  and  has poor trunk control     Ambulation / Gait / Stairs / Wheelchair Mobility  Ambulation/Gait General Gait Details: unable     Posture / Balance Dynamic Sitting Balance Sitting balance - Comments: pt is able to maintain static sit without UE support and close supervision ~ 75mns, requires UE support for dynamic balance Balance Overall balance assessment: Needs assistance Sitting-balance support: Bilateral upper extremity supported, Feet supported Sitting balance-Leahy Scale: Fair Sitting balance - Comments: pt is able to maintain static sit without UE support and close supervision ~ 273ms, requires UE support for dynamic balance Standing balance support: During functional activity Standing balance-Leahy Scale: Zero Standing balance comment: pt is unable to stand and WB today without maximal support from therapist    Special needs/care consideration BiPAP/CPAP No CPM No Continuous Drip IV  No Dialysis Not at this time       Life Vest No Oxygen No Special Bed No Trach Size NO Wound Vac (area) No      Skin Has a coccyx wound  Bowel mgmt: Last BM 10/07/17, incontinence Bladder mgmt: Urinary catheter Diabetic mgmt Yes, on insulin at home Enteric precautions: Yes   Previous [place] [facility]: [facility]  Home Care Services: No  Discharge Living Setting Plans for Discharge Living Setting: Patient's home, House, Lives with (comment)(Lives with husband.) Type of Home at Discharge: House Discharge Home Layout: Two level, 1/2 bath on main level, Bed/bath upstairs Alternate Level Stairs-Number of Steps: 16 steps Discharge Home Access: Stairs to enter Entrance Stairs-Number of Steps: 1 step entry which is 2-3 inch step. Does the patient have any problems obtaining your medications?: No  Social/Family/Support Systems Patient Roles: Spouse, Parent(Has a husband and 3 children.) Contact Information: Floy Angert - spouse Anticipated Caregiver: Husband, daughter, others Anticipated Caregiver's Contact Information: Legrand Como - husband - [phone] Ability/Limitations of Caregiver: Husband works 2nd shift [address].  Has an 52 yo daughter who can assist some; she goes to school.  Her 57 yo dtr is expecting and lives close by. Caregiver Availability: Other (Comment)(Husband is aware of need for 24/7 care after rehab discharge) Discharge Plan Discussed with Primary Caregiver: Yes Is Caregiver In Agreement with Plan?: Yes Does Caregiver/Family have Issues with Lodging/Transportation while Pt is in Rehab?: No  Goals/Additional Needs Patient/Family Goal for Rehab: PT/OT min to mod assist, SLP supervision to min assist goals Expected length of stay: 18-23 days Cultural Considerations: [employer].  From the [place].  Speaks English. Dietary Needs: Carb mod, med cal, thin liquids Equipment Needs: TBD Pt/Family Agrees to  Admission and willing to participate: Yes Program Orientation Provided & Reviewed with Pt/Caregiver Including Roles  & Responsibilities: Yes  Decrease burden of Care through IP rehab admission: N/A  Possible need for SNF placement upon discharge: Yes, if patient cannot progress to level where family can manage at home.  Husband is aware that there is no guarantee for SNF from [facility].  Case manager did say that they would consider SNF based on patient progress.  Patient Condition: This patient's medical and functional status has changed since the consult dated: 11/03/17 in which the Rehabilitation Physician determined and documented that the patient's condition is appropriate for intensive rehabilitative care in an inpatient rehabilitation facility. See "History of Present Illness" (above) for medical update. Functional changes are: Currently requiring max to total assist for squat pivot transfers. Patient's medical and functional status update has been discussed with the Rehabilitation physician and patient remains appropriate for inpatient rehabilitation. Will admit to inpatient rehab today.  Preadmission Screen Completed By:  Retta Diones, 11/05/2017 3:16 PM ______________________________________________________________________   Discussed status with Dr. Naaman Plummer on 11/05/17 at 1516 and received telephone approval for admission today.  Admission Coordinator:  Retta Diones, time 1516/Date 11/05/17             Cosigned by: Meredith Staggers, MD at 11/05/2017 3:21 PM  Revision History

## 2017-11-08 NOTE — Significant Event (Signed)
Hypoglycemic Event  CBG: 40  Treatment: 15 GM carbohydrate snack  Symptoms: None  Follow-up CBG: Time:1816 CBG Result:77  Possible Reasons for Event: Unknown  Comments/MD notified:Dan [place], PA    Jahzir Strohmeier H

## 2017-11-08 NOTE — Progress Notes (Signed)
Physical Therapy Session Note  Patient Details  Name: Laura [name] MRN: [id number] Date of Birth: December 01, 1960  Today's Date: 11/08/2017 PT Individual Time: 1132-1202 PT Individual Time Calculation (min): 30 min   Short Term Goals: Week 1:  PT Short Term Goal 1 (Week 1): Pt will perform sit<>stand transfer using LRAD, Max assist x1 PT Short Term Goal 2 (Week 1): Pt will tolerate sitting up in w/c in between therapies for 1 hour w/o increase in fatigue PT Short Term Goal 3 (Week 1): Pt will maintain dynamic sitting balance w/ min assist  Skilled Therapeutic Interventions/Progress Updates:    Pt sitting in recliner upon arrival and agreeable to PT session. Performing resisted leg extension and UE press pull with manual resistance. Working on scooting in chair and progressing to sit<>stand using bariatric stedy. Unable to fully stand on first attempt but able to achieve full standing on second try (using max assist). Once standing, performing sit<>stand X3 with work on posture and standing endurance. Following session, pt sitting in recliner with QRB and chair alarm on and all needs in reach. Reports feeling better after doing some standing.   Therapy Documentation Precautions:  Precautions Precautions: Fall Precaution Comments: monitor blood pressure Restrictions Weight Bearing Restrictions: No   Pain: Mild pain in LEs, monitored during session.  See Function Navigator for Current Functional Status.   Therapy/Group: Individual Therapy  Linard Millers, PT 11/08/2017, 12:48 PM

## 2017-11-08 NOTE — Progress Notes (Signed)
[name], MD  Physician  Physical Medicine and Rehabilitation  Consult Note  Addendum  Date of Service:  11/03/2017 6:15 AM       Related encounter: ED to Hosp-Admission (Discharged) from 10/30/2017 in [place] All Collapse All       [] Hide copied text  [] Hover for details        Physical Medicine and Rehabilitation Consult Reason for Consult:decreased functional mobility Referring Physician: [facility]   HPI: Laura [name] is a 57 y.o.right handed female with history of type 2 diabetes mellitus,cognitive impairment, hyperlipidemia, hypertension, diastolic congestive heart failure with history of admission to [facility] November 2119 for systolic heart failure and was discharged to [facility] skilled nursing facility. History taken from chart review. Patient also that time found to be in acute renal failure needing several sessions of hemodialysis. Presented 10/30/2017 from skilled nursing facility with hypoglycemia and lethargy as well as bouts of loose stool. WBC 18,700, creatinine 2.43, BUN 52. Blood cultures no growth. Urine culture multiple species. Lactic acid within normal limits at 0.52. Chest x-ray showed bilateral effusions with dependent atelectasis. C. Difficile specimen was positive and currently maintained on a 10 day course of oral vancomycin. WOC follow-up for unstageable pressure injury to coccyx is with wound care as directed. Subcutaneous heparin for DVT prophylaxis. WBC has improved to 14,300 and latest creatinine 2.44. Physical therapy continues to provide assistance for patient with latest progress note 11/02/2017 with recommendations for physical medicine rehabilitation consult as family had requested discharge to home if at all possible and not return back to skilled nursing facility.  Review of Systems  Constitutional: Negative for chills.  HENT: Negative for hearing loss.     Eyes: Negative for blurred vision.  Respiratory: Negative for cough and shortness of breath.   Cardiovascular: Negative for chest pain and palpitations.  Gastrointestinal: Positive for diarrhea and nausea.  Genitourinary: Negative for dysuria and hematuria.  Musculoskeletal: Positive for joint pain and myalgias.  Skin: Negative for rash.  Neurological: Positive for weakness. Negative for seizures.  All other systems reviewed and are negative.      Past Medical History:  Diagnosis Date  . Diabetes mellitus without complication ([place])    No past surgical history. No pertinent family history of C-diff. Social History:  reports that  has never smoked. She does not have any smokeless tobacco history on file. She reports that she does not drink alcohol or use drugs. Allergies:      Allergies  Allergen Reactions  . Aspirin Nausea Only         Medications Prior to Admission  Medication Sig Dispense Refill  . acetaminophen (TYLENOL) 325 MG tablet Take 650 mg by mouth every 4 (four) hours as needed for mild pain.    Marland Kitchen aluminum-magnesium hydroxide-simethicone (MAALOX) [phone] MG/5ML SUSP Take 60 mLs by mouth 4 (four) times daily -  before meals and at bedtime.    . bisacodyl (DULCOLAX) 5 MG EC tablet Take 10 mg by mouth daily as needed for moderate constipation.    . clopidogrel (PLAVIX) 75 MG tablet Take 75 mg by mouth daily.    Marland Kitchen ezetimibe (ZETIA) 10 MG tablet Take 10 mg by mouth daily.    . furosemide (LASIX) 20 MG tablet Take 20 mg by mouth daily.    . insulin aspart (NOVOLOG) 100 UNIT/ML injection Inject 0-10 Units into the skin See admin instructions. If blood sugar  is <70 call MD, 71-250=0, 251-300=2u, 301-350=4,351-400=6u, 401-500=8u, >500=10u and call MD    . insulin glargine (LANTUS) 100 UNIT/ML injection Inject 5 Units into the skin at bedtime.    . isosorbide mononitrate (IMDUR) 60 MG 24 hr tablet Take 60 mg by mouth daily.    . metoprolol tartrate  (LOPRESSOR) 50 MG tablet Take 50 mg by mouth 2 (two) times daily.    . mirtazapine (REMERON) 15 MG tablet Take 15 mg by mouth at bedtime.    . modafinil (PROVIGIL) 200 MG tablet Take 200 mg by mouth daily.    Marland Kitchen NIFEdipine (PROCARDIA-XL/ADALAT-CC/NIFEDICAL-XL) 30 MG 24 hr tablet Take 30 mg by mouth 2 (two) times daily.    . pantoprazole (PROTONIX) 40 MG tablet Take 40 mg by mouth daily.    Marland Kitchen PRESCRIPTION MEDICATION Take 120 mLs by mouth 2 (two) times daily. Med Pass    . simethicone (MYLICON) 80 MG chewable tablet Chew 80 mg by mouth 4 (four) times daily as needed for flatulence.    . venlafaxine XR (EFFEXOR-XR) 150 MG 24 hr capsule Take 150 mg by mouth daily with breakfast.    . venlafaxine XR (EFFEXOR-XR) 75 MG 24 hr capsule Take 75 mg by mouth daily with breakfast.      Home: Home Living Family/patient expects to be discharged to:: Skilled nursing facility  Functional History: Prior Function Level of Independence: Needs assistance Gait / Transfers Assistance Needed: per chart review patient with limited ambulatory status at SNF Functional Status:  Mobility: Bed Mobility Overal bed mobility: Needs Assistance Bed Mobility: Supine to Sit Supine to sit: Min assist General bed mobility comments: MinA for LEs and to power up trunk at edge of bed  Transfers Overall transfer level: Needs assistance Equipment used: None Transfers: Sit to/from Stand, Stand Pivot Transfers Sit to Stand: Max assist General transfer comment: performed via face to face transfer, knees blocked B; knees tend to buckle and patient is reliant on support from therapist, unable to take steps  Ambulation/Gait General Gait Details: unable   ADL:  Cognition: Cognition Overall Cognitive Status: Impaired/Different from baseline Orientation Level: Oriented X4 Cognition Arousal/Alertness: Awake/alert Behavior During Therapy: WFL for tasks assessed/performed Overall Cognitive Status:  Impaired/Different from baseline Area of Impairment: Safety/judgement, Problem solving Safety/Judgement: Decreased awareness of safety Problem Solving: Requires verbal cues, Requires tactile cues, Difficulty sequencing General Comments: requires multiple tactile cues for safe transfers, generally able to follow simple cues/commands   Blood pressure (!) 151/87, pulse 97, temperature 98.6 F (37 C), temperature source Oral, resp. rate 20, height 4\' 11"  (1.499 m), weight 77.6 kg (171 lb), SpO2 97 %. Physical Exam  Vitals reviewed. Constitutional: She appears well-nourished.  Small frame  HENT:  Head: Normocephalic and atraumatic.  Eyes: EOM are normal. Right eye exhibits no discharge. Left eye exhibits no discharge.  Pupils reactive to light  Neck: Normal range of motion. Neck supple. No thyromegaly present.  Cardiovascular: Normal rate and regular rhythm.  Respiratory: Effort normal and breath sounds normal. No respiratory distress.  GI: Soft. Bowel sounds are normal. She exhibits no distension.  Musculoskeletal:  No edema or tenderness in extremities  Neurological: She is alert.  Limited by language Mood is very flat.  She was able to provide her name and age as well as place.  Poor medical historian.  Follow simple commands. Motor: B/l UE 4/5 proximal to distal RLE: HF, KE, ADF/PF 2 /5 LLE: HF, KE 2/5, ADF/PF 3/5  Skin:  Coccyx skin ulcer not examined  as patient could not be turned without assistance  Psychiatric:  Limited interaction due to language        Assessment/Plan: Diagnosis: Debility Labs independently reviewed.  Records reviewed and summated above.  1. Does the need for close, 24 hr/day medical supervision in concert with the patient's rehab needs make it unreasonable for this patient to be served in a less intensive setting? Yes  2. Co-Morbidities requiring supervision/potential complications: type 2 diabetes mellitus (Monitor in accordance with exercise and  adjust meds as necessary), cognitive impairment, hyperlipidemia (cont meds), HTN (monitor and provide prns in accordance with increased physical exertion and pain), diastolic congestive heart failure (monitor for signs/symptoms of fluid overload), C. Difficile (cont abx), CKD (avoid nephrotoxic meds), leukocytosis (cont to monitor for signs and symptoms of infection, further workup if indicated), ABLA on anemia of chronic disease (transfuse if necessary to ensure appropriate perfusion for increased activity tolerance) 3. Due to bowel management, safety, skin/wound care, disease management, medication administration and patient education, does the patient require 24 hr/day rehab nursing? Yes 4. Does the patient require coordinated care of a physician, rehab nurse, PT (1-2 hrs/day, 5 days/week), OT (1-2 hrs/day, 5 days/week) and SLP (1-2 hrs/day, 5 days/week) to address physical and functional deficits in the context of the above medical diagnosis(es)? Yes Addressing deficits in the following areas: balance, endurance, locomotion, strength, transferring, bathing, dressing, toileting, cognition and psychosocial support 5. Can the patient actively participate in an intensive therapy program of at least 3 hrs of therapy per day at least 5 days per week? Potentially 6. The potential for patient to make measurable gains while on inpatient rehab is excellent 7. Anticipated functional outcomes upon discharge from inpatient rehab are min assist and mod assist  with PT, min assist and mod assist with OT, supervision and min assist with SLP. 8. Estimated rehab length of stay to reach the above functional goals is: 18-23 days. 9. Anticipated D/C setting: Home 10. Anticipated post D/C treatments: HH therapy and Home excercise program 11. Overall Rehab/Functional Prognosis: good  RECOMMENDATIONS: This patient's condition is appropriate for continued rehabilitative care in the following setting: Consider workup up for  LE weakness.  CIR. Patient has agreed to participate in recommended program. Potentially Note that insurance prior authorization may be required for reimbursement for recommended care.  Comment: Rehab Admissions Coordinator to follow up.  Delice Lesch, MD, ABPMR Lavon Paganini Angiulli, PA-C 11/03/2017      Revision History                             Routing History

## 2017-11-08 NOTE — Progress Notes (Signed)
Occupational Therapy Session Note  Patient Details  Name: Laura [name] MRN: [id number] Date of Birth: 29-Jun-1961  Today's Date: 11/08/2017 OT Individual Time: 1660-6301 OT Individual Time Calculation (min): 60 min    Short Term Goals: Week 1:  OT Short Term Goal 1 (Week 1): Pt will completed UB dressing with minA  OT Short Term Goal 2 (Week 1): Pt will complete LB dressing with modA OT Short Term Goal 3 (Week 1): Pt will perform shower transfer with mod A  OT Short Term Goal 4 (Week 1): Pt will particpiate in OOB activities for 30 mins OT Short Term Goal 5 (Week 1): Pt will complete grooming at sink side with min A   Skilled Therapeutic Interventions/Progress Updates:    Pt received in recliner stating she had already received self care today.  She reported she no longer had difficulty with dizziness.  Pt spent the session working on postural/ trunk control in sitting with mod A to achieve forward pelvic tilt, spinal extension, lateral wt shifts. AROM of BUE - L arm with mild incoordination due to weakness.  Chair pushups with B hands on armrests focusing on forward lean with mod A to lift hips.  Reciprocal scooting forward and back to engage core.  Pt is very petite and needs to reciprocal scoot to get her hips all the way back in the recliners. Pt was able to participate for a few minutes at a time, rest for a few minutes, but was able to do the entire hour of therapy.  Discussed bed level exercises she can do at home.  Positioned with towel role at lumbar spine and pillows on each side of her hips.  Quick release belt on, chair alarm on.  All needs met.   Therapy Documentation Precautions:  Precautions Precautions: Fall Precaution Comments: monitor blood pressure Restrictions Weight Bearing Restrictions: No  Pain: Pain Assessment Pain Assessment: No/denies pain ADL: ADL Eating: Not assessed Grooming: Minimal assistance, Setup Where Assessed-Grooming: Bed level Upper Body  Bathing: Minimal assistance Where Assessed-Upper Body Bathing: Bed level Lower Body Bathing: Moderate assistance Where Assessed-Lower Body Bathing: Bed level Upper Body Dressing: Moderate assistance Where Assessed-Upper Body Dressing: Bed level Lower Body Dressing: Maximal assistance Where Assessed-Lower Body Dressing: Bed level Toileting: Maximal assistance Where Assessed-Toileting: Bedside Commode Toilet Transfer: Unable to assess(due to dizziness and BP ) Toilet Transfer Method: Unable to assess Tub/Shower Transfer: Unable to assess Tub/Shower Transfer Method: Unable to assess Social research officer, government: Unable to assess Social research officer, government Method: Unable to assess ADL Comments: unable to completed transfers at time of eval. pt dizzinessn oted upon upright sitting-=bp monitored throughout session in sitting and HOB elevated   See Function Navigator for Current Functional Status.   Therapy/Group: Individual Therapy  [place] 11/08/2017, 12:36 PM

## 2017-11-08 NOTE — IPOC Note (Signed)
Overall Plan of Care [facility]) Patient Details Name: Laura [name] MRN: [id number] DOB: [birth year]  Admitting Diagnosis: <principal problem not specified>  Hospital Problems: Active Problems:   Physical debility   Critical illness myopathy   Encephalopathy   Type 2 diabetes mellitus with complication, with long-term current use of insulin (HCC)   Neurogenic bladder   Hypoalbuminemia due to protein-calorie malnutrition (HCC)   AKI (acute kidney injury) ([place])   Stage 3 chronic kidney disease ([place])     Functional Problem List: Nursing Behavior, Bladder, Bowel, Endurance, Medication Management, Motor, Nutrition, Pain, Perception, Safety, Sensory, Skin Integrity  PT Balance, Behavior, Safety, Endurance, Motor  OT Balance, Safety, Cognition, Endurance, Motor  SLP Cognition, Nutrition  TR         Basic ADL's: OT Eating, Bathing, Dressing, Toileting, Grooming     Advanced  ADL's: OT       Transfers: PT Bed Mobility, Bed to Chair, Car, Manufacturing systems engineer, Metallurgist: PT Emergency planning/management officer, Data processing manager, Ambulation     Additional Impairments: OT    SLP Social Cognition   Social Interaction, Problem Solving, Memory, Attention, Awareness  TR      Anticipated Outcomes Item Anticipated Outcome  Self Feeding Mod I  Swallowing      Basic self-care  MinA  Toileting  MinA   Bathroom Transfers MinA   Bowel/Bladder  min assist  Transfers  min assist  Locomotion  min assist w/c level  Communication     Cognition  Supervision  Pain  less<3  Safety/Judgment  min assist   Therapy Plan: PT Intensity: Minimum of 1-2 x/day ,45 to 90 minutes PT Frequency: 5 out of 7 days PT Duration Estimated Length of Stay: 18-20 days OT Intensity: Minimum of 1-2 x/day, 45 to 90 minutes OT Frequency: 5 out of 7 days OT Duration/Estimated Length of Stay: 18-21 days  SLP Intensity: Minumum of 1-2 x/day, 30 to 90 minutes SLP Frequency: 3 to 5 out of 7 days SLP  Duration/Estimated Length of Stay: 18 to 21 days    Team Interventions: Nursing Interventions Patient/Family Education, Bladder Management, Bowel Management, Pain Management, Skin Care/Wound Management, Disease Management/Prevention, Medication Management, Discharge Planning, Psychosocial Support  PT interventions Ambulation/gait training, Cognitive remediation/compensation, Discharge planning, DME/adaptive equipment instruction, Functional mobility training, Pain management, Psychosocial support, Splinting/orthotics, Therapeutic Activities, UE/LE Strength taining/ROM, Visual/perceptual remediation/compensation, Wheelchair propulsion/positioning, UE/LE Coordination activities, Therapeutic Exercise, Stair training, Skin care/wound management, Patient/family education, Neuromuscular re-education, Functional electrical stimulation, Disease management/prevention, Academic librarian, Training and development officer  OT Interventions Training and development officer, Discharge planning, Pain management, Self Care/advanced ADL retraining, Therapeutic Activities, UE/LE Coordination activities, Patient/family education, Functional mobility training, Cognitive remediation/compensation, Disease mangement/prevention, Therapeutic Exercise, UE/LE Strength taining/ROM, DME/adaptive equipment instruction, Community reintegration  SLP Interventions Cognitive remediation/compensation, English as a second language teacher, Functional tasks, Patient/family education, Therapeutic Activities, Internal/external aids  TR Interventions    SW/CM Interventions Discharge Planning, Patient/Family Education, Psychosocial Support   Barriers to Discharge MD  Medical stability, Lack of/limited family support, Hemodialysis and Medication compliance  Nursing      PT Inaccessible home environment, Home environment access/layout, Decreased caregiver support, Lack of/limited family support Unsure if family is able to physically assist or not  OT Decreased  caregiver support, Medical stability strength  SLP      SW       Team Discharge Planning: Destination: PT-Home(SNF if family unable to provide physical assistance) ,OT- [place] (SNF) , SLP-Home Projected Follow-up: PT-Home health PT, OT-  Skilled nursing facility, Other (comment)(TBD), SLP-24  hour supervision/assistance, Home Health SLP Projected Equipment Needs: PT-To be determined, OT- To be determined, SLP-None recommended by SLP Equipment Details: PT- , OT-  Patient/family involved in discharge planning: PT- Patient,  OT-Patient, SLP-Patient  MD ELOS: 16-19 days. Medical Rehab Prognosis:  Good  Assessment: 57 year old female (from [place])with T2DM with neuropathy and retinopathy, medical HTN, medical non-compliance,CKDstage IV, [facility] with fluid overload and encephalopathy.She was treated with HD briefly and extensive cardiac, renal and neurologic work up done revealing diffuse CAD, depression, urinary retention due to neurogenic bladder as well as ADEM. She was treated with steroids and discharged to [facility] rehab on 10/07/17. Will likely need HD in the near future per notes. She was admitted to [facility] 10/30/17 with weakness and lethargy, loose stools and leucocytosis. She was started on IV rocephin due to concerns of urosepsis and diarrhea due to C diff colitis. BC and UCS negative but stools positive for C diff. She was started on oral vancomycin for treatment and has had decrease in loose stools. WOC following input on moisture associated skin damageddue to fecal and urinary incontinence. Po intake poor and multiple supplements added to help with malnutrition. She continues to have severe debility and cognitive limitations. Will set goals for Min A for most tasks with PT/OT/SLP.  See Team Conference Notes for weekly updates to the plan of care

## 2017-11-09 ENCOUNTER — Inpatient Hospital Stay (HOSPITAL_COMMUNITY): Payer: No Typology Code available for payment source | Admitting: Occupational Therapy

## 2017-11-09 ENCOUNTER — Inpatient Hospital Stay (HOSPITAL_COMMUNITY): Payer: No Typology Code available for payment source | Admitting: *Deleted

## 2017-11-09 ENCOUNTER — Inpatient Hospital Stay (HOSPITAL_COMMUNITY): Payer: No Typology Code available for payment source | Admitting: Speech Pathology

## 2017-11-09 ENCOUNTER — Inpatient Hospital Stay (HOSPITAL_COMMUNITY): Payer: No Typology Code available for payment source

## 2017-11-09 DIAGNOSIS — E875 Hyperkalemia: Secondary | ICD-10-CM

## 2017-11-09 DIAGNOSIS — E162 Hypoglycemia, unspecified: Secondary | ICD-10-CM

## 2017-11-09 LAB — CBC WITH DIFFERENTIAL/PLATELET
BASOS ABS: 0 10*3/uL (ref 0.0–0.1)
Basophils Relative: 0 %
EOS ABS: 0.2 10*3/uL (ref 0.0–0.7)
EOS PCT: 2 %
HCT: 22.8 % — ABNORMAL LOW (ref 36.0–46.0)
Hemoglobin: 7.4 g/dL — ABNORMAL LOW (ref 12.0–15.0)
LYMPHS ABS: 3.6 10*3/uL (ref 0.7–4.0)
LYMPHS PCT: 34 %
MCH: 27.6 pg (ref 26.0–34.0)
MCHC: 32.5 g/dL (ref 30.0–36.0)
MCV: 85.1 fL (ref 78.0–100.0)
MONO ABS: 0.6 10*3/uL (ref 0.1–1.0)
Monocytes Relative: 5 %
Neutro Abs: 6.3 10*3/uL (ref 1.7–7.7)
Neutrophils Relative %: 59 %
PLATELETS: 482 10*3/uL — AB (ref 150–400)
RBC: 2.68 MIL/uL — ABNORMAL LOW (ref 3.87–5.11)
RDW: 16.9 % — AB (ref 11.5–15.5)
WBC: 10.7 10*3/uL — ABNORMAL HIGH (ref 4.0–10.5)

## 2017-11-09 LAB — OCCULT BLOOD X 1 CARD TO LAB, STOOL: Fecal Occult Bld: POSITIVE — AB

## 2017-11-09 LAB — BASIC METABOLIC PANEL
Anion gap: 10 (ref 5–15)
BUN: 78 mg/dL — AB (ref 6–20)
CO2: 20 mmol/L — ABNORMAL LOW (ref 22–32)
CREATININE: 2.51 mg/dL — AB (ref 0.44–1.00)
Calcium: 8.1 mg/dL — ABNORMAL LOW (ref 8.9–10.3)
Chloride: 109 mmol/L (ref 101–111)
GFR calc Af Amer: 24 mL/min — ABNORMAL LOW (ref >60)
GFR, EST NON AFRICAN AMERICAN: 20 mL/min — AB (ref >60)
GLUCOSE: 99 mg/dL (ref 65–99)
POTASSIUM: 5.3 mmol/L — AB (ref 3.5–5.1)
Sodium: 139 mmol/L (ref 135–145)

## 2017-11-09 LAB — GLUCOSE, CAPILLARY
GLUCOSE-CAPILLARY: 170 mg/dL — AB (ref 65–99)
Glucose-Capillary: 88 mg/dL (ref 65–99)
Glucose-Capillary: 78 mg/dL (ref 65–99)
Glucose-Capillary: 142 mg/dL — ABNORMAL HIGH (ref 65–99)

## 2017-11-09 MED ORDER — RENA-VITE PO TABS
1.0000 | ORAL_TABLET | Freq: Every day | ORAL | Status: DC
  Administered 2017-11-09 – 2017-12-01 (×23): 1 via ORAL
  Filled 2017-11-09 (×23): qty 1

## 2017-11-09 MED ORDER — BISACODYL 10 MG RE SUPP
10.0000 mg | Freq: Every day | RECTAL | Status: DC
  Administered 2017-11-10 – 2017-11-12 (×3): 10 mg via RECTAL
  Filled 2017-11-09 (×3): qty 1

## 2017-11-09 MED ORDER — NEPRO/CARBSTEADY PO LIQD
237.0000 mL | Freq: Three times a day (TID) | ORAL | Status: DC
  Administered 2017-11-09 – 2017-12-01 (×48): 237 mL via ORAL
  Filled 2017-11-09 (×2): qty 237

## 2017-11-09 MED ORDER — METHOCARBAMOL 500 MG PO TABS
500.0000 mg | ORAL_TABLET | Freq: Four times a day (QID) | ORAL | Status: DC | PRN
  Administered 2017-11-09 – 2017-11-11 (×4): 500 mg via ORAL
  Filled 2017-11-09 (×4): qty 1

## 2017-11-09 MED ORDER — SODIUM CHLORIDE 0.9 % IV SOLN
INTRAVENOUS | Status: AC
  Administered 2017-11-09 – 2017-11-11 (×2): via INTRAVENOUS

## 2017-11-09 NOTE — Progress Notes (Signed)
Speech Language Pathology Daily Session Note  Patient Details  Name: Laura [name] MRN: [id number] Date of Birth: 01-12-1961  Today's Date: 11/09/2017 SLP Individual Time: 1035-1100 SLP Individual Time Calculation (min): 25 min  Short Term Goals: Week 1: SLP Short Term Goal 1 (Week 1): Pt will utilize external memory aides to recall new dialy information with Mod A cues.  SLP Short Term Goal 2 (Week 1): Pt will complete basic familiar problem solving task related to ADL with Mod A cues.  SLP Short Term Goal 3 (Week 1): Pt will initiate and respond to task in timely manner with Mod A cues.  SLP Short Term Goal 4 (Week 1): Pt will sustain attention to basic familiar task for ~ 10 minutes with Mod A cues.   Skilled Therapeutic Interventions:  Pt was seen for skilled ST targeting cognitive goals.  Pt utilized a simplified therapy schedule for recall of daily information with min-mod assist verbal and visual cues as pt could not recall activities from yesterday's therapy session.  SLP facilitated the session with a verbal problem solving task to address problem solving and attention goals.  Pt sustained her attention to task for ~5 minutes with min cues for redirection.  Pt needed mod assist verbal cues to identify problems in pictures and generate appropriate solutions.  Pt was left in bed with bed alarm set and call bell within reach.  Continue per current plan of care.    Function:  Eating Eating               Cognition Comprehension Comprehension assist level: Understands basic 75 - 89% of the time/ requires cueing 10 - 24% of the time  Expression   Expression assist level: Expresses basic 90% of the time/requires cueing < 10% of the time.  Social Interaction Social Interaction assist level: Interacts appropriately 75 - 89% of the time - Needs redirection for appropriate language or to initiate interaction.  Problem Solving Problem solving assist level: Solves basic 50 - 74% of the  time/requires cueing 25 - 49% of the time  Memory Memory assist level: Recognizes or recalls 50 - 74% of the time/requires cueing 25 - 49% of the time    Pain Pain Assessment Pain Assessment: No/denies pain  Therapy/Group: Individual Therapy  Grant Henkes, Selinda Orion 11/09/2017, 8:37 PM

## 2017-11-09 NOTE — Progress Notes (Signed)
Occupational Therapy Session Note  Patient Details  Name: Mackenzey Crownover MRN: [id number] Date of Birth: Jun 26, 1961    Patient only seen briefly for chart review and building rapport when staff came in to take patient out for MRI of her back.   She missed 37 minutes of this scheduled therapy session.       Staff will off therapy again tomorrow is all is medically well after MRI results revealed   Therapy Documentation Precautions:  Precautions Precautions: Fall Precaution Comments: monitor blood pressure Restrictions Weight Bearing Restrictions: No General: General OT Amount of Missed Time: 37 Minutes(37)  Therapy/Group: Individual Therapy  Herschell Dimes 11/09/2017, 2:50 PM

## 2017-11-09 NOTE — Progress Notes (Signed)
Recreational Therapy Session Note  Patient Details  Name: Laura [name] MRN: [id number] Date of Birth: 06-08-1961 Today's Date: 11/09/2017  TR eval deferred- pt off the floor for MRI.   [place] 11/09/2017, 5:03 PM

## 2017-11-09 NOTE — Progress Notes (Signed)
Subjective/Complaints: Pt seen lying in bed this AM.  She slept well overnight, but is still very sleepy this AM.   Review of systems: Unable to obtain due to cognition.  Objective: Vital Signs: Blood pressure (!) 150/74, pulse 92, temperature 98.6 F (37 C), temperature source Oral, resp. rate 18, height 4' 11"  (1.499 m), weight 56.7 kg (125 lb), SpO2 99 %. No results found. Results for orders placed or performed during the hospital encounter of 11/05/17 (from the past 72 hour(s))  Glucose, capillary     Status: None   Collection Time: 11/06/17 11:38 AM  Result Value Ref Range   Glucose-Capillary 97 65 - 99 mg/dL  Glucose, capillary     Status: Abnormal   Collection Time: 11/06/17  4:37 PM  Result Value Ref Range   Glucose-Capillary 116 (H) 65 - 99 mg/dL  Glucose, capillary     Status: Abnormal   Collection Time: 11/06/17  9:23 PM  Result Value Ref Range   Glucose-Capillary 123 (H) 65 - 99 mg/dL  Glucose, capillary     Status: Abnormal   Collection Time: 11/07/17  7:06 AM  Result Value Ref Range   Glucose-Capillary 118 (H) 65 - 99 mg/dL   Comment 1 Notify RN   Prepare RBC     Status: None   Collection Time: 11/07/17  9:08 AM  Result Value Ref Range   Order Confirmation      ORDER PROCESSED BY BLOOD BANK Performed at [facility], [place] [address], [place], [place] [address]   Type and screen [place]     Status: None   Collection Time: 11/07/17 11:05 AM  Result Value Ref Range   ABO/RH(D) AB POS    Antibody Screen NEG    Sample Expiration 11/10/2017    Unit Number [id number]    Blood Component Type RBC LR PHER1    Unit division 00    Status of Unit ISSUED,FINAL    Transfusion Status OK TO TRANSFUSE    Crossmatch Result      Compatible Performed at [facility], [place] [address], [place], [place] [address]   ABO/Rh     Status: None   Collection Time: 11/07/17 11:05 AM  Result Value Ref Range   ABO/RH(D)      AB POS Performed at  [facility] [address] [place], [place] [address]   Glucose, capillary     Status: Abnormal   Collection Time: 11/07/17 11:46 AM  Result Value Ref Range   Glucose-Capillary 121 (H) 65 - 99 mg/dL  Glucose, capillary     Status: Abnormal   Collection Time: 11/07/17  4:59 PM  Result Value Ref Range   Glucose-Capillary 114 (H) 65 - 99 mg/dL  Hemoglobin and hematocrit, blood     Status: Abnormal   Collection Time: 11/07/17  6:15 PM  Result Value Ref Range   Hemoglobin 7.7 (L) 12.0 - 15.0 g/dL   HCT 23.8 (L) 36.0 - 46.0 %    Comment: Performed at [facility], [place] [address] [place], [place] [address]  Glucose, capillary     Status: Abnormal   Collection Time: 11/07/17  9:31 PM  Result Value Ref Range   Glucose-Capillary 158 (H) 65 - 99 mg/dL   Comment 1 Notify RN   Glucose, capillary     Status: Abnormal   Collection Time: 11/08/17  6:21 AM  Result Value Ref Range   Glucose-Capillary 150 (H) 65 -  99 mg/dL   Comment 1 Notify RN   Glucose, capillary     Status: Abnormal   Collection Time: 11/08/17 11:50 AM  Result Value Ref Range   Glucose-Capillary 204 (H) 65 - 99 mg/dL  Occult blood card to lab, stool RN will collect     Status: Abnormal   Collection Time: 11/08/17  1:19 PM  Result Value Ref Range   Fecal Occult Bld POSITIVE (A) NEGATIVE    Comment: Performed at [facility] [address], [place], [place] [address]  Glucose, capillary     Status: Abnormal   Collection Time: 11/08/17  5:08 PM  Result Value Ref Range   Glucose-Capillary 44 (LL) 65 - 99 mg/dL  Glucose, capillary     Status: Abnormal   Collection Time: 11/08/17  5:29 PM  Result Value Ref Range   Glucose-Capillary 40 (LL) 65 - 99 mg/dL  Glucose, capillary     Status: None   Collection Time: 11/08/17  6:16 PM  Result Value Ref Range   Glucose-Capillary 77 65 - 99 mg/dL  Glucose, capillary     Status: Abnormal   Collection Time: 11/08/17  9:50 PM  Result Value Ref Range    Glucose-Capillary 141 (H) 65 - 99 mg/dL   Comment 1 Notify RN   Basic metabolic panel     Status: Abnormal   Collection Time: 11/09/17  4:23 AM  Result Value Ref Range   Sodium 139 135 - 145 mmol/L   Potassium 5.3 (H) 3.5 - 5.1 mmol/L   Chloride 109 101 - 111 mmol/L   CO2 20 (L) 22 - 32 mmol/L   Glucose, Bld 99 65 - 99 mg/dL   BUN 78 (H) 6 - 20 mg/dL   Creatinine, Ser 2.51 (H) 0.44 - 1.00 mg/dL   Calcium 8.1 (L) 8.9 - 10.3 mg/dL   GFR calc non Af Amer 20 (L) >60 mL/min   GFR calc Af Amer 24 (L) >60 mL/min    Comment: (NOTE) The eGFR has been calculated using the CKD EPI equation. This calculation has not been validated in all clinical situations. eGFR's persistently <60 mL/min signify possible Chronic Kidney Disease.    Anion gap 10 5 - 15    Comment: Performed at [facility] [address] [place], [place] [address]  CBC with Differential/Platelet     Status: Abnormal   Collection Time: 11/09/17  4:23 AM  Result Value Ref Range   WBC 10.7 (H) 4.0 - 10.5 K/uL   RBC 2.68 (L) 3.87 - 5.11 MIL/uL   Hemoglobin 7.4 (L) 12.0 - 15.0 g/dL   HCT 22.8 (L) 36.0 - 46.0 %   MCV 85.1 78.0 - 100.0 fL   MCH 27.6 26.0 - 34.0 pg   MCHC 32.5 30.0 - 36.0 g/dL   RDW 16.9 (H) 11.5 - 15.5 %   Platelets 482 (H) 150 - 400 K/uL   Neutrophils Relative % 59 %   Neutro Abs 6.3 1.7 - 7.7 K/uL   Lymphocytes Relative 34 %   Lymphs Abs 3.6 0.7 - 4.0 K/uL   Monocytes Relative 5 %   Monocytes Absolute 0.6 0.1 - 1.0 K/uL   Eosinophils Relative 2 %   Eosinophils Absolute 0.2 0.0 - 0.7 K/uL   Basophils Relative 0 %   Basophils Absolute 0.0 0.0 - 0.1 K/uL    Comment: Performed at [facility], [address] [place], [place] [address]  Glucose, capillary  Status: None   Collection Time: 11/09/17  6:51 AM  Result Value Ref Range   Glucose-Capillary 88 65 - 99 mg/dL   Comment 1 Notify RN     General: NAD. Vital signs reviewed. HEENT: Normocephalic, atraumatic.  Cardio: RRR and no  JVD. Resp: CTA B/L and Unlabored GI: BS positive and ND Musc/Skel:  No edmea or tenderness. Neuro: Somnolent Motor:  LUE: 5/5 proximal to distal RUE: 4+/5 proximal to distal (appears to be stable) RLE: HF: 2/5, ADF/PF 2/5 LLE: HF 2/5, ADF/PF 2+/5 Skin:   Intact. Warm and dry.    Assessment/Plan: 1. Functional deficits secondary to debility and encephalopathy which require 3+ hours per day of interdisciplinary therapy in a comprehensive inpatient rehab setting. Physiatrist is providing close team supervision and 24 hour management of active medical problems listed below. Physiatrist and rehab team continue to assess barriers to discharge/monitor patient progress toward functional and medical goals. FIM: Function - Bathing Position: Bed Body parts bathed by patient: Right arm, Left arm, Chest, Abdomen, Front perineal area, Right upper leg, Left upper leg Body parts bathed by helper: Buttocks, Right lower leg, Left lower leg Assist Level: Touching or steadying assistance(Pt > 75%)  Function- Upper Body Dressing/Undressing What is the patient wearing?: Pull over shirt/dress, Bra Bra - Perfomed by patient: Thread/unthread left bra strap Bra - Perfomed by helper: Thread/unthread right bra strap, Hook/unhook bra (pull down sports bra) Pull over shirt/dress - Perfomed by patient: Thread/unthread right sleeve, Thread/unthread left sleeve, Put head through opening, Pull shirt over trunk Assist Level: More than reasonable time, Set up Function - Lower Body Dressing/Undressing What is the patient wearing?: Pants Position: Bed Underwear - Performed by helper: Thread/unthread right underwear leg, Thread/unthread left underwear leg Pants- Performed by helper: Thread/unthread right pants leg, Thread/unthread left pants leg, Pull pants up/down Assist for footwear: Maximal assist Assist for lower body dressing: Touching or steadying assistance (Pt > 75%) Set up : To obtain clothing/put  away  Function - Toileting Toileting activity did not occur: No continent bowel/bladder event Toileting steps completed by helper: Adjust clothing prior to toileting, Performs perineal hygiene, Adjust clothing after toileting(per [place], NT report) Assist level: Touching or steadying assistance (Pt.75%)(per [place] Flirt [place], NT report)  Function - Air cabin crew transfer activity did not occur: Safety/medical concerns Assist level to toilet: 2 helpers(per [place], NT report)  Function - Chair/bed transfer Chair/bed transfer method: Lateral scoot Chair/bed transfer assist level: 2 helpers(per [place], NT report) Chair/bed transfer assistive device: Sliding board(beasy board) Chair/bed transfer details: Manual facilitation for weight bearing, Manual facilitation for weight shifting, Manual facilitation for placement, Verbal cues for technique, Tactile cues for initiation, Tactile cues for placement  Function - Locomotion: Wheelchair Will patient use wheelchair at discharge?: Yes Type: Manual Max wheelchair distance: 100' Assist Level: Touching or steadying assistance (Pt > 75%) Assist Level: Touching or steadying assistance (Pt > 75%) Wheel 150 feet activity did not occur: Safety/medical concerns Turns around,maneuvers to table,bed, and toilet,negotiates 3% grade,maneuvers on rugs and over doorsills: No Function - Locomotion: Ambulation Ambulation activity did not occur: Safety/medical concerns Walk 10 feet activity did not occur: Safety/medical concerns Walk 50 feet with 2 turns activity did not occur: Safety/medical concerns Walk 150 feet activity did not occur: Safety/medical concerns Walk 10 feet on uneven surfaces activity did not occur: Safety/medical concerns  Function - Comprehension Comprehension: Auditory Comprehension assist level: Understands basic 50 - 74% of the time/ requires cueing 25 - 49%  of the time  Function -  Expression Expression: Verbal Expression assist level: Expresses basic 50 - 74% of the time/requires cueing 25 - 49% of the time. Needs to repeat parts of sentences.  Function - Social Interaction Social Interaction assist level: Interacts appropriately 75 - 89% of the time - Needs redirection for appropriate language or to initiate interaction.  Function - Problem Solving Problem solving assist level: Solves basic 50 - 74% of the time/requires cueing 25 - 49% of the time  Function - Memory Memory assist level: Recognizes or recalls 90% of the time/requires cueing < 10% of the time, More than reasonable amount of time Patient normally able to recall (first 3 days only): That he or she is in a hospital, Current season  Medical Problem List and Plan: 1.Functional and mobility deficitssecondary to debility and encephalopathy from multiple medical issues   Cont CIR 2. DVT Prophylaxis/Anticoagulation: Pharmaceutical:Lovenox 3. Pain Management:tylenol prn 4. Mood:LCSW to follow for evaluation and support. 5. Neuropsych: This patientis not fullycapable of making decisions onherown behalf. 6. Skin/Wound Care:Air mattress for pressure relief measures. Local measures to help managesacral /labialMASD PRAFO's for bilateral LE's 7. Fluids/Electrolytes/Nutrition:Monitor I/O.    Continue to offer supplements to help with protein calorie malnutrition.   Cont Remeron 15 8. C diff colitis:    Continue Vancomycin187m po q6 hours initiated 1/27 for 10 doses (through 2/5) Continue enteric precautions Stools are improving 9. HTN: Monitor BP bid.    Continue nifedipine bid   Imdur increased to 90 on 2/4   Metoprolol increased to 100 BID on 2/3   Will consider further increase tomorrow if necesssary Vitals:   11/08/17 2103 11/09/17 0500  BP: (!) 145/70 (!) 150/74  Pulse: 96 92  Resp: 16 18  Temp: 98.8 F (37.1 C) 98.6 F (37 C)  SpO2: 100% 99%   10 T2DM with retinopathy, neuropathy, andnephropathy:Monitor BS ac/hs.    SSI for elevated BS CBG (last 3)  Recent Labs    11/08/17 1816 11/08/17 2150 11/09/17 0651  GLUCAP 77 141* 88    Hypoglycemia yesterday , lantus d/ced 11. H/o depression: Continue Effexor and Remeron.  12. Neurogenic bladder:Has chronic indwelling catheter for 2+ months.   Continue foley due to incontinence and for wound healing, will consider d/cing today after WOC eval 13.AKI onCKD with history of overload:    Cr 2.51 on 2/5   Encourage fluids   Labs ordered for tomorrow, will order IVF qhs x2   Cont to monitor 14. Diffuse CAD: treated medically with Metoprolol, Zetia and Plavix. 15.  ABLA on anemia of chronic disease   Symptomatic anemia, poor PT tolerance dizziness, fatigue   Check stool OB remains pending   Hb 7.4 on 2/5, essentially no benefit after transfusion   Labs ordered for tomorrow 16. Leukocytosis   WBCs 10.7 on 2/5 17. Hypoalbuminemia   Cont supplement 18. Hyperkalemia   K+ 5.3 on 2/5   Labs ordered for tomorrow   Cont to monitor  LOS (Days) 4 A FACE TO FACE EVALUATION WAS PERFORMED  Ankit ALorie Phenix2/02/2018, 9:55 AM

## 2017-11-09 NOTE — Plan of Care (Signed)
Pt requires total assistance

## 2017-11-09 NOTE — Progress Notes (Signed)
Physical Therapy Session Note  Patient Details  Name: Laura [name] MRN: [id number] Date of Birth: 12/08/1960  Today's Date: 11/09/2017 PT Individual Time: 1100-1200 PT Individual Time Calculation (min): 60 min   Short Term Goals: Week 1:  PT Short Term Goal 1 (Week 1): Pt will perform sit<>stand transfer using LRAD, Max assist x1 PT Short Term Goal 2 (Week 1): Pt will tolerate sitting up in w/c in between therapies for 1 hour w/o increase in fatigue PT Short Term Goal 3 (Week 1): Pt will maintain dynamic sitting balance w/ min assist  Skilled Therapeutic Interventions/Progress Updates:    Patient in supine, assisted to roll to side and cues to use rail to come to sit max A for LE's off bed and to lift trunk with cues for hand placement to push up to sit.  Sitting balance minguard to close S while PT donned her shoes, one episode of posterior LOB onto bed max A to recover.  Patient to w/c via scoot pivot max A.  In w/c washed hands at sink with S.  Propelled w/c in hallway 100' min a cues for technique to steer straight ahead.  Patient sit to stand in standing frame x 2 with assist from semi standing to tall standing multiple times for LE strength and balance.  C/o R knee pain in standing.  Assisted in chair back to room.  Attempted to use beasy board for slide board transfer, but could not get under her hip due to contour of w/c cushion, so scoot pivot to bed max A.  To recliner from bed total A due to higher seat cushion.  Unable with +1 assist to get positioned on cushion so RN in to assist with +2 to scoot back and position in recliner for meal.  Left in recliner with chair alarm and QRB with RN in room.   Therapy Documentation Precautions:  Precautions Precautions: Fall Precaution Comments: monitor blood pressure Restrictions Weight Bearing Restrictions: No Pain: Pain Assessment Pain Assessment: 0-10 Pain Score: 2  Pain Type: Chronic pain Pain Location: Knee Pain Orientation:  Right Pain Descriptors / Indicators: Aching Pain Onset: With Activity Pain Intervention(s): Repositioned   See Function Navigator for Current Functional Status.   Therapy/Group: Individual Therapy  Reginia Naas 11/09/2017, 12:21 PM

## 2017-11-09 NOTE — Progress Notes (Signed)
Occupational Therapy Session Note  Patient Details  Name: Laura [name] MRN: [id number] Date of Birth: June 11, 1961  Today's Date: 11/09/2017 OT Individual Time: 0915-1000 OT Individual Time Calculation (min): 45 min  (missed 15 min due to fatigue)   Short Term Goals: Week 1:  OT Short Term Goal 1 (Week 1): Pt will completed UB dressing with minA  OT Short Term Goal 2 (Week 1): Pt will complete LB dressing with modA OT Short Term Goal 3 (Week 1): Pt will perform shower transfer with mod A  OT Short Term Goal 4 (Week 1): Pt will particpiate in OOB activities for 30 mins OT Short Term Goal 5 (Week 1): Pt will complete grooming at sink side with min A   Skilled Therapeutic Interventions/Progress Updates:   Pt received in bed. Pt sleeping and it took considerable time for pt to wake up.  Today she was very difficult to understand, often asking about people that this therapist was not familiar with.  Pt overall very lethargic and not initiating using her arms.  She required more A with UB  Self care of dressing, cues for thorough bathing,  Mod A with rolling. Pt had an incontinent bowel episode.  She was continually falling asleep.  Pt nodded yes that she needed to sleep. Pt's therapy session ended 15 min early. Pt resting in bed with all needs met.     Therapy Documentation Precautions:  Precautions Precautions: Fall Precaution Comments: monitor blood pressure Restrictions Weight Bearing Restrictions: No General: General OT Amount of Missed Time: 15 Minutes   Pain: Pain Assessment Pain Assessment: 0-10 Pain Score: 2  Pain Type: Chronic pain Pain Location: Knee Pain Orientation: Right Pain Descriptors / Indicators: Aching Pain Onset: With Activity Pain Intervention(s): Repositioned ADL:   See Function Navigator for Current Functional Status.   Therapy/Group: Individual Therapy  [place] 11/09/2017, 1:47 PM

## 2017-11-10 ENCOUNTER — Inpatient Hospital Stay (HOSPITAL_COMMUNITY): Payer: No Typology Code available for payment source | Admitting: Occupational Therapy

## 2017-11-10 ENCOUNTER — Inpatient Hospital Stay (HOSPITAL_COMMUNITY): Payer: No Typology Code available for payment source

## 2017-11-10 ENCOUNTER — Inpatient Hospital Stay (HOSPITAL_COMMUNITY): Payer: No Typology Code available for payment source | Admitting: Speech Pathology

## 2017-11-10 ENCOUNTER — Inpatient Hospital Stay (HOSPITAL_COMMUNITY): Payer: No Typology Code available for payment source | Admitting: Physical Therapy

## 2017-11-10 DIAGNOSIS — IMO0002 Reserved for concepts with insufficient information to code with codable children: Secondary | ICD-10-CM | POA: Diagnosis present

## 2017-11-10 DIAGNOSIS — E1149 Type 2 diabetes mellitus with other diabetic neurological complication: Secondary | ICD-10-CM | POA: Diagnosis present

## 2017-11-10 DIAGNOSIS — E1142 Type 2 diabetes mellitus with diabetic polyneuropathy: Secondary | ICD-10-CM

## 2017-11-10 DIAGNOSIS — E1165 Type 2 diabetes mellitus with hyperglycemia: Secondary | ICD-10-CM

## 2017-11-10 DIAGNOSIS — R29898 Other symptoms and signs involving the musculoskeletal system: Secondary | ICD-10-CM

## 2017-11-10 LAB — BASIC METABOLIC PANEL
ANION GAP: 12 (ref 5–15)
BUN: 80 mg/dL — ABNORMAL HIGH (ref 6–20)
CALCIUM: 8.1 mg/dL — AB (ref 8.9–10.3)
CO2: 19 mmol/L — ABNORMAL LOW (ref 22–32)
Chloride: 110 mmol/L (ref 101–111)
Creatinine, Ser: 2.39 mg/dL — ABNORMAL HIGH (ref 0.44–1.00)
GFR calc Af Amer: 25 mL/min — ABNORMAL LOW (ref >60)
GFR, EST NON AFRICAN AMERICAN: 22 mL/min — AB (ref >60)
GLUCOSE: 122 mg/dL — AB (ref 65–99)
POTASSIUM: 5.1 mmol/L (ref 3.5–5.1)
SODIUM: 141 mmol/L (ref 135–145)

## 2017-11-10 LAB — GLUCOSE, CAPILLARY
GLUCOSE-CAPILLARY: 183 mg/dL — AB (ref 65–99)
GLUCOSE-CAPILLARY: 123 mg/dL — AB (ref 65–99)
GLUCOSE-CAPILLARY: 126 mg/dL — AB (ref 65–99)
Glucose-Capillary: 115 mg/dL — ABNORMAL HIGH (ref 65–99)

## 2017-11-10 LAB — CBC WITH DIFFERENTIAL/PLATELET
BASOS ABS: 0.1 10*3/uL (ref 0.0–0.1)
BASOS PCT: 1 %
Eosinophils Absolute: 0.3 10*3/uL (ref 0.0–0.7)
Eosinophils Relative: 3 %
HEMATOCRIT: 23.8 % — AB (ref 36.0–46.0)
Hemoglobin: 7.7 g/dL — ABNORMAL LOW (ref 12.0–15.0)
LYMPHS PCT: 32 %
Lymphs Abs: 3.2 10*3/uL (ref 0.7–4.0)
MCH: 28.1 pg (ref 26.0–34.0)
MCHC: 32.4 g/dL (ref 30.0–36.0)
MCV: 86.9 fL (ref 78.0–100.0)
MONO ABS: 0.5 10*3/uL (ref 0.1–1.0)
Monocytes Relative: 5 %
NEUTROS ABS: 6.2 10*3/uL (ref 1.7–7.7)
Neutrophils Relative %: 59 %
PLATELETS: 472 10*3/uL — AB (ref 150–400)
RBC: 2.74 MIL/uL — AB (ref 3.87–5.11)
RDW: 17.7 % — AB (ref 11.5–15.5)
WBC: 10.2 10*3/uL (ref 4.0–10.5)

## 2017-11-10 MED ORDER — NIFEDIPINE ER 60 MG PO TB24
60.0000 mg | ORAL_TABLET | Freq: Two times a day (BID) | ORAL | Status: DC
  Administered 2017-11-10 – 2017-12-02 (×43): 60 mg via ORAL
  Filled 2017-11-10 (×45): qty 1

## 2017-11-10 MED ORDER — LORAZEPAM 0.5 MG PO TABS
0.5000 mg | ORAL_TABLET | Freq: Once | ORAL | Status: AC
  Administered 2017-11-10: 0.5 mg via ORAL
  Filled 2017-11-10: qty 1

## 2017-11-10 MED ORDER — ACETAMINOPHEN 325 MG PO TABS
650.0000 mg | ORAL_TABLET | Freq: Once | ORAL | Status: AC
  Administered 2017-11-10: 650 mg via ORAL
  Filled 2017-11-10: qty 2

## 2017-11-10 NOTE — Progress Notes (Signed)
Speech Language Pathology Daily Session Note  Patient Details  Name: Laura [name] MRN: [id number] Date of Birth: [birth year]  Today's Date: 11/10/2017 SLP Individual Time: 1505-1530 SLP Individual Time Calculation (min): 25 min  Short Term Goals: Week 1: SLP Short Term Goal 1 (Week 1): Pt will utilize external memory aides to recall new dialy information with Mod A cues.  SLP Short Term Goal 2 (Week 1): Pt will complete basic familiar problem solving task related to ADL with Mod A cues.  SLP Short Term Goal 3 (Week 1): Pt will initiate and respond to task in timely manner with Mod A cues.  SLP Short Term Goal 4 (Week 1): Pt will sustain attention to basic familiar task for ~ 10 minutes with Mod A cues.   Skilled Therapeutic Interventions:  Pt was seen for skilled ST targeting cognitive goals.  Pt was resting in bed upon therapist's arrival with complaints of back pain.  Pt needed mod assist multimodal cues to simulate return demonstration of use of call bell (call bell found to be not functioning, nursing secretary made aware and requested replacement) to request pain meds.  RN made aware.  Pt needed mod-max assist multimodal cues for recall of currently scheduled procedure (MRI).  Pt was left in bed with bed alarm set and phone within reach.  Continue per current plan of care.    Function:  Eating Eating                 Cognition Comprehension Comprehension assist level: Understands basic 75 - 89% of the time/ requires cueing 10 - 24% of the time  Expression   Expression assist level: Expresses basic 75 - 89% of the time/requires cueing 10 - 24% of the time. Needs helper to occlude trach/needs to repeat words.  Social Interaction Social Interaction assist level: Interacts appropriately 75 - 89% of the time - Needs redirection for appropriate language or to initiate interaction.  Problem Solving Problem solving assist level: Solves basic 50 - 74% of the time/requires cueing 25 - 49%  of the time  Memory Memory assist level: Recognizes or recalls 50 - 74% of the time/requires cueing 25 - 49% of the time    Pain Pain Assessment Pain Assessment: Faces Faces Pain Scale: Hurts a little bit Pain Location: Back Pain Descriptors / Indicators: Aching Pain Intervention(s): RN made aware  Therapy/Group: Individual Therapy  Harmoni Lucus, Selinda Orion 11/10/2017, 4:26 PM

## 2017-11-10 NOTE — Progress Notes (Signed)
Occupational Therapy Session Note  Patient Details  Name: Laura [name] MRN: [id number] Date of Birth: 1960/12/14  Today's Date: 11/10/2017 OT Individual Time: 6378-5885 OT Individual Time Calculation (min): 8 min    Short Term Goals: Week 1:  OT Short Term Goal 1 (Week 1): Pt will completed UB dressing with minA  OT Short Term Goal 2 (Week 1): Pt will complete LB dressing with modA OT Short Term Goal 3 (Week 1): Pt will perform shower transfer with mod A  OT Short Term Goal 4 (Week 1): Pt will particpiate in OOB activities for 30 mins OT Short Term Goal 5 (Week 1): Pt will complete grooming at sink side with min A   Skilled Therapeutic Interventions/Progress Updates: clinician waited for patient to return to room following MRI, but upon approach for therapy patient stated she was too fatigued.   A friend was helping patient eat food made by friend's mother.   Rapport building occurred and discussion on safe positioning for patient to sit upright while eating.   However, patient stated her bottom hurts more when she sits upright (pressure into her wound area).  Patient left with supportive friend helping her finish her meal  This clinician not sure of today's MRI results.     Therapy Documentation Precautions:  Precautions Precautions: Fall Precaution Comments: monitor blood pressure Restrictions Weight Bearing Restrictions: No General: General OT Amount of Missed Time: 52 Minutes(52)  Pain:denied   Therapy/Group: Individual Therapy  Alfredia Ferguson [facility] 11/10/2017, 6:25 PM

## 2017-11-10 NOTE — Progress Notes (Addendum)
Physical Therapy Note  Patient Details  Name: Laura [name] MRN: [id number] Date of Birth: [birth year] Today's Date: 11/10/2017    Time: [phone] 54 minutes  1:1 No c/o pain.  Pt found incontinent of bowel.  Rolling in bed with min A for cleaning and lower body dressing (total A).  Supine to sit with min A.  Scoot pivot transfer with total A to w/c.  Max A to scoot back in w/c for improved positioning.  W/c mobility 50' x 2 with min A, manual facilitation for technique, pt improves with repetition of this task.  Standing frame 2 x 5 minutes with tactile cues for upright posture and for glute and trunk control.  Pt performs ball tapping task as she states she enjoys playing volleyball.  Pt only able to tolerate ball tapping task x 2 minutes but remains standing upright with UE support and cues for glute squeezes and wt shifts in standing.  Pt left in room with quick release belt donned, needs at hand.  Time 2: 1325-1353 28 minutes MAKE UP TIME  1:1 No c/o pain.  Pt transferred to bed with squat pivot with total A.  Supine therex 2 x 10 heel slides, SAQ, hip abd/add, LTR, bridging, shoulder flex, shoulder diagonals.  Pt left in bed with needs at hand.   DONAWERTH,KAREN 11/10/2017, 10:25 AM

## 2017-11-10 NOTE — Progress Notes (Addendum)
Occupational Therapy Session Note  Patient Details  Name: Laura [name] MRN: [id number] Date of Birth: [birth year]  Today's Date: 11/10/2017 OT Individual Time: 1115-1200 OT Individual Time Calculation (min): 45 min    Short Term Goals: Week 1:  OT Short Term Goal 1 (Week 1): Pt will completed UB dressing with minA  OT Short Term Goal 2 (Week 1): Pt will complete LB dressing with modA OT Short Term Goal 3 (Week 1): Pt will perform shower transfer with mod A  OT Short Term Goal 4 (Week 1): Pt will particpiate in OOB activities for 30 mins OT Short Term Goal 5 (Week 1): Pt will complete grooming at sink side with min A   Skilled Therapeutic Interventions/Progress Updates:    Pt presents sitting up in w/c agreeable to OT tx session. Pt washes hands at sink with setup assist to reach items prior to exiting room. Transported Pt to therapy gym total assist for energy conservation. Seated in gym pt engages in bil UE AROM for 10x each across all planes; AAROM required for LE movements due to increased LE weakness. Pt completing chair pushups from w/c, though unable to clear buttocks from w/c cushion during completion. Transported Pt back to room; use of bed rails for bil UE support for attempts at sit<>stand; Pt requires max-totalA to clear buttocks from w/c cusion though unable to achieve full standing position this session, further focus on anterior leans and pushing up through UEs/LEs in preparation for clearance of buttocks during functional transfers, completing x5; seated rest breaks throughout. Pt left seated in w/c, QRB donned, RN present to administer meds, need within reach.   Therapy Documentation Precautions:  Precautions Precautions: Fall Precaution Comments: monitor blood pressure Restrictions Weight Bearing Restrictions: No   Pain Assessment Pain Assessment: No/denies pain    See Function Navigator for Current Functional Status.   Therapy/Group: Individual Therapy  Raymondo Band 11/10/2017, 12:37 PM

## 2017-11-10 NOTE — Consult Note (Addendum)
WOC requested to look at sacrum today, see WOC Notes from 11/08/17. No changes in POC, however we could use I&O caths to manage neurogenic bladder rather than foley at this point.   Orders are in the computer for topical wound care.   [place], [place], [place]

## 2017-11-10 NOTE — Progress Notes (Signed)
Subjective/Complaints: Pt seen lying in bed this AM.  She slept well overnight.  She notes improvement in mentation.   Review of systems: Appears to deny CP, SOB, N/V/D.  Objective: Vital Signs: Blood pressure (!) 167/81, pulse 89, temperature 98 F (36.7 C), temperature source Oral, resp. rate 16, height _0  (1.499 m), weight 56.8 kg (125 lb 3.5 oz), SpO2 96 %. No results found. Results for orders placed or performed during the hospital encounter of 11/05/17 (from the past 72 hour(s))  Type and screen [place]     Status: None   Collection Time: 11/07/17 11:05 AM  Result Value Ref Range   ABO/RH(D) AB POS    Antibody Screen NEG    Sample Expiration 11/10/2017    Unit Number [id number]    Blood Component Type RBC LR PHER1    Unit division 00    Status of Unit ISSUED,FINAL    Transfusion Status OK TO TRANSFUSE    Crossmatch Result      Compatible Performed at [facility], [address] [place], [place] [address]   ABO/Rh     Status: None   Collection Time: 11/07/17 11:05 AM  Result Value Ref Range   ABO/RH(D)      AB POS Performed at [facility] [address] [place], [place] [address]   Glucose, capillary     Status: Abnormal   Collection Time: 11/07/17 11:46 AM  Result Value Ref Range   Glucose-Capillary 121 (H) 65 - 99 mg/dL  Glucose, capillary     Status: Abnormal   Collection Time: 11/07/17  4:59 PM  Result Value Ref Range   Glucose-Capillary 114 (H) 65 - 99 mg/dL  Hemoglobin and hematocrit, blood     Status: Abnormal   Collection Time: 11/07/17  6:15 PM  Result Value Ref Range   Hemoglobin 7.7 (L) 12.0 - 15.0 g/dL   HCT 23.8 (L) 36.0 - 46.0 %    Comment: Performed at [facility], [place] [address] [place], [place] [address]  Glucose, capillary     Status: Abnormal   Collection Time: 11/07/17  9:31 PM  Result Value Ref Range   Glucose-Capillary 158 (H) 65 - 99 mg/dL   Comment 1 Notify RN   Glucose, capillary      Status: Abnormal   Collection Time: 11/08/17  6:21 AM  Result Value Ref Range   Glucose-Capillary 150 (H) 65 - 99 mg/dL   Comment 1 Notify RN   Glucose, capillary     Status: Abnormal   Collection Time: 11/08/17 11:50 AM  Result Value Ref Range   Glucose-Capillary 204 (H) 65 - 99 mg/dL  Occult blood card to lab, stool RN will collect     Status: Abnormal   Collection Time: 11/08/17  1:19 PM  Result Value Ref Range   Fecal Occult Bld POSITIVE (A) NEGATIVE    Comment: Performed at [facility], [place] [address] [place], [place] [address]  Glucose, capillary     Status: Abnormal   Collection Time: 11/08/17  5:08 PM  Result Value Ref Range   Glucose-Capillary 44 (LL) 65 - 99 mg/dL  Glucose, capillary     Status: Abnormal   Collection Time: 11/08/17  5:29 PM  Result Value Ref Range   Glucose-Capillary 40 (LL) 65 - 99 mg/dL  Glucose, capillary     Status: None   Collection Time: 11/08/17  6:16 PM  Result Value Ref Range  Glucose-Capillary 77 65 - 99 mg/dL  Glucose, capillary     Status: Abnormal   Collection Time: 11/08/17  9:50 PM  Result Value Ref Range   Glucose-Capillary 141 (H) 65 - 99 mg/dL   Comment 1 Notify RN   Basic metabolic panel     Status: Abnormal   Collection Time: 11/09/17  4:23 AM  Result Value Ref Range   Sodium 139 135 - 145 mmol/L   Potassium 5.3 (H) 3.5 - 5.1 mmol/L   Chloride 109 101 - 111 mmol/L   CO2 20 (L) 22 - 32 mmol/L   Glucose, Bld 99 65 - 99 mg/dL   BUN 78 (H) 6 - 20 mg/dL   Creatinine, Ser 2.51 (H) 0.44 - 1.00 mg/dL   Calcium 8.1 (L) 8.9 - 10.3 mg/dL   GFR calc non Af Amer 20 (L) >60 mL/min   GFR calc Af Amer 24 (L) >60 mL/min    Comment: (NOTE) The eGFR has been calculated using the CKD EPI equation. This calculation has not been validated in all clinical situations. eGFR's persistently <60 mL/min signify possible Chronic Kidney Disease.    Anion gap 10 5 - 15    Comment: Performed at [facility] [address],  [place], [place] [address]  CBC with Differential/Platelet     Status: Abnormal   Collection Time: 11/09/17  4:23 AM  Result Value Ref Range   WBC 10.7 (H) 4.0 - 10.5 K/uL   RBC 2.68 (L) 3.87 - 5.11 MIL/uL   Hemoglobin 7.4 (L) 12.0 - 15.0 g/dL   HCT 22.8 (L) 36.0 - 46.0 %   MCV 85.1 78.0 - 100.0 fL   MCH 27.6 26.0 - 34.0 pg   MCHC 32.5 30.0 - 36.0 g/dL   RDW 16.9 (H) 11.5 - 15.5 %   Platelets 482 (H) 150 - 400 K/uL   Neutrophils Relative % 59 %   Neutro Abs 6.3 1.7 - 7.7 K/uL   Lymphocytes Relative 34 %   Lymphs Abs 3.6 0.7 - 4.0 K/uL   Monocytes Relative 5 %   Monocytes Absolute 0.6 0.1 - 1.0 K/uL   Eosinophils Relative 2 %   Eosinophils Absolute 0.2 0.0 - 0.7 K/uL   Basophils Relative 0 %   Basophils Absolute 0.0 0.0 - 0.1 K/uL    Comment: Performed at [facility], [address] [place], [place] [address]  Glucose, capillary     Status: None   Collection Time: 11/09/17  6:51 AM  Result Value Ref Range   Glucose-Capillary 88 65 - 99 mg/dL   Comment 1 Notify RN   Glucose, capillary     Status: None   Collection Time: 11/09/17 10:04 AM  Result Value Ref Range   Glucose-Capillary 78 65 - 99 mg/dL  Occult blood card to lab, stool RN will collect     Status: Abnormal   Collection Time: 11/09/17 10:30 AM  Result Value Ref Range   Fecal Occult Bld POSITIVE (A) NEGATIVE    Comment: Performed at [facility], [place] [address], [place], [place] [address]  Glucose, capillary     Status: Abnormal   Collection Time: 11/09/17  4:54 PM  Result Value Ref Range   Glucose-Capillary 142 (H) 65 - 99 mg/dL   Comment 1 Notify RN   Glucose, capillary     Status: Abnormal   Collection Time: 11/09/17  8:23 PM  Result Value Ref Range   Glucose-Capillary 170 (H) 65 -  99 mg/dL  Basic metabolic panel     Status: Abnormal   Collection Time: 11/10/17  6:47 AM  Result Value Ref Range   Sodium 141 135 - 145 mmol/L   Potassium 5.1 3.5 - 5.1 mmol/L   Chloride 110 101 - 111 mmol/L   CO2 19  (L) 22 - 32 mmol/L   Glucose, Bld 122 (H) 65 - 99 mg/dL   BUN 80 (H) 6 - 20 mg/dL   Creatinine, Ser 2.39 (H) 0.44 - 1.00 mg/dL   Calcium 8.1 (L) 8.9 - 10.3 mg/dL   GFR calc non Af Amer 22 (L) >60 mL/min   GFR calc Af Amer 25 (L) >60 mL/min    Comment: (NOTE) The eGFR has been calculated using the CKD EPI equation. This calculation has not been validated in all clinical situations. eGFR's persistently <60 mL/min signify possible Chronic Kidney Disease.    Anion gap 12 5 - 15    Comment: Performed at [facility] [address], [place], [place] [address]  CBC with Differential/Platelet     Status: Abnormal   Collection Time: 11/10/17  6:47 AM  Result Value Ref Range   WBC 10.2 4.0 - 10.5 K/uL   RBC 2.74 (L) 3.87 - 5.11 MIL/uL   Hemoglobin 7.7 (L) 12.0 - 15.0 g/dL   HCT 23.8 (L) 36.0 - 46.0 %   MCV 86.9 78.0 - 100.0 fL   MCH 28.1 26.0 - 34.0 pg   MCHC 32.4 30.0 - 36.0 g/dL   RDW 17.7 (H) 11.5 - 15.5 %   Platelets 472 (H) 150 - 400 K/uL   Neutrophils Relative % 59 %   Neutro Abs 6.2 1.7 - 7.7 K/uL   Lymphocytes Relative 32 %   Lymphs Abs 3.2 0.7 - 4.0 K/uL   Monocytes Relative 5 %   Monocytes Absolute 0.5 0.1 - 1.0 K/uL   Eosinophils Relative 3 %   Eosinophils Absolute 0.3 0.0 - 0.7 K/uL   Basophils Relative 1 %   Basophils Absolute 0.1 0.0 - 0.1 K/uL    Comment: Performed at [facility], [address], [place], [place] [address]  Glucose, capillary     Status: Abnormal   Collection Time: 11/10/17  6:54 AM  Result Value Ref Range   Glucose-Capillary 115 (H) 65 - 99 mg/dL    General: NAD. Vital signs reviewed. HEENT: Normocephalic, atraumatic.  Cardio: RRR and no JVD. Resp: CTA B/L and Unlabored GI: BS positive and ND Musc/Skel:  No edmea or tenderness. Neuro: Alert and oriented x3 with increased time. Motor:  LUE: 5/5 proximal to distal RUE: 4+/5 proximal to distal (appears to be stable) RLE: HF: 2/5, ADF/PF 2/5 (unchanged) LLE: HF 2/5, ADF/PF 2+/5  (unchanged) Skin: Intact. Warm and dry.    Assessment/Plan: 1. Functional deficits secondary to debility and encephalopathy which require 3+ hours per day of interdisciplinary therapy in a comprehensive inpatient rehab setting. Physiatrist is providing close team supervision and 24 hour management of active medical problems listed below. Physiatrist and rehab team continue to assess barriers to discharge/monitor patient progress toward functional and medical goals. FIM: Function - Bathing Position: Bed Body parts bathed by patient: Right arm, Left arm, Chest, Abdomen, Front perineal area, Right upper leg, Left upper leg Body parts bathed by helper: Buttocks, Right lower leg, Left lower leg Assist Level: Touching or steadying assistance(Pt > 75%)  Function- Upper Body Dressing/Undressing What is the patient wearing?: Pull over shirt/dress, Bra Bra - Perfomed  by patient: Thread/unthread left bra strap Bra - Perfomed by helper: Thread/unthread right bra strap, Hook/unhook bra (pull down sports bra), Thread/unthread left bra strap Pull over shirt/dress - Perfomed by patient: Thread/unthread right sleeve, Thread/unthread left sleeve, Put head through opening, Pull shirt over trunk Pull over shirt/dress - Perfomed by helper: Thread/unthread right sleeve, Thread/unthread left sleeve, Put head through opening, Pull shirt over trunk Assist Level: More than reasonable time, Set up Function - Lower Body Dressing/Undressing What is the patient wearing?: Pants Position: Bed Underwear - Performed by helper: Thread/unthread right underwear leg, Thread/unthread left underwear leg Pants- Performed by helper: Thread/unthread right pants leg, Thread/unthread left pants leg, Pull pants up/down Assist for footwear: Maximal assist Assist for lower body dressing: Touching or steadying assistance (Pt > 75%) Set up : To obtain clothing/put away  Function - Toileting Toileting activity did not occur: No  continent bowel/bladder event Toileting steps completed by helper: Adjust clothing prior to toileting, Performs perineal hygiene, Adjust clothing after toileting(per [place], NT report) Assist level: Touching or steadying assistance (Pt.75%)(per [place], NT report)  Function - Air cabin crew transfer activity did not occur: Safety/medical concerns Assist level to toilet: 2 helpers(per [place], NT report)  Function - Chair/bed transfer Chair/bed transfer method: Lateral scoot Chair/bed transfer assist level: Total assist (Pt < 25%) Chair/bed transfer assistive device: Sliding board(beasy board) Chair/bed transfer details: Verbal cues for safe use of DME/AE, Verbal cues for precautions/safety, Verbal cues for technique, Manual facilitation for placement, Manual facilitation for weight shifting  Function - Locomotion: Wheelchair Will patient use wheelchair at discharge?: Yes Type: Manual Max wheelchair distance: 100' Assist Level: Touching or steadying assistance (Pt > 75%) Assist Level: Touching or steadying assistance (Pt > 75%) Wheel 150 feet activity did not occur: Safety/medical concerns Turns around,maneuvers to table,bed, and toilet,negotiates 3% grade,maneuvers on rugs and over doorsills: No Function - Locomotion: Ambulation Ambulation activity did not occur: Safety/medical concerns Walk 10 feet activity did not occur: Safety/medical concerns Walk 50 feet with 2 turns activity did not occur: Safety/medical concerns Walk 150 feet activity did not occur: Safety/medical concerns Walk 10 feet on uneven surfaces activity did not occur: Safety/medical concerns  Function - Comprehension Comprehension: Auditory Comprehension assist level: Understands basic 75 - 89% of the time/ requires cueing 10 - 24% of the time  Function - Expression Expression: Verbal Expression assist level: Expresses basic 90% of the time/requires cueing < 10% of the  time.  Function - Social Interaction Social Interaction assist level: Interacts appropriately 75 - 89% of the time - Needs redirection for appropriate language or to initiate interaction.  Function - Problem Solving Problem solving assist level: Solves basic 50 - 74% of the time/requires cueing 25 - 49% of the time  Function - Memory Memory assist level: Recognizes or recalls 50 - 74% of the time/requires cueing 25 - 49% of the time Patient normally able to recall (first 3 days only): That he or she is in a hospital, Current season  Medical Problem List and Plan: 1.Functional and mobility deficitssecondary to debility and encephalopathy from multiple medical issues   Cont CIR   MRI ordered, but later cancelled for workup of LE weakness, will follow up 2. DVT Prophylaxis/Anticoagulation: Pharmaceutical:Lovenox 3. Pain Management:tylenol prn 4. Mood:LCSW to follow for evaluation and support. 5. Neuropsych: This patientis not fullycapable of making decisions onherown behalf. 6. Skin/Wound Care:Air mattress for pressure relief measures. Local measures to help managesacral /labialMASD PRAFO's for bilateral LE's 7.  Fluids/Electrolytes/Nutrition:Monitor I/O.    Continue to offer supplements to help with protein calorie malnutrition.   Cont Remeron 15 8. C diff colitis:    Vancomycinto be completed today Continue enteric precautions Stools are improving 9. HTN: Monitor BP bid.    Nifedipine bid increased to 60 on 2/6   Imdur increased to 90 on 2/4   Metoprolol increased to 100 BID on 2/3 Vitals:   11/09/17 2336 11/10/17 0500  BP: (!) 172/89 (!) 167/81  Pulse: 99 89  Resp:  16  Temp:  98 F (36.7 C)  SpO2:  96%  10 T2DM with retinopathy, neuropathy, andnephropathy:Monitor BS ac/hs.    SSI for elevated BS CBG (last 3)  Recent Labs    11/09/17 1654 11/09/17 2023 11/10/17 0654  GLUCAP 142* 170* 115*    Relatively controlled  on 2/6 11. H/o depression: Continue Effexor and Remeron.  12. Neurogenic bladder:Has chronic indwelling catheter for 2+ months.   Will d/c foley and attempt to manage with I/O caths 13.AKI onCKD with history of overload:    Cr 2.39 on 2/6   Encourage fluids   IVF qhs x2 (1 more)   Cont to monitor 14. Diffuse CAD: treated medically with Metoprolol, Zetia and Plavix. 15.  ABLA on anemia of chronic disease   Symptomatic anemia, poor PT tolerance dizziness, fatigue   Check stool OB remains pending   Hb 7.7 on 2/6 16. Leukocytosis: Resolved   WBCs 10.2 on 2/6 17. Hypoalbuminemia   Cont supplement 18. Hyperkalemia   K+ 5.1 on 2/6   Cont to monitor  LOS (Days) 5 A FACE TO FACE EVALUATION WAS PERFORMED  Ankit Lorie [place] 11/10/2017, 9:52 AM

## 2017-11-11 ENCOUNTER — Inpatient Hospital Stay (HOSPITAL_COMMUNITY): Payer: No Typology Code available for payment source

## 2017-11-11 ENCOUNTER — Inpatient Hospital Stay (HOSPITAL_COMMUNITY): Payer: No Typology Code available for payment source | Admitting: Speech Pathology

## 2017-11-11 ENCOUNTER — Inpatient Hospital Stay (HOSPITAL_COMMUNITY): Payer: No Typology Code available for payment source | Admitting: Physical Therapy

## 2017-11-11 ENCOUNTER — Inpatient Hospital Stay (HOSPITAL_COMMUNITY): Payer: No Typology Code available for payment source | Admitting: Occupational Therapy

## 2017-11-11 DIAGNOSIS — R9389 Abnormal findings on diagnostic imaging of other specified body structures: Secondary | ICD-10-CM | POA: Insufficient documentation

## 2017-11-11 DIAGNOSIS — R195 Other fecal abnormalities: Secondary | ICD-10-CM

## 2017-11-11 DIAGNOSIS — D649 Anemia, unspecified: Secondary | ICD-10-CM

## 2017-11-11 LAB — GLUCOSE, CAPILLARY
GLUCOSE-CAPILLARY: 105 mg/dL — AB (ref 65–99)
GLUCOSE-CAPILLARY: 126 mg/dL — AB (ref 65–99)
Glucose-Capillary: 112 mg/dL — ABNORMAL HIGH (ref 65–99)

## 2017-11-11 MED ORDER — PANTOPRAZOLE SODIUM 40 MG PO TBEC
40.0000 mg | DELAYED_RELEASE_TABLET | Freq: Every day | ORAL | Status: DC
  Administered 2017-11-11 – 2017-11-13 (×3): 40 mg via ORAL
  Filled 2017-11-11 (×3): qty 1

## 2017-11-11 MED ORDER — TRAMADOL HCL 50 MG PO TABS
50.0000 mg | ORAL_TABLET | Freq: Four times a day (QID) | ORAL | Status: DC | PRN
  Administered 2017-11-11 (×2): 50 mg via ORAL
  Filled 2017-11-11 (×2): qty 1

## 2017-11-11 MED ORDER — LORAZEPAM 0.5 MG PO TABS
0.5000 mg | ORAL_TABLET | Freq: Once | ORAL | Status: AC
  Administered 2017-11-11: 0.5 mg via ORAL
  Filled 2017-11-11: qty 1

## 2017-11-11 NOTE — Consult Note (Signed)
[facility] Consult: 2:58 PM 11/11/2017  LOS: 6 days    Referring Provider: Dr. Posey Pronto. Primary Care Physician:  System, Pcp Not In Primary Gastroenterologist: Unassigned patient Laura [name] [phone] cell dtr Hannah [phone] cell    Reason for Consultation: Anemia.  FOBT positive.   HPI: Laura [name] is a 57 y.o. female.  Hx DM type II, historically poorly controlled, insulin requiring.  Hypertension.  Stage IV CKD.  Cognitive impairment.  CKD.  Left retinal detachment.  Cervical spine stenosis Her past history and so far as upper endoscopies and colonoscopies is unknown.  Her daughter does not think she is ever had a colonoscopy or upper endoscopy.  Pt hospitalized for many weeks at [facility] beginning in 08/21/2017 with nephrotic syndrome, fluid overload and encephalopathy.  She required brief hemodialysis treatments but it was felt that she would likely need hemodialysis in the near future.  She required right thoracentesis.  Other problems included diffuse coronary artery disease.  Cardiac catheterization 09/07/17 showed diffuse coronary artery disease, precluding intervention.  Neurogenic bladder and urinary retention, ADEM: acute disseminated encephalomyelitis. CT scan on 08/21/17 showed pericardial effusions, pleural effusion left upper lobe consolidation, anasarca, cardiomegaly, aortic atherosclerosis, cholelithiasis but no acute cholecystitis. She discharged to [facility] rehab center 10/07/17.  At the SNF she developed C. difficile colitis.    She was admitted to [facility] on 1/26 -11/05/17 with severe C. difficile infection, AKI.  Sacral ulcer, deconditioning, bacteriuria, heart failure.  Anemia: Hgb dropped from 10.1 on admission to 7.9 at discharge. Diarrhea and abdominal pain  improved with vancomycin.  She completed 10 day course of oral Vancomycin yesterday. Anorexia is a persistent problem and she is malnourished. She was transferred to [facility] inpatient rehab on 11/05/17.  Her Hgb drifted a bit further down to 7.42 days ago, yesterday it was 7.7.  Stool is FOBT positive. Bedside nurse who is been following her over the last few days tells me that on Sunday the patient passed a fairly large, sausage-shaped stool and right afterwards had a moderate sized clot of blood.  She did not have any further bleeding after that.  She says that her rectal tone is lax.  But on cleaning the patient there is no visible hemorrhoids or tears.    Medications include Plavix, Lovenox.   No PPI or H2 blocker.     Past Medical History:  Diagnosis Date  . CHF (congestive heart failure) ([place])   . Chronic bronchitis ([place])   . High cholesterol   . Hypertension   . Type II diabetes mellitus ([place])     Past Surgical History:  Procedure Laterality Date  . CESAREAN SECTION    . TUBAL LIGATION      Prior to Admission medications   Medication Sig Start Date End Date Taking? Authorizing Provider  acetaminophen (TYLENOL) 325 MG tablet Take 650 mg by mouth every 4 (four) hours as needed for mild pain.    [provider]  aluminum-magnesium hydroxide-simethicone (MAALOX) 793-903-00 MG/5ML SUSP Take 60 mLs by  mouth 4 (four) times daily -  before meals and at bedtime.    [provider]  Amino Acids-Protein Hydrolys (FEEDING SUPPLEMENT, PRO-STAT SUGAR FREE 64,) LIQD Take 30 mLs by mouth 2 (two) times daily. 11/05/17   Colbert Ewing, MD  clopidogrel (PLAVIX) 75 MG tablet Take 75 mg by mouth daily.    [provider]  ezetimibe (ZETIA) 10 MG tablet Take 10 mg by mouth daily.    [provider]  feeding supplement, GLUCERNA SHAKE, (GLUCERNA SHAKE) LIQD Take 237 mLs by mouth 2 (two) times daily between meals. 11/05/17   Colbert Ewing, MD  Hydrocortisone (GERHARDT'S  BUTT CREAM) CREA Apply 1 application topically 4 (four) times daily. 11/05/17   Colbert Ewing, MD  insulin aspart (NOVOLOG) 100 UNIT/ML injection Inject 0-10 Units into the skin See admin instructions. If blood sugar is <70 call MD, 71-250=0, 251-300=2u, 301-350=4,351-400=6u, 401-500=8u, >500=10u and call MD    [provider]  insulin glargine (LANTUS) 100 UNIT/ML injection Inject 5 Units into the skin at bedtime.    [provider]  isosorbide mononitrate (IMDUR) 60 MG 24 hr tablet Take 60 mg by mouth daily.    [provider]  metoprolol tartrate (LOPRESSOR) 50 MG tablet Take 50 mg by mouth 2 (two) times daily.    [provider]  mirtazapine (REMERON) 15 MG tablet Take 15 mg by mouth at bedtime.    [provider]  NIFEdipine (PROCARDIA-XL/ADALAT-CC/NIFEDICAL-XL) 30 MG 24 hr tablet Take 30 mg by mouth 2 (two) times daily.    [provider]  pantoprazole (PROTONIX) 40 MG tablet Take 40 mg by mouth daily.    [provider]  vancomycin (VANCOCIN) 50 mg/mL oral solution Take 2.5 mLs (125 mg total) by mouth every 6 (six) hours. 11/05/17   Colbert Ewing, MD  venlafaxine XR (EFFEXOR-XR) 150 MG 24 hr capsule Take 150 mg by mouth daily with breakfast.    [provider]    Scheduled Meds: . bisacodyl  10 mg Rectal Q0600  . clopidogrel  75 mg Oral Daily  . collagenase   Topical Daily  . enoxaparin (LOVENOX) injection  30 mg Subcutaneous Q24H  . ezetimibe  10 mg Oral Daily  . feeding supplement (NEPRO CARB STEADY)  237 mL Oral TID WC  . feeding supplement (PRO-STAT SUGAR FREE 64)  30 mL Oral BID  . Gerhardt's butt cream   Topical QID  . insulin aspart  0-9 Units Subcutaneous TID WC  . isosorbide mononitrate  90 mg Oral Daily  . metoprolol tartrate  100 mg Oral BID  . mirtazapine  15 mg Oral QHS  . multivitamin  1 tablet Oral QHS  . NIFEdipine  60 mg Oral BID  . nystatin cream   Topical BID  . venlafaxine XR  150 mg Oral Q  breakfast   Infusions:  PRN Meds: acetaminophen, alum & mag hydroxide-simeth, diphenhydrAMINE, guaiFENesin-dextromethorphan, lip balm, methocarbamol, polyethylene glycol, prochlorperazine **OR** prochlorperazine **OR** prochlorperazine, sodium phosphate, traMADol, traZODone   Allergies as of 11/05/2017 - Review Complete 11/05/2017  Allergen Reaction Noted  . Aspirin Hives 10/30/2017  . Statins Other (See Comments) 11/05/2017    Family History  Problem Relation Age of Onset  . Diabetes Father     Social History   Socioeconomic History  . Marital status: Married    Spouse name: Not on file  . Number of children: Not on file  . Years of education: Not on file  . Highest education level: Not on  file  Social Needs  . Financial resource strain: Not on file  . Food insecurity - worry: Not on file  . Food insecurity - inability: Not on file  . Transportation needs - medical: Not on file  . Transportation needs - non-medical: Not on file  Occupational History  . Not on file  Tobacco Use  . Smoking status: Never Smoker  . Smokeless tobacco: Never Used  Substance and Sexual Activity  . Alcohol use: No    Frequency: Never  . Drug use: No  . Sexual activity: No  Other Topics Concern  . Not on file  Social History Narrative  . Not on file    REVIEW OF SYSTEMS: Constitutional: Weakness. ENT:  No nose bleeds Pulm: No trouble breathing.  No cough. CV:  No palpitations, no LE edema.  GU:  No hematuria, no frequency GI: Denies abdominal pain and nausea. Heme: Nothing in the chart mentions a tendency to unusual bleeding or bruising. Transfusions: None Neuro:  No headaches, no peripheral tingling or numbness Derm:  No itching, no rash or sores.  Endocrine:  No sweats or chills.  No polyuria or dysuria Immunization: No records of any immunizations. Travel:  None beyond local counties in last few months.    PHYSICAL EXAM: Vital signs in last 24 hours: Vitals:   11/11/17  0512 11/11/17 1300  BP: (!) 170/84 (!) 113/59  Pulse: 81 66  Resp: 18 18  Temp: 98.8 F (37.1 C) 98.1 F (36.7 C)  SpO2: 94% 96%   Wt Readings from Last 3 Encounters:  11/11/17 56.9 kg (125 lb 7.1 oz)  11/05/17 54.9 kg (121 lb)    General: Pale, chronically ill-appearing Filipino woman Head: No facial asymmetry or swelling. Eyes: No scleral icterus.  No conjunctival pallor Ears: Not hard of hearing Nose: No congestion or discharge Mouth:  No blood, no sores. Neck: No mass, no JVD.  No thyromegaly Lungs: Clear bilaterally.  No labored breathing or cough. Heart: RRR.  No MRG.  S1, S2 present. Abdomen: Soft.  Not tender or distended.  Active bowel sounds.  No masses, no HSM, no hernias or bruits..   Rectal: Patient was sitting in a wheelchair alone in the room and it was impossible to get her into a position where I could perform a rectal exam.  I spoke to the nurse who has been cleaning up her rectum who described poor rectal tone but no visible clots, ulcers or hemorrhoids. Musc/Skeltl: Muscle wasting more prominent in the legs. Extremities: Lower extremity edema/anasarca.  Pitting is about 1+ in the feet. Neurologic: She is alert.  She follows commands.  She told me she was at [facility].  She was able to tell me the year.  Her grip strength is full as is her foot extension.  No tremors. Skin: Pale.  No telangiectasia. Tattoos: None seen Nodes: No cervical adenopathy. Psych: Calm, flat affect.  Intake/Output from previous day: 02/06 0701 - 02/07 0700 In: 600 [P.O.:600] Out: 950 [Urine:950] Intake/Output this shift: Total I/O In: -  Out: 275 [Urine:275]  LAB RESULTS: Recent Labs    11/09/17 0423 11/10/17 0647  WBC 10.7* 10.2  HGB 7.4* 7.7*  HCT 22.8* 23.8*  PLT 482* 472*   BMET Lab Results  Component Value Date   NA 141 11/10/2017   NA 139 11/09/2017   NA 142 11/06/2017   K 5.1 11/10/2017   K 5.3 (H) 11/09/2017   K 5.1 11/06/2017   CL 110  11/10/2017   CL 109 11/09/2017   CL 112 (H) 11/06/2017   CO2 19 (L) 11/10/2017   CO2 20 (L) 11/09/2017   CO2 21 (L) 11/06/2017   GLUCOSE 122 (H) 11/10/2017   GLUCOSE 99 11/09/2017   GLUCOSE 95 11/06/2017   BUN 80 (H) 11/10/2017   BUN 78 (H) 11/09/2017   BUN 64 (H) 11/06/2017   CREATININE 2.39 (H) 11/10/2017   CREATININE 2.51 (H) 11/09/2017   CREATININE 2.26 (H) 11/06/2017   CALCIUM 8.1 (L) 11/10/2017   CALCIUM 8.1 (L) 11/09/2017   CALCIUM 8.0 (L) 11/06/2017   LFT No results for input(s): PROT, ALBUMIN, AST, ALT, ALKPHOS, BILITOT, BILIDIR, IBILI in the last 72 hours. PT/INR No results found for: INR, PROTIME Hepatitis Panel No results for input(s): HEPBSAG, HCVAB, HEPAIGM, HEPBIGM in the last 72 hours. C-Diff No components found for: CDIFF Lipase     Component Value Date/Time   LIPASE 17 10/30/2017 0858    Drugs of Abuse  No results found for: LABOPIA, COCAINSCRNUR, LABBENZ, AMPHETMU, THCU, LABBARB   RADIOLOGY STUDIES: Mr Cervical Spine Wo Contrast  Result Date: 11/10/2017 CLINICAL DATA:  57 y/o F; patient presenting with lower extremity weakness. History of diabetes, neuropathy, retinopathy, hypertension, chronic kidney disease. EXAM: MRI CERVICAL SPINE WITHOUT CONTRAST TECHNIQUE: Multiplanar, multisequence MR imaging of the cervical spine was performed. No intravenous contrast was administered. COMPARISON:  None. FINDINGS: Alignment: Straightening of cervical lordosis.  No listhesis. Vertebrae: No fracture, evidence of discitis, or bone lesion. Cord: No abnormal cord signal. Posterior Fossa, vertebral arteries, paraspinal tissues: Extensive sinus disease within the visible maxillary and sphenoid sinuses. Disc levels: C2-3: No significant disc displacement, foraminal stenosis, or canal stenosis. Mild facet hypertrophy. C3-4: Disc osteophyte complex with bilateral uncovertebral and facet hypertrophy. Mild foraminal and canal stenosis. C4-5: Disc osteophyte complex with bilateral  uncovertebral and facet hypertrophy. Mild foraminal and canal stenosis. C5-6: Disc osteophyte complex with left-greater-than-right bilateral uncovertebral and facet hypertrophy. Mild left foraminal stenosis. No canal stenosis. C6-7: No significant disc displacement, foraminal stenosis, or canal stenosis. C7-T1: No significant disc displacement, foraminal stenosis, or canal stenosis. IMPRESSION: 1. No acute osseous abnormality or abnormal cord signal. 2. Mild cervical spondylosis greatest at the C3-4 and C4-5 levels. 3. Mild multifactorial C3-4 and C4-5 canal stenosis. No high-grade canal stenosis. 4. Mild bilateral C3-4, bilateral C4-5, and left C5-6 foraminal stenosis. No high-grade foraminal stenosis. 5. Extensive sinus disease within visible maxillary and sphenoid sinuses. Electronically Signed   By: Kristine Garbe M.D.   On: 11/10/2017 17:29   Mr Lumbar Spine Wo Contrast  Result Date: 11/10/2017 CLINICAL DATA:  57 y/o F; evaluation of lower extremity weakness. History of diabetes, neuropathy, retinopathy, hypertension, and chronic kidney disease. EXAM: MRI LUMBAR SPINE WITHOUT CONTRAST TECHNIQUE: Multiplanar, multisequence MR imaging of the lumbar spine was performed. No intravenous contrast was administered. COMPARISON:  Concurrent cervical spine MRI. FINDINGS: Motion degraded study. Segmentation:  Standard. Alignment:  Physiologic. Vertebrae:  No fracture, evidence of discitis, or bone lesion. Conus medullaris and cauda equina: Conus extends to the L1 level. On the sagittal T2 weighted sequence there is increased signal within the conus medullaris without appreciable expansion. Paraspinal and other soft tissues: Negative. Disc levels: L1-2: No significant disc displacement, foraminal stenosis, or canal stenosis. L2-3: No significant disc displacement, foraminal stenosis, or canal stenosis. L3-4: Small disc bulge. No significant foraminal or canal stenosis. L4-5: Small disc bulge and mild facet  hypertrophy. Mild bilateral foraminal and canal stenosis. L5-S1: No significant disc displacement, foraminal  stenosis, or canal stenosis. IMPRESSION: 1. Increased signal within the conus medullaris on sagittal sequences may represent myelitis or edema from lower cord lesion, incompletely visualized. Thoracic spine MRI is recommended to further evaluate. 2. Mild lumbar spondylosis with predominantly discogenic degenerative changes at the L3-4 and L4-5 levels. 3. Mild L4-5 foraminal and canal stenosis.  No high-grade stenosis. These results will be called to the ordering clinician or representative by the Radiologist Assistant, and communication documented in the PACS or zVision Dashboard. Electronically Signed   By: Kristine Garbe M.D.   On: 11/10/2017 17:40     IMPRESSION:   *   Progressive anemia, normocytic.  FOBT +.   Blood PR on 2/3, not recurrent.   Probably has never had EGD or colonoscopy Anemia is multifactorial in patient with CKD, multiple medical problems with continuous hospital stay for 2 months followed by SNF then readmission and now Rehab.  Just completed a 10-day course of oral vancomycin for C. difficile colitis, diarrhea and abdominal pain have resolved.  *   Conditioning, malnutrition, poorly controlled insulin requiring type 2 DM, acute disseminated encephalomyelitis, recent nephrotic syndrome, CAD, CHF.  *   Chronic Plavix.   PLAN:     *   Add Protonix 40 mg once daily.  Continue using the Dulcolax suppository daily as the rectal bleeding could have been from constipation causing a stercoral ulcer.  *   Will discuss the case with Dr. Havery Moros but she is probably going to need upper endoscopy and colonoscopy in the next few days, these are not urgent and could wait until early next week.     Azucena Freed  11/11/2017, 2:58 PM Pager: [phone]

## 2017-11-11 NOTE — Progress Notes (Signed)
Social Work Patient ID: [name], female   DOB: 07/29/1961, 57 y.o.   MRN: [id number]   Able to reach pt's spouse today so have reviewed team conference with both pt and spouse.  Both aware of targeted d/c date of 2/23 with min assist w/c level goals.  Pt remains very flat and simply nods "yes".  Spouse expressing much concern about being able to manage pt's care at home, however, does report he has arranged for one of pt's friends to assist when he is at work.  Spouse talks about pt's lengthy hospitalization and notes, "She walked into [facility] in November and now look where we are."  He questions if her LOS on CIR could be extended so I addressed reasoning/ justifications for extensions but stressed that he should not expect this nor should he expect that need for 24/7 physical assist would change.  I asked that he speak with possible caregiver to confirm her ability to provide physical assist and he will do so.  Not sure if insurance will consider coverage of SNF again if family cannot meet care needs - alerted spouse to this concern as well.  He is hopeful she can go directly home, however, his preference is "...for her to stay there with you guys as long as she can."    Davarion Cuffee, LCSW

## 2017-11-11 NOTE — Progress Notes (Signed)
Physical Therapy Note  Patient Details  Name: Laura [name] MRN: [id number] Date of Birth: 1961-06-14 Today's Date: 11/11/2017    Time: 1030-1126 56 minutes  1:1 Pt c/o pain in "behind", RN made aware, repositioned.  Pt performs rolling with mod A with total A for lower body dressing. Supine to sit with max A.  Sitting edge of bed with max A to maintain balance, pt limited by worsening pain in bottom with sitting.  Pt requires max A for sitting balance and total A to don shirt and shoes.  Total A transfer to w/c.  Pt then fitted for TIS w/c to relieve pressure on bottom.  Total A transfer to TIS, pt states she does feel "better" in TIS.  Kinetron 20 steps x 3 with min A.  UE therex with 1# dowel with pt requiring min visual and verbal cuing to follow.  Pt left in TIS with quick release belt donned, needs at hand.   Selisa Tensley 11/11/2017, 11:28 AM

## 2017-11-11 NOTE — Progress Notes (Signed)
Speech Language Pathology Daily Session Note  Patient Details  Name: Laura [name] MRN: [id number] Date of Birth: Dec 31, 1960  Today's Date: 11/11/2017 SLP Individual Time: 0900-1000 SLP Individual Time Calculation (min): 60 min  Short Term Goals: Week 1: SLP Short Term Goal 1 (Week 1): Pt will utilize external memory aides to recall new dialy information with Mod A cues.  SLP Short Term Goal 2 (Week 1): Pt will complete basic familiar problem solving task related to ADL with Mod A cues.  SLP Short Term Goal 3 (Week 1): Pt will initiate and respond to task in timely manner with Mod A cues.  SLP Short Term Goal 4 (Week 1): Pt will sustain attention to basic familiar task for ~ 10 minutes with Mod A cues.   Skilled Therapeutic Interventions:  Pt was seen for skilled ST targeting cognitive goals.  Pt was asleep upon arrival but awakened easily to voice and light touch.  Breakfast was untouched upon therapist's arrival and pt indicated that she wanted to eat.  Pt needed mod assist verbal cues for initiation and sequencing of tray set up due to decreased sustained attention to task.  Pt demonstrated improved automaticity with brushing teeth, washing face, and applying chapstick following set up for obtaining items and supervision for task initiation, sequencing, and organization.  SLP facilitated the session with a novel card game targeting sustained attention to task.  Pt needed min verbal cues for redirection to task after ~3 minute intervals.  Pt with complaints of back pain upon therapist's departure, requesting pain meds.  RN made aware and pt repositioned for comfort.  Left in bed with bed alarm set and call bell within reach.  Continue per current plan of care.    Function:  Eating Eating                 Cognition Comprehension Comprehension assist level: Understands basic 90% of the time/cues < 10% of the time  Expression   Expression assist level: Expresses basic 75 - 89% of the  time/requires cueing 10 - 24% of the time. Needs helper to occlude trach/needs to repeat words.  Social Interaction Social Interaction assist level: Interacts appropriately 75 - 89% of the time - Needs redirection for appropriate language or to initiate interaction.  Problem Solving Problem solving assist level: Solves basic 50 - 74% of the time/requires cueing 25 - 49% of the time  Memory Memory assist level: Recognizes or recalls 50 - 74% of the time/requires cueing 25 - 49% of the time    Pain Pain Assessment Pain Assessment: 0-10 Pain Score: 8  Pain Type: Acute pain Pain Location: Back Pain Orientation: Lower Pain Descriptors / Indicators: Aching Pain Intervention(s): RN made aware  Therapy/Group: Individual Therapy  Juliani Laduke, Selinda Orion 11/11/2017, 12:46 PM

## 2017-11-11 NOTE — Patient Care Conference (Signed)
Inpatient RehabilitationTeam Conference and Plan of Care Update Date: 11/10/2017   Time: 2:55 PM    Patient Name: Laura [name]      Medical Record Number: [id number]  Date of Birth: 06-22-1961 Sex: Female         Room/Bed: 4M12C/4M12C-01 Payor Info: Payor: [employer] / Plan: [place] / Product Type: *No Product type* /    Admitting Diagnosis: [place] Debility  Admit Date/Time:  11/05/2017  5:50 PM Admission Comments: No comment available   Primary Diagnosis:  <principal problem not specified> Principal Problem: <principal problem not specified>  Patient Active Problem List   Diagnosis Date Noted  . Abnormal MRI   . Uncontrolled type 2 diabetes mellitus with hyperglycemia ([place])   . Diabetic peripheral neuropathy ([place])   . Weakness of both lower extremities   . Hyperkalemia   . Hypoglycemia   . Type 2 diabetes mellitus with complication, with long-term current use of insulin ([place])   . Neurogenic bladder   . Hypoalbuminemia due to protein-calorie malnutrition ([place])   . AKI (acute kidney injury) ([place])   . Stage 3 chronic kidney disease ([place])   . Physical debility 11/05/2017  . Critical illness myopathy   . Encephalopathy   . Diabetes mellitus type 2 in nonobese (H[facility])   . Hyperlipidemia   . Cognitive impairment   . Benign essential HTN   . Chronic diastolic congestive heart failure ([place])   . Chronic kidney disease (CKD), stage IV (severe) ([place])   . Acute blood loss anemia   . Anemia of chronic disease   . Leukocytosis   . Enteritis due to Clostridium difficile   . Urinary tract infection 10/30/2017  . Pressure injury of skin 10/30/2017    Expected Discharge Date: Expected Discharge Date: 11/27/17  Team Members Present: Physician leading conference: Dr. Delice Lesch Social Worker Present: Lennart Pall, LCSW Nurse Present: Junius Creamer, RN PT Present: Roderic Ovens, PT OT Present: Willeen Cass, OT SLP Present: Windell Moulding, SLP PPS Coordinator present :  Daiva Nakayama, RN, CRRN     Current Status/Progress Goal Weekly Team Focus  Medical   Functional and mobility deficits secondary to debility and encephalopathy from multiple medical issues  Improve mobility, safety, BP, C dif, AKI, neurgenic bladder, hyperkalemia  See above   Bowel/Bladder   foley catheter pt incontinent of bowel LBM 11/09/17  less incidents of incontinence mod assist  removal of foley catheter  offer toileting for bowels q2h and prn assess continued need for foley catheter   Swallow/Nutrition/ Hydration             ADL's   max - total A overall  min A overall  postural control, activity tolerance, strengthening   Mobility   Max/total A  Min A transfers, supervision w/c  transfers, activity tolerance, strength   Communication             Safety/Cognition/ Behavioral Observations  mod-max assist   supervision   basic cognition    Pain   pain controlled with tylenol  robaxin for spasms  pt will have pain that is <=3 free of muscle spasms  assess pain q4h and prn assess for effectiveness of analgesics and muscle relaxers, notify MD if no relief    Skin   pt has MASD to buttock/groin unstageable wound on sacrum    improved skin integrity no further skin breakdown  continue santyl treatment to sacrum skin assessment qs     Rehab Goals Patient on target to meet rehab  goals: Yes *See Care Plan and progress notes for long and short-term goals.     Barriers to Discharge  Current Status/Progress Possible Resolutions Date Resolved   Physician    Medical stability;Decreased caregiver support;Incontinence;Neurogenic Bowel & Bladder;Wound Care;Lack of/limited family support;Other (comments)  LE weakness  See above  Therapies, optimize BP meds, abx for C dif, follow labs, voiding trial      Nursing                  PT                    OT                  SLP                SW Lack of/limited family support(still to confirm assist available with spouse)               Discharge Planning/Teaching Needs:  Still trying to connect with spouse to confirm how much assist available from family.  Anticipate need for 24/7 physical assistance but have left messages for spouse to contact ASAP but no word as of this first team conf.  Teaching will need to be done with whoever is identified caregivers.   Team Discussion:  Pt with BP issues;  Po VANC to stop today;  Foley d/c'd today and dig-stim for bowels.  Stage 2 on sacrum - WOC following.  Currently total assist with transfers.  Working in standing frame.  Max assist with cognition.  Pt lethargic.  Goals being set for min assist transfers and w/c level.  SW still trying to reach spouse to confirm d/c plan.  Revisions to Treatment Plan:  None    Continued Need for Acute Rehabilitation Level of Care: The patient requires daily medical management by a physician with specialized training in physical medicine and rehabilitation for the following conditions: Daily direction of a multidisciplinary physical rehabilitation program to ensure safe treatment while eliciting the highest outcome that is of practical value to the patient.: Yes Daily medical management of patient stability for increased activity during participation in an intensive rehabilitation regime.: Yes Daily analysis of laboratory values and/or radiology reports with any subsequent need for medication adjustment of medical intervention for : Diabetes problems;Wound care problems;Blood pressure problems;Urological problems;Renal problems;Other  Laura [name] 11/11/2017, 11:20 AM

## 2017-11-11 NOTE — Consult Note (Signed)
NEURO HOSPITALIST CONSULT NOTE   Requestig physician: Dr. Posey Pronto   Reason for Consult: Tetraplegia   History obtained from:  Patient   Chart   HPI:                                                                                                                                          Laura [name] is an 57 y.o. female (from [place])with T2DM with neuropathy and retinopathy, medical HTN, medical non-compliance,CKDstage IV, [facility] with fluid overload and encephalopathy.She was treated with HD briefly and extensive cardiac, renal and neurologic work up done revealing diffuse CAD, depression, urinary retention due to neurogenic bladder as well as ADEM. She was treated with steroids and discharged to [facility] rehab on 10/07/17. Will likely need HD in the near future per notes. She was admitted to [facility] 10/30/17 with weakness and lethargy, loose stools and leucocytosis. She was started on IV rocephin due to concerns of urosepsis but and diarrhea due to C diff colitis. BC and UCS negative but stools positive for C diff. She was started on oral vancomycin for treatment and has had decrease in loose stools. WOC following input on moisture associated skin damageddue to fecal and urinary incontinence. Po intake poor and multiple supplements added to help with malnutrition. She continued to have diffuse weakness query ciritcal illness myopathy as well as sever debility. CIR recommended due to functional deficits.  "On initial exam in CIR she was noted to have bilateral tingling in her feet and weakness. assist to elevate trunk to upright, light assist/facilitation to scoot to EOB in sitting, Sit to Stand: Total assist,  partial stand/squat pivot bed<>chair with PT blocking knees as pt is too weak to WB through LEs and has poor trunk control, disoriented and slow to process on initial exam.   Per PT--"Tetraplegia with muscle  wasting and bilateral foot drop. UE minus proximal to 3/5 distally bilaterally. Lower extremity she is 1+/5 hip flexion, 2-/5 knee extension 3-/5 plantar flexion and trace ankle dorsiflexion bilaterally. She has decreased sensation in both feet in a stocking glove distribution."  "Most recent exam, LUE: 5/5 proximal to distal, RUE: 4+/5 proximal to distal (appears to be stable), RLE: HF: 2/5, ADF/PF 2/5 (stable), LLE: HF 2/5, ADF/PF 2+/5 (stable)."  Lumbar MRI revealed "Increased signal within the conus medullaris on sagittal sequences may represent myelitis or edema from lower cord lesion, incompletely visualized.   MRI cervical spine showed no acute abnormality.   MRI brain in [facility] showed: Unusual pattern of acute to subacute restricted diffusion in the brain, including multifocal involvement of the corpus callosum, and pronounced symmetric involvement of the bilateral cerebellar peduncles. Those lesions are superimposed on more conventional / ischemic appearing diffusion abnormality in the bilateral corona  radiata, the left thalamus and left pons. Splenium corpus callosum abnormality has been described in the setting of renal failure and metabolic disturbances. Therefore, the differential etiologies in this case include: Renal failure, metabolic disturbances, demyelination, and an unusual pattern of ischemia.  Per friend who she states she was diagnosed with diabetes a year ago but refused to take medication and one year later she she had lost significant weight--her PCP stated she was severely ill due to not taking her medication and Her diabetes was severely out of control.     Neurology Annice Needy from [facility] did a full work up: Lumbar puncture performed showed WBC 2, RBC 2, Prot 85, and Gluc 75. Culture/Gram Stain Negative. CMV, EBV, HSV Neg. The elevation of the CSF protein is nonspecific but may indicate a degree of inflammation. MS Profile is returned but not available for  review (being scanned into medical record as of 12/31).   ANA Neg,  SS-A/SS-B Neg,  RF Neg,  Lupus Inhibitor Neg,  RPR Neg,  Heavy Metal Screen Neg,  TPO Neg,  CRP WNL.  Serum paraneoplastic panel [facility]) is negative.    At that time her Ddx: Weakness/Encephalopathy most likely 2/2 osmotic demyelination in the setting of Nephrotic Syndrome 2/2 diabetic nephropathy      All information was gathered by chart and friend as patient is not a good historian.    Past Medical History:  Diagnosis Date  . CHF (congestive heart failure) ([place])   . Chronic bronchitis ([place])   . High cholesterol   . Hypertension   . Type II diabetes mellitus ([place])     Past Surgical History:  Procedure Laterality Date  . CESAREAN SECTION    . TUBAL LIGATION      Family History  Problem Relation Age of Onset  . Diabetes Father      Social History:  reports that  has never smoked. she has never used smokeless tobacco. She reports that she does not drink alcohol or use drugs.  Allergies  Allergen Reactions  . Aspirin Hives  . Statins Other (See Comments)    Joint pain    MEDICATIONS:                                                                                                                     Prior to Admission:  Medications Prior to Admission  Medication Sig Dispense Refill Last Dose  . acetaminophen (TYLENOL) 325 MG tablet Take 650 mg by mouth every 4 (four) hours as needed for mild pain.   10/29/2017 at Unknown time  . aluminum-magnesium hydroxide-simethicone (MAALOX) [id number] MG/5ML SUSP Take 60 mLs by mouth 4 (four) times daily -  before meals and at bedtime.   unknown  . Amino Acids-Protein Hydrolys (FEEDING SUPPLEMENT, PRO-STAT SUGAR FREE 64,) LIQD Take 30 mLs by mouth 2 (two) times daily. 12 Bottle 4   . clopidogrel (PLAVIX) 75 MG tablet Take 75 mg by mouth daily.   10/29/2017 at Unknown time  .  ezetimibe (ZETIA) 10 MG tablet Take 10 mg by mouth daily.   10/29/2017 at Unknown  time  . feeding supplement, GLUCERNA SHAKE, (GLUCERNA SHAKE) LIQD Take 237 mLs by mouth 2 (two) times daily between meals. 60 Can 1   . Hydrocortisone (GERHARDT'S BUTT CREAM) CREA Apply 1 application topically 4 (four) times daily. 10 each 6   . insulin aspart (NOVOLOG) 100 UNIT/ML injection Inject 0-10 Units into the skin See admin instructions. If blood sugar is <70 call MD, 71-250=0, 251-300=2u, 301-350=4,351-400=6u, 401-500=8u, >500=10u and call MD   10/29/2017 at Unknown time  . insulin glargine (LANTUS) 100 UNIT/ML injection Inject 5 Units into the skin at bedtime.   10/29/2017 at Unknown time  . isosorbide mononitrate (IMDUR) 60 MG 24 hr tablet Take 60 mg by mouth daily.   10/29/2017 at Unknown time  . metoprolol tartrate (LOPRESSOR) 50 MG tablet Take 50 mg by mouth 2 (two) times daily.   10/29/2017 at 2148  . mirtazapine (REMERON) 15 MG tablet Take 15 mg by mouth at bedtime.   10/29/2017 at Unknown time  . NIFEdipine (PROCARDIA-XL/ADALAT-CC/NIFEDICAL-XL) 30 MG 24 hr tablet Take 30 mg by mouth 2 (two) times daily.   10/29/2017 at Unknown time  . pantoprazole (PROTONIX) 40 MG tablet Take 40 mg by mouth daily.   10/29/2017 at Unknown time  . vancomycin (VANCOCIN) 50 mg/mL oral solution Take 2.5 mLs (125 mg total) by mouth every 6 (six) hours. 50 mL 0   . venlafaxine XR (EFFEXOR-XR) 150 MG 24 hr capsule Take 150 mg by mouth daily with breakfast.   10/29/2017 at Unknown time   Scheduled: . bisacodyl  10 mg Rectal Q0600  . clopidogrel  75 mg Oral Daily  . collagenase   Topical Daily  . enoxaparin (LOVENOX) injection  30 mg Subcutaneous Q24H  . ezetimibe  10 mg Oral Daily  . feeding supplement (NEPRO CARB STEADY)  237 mL Oral TID WC  . feeding supplement (PRO-STAT SUGAR FREE 64)  30 mL Oral BID  . Gerhardt's butt cream   Topical QID  . insulin aspart  0-9 Units Subcutaneous TID WC  . isosorbide mononitrate  90 mg Oral Daily  . metoprolol tartrate  100 mg Oral BID  . mirtazapine  15 mg Oral QHS  .  multivitamin  1 tablet Oral QHS  . NIFEdipine  60 mg Oral BID  . nystatin cream   Topical BID  . venlafaxine XR  150 mg Oral Q breakfast     ROS:                                                                                                                                       History obtained from the patient  General ROS: negative for - chills, fatigue, fever, night sweats, weight gain or weight loss Psychological ROS: negative for - behavioral disorder, hallucinations, memory difficulties, mood swings or suicidal ideation  Ophthalmic ROS: negative for - blurry vision, double vision, eye pain or loss of vision ENT ROS: negative for - epistaxis, nasal discharge, oral lesions, sore throat, tinnitus or vertigo Allergy and Immunology ROS: negative for - hives or itchy/watery eyes Hematological and Lymphatic ROS: negative for - bleeding problems, bruising or swollen lymph nodes Endocrine ROS: negative for - galactorrhea, hair pattern changes, polydipsia/polyuria or temperature intolerance Respiratory ROS: negative for - cough, hemoptysis, shortness of breath or wheezing Cardiovascular ROS: negative for - chest pain, dyspnea on exertion, edema or irregular heartbeat Gastrointestinal ROS: negative for - abdominal pain, diarrhea, hematemesis, nausea/vomiting or stool incontinence Genito-Urinary ROS: positive for -  incontinence or urinary and fecal  Musculoskeletal ROS: positive for -  muscular weakness Neurological ROS: as noted in HPI Dermatological ROS: positive dry flaky skin   Blood pressure (!) 170/84, pulse 81, temperature 98.8 F (37.1 C), temperature source Oral, resp. rate 18, height 4\' 11"  (1.499 m), weight 56.9 kg (125 lb 7.1 oz), SpO2 94 %.   General Examination:                                                                                                       Physical Exam  HEENT-  Normocephalic, no lesions, without obvious abnormality.  Normal external eye and  conjunctiva.   Cardiovascular- S1-S2 audible, pulses palpable throughout   Lungs-no rhonchi or wheezing noted, no excessive working breathing.  Saturations within normal limits Abdomen- All 4 quadrants palpated and nontender Extremities- Warm, dry and intact Musculoskeletal-no joint tenderness, deformity or swelling Skin-warm and dry, no hyperpigmentation, vitiligo, or suspicious lesions  Neurological Examination Mental Status: Alert, oriented to month but not year or place, thought content appropriate.  Speech fluent without evidence of aphasia.  Able to follow 3 step commands without difficulty. Cranial Nerves: II:  Visual fields grossly normal,  III,IV, VI: ptosis not present, extra-ocular motions intact bilaterally pupils equal, round, reactive to light and accommodation V,VII: smile symmetric, facial light touch sensation normal bilaterally VIII: hearing normal bilaterally IX,X: uvula rises symmetrically XI: bilateral shoulder shrug XII: midline tongue extension Motor: Right : Upper extremity    Left:     Upper extremity 4/5 deltoid       4/5 deltoid 5/5 tricep      3/5 tricep 4/5 biceps      4/5 biceps  5/5wrist flexion     5/5 wrist flexion 5/5 wrist extension     5/5 wrist extension 5/5 hand grip      5/5 hand grip  Lower extremity     Lower extremity 1/5 hip flexor      1/5 hip flexor 2/5 hip adductors     2/5 hip adductors 2/5 hip abductors     2/5 hip abductors 1/5 quadricep      1/5 quadriceps  1/5 hamstrings     1/5 hamstrings 3/5 plantar flexion       3/5 plantar flexion 0/5 plantar extension     3/5 plantar extension  Sensory: Pinprick and light touch intact throughout, bilaterally UE ut in her LE  she has no proprioception bilaterally, vibration at her ankle and temperature is impaired up to knee.  Deep Tendon Reflexes: 0 and symmetric throughout Plantars: Mute bilaterally Cerebellar: dysmetric finger-to-nose due to strength  Gait: not tested   Lab  Results: Basic Metabolic Panel: Recent Labs  Lab 11/05/17 0551 11/06/17 0441 11/09/17 0423 11/10/17 0647  NA 142 142 139 141  K 4.8 5.1 5.3* 5.1  CL 113* 112* 109 110  CO2 21* 21* 20* 19*  GLUCOSE 81 95 99 122*  BUN 52* 64* 78* 80*  CREATININE 2.05* 2.26* 2.51* 2.39*  CALCIUM 8.3* 8.0* 8.1* 8.1*    CBC: Recent Labs  Lab 11/05/17 0551 11/06/17 0441 11/07/17 1815 11/09/17 0423 11/10/17 0647  WBC 12.9* 12.3*  --  10.7* 10.2  NEUTROABS  --  7.4  --  6.3 6.2  HGB 7.9* 7.5* 7.7* 7.4* 7.7*  HCT 24.4* 23.4* 23.8* 22.8* 23.8*  MCV 85.0 85.4  --  85.1 86.9  PLT 527* 523*  --  482* 472*    Cardiac Enzymes: No results for input(s): CKTOTAL, CKMB, CKMBINDEX, TROPONINI in the last 168 hours.  Lipid Panel: No results for input(s): CHOL, TRIG, HDL, CHOLHDL, VLDL, LDLCALC in the last 168 hours.  Imaging: Mr Cervical Spine Wo Contrast  Result Date: 11/10/2017 CLINICAL DATA:  57 y/o F; patient presenting with lower extremity weakness. History of diabetes, neuropathy, retinopathy, hypertension, chronic kidney disease. EXAM: MRI CERVICAL SPINE WITHOUT CONTRAST TECHNIQUE: Multiplanar, multisequence MR imaging of the cervical spine was performed. No intravenous contrast was administered. COMPARISON:  None. FINDINGS: Alignment: Straightening of cervical lordosis.  No listhesis. Vertebrae: No fracture, evidence of discitis, or bone lesion. Cord: No abnormal cord signal. Posterior Fossa, vertebral arteries, paraspinal tissues: Extensive sinus disease within the visible maxillary and sphenoid sinuses. Disc levels: C2-3: No significant disc displacement, foraminal stenosis, or canal stenosis. Mild facet hypertrophy. C3-4: Disc osteophyte complex with bilateral uncovertebral and facet hypertrophy. Mild foraminal and canal stenosis. C4-5: Disc osteophyte complex with bilateral uncovertebral and facet hypertrophy. Mild foraminal and canal stenosis. C5-6: Disc osteophyte complex with  left-greater-than-right bilateral uncovertebral and facet hypertrophy. Mild left foraminal stenosis. No canal stenosis. C6-7: No significant disc displacement, foraminal stenosis, or canal stenosis. C7-T1: No significant disc displacement, foraminal stenosis, or canal stenosis. IMPRESSION: 1. No acute osseous abnormality or abnormal cord signal. 2. Mild cervical spondylosis greatest at the C3-4 and C4-5 levels. 3. Mild multifactorial C3-4 and C4-5 canal stenosis. No high-grade canal stenosis. 4. Mild bilateral C3-4, bilateral C4-5, and left C5-6 foraminal stenosis. No high-grade foraminal stenosis. 5. Extensive sinus disease within visible maxillary and sphenoid sinuses. Electronically Signed   By: Kristine Garbe M.D.   On: 11/10/2017 17:29   Mr Lumbar Spine Wo Contrast  Result Date: 11/10/2017 CLINICAL DATA:  57 y/o F; evaluation of lower extremity weakness. History of diabetes, neuropathy, retinopathy, hypertension, and chronic kidney disease. EXAM: MRI LUMBAR SPINE WITHOUT CONTRAST TECHNIQUE: Multiplanar, multisequence MR imaging of the lumbar spine was performed. No intravenous contrast was administered. COMPARISON:  Concurrent cervical spine MRI. FINDINGS: Motion degraded study. Segmentation:  Standard. Alignment:  Physiologic. Vertebrae:  No fracture, evidence of discitis, or bone lesion. Conus medullaris and cauda equina: Conus extends to the L1 level. On the sagittal T2 weighted sequence there is increased signal within the conus medullaris without appreciable expansion. Paraspinal and other soft tissues: Negative. Disc levels: L1-2: No significant disc displacement, foraminal stenosis, or canal stenosis. L2-3: No significant disc displacement, foraminal stenosis, or canal stenosis. L3-4:  Small disc bulge. No significant foraminal or canal stenosis. L4-5: Small disc bulge and mild facet hypertrophy. Mild bilateral foraminal and canal stenosis. L5-S1: No significant disc displacement, foraminal  stenosis, or canal stenosis. IMPRESSION: 1. Increased signal within the conus medullaris on sagittal sequences may represent myelitis or edema from lower cord lesion, incompletely visualized. Thoracic spine MRI is recommended to further evaluate. 2. Mild lumbar spondylosis with predominantly discogenic degenerative changes at the L3-4 and L4-5 levels. 3. Mild L4-5 foraminal and canal stenosis.  No high-grade stenosis. These results will be called to the ordering clinician or representative by the Radiologist Assistant, and communication documented in the PACS or zVision Dashboard. Electronically Signed   By: Kristine Garbe M.D.   On: 11/10/2017 17:40    Assessment and plan per attending neurologist  Etta Quill PA-C [facility] [phone]  11/11/2017, 11:26 AM  I have seen the patient and reviewed the above note.  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  I am actually not certain of the T2 change seen in the conus on her lumbar spine MRI.  Assessment/Plan: 57 year old female with severe T2 change/restricted diffusion in the white matter including middle cerebellar peduncle in the setting of severe nephrotic syndrome, diabetes, weight loss.  Marchiafava-Bignami disease would be another consideration especially given the degree of her corpus callosum involvement.  It is also possible that there may be a component of demyelinating neuropathy given her decreased lower extremity reflexes and weakness which is seen with nephrotic syndrome as well.  Her autoimmune workup was negative and I think that this is less likely culprit.  I think the mainstay of treatment at this point is going to be supportive.  1) continue vitamin supplementation 2) repeat MRI brain,  T-spine 3) continue therapies.  Roland Rack, MD [facility] [phone]  If 7pm- 7am, please page neurology on call as listed in [place].

## 2017-11-11 NOTE — H&P (View-Only) (Signed)
[facility] Consult: 2:58 PM 11/11/2017  LOS: 6 days    Referring Provider: Dr. Posey Pronto. Primary Care Physician:  System, Pcp Not In Primary Gastroenterologist: Unassigned patient Laura [name] [phone] cell dtr Hannah 336 [phone] cell    Reason for Consultation: Anemia.  FOBT positive.   HPI: Laura [name] is a 57 y.o. female.  Hx DM type II, historically poorly controlled, insulin requiring.  Hypertension.  Stage IV CKD.  Cognitive impairment.  CKD.  Left retinal detachment.  Cervical spine stenosis Her past history and so far as upper endoscopies and colonoscopies is unknown.  Her daughter does not think she is ever had a colonoscopy or upper endoscopy.  Pt hospitalized for many weeks at [facility] beginning in 08/21/2017 with nephrotic syndrome, fluid overload and encephalopathy.  She required brief hemodialysis treatments but it was felt that she would likely need hemodialysis in the near future.  She required right thoracentesis.  Other problems included diffuse coronary artery disease.  Cardiac catheterization 09/07/17 showed diffuse coronary artery disease, precluding intervention.  Neurogenic bladder and urinary retention, ADEM: acute disseminated encephalomyelitis. CT scan on 08/21/17 showed pericardial effusions, pleural effusion left upper lobe consolidation, anasarca, cardiomegaly, aortic atherosclerosis, cholelithiasis but no acute cholecystitis. She discharged to [facility] place rehab center 10/07/17.  At the SNF she developed C. difficile colitis.    She was admitted to [facility] on 1/26 -11/05/17 with severe C. difficile infection, AKI.  Sacral ulcer, deconditioning, bacteriuria, heart failure.  Anemia: Hgb dropped from 10.1 on admission to 7.9 at discharge. Diarrhea and abdominal pain  improved with vancomycin.  She completed 10 day course of oral Vancomycin yesterday. Anorexia is a persistent problem and she is malnourished. She was transferred to [facility] inpatient rehab on 11/05/17.  Her Hgb drifted a bit further down to 7.42 days ago, yesterday it was 7.7.  Stool is FOBT positive. Bedside nurse who is been following her over the last few days tells me that on Sunday the patient passed a fairly large, sausage-shaped stool and right afterwards had a moderate sized clot of blood.  She did not have any further bleeding after that.  She says that her rectal tone is lax.  But on cleaning the patient there is no visible hemorrhoids or tears.    Medications include Plavix, Lovenox.   No PPI or H2 blocker.     Past Medical History:  Diagnosis Date  . CHF (congestive heart failure) ([place])   . Chronic bronchitis ([place])   . High cholesterol   . Hypertension   . Type II diabetes mellitus ([place])     Past Surgical History:  Procedure Laterality Date  . CESAREAN SECTION    . TUBAL LIGATION      Prior to Admission medications   Medication Sig Start Date End Date Taking? Authorizing Provider  acetaminophen (TYLENOL) 325 MG tablet Take 650 mg by mouth every 4 (four) hours as needed for mild pain.    [provider]  aluminum-magnesium hydroxide-simethicone (MAALOX) [phone] MG/5ML SUSP Take 60 mLs by  mouth 4 (four) times daily -  before meals and at bedtime.    [provider]  Amino Acids-Protein Hydrolys (FEEDING SUPPLEMENT, PRO-STAT SUGAR FREE 64,) LIQD Take 30 mLs by mouth 2 (two) times daily. 11/05/17   Colbert Ewing, MD  clopidogrel (PLAVIX) 75 MG tablet Take 75 mg by mouth daily.    [provider]  ezetimibe (ZETIA) 10 MG tablet Take 10 mg by mouth daily.    [provider]  feeding supplement, GLUCERNA SHAKE, (GLUCERNA SHAKE) LIQD Take 237 mLs by mouth 2 (two) times daily between meals. 11/05/17   Colbert Ewing, MD  Hydrocortisone (GERHARDT'S  BUTT CREAM) CREA Apply 1 application topically 4 (four) times daily. 11/05/17   Colbert Ewing, MD  insulin aspart (NOVOLOG) 100 UNIT/ML injection Inject 0-10 Units into the skin See admin instructions. If blood sugar is <70 call MD, 71-250=0, 251-300=2u, 301-350=4,351-400=6u, 401-500=8u, >500=10u and call MD    [provider]  insulin glargine (LANTUS) 100 UNIT/ML injection Inject 5 Units into the skin at bedtime.    [provider]  isosorbide mononitrate (IMDUR) 60 MG 24 hr tablet Take 60 mg by mouth daily.    [provider]  metoprolol tartrate (LOPRESSOR) 50 MG tablet Take 50 mg by mouth 2 (two) times daily.    [provider]  mirtazapine (REMERON) 15 MG tablet Take 15 mg by mouth at bedtime.    [provider]  NIFEdipine (PROCARDIA-XL/ADALAT-CC/NIFEDICAL-XL) 30 MG 24 hr tablet Take 30 mg by mouth 2 (two) times daily.    [provider]  pantoprazole (PROTONIX) 40 MG tablet Take 40 mg by mouth daily.    [provider]  vancomycin (VANCOCIN) 50 mg/mL oral solution Take 2.5 mLs (125 mg total) by mouth every 6 (six) hours. 11/05/17   Colbert Ewing, MD  venlafaxine XR (EFFEXOR-XR) 150 MG 24 hr capsule Take 150 mg by mouth daily with breakfast.    [provider]    Scheduled Meds: . bisacodyl  10 mg Rectal Q0600  . clopidogrel  75 mg Oral Daily  . collagenase   Topical Daily  . enoxaparin (LOVENOX) injection  30 mg Subcutaneous Q24H  . ezetimibe  10 mg Oral Daily  . feeding supplement (NEPRO CARB STEADY)  237 mL Oral TID WC  . feeding supplement (PRO-STAT SUGAR FREE 64)  30 mL Oral BID  . Gerhardt's butt cream   Topical QID  . insulin aspart  0-9 Units Subcutaneous TID WC  . isosorbide mononitrate  90 mg Oral Daily  . metoprolol tartrate  100 mg Oral BID  . mirtazapine  15 mg Oral QHS  . multivitamin  1 tablet Oral QHS  . NIFEdipine  60 mg Oral BID  . nystatin cream   Topical BID  . venlafaxine XR  150 mg Oral Q  breakfast   Infusions:  PRN Meds: acetaminophen, alum & mag hydroxide-simeth, diphenhydrAMINE, guaiFENesin-dextromethorphan, lip balm, methocarbamol, polyethylene glycol, prochlorperazine **OR** prochlorperazine **OR** prochlorperazine, sodium phosphate, traMADol, traZODone   Allergies as of 11/05/2017 - Review Complete 11/05/2017  Allergen Reaction Noted  . Aspirin Hives 10/30/2017  . Statins Other (See Comments) 11/05/2017    Family History  Problem Relation Age of Onset  . Diabetes Father     Social History   Socioeconomic History  . Marital status: Married    Spouse name: Not on file  . Number of children: Not on file  . Years of education: Not on file  . Highest education level: Not on  file  Social Needs  . Financial resource strain: Not on file  . Food insecurity - worry: Not on file  . Food insecurity - inability: Not on file  . Transportation needs - medical: Not on file  . Transportation needs - non-medical: Not on file  Occupational History  . Not on file  Tobacco Use  . Smoking status: Never Smoker  . Smokeless tobacco: Never Used  Substance and Sexual Activity  . Alcohol use: No    Frequency: Never  . Drug use: No  . Sexual activity: No  Other Topics Concern  . Not on file  Social History Narrative  . Not on file    REVIEW OF SYSTEMS: Constitutional: Weakness. ENT:  No nose bleeds Pulm: No trouble breathing.  No cough. CV:  No palpitations, no LE edema.  GU:  No hematuria, no frequency GI: Denies abdominal pain and nausea. Heme: Nothing in the chart mentions a tendency to unusual bleeding or bruising. Transfusions: None Neuro:  No headaches, no peripheral tingling or numbness Derm:  No itching, no rash or sores.  Endocrine:  No sweats or chills.  No polyuria or dysuria Immunization: No records of any immunizations. Travel:  None beyond local counties in last few months.    PHYSICAL EXAM: Vital signs in last 24 hours: Vitals:   11/11/17  0512 11/11/17 1300  BP: (!) 170/84 (!) 113/59  Pulse: 81 66  Resp: 18 18  Temp: 98.8 F (37.1 C) 98.1 F (36.7 C)  SpO2: 94% 96%   Wt Readings from Last 3 Encounters:  11/11/17 56.9 kg (125 lb 7.1 oz)  11/05/17 54.9 kg (121 lb)    General: Pale, chronically ill-appearing Filipino woman Head: No facial asymmetry or swelling. Eyes: No scleral icterus.  No conjunctival pallor Ears: Not hard of hearing Nose: No congestion or discharge Mouth:  No blood, no sores. Neck: No mass, no JVD.  No thyromegaly Lungs: Clear bilaterally.  No labored breathing or cough. Heart: RRR.  No MRG.  S1, S2 present. Abdomen: Soft.  Not tender or distended.  Active bowel sounds.  No masses, no HSM, no hernias or bruits..   Rectal: Patient was sitting in a wheelchair alone in the room and it was impossible to get her into a position where I could perform a rectal exam.  I spoke to the nurse who has been cleaning up her rectum who described poor rectal tone but no visible clots, ulcers or hemorrhoids. Musc/Skeltl: Muscle wasting more prominent in the legs. Extremities: Lower extremity edema/anasarca.  Pitting is about 1+ in the feet. Neurologic: She is alert.  She follows commands.  She told me she was at [facility].  She was able to tell me the year.  Her grip strength is full as is her foot extension.  No tremors. Skin: Pale.  No telangiectasia. Tattoos: None seen Nodes: No cervical adenopathy. Psych: Calm, flat affect.  Intake/Output from previous day: 02/06 0701 - 02/07 0700 In: 600 [P.O.:600] Out: 950 [Urine:950] Intake/Output this shift: Total I/O In: -  Out: 275 [Urine:275]  LAB RESULTS: Recent Labs    11/09/17 0423 11/10/17 0647  WBC 10.7* 10.2  HGB 7.4* 7.7*  HCT 22.8* 23.8*  PLT 482* 472*   BMET Lab Results  Component Value Date   NA 141 11/10/2017   NA 139 11/09/2017   NA 142 11/06/2017   K 5.1 11/10/2017   K 5.3 (H) 11/09/2017   K 5.1 11/06/2017   CL 110  11/10/2017   CL 109 11/09/2017   CL 112 (H) 11/06/2017   CO2 19 (L) 11/10/2017   CO2 20 (L) 11/09/2017   CO2 21 (L) 11/06/2017   GLUCOSE 122 (H) 11/10/2017   GLUCOSE 99 11/09/2017   GLUCOSE 95 11/06/2017   BUN 80 (H) 11/10/2017   BUN 78 (H) 11/09/2017   BUN 64 (H) 11/06/2017   CREATININE 2.39 (H) 11/10/2017   CREATININE 2.51 (H) 11/09/2017   CREATININE 2.26 (H) 11/06/2017   CALCIUM 8.1 (L) 11/10/2017   CALCIUM 8.1 (L) 11/09/2017   CALCIUM 8.0 (L) 11/06/2017   LFT No results for input(s): PROT, ALBUMIN, AST, ALT, ALKPHOS, BILITOT, BILIDIR, IBILI in the last 72 hours. PT/INR No results found for: INR, PROTIME Hepatitis Panel No results for input(s): HEPBSAG, HCVAB, HEPAIGM, HEPBIGM in the last 72 hours. C-Diff No components found for: CDIFF Lipase     Component Value Date/Time   LIPASE 17 10/30/2017 0858    Drugs of Abuse  No results found for: LABOPIA, COCAINSCRNUR, LABBENZ, AMPHETMU, THCU, LABBARB   RADIOLOGY STUDIES: Mr Cervical Spine Wo Contrast  Result Date: 11/10/2017 CLINICAL DATA:  57 y/o F; patient presenting with lower extremity weakness. History of diabetes, neuropathy, retinopathy, hypertension, chronic kidney disease. EXAM: MRI CERVICAL SPINE WITHOUT CONTRAST TECHNIQUE: Multiplanar, multisequence MR imaging of the cervical spine was performed. No intravenous contrast was administered. COMPARISON:  None. FINDINGS: Alignment: Straightening of cervical lordosis.  No listhesis. Vertebrae: No fracture, evidence of discitis, or bone lesion. Cord: No abnormal cord signal. Posterior Fossa, vertebral arteries, paraspinal tissues: Extensive sinus disease within the visible maxillary and sphenoid sinuses. Disc levels: C2-3: No significant disc displacement, foraminal stenosis, or canal stenosis. Mild facet hypertrophy. C3-4: Disc osteophyte complex with bilateral uncovertebral and facet hypertrophy. Mild foraminal and canal stenosis. C4-5: Disc osteophyte complex with bilateral  uncovertebral and facet hypertrophy. Mild foraminal and canal stenosis. C5-6: Disc osteophyte complex with left-greater-than-right bilateral uncovertebral and facet hypertrophy. Mild left foraminal stenosis. No canal stenosis. C6-7: No significant disc displacement, foraminal stenosis, or canal stenosis. C7-T1: No significant disc displacement, foraminal stenosis, or canal stenosis. IMPRESSION: 1. No acute osseous abnormality or abnormal cord signal. 2. Mild cervical spondylosis greatest at the C3-4 and C4-5 levels. 3. Mild multifactorial C3-4 and C4-5 canal stenosis. No high-grade canal stenosis. 4. Mild bilateral C3-4, bilateral C4-5, and left C5-6 foraminal stenosis. No high-grade foraminal stenosis. 5. Extensive sinus disease within visible maxillary and sphenoid sinuses. Electronically Signed   By: Kristine Garbe M.D.   On: 11/10/2017 17:29   Mr Lumbar Spine Wo Contrast  Result Date: 11/10/2017 CLINICAL DATA:  57 y/o F; evaluation of lower extremity weakness. History of diabetes, neuropathy, retinopathy, hypertension, and chronic kidney disease. EXAM: MRI LUMBAR SPINE WITHOUT CONTRAST TECHNIQUE: Multiplanar, multisequence MR imaging of the lumbar spine was performed. No intravenous contrast was administered. COMPARISON:  Concurrent cervical spine MRI. FINDINGS: Motion degraded study. Segmentation:  Standard. Alignment:  Physiologic. Vertebrae:  No fracture, evidence of discitis, or bone lesion. Conus medullaris and cauda equina: Conus extends to the L1 level. On the sagittal T2 weighted sequence there is increased signal within the conus medullaris without appreciable expansion. Paraspinal and other soft tissues: Negative. Disc levels: L1-2: No significant disc displacement, foraminal stenosis, or canal stenosis. L2-3: No significant disc displacement, foraminal stenosis, or canal stenosis. L3-4: Small disc bulge. No significant foraminal or canal stenosis. L4-5: Small disc bulge and mild facet  hypertrophy. Mild bilateral foraminal and canal stenosis. L5-S1: No significant disc displacement, foraminal  stenosis, or canal stenosis. IMPRESSION: 1. Increased signal within the conus medullaris on sagittal sequences may represent myelitis or edema from lower cord lesion, incompletely visualized. Thoracic spine MRI is recommended to further evaluate. 2. Mild lumbar spondylosis with predominantly discogenic degenerative changes at the L3-4 and L4-5 levels. 3. Mild L4-5 foraminal and canal stenosis.  No high-grade stenosis. These results will be called to the ordering clinician or representative by the Radiologist Assistant, and communication documented in the PACS or zVision Dashboard. Electronically Signed   By: Kristine Garbe M.D.   On: 11/10/2017 17:40     IMPRESSION:   *   Progressive anemia, normocytic.  FOBT +.   Blood PR on 2/3, not recurrent.   Probably has never had EGD or colonoscopy Anemia is multifactorial in patient with CKD, multiple medical problems with continuous hospital stay for 2 months followed by SNF then readmission and now Rehab.  Just completed a 10-day course of oral vancomycin for C. difficile colitis, diarrhea and abdominal pain have resolved.  *   Conditioning, malnutrition, poorly controlled insulin requiring type 2 DM, acute disseminated encephalomyelitis, recent nephrotic syndrome, CAD, CHF.  *   Chronic Plavix.   PLAN:     *   Add Protonix 40 mg once daily.  Continue using the Dulcolax suppository daily as the rectal bleeding could have been from constipation causing a stercoral ulcer.  *   Will discuss the case with Dr. Havery Moros but she is probably going to need upper endoscopy and colonoscopy in the next few days, these are not urgent and could wait until early next week.     Azucena Freed  11/11/2017, 2:58 PM Pager: [phone]

## 2017-11-11 NOTE — Progress Notes (Signed)
Physical Therapy Session Note  Patient Details  Name: Laura [name] MRN: [id number] Date of Birth: [birth year]  Today's Date: 11/11/2017 PT Individual Time: 1610-1650 PT Individual Time Calculation (min): 40 min   Short Term Goals: Week 1:  PT Short Term Goal 1 (Week 1): Pt will perform sit<>stand transfer using LRAD, Max assist x1 PT Short Term Goal 2 (Week 1): Pt will tolerate sitting up in w/c in between therapies for 1 hour w/o increase in fatigue PT Short Term Goal 3 (Week 1): Pt will maintain dynamic sitting balance w/ min assist  Skilled Therapeutic Interventions/Progress Updates:   Pt in TIS and agreeable to therapy, no c/o pain this session. Focused on tolerance to upright and OOB activity this session. Performed kinetron w/ max manual assist to facilitation active muscle contraction in LEs and functional strength. Max verbal cues to engage as pt very sleepy. Returned to room and transferred to EOB w/ total assist x2 lateral scoot. Maintained static sitting balance for 10+ minutes at EOB w/o UE support and close supervision. Pt w/ improved tolerance to sitting w/o back support this session, she reports the wound on her bottom feels better than earlier today. Transferred to supine w/ mod assist and max assist to reposition in bed for comfort and ability to sit up right and eat dinner. Ended session in supine, call bell within reach and all needs met.   Therapy Documentation Precautions:  Precautions Precautions: Fall Precaution Comments: monitor blood pressure Restrictions Weight Bearing Restrictions: No Vital Signs: Therapy Vitals Temp: 98.1 F (36.7 C) Temp Source: Oral Pulse Rate: 66 Resp: 18 BP: (!) 113/59 Patient Position (if appropriate): Sitting Oxygen Therapy SpO2: 96 % O2 Device: Not Delivered Pain: Pain Assessment Pain Assessment: 0-10 Pain Score: 8  Pain Location: Back Pain Orientation: Mid;Lower Patients Stated Pain Goal: 4 Pain Intervention(s):  Medication (See eMAR)  See Function Navigator for Current Functional Status.   Therapy/Group: Individual Therapy  Kato Wieczorek K Arnette 11/11/2017, 4:48 PM

## 2017-11-11 NOTE — Progress Notes (Signed)
Initial Nutrition Assessment  DOCUMENTATION CODES:   Not applicable  INTERVENTION:  Continue Nepro Shake po TID, each supplement provides 425 kcal and 19 grams protein.  Continue 30 ml Prostat po BID, each supplement provides 100 kcal and 15 grams of protein.   Food choices discussed.   Encouraged adequate PO intake.   NUTRITION DIAGNOSIS:   Increased nutrient needs related to wound healing as evidenced by estimated needs.  GOAL:   Patient will meet greater than or equal to 90% of their needs  MONITOR:   PO intake, Supplement acceptance, Labs, Weight trends, I & O's, Skin  REASON FOR ASSESSMENT:   Malnutrition Screening Tool    ASSESSMENT:   [name] with T2DM with neuropathy and retinopathy, medical HTN, medical non-compliance, CKD stage IV, admission to [facility] hospital 08/21/17 for nephrotic syndrome with fluid overload and encephalopathy She was admitted to [facility] 10/30/17 with weakness and lethargy, loose stools and leucocytosis.  Meal completion has been 50-100%. Pt reports disliking most of the hospital food. Pt favors more rice, chicken, and soup. Family has been bringing in meals from outside/home. Pt reports having a good appetite currently and PTA with usual consumption of at least 3 meals a day with no other difficulties. Noted pt with history of fluid overload. Pt currently has Nepro shake and Prostat and has been consuming them. RD went over food choices on menu with patient.     Labs and medications reviewed.   NUTRITION - FOCUSED PHYSICAL EXAM:    Most Recent Value  Orbital Region  No depletion  Upper Arm Region  No depletion  Thoracic and Lumbar Region  No depletion  Buccal Region  No depletion  Temple Region  No depletion  Clavicle Bone Region  No depletion  Clavicle and Acromion Bone Region  No depletion  Scapular Bone Region  No depletion  Dorsal Hand  No depletion  Patellar Region  No depletion  Anterior Thigh Region  No  depletion  Posterior Calf Region  No depletion  Edema (RD Assessment)  Mild  Hair  Reviewed  Eyes  Reviewed  Mouth  Reviewed  Skin  Reviewed  Nails  Reviewed       Diet Order:  Diet renal with fluid restriction Room service appropriate? Yes; Fluid consistency: Thin Diet clear liquid Room service appropriate? Yes; Fluid consistency: Thin  EDUCATION NEEDS:   Education needs have been addressed  Skin:  Skin Assessment: Skin Integrity Issues: Skin Integrity Issues:: Unstageable Unstageable: sacrum  Last BM:  2/7  Height:   Ht Readings from Last 1 Encounters:  11/05/17 4\' 11"  (1.499 m)    Weight:   Wt Readings from Last 1 Encounters:  11/11/17 125 lb 7.1 oz (56.9 kg)    Ideal Body Weight:  44.5 kg  BMI:  Body mass index is 25.34 kg/m.  Estimated Nutritional Needs:   Kcal:  1600-1800  Protein:  70-85 grams  Fluid:  Per MD    Corrin Parker, MS, RD, LDN Pager # [phone] After hours/ weekend pager # [phone]

## 2017-11-11 NOTE — Progress Notes (Signed)
Subjective/Complaints: Pt seen lying in bed this AM.  Per nursing, she did not sleep well overnight.   Review of systems: Unreliable due to cognition  Objective: Vital Signs: Blood pressure (!) 170/84, pulse 81, temperature 98.8 F (37.1 C), temperature source Oral, resp. rate 18, height 4' 11"  (1.499 m), weight 56.9 kg (125 lb 7.1 oz), SpO2 94 %. Mr Cervical Spine Wo Contrast  Result Date: 11/10/2017 CLINICAL DATA:  57 y/o F; patient presenting with lower extremity weakness. History of diabetes, neuropathy, retinopathy, hypertension, chronic kidney disease. EXAM: MRI CERVICAL SPINE WITHOUT CONTRAST TECHNIQUE: Multiplanar, multisequence MR imaging of the cervical spine was performed. No intravenous contrast was administered. COMPARISON:  None. FINDINGS: Alignment: Straightening of cervical lordosis.  No listhesis. Vertebrae: No fracture, evidence of discitis, or bone lesion. Cord: No abnormal cord signal. Posterior Fossa, vertebral arteries, paraspinal tissues: Extensive sinus disease within the visible maxillary and sphenoid sinuses. Disc levels: C2-3: No significant disc displacement, foraminal stenosis, or canal stenosis. Mild facet hypertrophy. C3-4: Disc osteophyte complex with bilateral uncovertebral and facet hypertrophy. Mild foraminal and canal stenosis. C4-5: Disc osteophyte complex with bilateral uncovertebral and facet hypertrophy. Mild foraminal and canal stenosis. C5-6: Disc osteophyte complex with left-greater-than-right bilateral uncovertebral and facet hypertrophy. Mild left foraminal stenosis. No canal stenosis. C6-7: No significant disc displacement, foraminal stenosis, or canal stenosis. C7-T1: No significant disc displacement, foraminal stenosis, or canal stenosis. IMPRESSION: 1. No acute osseous abnormality or abnormal cord signal. 2. Mild cervical spondylosis greatest at the C3-4 and C4-5 levels. 3. Mild multifactorial C3-4 and C4-5 canal stenosis. No high-grade canal stenosis. 4.  Mild bilateral C3-4, bilateral C4-5, and left C5-6 foraminal stenosis. No high-grade foraminal stenosis. 5. Extensive sinus disease within visible maxillary and sphenoid sinuses. Electronically Signed   By: Kristine Garbe M.D.   On: 11/10/2017 17:29   Mr Lumbar Spine Wo Contrast  Result Date: 11/10/2017 CLINICAL DATA:  57 y/o F; evaluation of lower extremity weakness. History of diabetes, neuropathy, retinopathy, hypertension, and chronic kidney disease. EXAM: MRI LUMBAR SPINE WITHOUT CONTRAST TECHNIQUE: Multiplanar, multisequence MR imaging of the lumbar spine was performed. No intravenous contrast was administered. COMPARISON:  Concurrent cervical spine MRI. FINDINGS: Motion degraded study. Segmentation:  Standard. Alignment:  Physiologic. Vertebrae:  No fracture, evidence of discitis, or bone lesion. Conus medullaris and cauda equina: Conus extends to the L1 level. On the sagittal T2 weighted sequence there is increased signal within the conus medullaris without appreciable expansion. Paraspinal and other soft tissues: Negative. Disc levels: L1-2: No significant disc displacement, foraminal stenosis, or canal stenosis. L2-3: No significant disc displacement, foraminal stenosis, or canal stenosis. L3-4: Small disc bulge. No significant foraminal or canal stenosis. L4-5: Small disc bulge and mild facet hypertrophy. Mild bilateral foraminal and canal stenosis. L5-S1: No significant disc displacement, foraminal stenosis, or canal stenosis. IMPRESSION: 1. Increased signal within the conus medullaris on sagittal sequences may represent myelitis or edema from lower cord lesion, incompletely visualized. Thoracic spine MRI is recommended to further evaluate. 2. Mild lumbar spondylosis with predominantly discogenic degenerative changes at the L3-4 and L4-5 levels. 3. Mild L4-5 foraminal and canal stenosis.  No high-grade stenosis. These results will be called to the ordering clinician or representative by the  Radiologist Assistant, and communication documented in the PACS or zVision Dashboard. Electronically Signed   By: Kristine Garbe M.D.   On: 11/10/2017 17:40   Results for orders placed or performed during the hospital encounter of 11/05/17 (from the past 72 hour(s))  Glucose, capillary  Status: Abnormal   Collection Time: 11/08/17 11:50 AM  Result Value Ref Range   Glucose-Capillary 204 (H) 65 - 99 mg/dL  Occult blood card to lab, stool RN will collect     Status: Abnormal   Collection Time: 11/08/17  1:19 PM  Result Value Ref Range   Fecal Occult Bld POSITIVE (A) NEGATIVE    Comment: Performed at [facility] [address] [place], [place] [address]  Glucose, capillary     Status: Abnormal   Collection Time: 11/08/17  5:08 PM  Result Value Ref Range   Glucose-Capillary 44 (LL) 65 - 99 mg/dL  Glucose, capillary     Status: Abnormal   Collection Time: 11/08/17  5:29 PM  Result Value Ref Range   Glucose-Capillary 40 (LL) 65 - 99 mg/dL  Glucose, capillary     Status: None   Collection Time: 11/08/17  6:16 PM  Result Value Ref Range   Glucose-Capillary 77 65 - 99 mg/dL  Glucose, capillary     Status: Abnormal   Collection Time: 11/08/17  9:50 PM  Result Value Ref Range   Glucose-Capillary 141 (H) 65 - 99 mg/dL   Comment 1 Notify RN   Basic metabolic panel     Status: Abnormal   Collection Time: 11/09/17  4:23 AM  Result Value Ref Range   Sodium 139 135 - 145 mmol/L   Potassium 5.3 (H) 3.5 - 5.1 mmol/L   Chloride 109 101 - 111 mmol/L   CO2 20 (L) 22 - 32 mmol/L   Glucose, Bld 99 65 - 99 mg/dL   BUN 78 (H) 6 - 20 mg/dL   Creatinine, Ser 2.51 (H) 0.44 - 1.00 mg/dL   Calcium 8.1 (L) 8.9 - 10.3 mg/dL   GFR calc non Af Amer 20 (L) >60 mL/min   GFR calc Af Amer 24 (L) >60 mL/min    Comment: (NOTE) The eGFR has been calculated using the CKD EPI equation. This calculation has not been validated in all clinical situations. eGFR's persistently <60 mL/min signify  possible Chronic Kidney Disease.    Anion gap 10 5 - 15    Comment: Performed at [facility] [address] [place], [place] [address]  CBC with Differential/Platelet     Status: Abnormal   Collection Time: 11/09/17  4:23 AM  Result Value Ref Range   WBC 10.7 (H) 4.0 - 10.5 K/uL   RBC 2.68 (L) 3.87 - 5.11 MIL/uL   Hemoglobin 7.4 (L) 12.0 - 15.0 g/dL   HCT 22.8 (L) 36.0 - 46.0 %   MCV 85.1 78.0 - 100.0 fL   MCH 27.6 26.0 - 34.0 pg   MCHC 32.5 30.0 - 36.0 g/dL   RDW 16.9 (H) 11.5 - 15.5 %   Platelets 482 (H) 150 - 400 K/uL   Neutrophils Relative % 59 %   Neutro Abs 6.3 1.7 - 7.7 K/uL   Lymphocytes Relative 34 %   Lymphs Abs 3.6 0.7 - 4.0 K/uL   Monocytes Relative 5 %   Monocytes Absolute 0.6 0.1 - 1.0 K/uL   Eosinophils Relative 2 %   Eosinophils Absolute 0.2 0.0 - 0.7 K/uL   Basophils Relative 0 %   Basophils Absolute 0.0 0.0 - 0.1 K/uL    Comment: Performed at [facility], [address], [place], [place] [address]  Glucose, capillary     Status: None   Collection Time: 11/09/17  6:51 AM  Result Value Ref Range  Glucose-Capillary 88 65 - 99 mg/dL   Comment 1 Notify RN   Glucose, capillary     Status: None   Collection Time: 11/09/17 10:04 AM  Result Value Ref Range   Glucose-Capillary 78 65 - 99 mg/dL  Occult blood card to lab, stool RN will collect     Status: Abnormal   Collection Time: 11/09/17 10:30 AM  Result Value Ref Range   Fecal Occult Bld POSITIVE (A) NEGATIVE    Comment: Performed at [facility] [address] [place], [place] [address]  Glucose, capillary     Status: Abnormal   Collection Time: 11/09/17  4:54 PM  Result Value Ref Range   Glucose-Capillary 142 (H) 65 - 99 mg/dL   Comment 1 Notify RN   Glucose, capillary     Status: Abnormal   Collection Time: 11/09/17  8:23 PM  Result Value Ref Range   Glucose-Capillary 170 (H) 65 - 99 mg/dL  Basic metabolic panel     Status: Abnormal   Collection Time: 11/10/17  6:47 AM   Result Value Ref Range   Sodium 141 135 - 145 mmol/L   Potassium 5.1 3.5 - 5.1 mmol/L   Chloride 110 101 - 111 mmol/L   CO2 19 (L) 22 - 32 mmol/L   Glucose, Bld 122 (H) 65 - 99 mg/dL   BUN 80 (H) 6 - 20 mg/dL   Creatinine, Ser 2.39 (H) 0.44 - 1.00 mg/dL   Calcium 8.1 (L) 8.9 - 10.3 mg/dL   GFR calc non Af Amer 22 (L) >60 mL/min   GFR calc Af Amer 25 (L) >60 mL/min    Comment: (NOTE) The eGFR has been calculated using the CKD EPI equation. This calculation has not been validated in all clinical situations. eGFR's persistently <60 mL/min signify possible Chronic Kidney Disease.    Anion gap 12 5 - 15    Comment: Performed at [facility] [address], [place], [place] [address]  CBC with Differential/Platelet     Status: Abnormal   Collection Time: 11/10/17  6:47 AM  Result Value Ref Range   WBC 10.2 4.0 - 10.5 K/uL   RBC 2.74 (L) 3.87 - 5.11 MIL/uL   Hemoglobin 7.7 (L) 12.0 - 15.0 g/dL   HCT 23.8 (L) 36.0 - 46.0 %   MCV 86.9 78.0 - 100.0 fL   MCH 28.1 26.0 - 34.0 pg   MCHC 32.4 30.0 - 36.0 g/dL   RDW 17.7 (H) 11.5 - 15.5 %   Platelets 472 (H) 150 - 400 K/uL   Neutrophils Relative % 59 %   Neutro Abs 6.2 1.7 - 7.7 K/uL   Lymphocytes Relative 32 %   Lymphs Abs 3.2 0.7 - 4.0 K/uL   Monocytes Relative 5 %   Monocytes Absolute 0.5 0.1 - 1.0 K/uL   Eosinophils Relative 3 %   Eosinophils Absolute 0.3 0.0 - 0.7 K/uL   Basophils Relative 1 %   Basophils Absolute 0.1 0.0 - 0.1 K/uL    Comment: Performed at [facility], [address], [place], [place] [address]  Glucose, capillary     Status: Abnormal   Collection Time: 11/10/17  6:54 AM  Result Value Ref Range   Glucose-Capillary 115 (H) 65 - 99 mg/dL  Glucose, capillary     Status: Abnormal   Collection Time: 11/10/17 11:46 AM  Result Value Ref Range   Glucose-Capillary 183 (H) 65 - 99 mg/dL  Glucose, capillary  Status: Abnormal   Collection Time: 11/10/17  6:06 PM  Result Value Ref Range    Glucose-Capillary 123 (H) 65 - 99 mg/dL  Glucose, capillary     Status: Abnormal   Collection Time: 11/10/17  8:37 PM  Result Value Ref Range   Glucose-Capillary 126 (H) 65 - 99 mg/dL  Glucose, capillary     Status: Abnormal   Collection Time: 11/11/17  6:38 AM  Result Value Ref Range   Glucose-Capillary 105 (H) 65 - 99 mg/dL    General: NAD. Vital signs reviewed. HEENT: Normocephalic, atraumatic.  Cardio: RRR and no JVD. Resp: CTA B/L and Unlabored GI: BS positive and ND Musc/Skel:  No edmea or tenderness. Neuro: Alert and oriented x1. Motor:  LUE: 5/5 proximal to distal RUE: 4+/5 proximal to distal (appears to be stable) RLE: HF: 2/5, ADF/PF 2/5 (stable) LLE: HF 2/5, ADF/PF 2+/5 (stable) Skin: Intact. Warm and dry.    Assessment/Plan: 1. Functional deficits secondary to debility and encephalopathy which require 3+ hours per day of interdisciplinary therapy in a comprehensive inpatient rehab setting. Physiatrist is providing close team supervision and 24 hour management of active medical problems listed below. Physiatrist and rehab team continue to assess barriers to discharge/monitor patient progress toward functional and medical goals. FIM: Function - Bathing Position: Bed Body parts bathed by patient: Right arm, Left arm, Chest, Abdomen, Front perineal area, Right upper leg, Left upper leg Body parts bathed by helper: Buttocks, Right lower leg, Left lower leg Assist Level: Touching or steadying assistance(Pt > 75%)  Function- Upper Body Dressing/Undressing What is the patient wearing?: Pull over shirt/dress, Bra Bra - Perfomed by patient: Thread/unthread left bra strap Bra - Perfomed by helper: Thread/unthread right bra strap, Hook/unhook bra (pull down sports bra), Thread/unthread left bra strap Pull over shirt/dress - Perfomed by patient: Thread/unthread right sleeve, Thread/unthread left sleeve, Put head through opening, Pull shirt over trunk Pull over shirt/dress -  Perfomed by helper: Thread/unthread right sleeve, Thread/unthread left sleeve, Put head through opening, Pull shirt over trunk Assist Level: More than reasonable time, Set up Function - Lower Body Dressing/Undressing What is the patient wearing?: Pants Position: Bed Underwear - Performed by helper: Thread/unthread right underwear leg, Thread/unthread left underwear leg Pants- Performed by helper: Thread/unthread right pants leg, Thread/unthread left pants leg, Pull pants up/down Assist for footwear: Maximal assist Assist for lower body dressing: Touching or steadying assistance (Pt > 75%) Set up : To obtain clothing/put away  Function - Toileting Toileting activity did not occur: No continent bowel/bladder event Toileting steps completed by helper: Adjust clothing prior to toileting, Performs perineal hygiene, Adjust clothing after toileting(per [place], NT report) Assist level: Touching or steadying assistance (Pt.75%)(per [place] Fl[place], NT report)  Function - Air cabin crew transfer activity did not occur: Safety/medical concerns Assist level to toilet: 2 helpers(per [place], NT report)  Function - Chair/bed transfer Chair/bed transfer method: Lateral scoot Chair/bed transfer assist level: Total assist (Pt < 25%) Chair/bed transfer assistive device: Sliding board(beasy board) Chair/bed transfer details: Verbal cues for safe use of DME/AE, Verbal cues for precautions/safety, Verbal cues for technique, Manual facilitation for placement, Manual facilitation for weight shifting  Function - Locomotion: Wheelchair Will patient use wheelchair at discharge?: Yes Type: Manual Max wheelchair distance: 100' Assist Level: Touching or steadying assistance (Pt > 75%) Assist Level: Touching or steadying assistance (Pt > 75%) Wheel 150 feet activity did not occur: Safety/medical concerns Turns around,maneuvers to table,bed, and toilet,negotiates 3% grade,maneuvers on  rugs and over doorsills: No Function - Locomotion: Ambulation Ambulation activity did not occur: Safety/medical concerns Walk 10 feet activity did not occur: Safety/medical concerns Walk 50 feet with 2 turns activity did not occur: Safety/medical concerns Walk 150 feet activity did not occur: Safety/medical concerns Walk 10 feet on uneven surfaces activity did not occur: Safety/medical concerns  Function - Comprehension Comprehension: Auditory Comprehension assist level: Understands basic 75 - 89% of the time/ requires cueing 10 - 24% of the time  Function - Expression Expression: Verbal Expression assist level: Expresses basic 75 - 89% of the time/requires cueing 10 - 24% of the time. Needs helper to occlude trach/needs to repeat words.  Function - Social Interaction Social Interaction assist level: Interacts appropriately 75 - 89% of the time - Needs redirection for appropriate language or to initiate interaction.  Function - Problem Solving Problem solving assist level: Solves basic 50 - 74% of the time/requires cueing 25 - 49% of the time  Function - Memory Memory assist level: Recognizes or recalls 50 - 74% of the time/requires cueing 25 - 49% of the time Patient normally able to recall (first 3 days only): That he or she is in a hospital, Current season  Medical Problem List and Plan: 1.Functional and mobility deficitssecondary to debility and encephalopathy from multiple medical issues   Cont CIR   MRI ordered C,L- spine reviewed showing increased signaling conus medullaris, will speak with Neurology  2. DVT Prophylaxis/Anticoagulation: Pharmaceutical:Lovenox 3. Pain Management:tylenol prn 4. Mood:LCSW to follow for evaluation and support. 5. Neuropsych: This patientis not fullycapable of making decisions onherown behalf. 6. Skin/Wound Care:Air mattress for pressure relief measures. Local measures to help managesacral /labialMASD PRAFO's for  bilateral LE's 7. Fluids/Electrolytes/Nutrition:Monitor I/O.    Continue to offer supplements to help with protein calorie malnutrition.   Cont Remeron 15 8. C diff colitis:    Vancomycinto be completed 2/6 Continue enteric precautions Stools are improving 9. HTN: Monitor BP bid.    Nifedipine bid increased to 60 on 2/6   Imdur increased to 90 on 2/4   Metoprolol increased to 100 BID on 2/3   Will consider further increase tomorrow Vitals:   11/10/17 1413 11/11/17 0512  BP: (!) 151/80 (!) 170/84  Pulse: 81 81  Resp: 16 18  Temp: 98.1 F (36.7 C) 98.8 F (37.1 C)  SpO2: 98% 94%  10 T2DM with retinopathy, neuropathy, andnephropathy:Monitor BS ac/hs.    SSI for elevated BS CBG (last 3)  Recent Labs    11/10/17 1806 11/10/17 2037 11/11/17 0638  GLUCAP 123* 126* 105*    Relatively controlled on 2/7 11. H/o depression: Continue Effexor and Remeron.  12. Neurogenic bladder:Has chronic indwelling catheter for 2+ months.   D/ced foley and attempt to manage with I/O caths   Follow, appears to have voided spontaneously this AM 13.AKI onCKD with history of overload:    Cr 2.39 on 2/6   Encourage fluids   IVF qhs x2 completed   Labs ordered for tomorrow   Cont to monitor 14. Diffuse CAD: treated medically with Metoprolol, Zetia and Plavix. 15.  ABLA on anemia of chronic disease   Symptomatic anemia, poor PT tolerance dizziness, fatigue   Check stool OB remains pending 2/7   Hb 7.7 on 2/6   Labs ordered for tomorrow 16. Leukocytosis: Resolved   WBCs 10.2 on 2/6   Labs ordered for tomorrow 17. Hypoalbuminemia   Cont supplement 18. Hyperkalemia   K+ 5.1 on 2/6   Labs ordered  for tomorrow   Cont to monitor  LOS (Days) 6 A FACE TO FACE EVALUATION WAS PERFORMED  Saki Legore Lorie Phenix 11/11/2017, 9:59 AM

## 2017-11-11 NOTE — Progress Notes (Signed)
Occupational Therapy Session Note  Patient Details  Name: Laura [name] MRN: [id number] Date of Birth: [birth year]  Today's Date: 11/11/2017 OT Individual Time: 1400-1430 OT Individual Time Calculation (min): 30 min    Short Term Goals: Week 1:  OT Short Term Goal 1 (Week 1): Pt will completed UB dressing with minA  OT Short Term Goal 2 (Week 1): Pt will complete LB dressing with modA OT Short Term Goal 3 (Week 1): Pt will perform shower transfer with mod A  OT Short Term Goal 4 (Week 1): Pt will particpiate in OOB activities for 30 mins OT Short Term Goal 5 (Week 1): Pt will complete grooming at sink side with min A   Skilled Therapeutic Interventions/Progress Updates:    Focus on scooting forward and backwards in chair with reciprocal movement. Transitioned into the dayroom and to the standing frame with focus on maintaining [name] upright trunk posture and a neural pelvic stance. PT able to squeeze her gluts and perform anterior pelvic tilt but with difficulty with contracting her quads. Returned to room and resting in tilt in space w/c.  Therapy Documentation Precautions:  Precautions Precautions: Fall Precaution Comments: monitor blood pressure Restrictions Weight Bearing Restrictions: No   Vital Signs: Therapy Vitals Temp: 98.1 F (36.7 C) Temp Source: Oral Pulse Rate: 66 Resp: 18 BP: (!) 113/59 Patient Position (if appropriate): Sitting Oxygen Therapy SpO2: 96 % O2 Device: Not Delivered Pain: No reports of pain  ADL: ADL Eating: Not assessed Grooming: Minimal assistance, Setup Where Assessed-Grooming: Bed level Upper Body Bathing: Minimal assistance Where Assessed-Upper Body Bathing: Bed level Lower Body Bathing: Moderate assistance Where Assessed-Lower Body Bathing: Bed level Upper Body Dressing: Moderate assistance Where Assessed-Upper Body Dressing: Bed level Lower Body Dressing: Maximal assistance Where Assessed-Lower Body Dressing: Bed level Toileting:  Maximal assistance Where Assessed-Toileting: Bedside Commode Toilet Transfer: Unable to assess(due to dizziness and BP ) Toilet Transfer Method: Unable to assess Tub/Shower Transfer: Unable to assess Tub/Shower Transfer Method: Unable to assess Social research officer, government: Unable to assess Social research officer, government Method: Unable to assess ADL Comments: unable to completed transfers at time of eval. pt dizzinessn oted upon upright sitting-=bp monitored throughout session in sitting and HOB elevated   See Function Navigator for Current Functional Status.   Therapy/Group: Individual Therapy  Willeen [facility] 11/11/2017, 4:21 PM

## 2017-11-11 NOTE — Progress Notes (Signed)
Occupational Therapy Session Note  Patient Details  Name: Laura [name] MRN: [id number] Date of Birth: 02/06/1961  Today's Date: 11/11/2017 OT Individual Time: 1306-1400 OT Individual Time Calculation (min): 54 min    Short Term Goals: Week 1:  OT Short Term Goal 1 (Week 1): Pt will completed UB dressing with minA  OT Short Term Goal 2 (Week 1): Pt will complete LB dressing with modA OT Short Term Goal 3 (Week 1): Pt will perform shower transfer with mod A  OT Short Term Goal 4 (Week 1): Pt will particpiate in OOB activities for 30 mins OT Short Term Goal 5 (Week 1): Pt will complete grooming at sink side with min A   Skilled Therapeutic Interventions/Progress Updates:    Pt presents supine in bed, reporting fatigue though agreeable to OT tx session with encouragement. Pt reporting increased pain in buttocks, managed this session by rest and repositioning. Pt completing bil UE AROM and bil LE AAROM/PROM across multiple planes for 10-15x each motion. TotalA to don footwear prior to transfer supine>sitting EOB with MaxA. Pt briefly maintaining static sitting EOB with MinGuard, with increased lateral leans and requiring min-mod assist to maintain static balance. TotalA squat pivot transfer EOB>TIS w/c, after rest break Pt requiring MaxA for lateral leaning/recipricol scooting to adjust hip positioning in chair. Pt participating in additional UE AROM (shoulder forward flexion, horizontal abd/adduction, digit ext/flex), and LE AAROM including extension/flexion, hip adduction/abduction for 10-15x for 3-5 sets each. Also use of pillow as bolster next to R hip/LE as Pt's leg leaning into leg rest. Pt left semi-reclined in TIS w/c, QRB donned, needs within reach.   Therapy Documentation Precautions:  Precautions Precautions: Fall Precaution Comments: monitor blood pressure Restrictions Weight Bearing Restrictions: No       See Function Navigator for Current Functional  Status.   Therapy/Group: Individual Therapy  Raymondo Band 11/11/2017, 4:07 PM

## 2017-11-12 ENCOUNTER — Inpatient Hospital Stay (HOSPITAL_COMMUNITY): Payer: No Typology Code available for payment source | Admitting: Speech Pathology

## 2017-11-12 ENCOUNTER — Inpatient Hospital Stay (HOSPITAL_COMMUNITY): Payer: No Typology Code available for payment source | Admitting: Occupational Therapy

## 2017-11-12 ENCOUNTER — Inpatient Hospital Stay (HOSPITAL_COMMUNITY): Payer: No Typology Code available for payment source

## 2017-11-12 DIAGNOSIS — K625 Hemorrhage of anus and rectum: Secondary | ICD-10-CM | POA: Insufficient documentation

## 2017-11-12 DIAGNOSIS — N049 Nephrotic syndrome with unspecified morphologic changes: Secondary | ICD-10-CM

## 2017-11-12 LAB — VITAMIN B12: VITAMIN B 12: 870 pg/mL (ref 180–914)

## 2017-11-12 LAB — CBC WITH DIFFERENTIAL/PLATELET
BASOS ABS: 0.1 10*3/uL (ref 0.0–0.1)
BASOS PCT: 1 %
Eosinophils Absolute: 0.4 10*3/uL (ref 0.0–0.7)
Eosinophils Relative: 5 %
HEMATOCRIT: 21.9 % — AB (ref 36.0–46.0)
HEMOGLOBIN: 6.9 g/dL — AB (ref 12.0–15.0)
Lymphocytes Relative: 39 %
Lymphs Abs: 3.1 10*3/uL (ref 0.7–4.0)
MCH: 27.5 pg (ref 26.0–34.0)
MCHC: 31.5 g/dL (ref 30.0–36.0)
MCV: 87.3 fL (ref 78.0–100.0)
Monocytes Absolute: 0.5 10*3/uL (ref 0.1–1.0)
Monocytes Relative: 6 %
NEUTROS ABS: 4 10*3/uL (ref 1.7–7.7)
NEUTROS PCT: 49 %
Platelets: 475 10*3/uL — ABNORMAL HIGH (ref 150–400)
RBC: 2.51 MIL/uL — AB (ref 3.87–5.11)
RDW: 17.1 % — ABNORMAL HIGH (ref 11.5–15.5)
WBC: 7.9 10*3/uL (ref 4.0–10.5)

## 2017-11-12 LAB — URINALYSIS, ROUTINE W REFLEX MICROSCOPIC
Bilirubin Urine: NEGATIVE
GLUCOSE, UA: NEGATIVE mg/dL
HGB URINE DIPSTICK: NEGATIVE
Ketones, ur: NEGATIVE mg/dL
Nitrite: NEGATIVE
Protein, ur: 100 mg/dL — AB
SPECIFIC GRAVITY, URINE: 1.012 (ref 1.005–1.030)
SQUAMOUS EPITHELIAL / LPF: NONE SEEN
pH: 6 (ref 5.0–8.0)

## 2017-11-12 LAB — FERRITIN: FERRITIN: 282 ng/mL (ref 11–307)

## 2017-11-12 LAB — IRON AND TIBC
IRON: 25 ug/dL — AB (ref 28–170)
SATURATION RATIOS: 15 % (ref 10.4–31.8)
TIBC: 172 ug/dL — AB (ref 250–450)
UIBC: 147 ug/dL

## 2017-11-12 LAB — BASIC METABOLIC PANEL
ANION GAP: 10 (ref 5–15)
BUN: 82 mg/dL — ABNORMAL HIGH (ref 6–20)
CO2: 20 mmol/L — AB (ref 22–32)
Calcium: 8.2 mg/dL — ABNORMAL LOW (ref 8.9–10.3)
Chloride: 110 mmol/L (ref 101–111)
Creatinine, Ser: 2.58 mg/dL — ABNORMAL HIGH (ref 0.44–1.00)
GFR, EST AFRICAN AMERICAN: 23 mL/min — AB (ref >60)
GFR, EST NON AFRICAN AMERICAN: 20 mL/min — AB (ref >60)
Glucose, Bld: 96 mg/dL (ref 65–99)
Potassium: 5 mmol/L (ref 3.5–5.1)
SODIUM: 140 mmol/L (ref 135–145)

## 2017-11-12 LAB — GLUCOSE, CAPILLARY
GLUCOSE-CAPILLARY: 85 mg/dL (ref 65–99)
GLUCOSE-CAPILLARY: 98 mg/dL (ref 65–99)
GLUCOSE-CAPILLARY: 119 mg/dL — AB (ref 65–99)
Glucose-Capillary: 85 mg/dL (ref 65–99)
Glucose-Capillary: 90 mg/dL (ref 65–99)

## 2017-11-12 LAB — PREPARE RBC (CROSSMATCH)

## 2017-11-12 MED ORDER — METOCLOPRAMIDE HCL 5 MG/ML IJ SOLN
5.0000 mg | Freq: Once | INTRAMUSCULAR | Status: AC
  Administered 2017-11-13: 5 mg via INTRAVENOUS
  Filled 2017-11-12: qty 2

## 2017-11-12 MED ORDER — ACETAMINOPHEN 325 MG PO TABS
650.0000 mg | ORAL_TABLET | Freq: Once | ORAL | Status: AC
  Administered 2017-11-12: 650 mg via ORAL
  Filled 2017-11-12: qty 2

## 2017-11-12 MED ORDER — MIRTAZAPINE 15 MG PO TABS
15.0000 mg | ORAL_TABLET | Freq: Every day | ORAL | Status: DC
  Administered 2017-11-12 – 2017-12-01 (×20): 15 mg via ORAL
  Filled 2017-11-12 (×20): qty 1

## 2017-11-12 MED ORDER — SODIUM CHLORIDE 0.9 % IV SOLN
Freq: Once | INTRAVENOUS | Status: AC
  Administered 2017-11-12: 21:00:00 via INTRAVENOUS

## 2017-11-12 MED ORDER — PEG-KCL-NACL-NASULF-NA ASC-C 100 G PO SOLR
0.5000 | Freq: Once | ORAL | Status: AC
  Administered 2017-11-13: 100 g via ORAL
  Filled 2017-11-12: qty 1

## 2017-11-12 MED ORDER — METOCLOPRAMIDE HCL 5 MG/ML IJ SOLN
5.0000 mg | Freq: Once | INTRAMUSCULAR | Status: AC
  Administered 2017-11-12: 5 mg via INTRAVENOUS
  Filled 2017-11-12: qty 2

## 2017-11-12 MED ORDER — FUROSEMIDE 10 MG/ML IJ SOLN
20.0000 mg | Freq: Once | INTRAMUSCULAR | Status: AC
  Administered 2017-11-12: 20 mg via INTRAVENOUS
  Filled 2017-11-12: qty 2

## 2017-11-12 MED ORDER — PEG-KCL-NACL-NASULF-NA ASC-C 100 G PO SOLR: 1.0000 | Freq: Once | ORAL | Status: DC

## 2017-11-12 MED ORDER — TRAMADOL HCL 50 MG PO TABS: 25.0000 mg | ORAL_TABLET | Freq: Four times a day (QID) | ORAL | Status: DC | PRN

## 2017-11-12 MED ORDER — PATIROMER SORBITEX CALCIUM 8.4 G PO PACK
8.4000 g | PACK | Freq: Every day | ORAL | Status: DC
  Administered 2017-11-15 – 2017-11-21 (×7): 8.4 g via ORAL
  Filled 2017-11-12 (×14): qty 4

## 2017-11-12 MED ORDER — PEG-KCL-NACL-NASULF-NA ASC-C 100 G PO SOLR
0.5000 | Freq: Once | ORAL | Status: AC
  Administered 2017-11-12: 100 g via ORAL
  Filled 2017-11-12 (×2): qty 1

## 2017-11-12 NOTE — Progress Notes (Signed)
Speech Language Pathology Weekly Progress and Session Note  Patient Details  Name: Laura [name] MRN: [id number] Date of Birth: [birth year]  Beginning of progress report period:  November 05, 2017  End of progress report period: November 12, 2017   Today's Date: 11/12/2017 SLP Individual Time: 0830-0900 SLP Individual Time Calculation (min): 30 min  Short Term Goals: Week 1: SLP Short Term Goal 1 (Week 1): Pt will utilize external memory aides to recall new dialy information with Mod A cues.  SLP Short Term Goal 1 - Progress (Week 1): Progressing toward goal SLP Short Term Goal 2 (Week 1): Pt will complete basic familiar problem solving task related to ADL with Mod A cues.  SLP Short Term Goal 2 - Progress (Week 1): Met SLP Short Term Goal 3 (Week 1): Pt will initiate and respond to task in timely manner with Mod A cues.  SLP Short Term Goal 3 - Progress (Week 1): Met SLP Short Term Goal 4 (Week 1): Pt will sustain attention to basic familiar task for ~ 10 minutes with Mod A cues.  SLP Short Term Goal 4 - Progress (Week 1): Met    New Short Term Goals: Week 2: SLP Short Term Goal 1 (Week 2): Pt will utilize external memory aides to recall new dialy information with Mod A cues.  SLP Short Term Goal 2 (Week 2): Pt will complete basic familiar problem solving task related to ADL with Min A cues.  SLP Short Term Goal 3 (Week 2): Pt will sustain attention to basic familiar task for ~ 10 minutes with Min A cues.  SLP Short Term Goal 4 (Week 2): Pt will increase vocal intensity to achieve intelligibility in mildly noisy environments with mod verbal cues.    Weekly Progress Updates:  Pt has made minimal functional gains this reporting period and has met 3 out of 4 short term goals.  Pt is currently mod-max assist for basic tasks due to mild-moderate cognitive impairments which are further exacerbated by profound fatigue and multiple medical complications.  SLP also initiated goals for speech  intelligibility on this date given that pt has significantly decreased vocal intensity in the setting of poor breath support for speech and decreased cardiorespiratory endurance.  As a result, pt would continue to benefit from skilled ST while inpatient in order to maximize functional independence and reduce burden of care prior to discharge.  Anticipate that pt will need 24/7 supervision at discharge in addition to [place] follow up at next level of care.      Intensity: Minumum of 1-2 x/day, 30 to 90 minutes Frequency: 3 to 5 out of 7 days Duration/Length of Stay: 18 to 21 days Treatment/Interventions: Cognitive remediation/compensation;Cueing hierarchy;Functional tasks;Patient/family education;Therapeutic Activities;Internal/external aids   Daily Session  Skilled Therapeutic Interventions:  Pt was seen for skilled ST targeting cognitive goals.  Pt asleep upon arrival but easily awakened to voice and light touch.  Despite being easy to awaken pt remained lethargic throughout therapy session.  Pt with complaints of nausea but did attempt to eat some breakfast with encouragement.  Pt asked therapist if she had surgery last night because she felt tired and groggy this morning.  Discussed recommendations from gastroenterology re: possible colonoscopy as pt can tolerate but reinforced that pt did not have surgery last night and that her mental "fogginess" is likely a result of her prolonged illness and subsequent encephalopathy.  Pt was left in bed with bed alarm set and call bell within reach.  Goals updated  to reflect current progress and plan of care.         Function:   Eating Eating                 Cognition Comprehension Comprehension assist level: Understands basic 90% of the time/cues < 10% of the time  Expression   Expression assist level: Expresses basic 90% of the time/requires cueing < 10% of the time.  Social Interaction Social Interaction assist level: Interacts appropriately 75 -  89% of the time - Needs redirection for appropriate language or to initiate interaction.  Problem Solving Problem solving assist level: Solves basic 50 - 74% of the time/requires cueing 25 - 49% of the time  Memory Memory assist level: Recognizes or recalls 50 - 74% of the time/requires cueing 25 - 49% of the time   General    Pain Pain Assessment Pain Assessment: 0-10 Pain Score: 2  Pain Type: Acute pain Pain Location: Buttocks Pain Orientation: Posterior Pain Descriptors / Indicators: Aching Pain Frequency: Intermittent Pain Onset: Gradual Pain Intervention(s): Medication (See eMAR)  Therapy/Group: Individual Therapy  Arjuna Doeden, Selinda Orion 11/12/2017, 12:50 PM

## 2017-11-12 NOTE — Progress Notes (Signed)
Occupational Therapy Note  Patient Details  Name: Laura [name] MRN: [id number] Date of Birth: [birth year]  Today's Date: 11/12/2017 OT Missed Time: 62 Minutes Missed Time Reason: Patient fatigue;Patient ill (comment)(low hemoglobin)  Pt received supine in be asleep with 1L O2 via nasal canula.  Pt reports feeling "lousy" due to fatigue and low hemoglobin (6.9).  Pt requesting to rest at this time, expressing desire to engage in PM session.  Will follow as pt status and schedule allow.   [name] 11/12/2017, 12:24 PM

## 2017-11-12 NOTE — Progress Notes (Signed)
Daily Rounding Note  11/12/2017, 10:42 AM  LOS: 7 days   SUBJECTIVE:   No further blood PR  Started clear diet this AM.  Denies abdominal or other pain  OBJECTIVE:         Vital signs in last 24 hours:    Temp:  [98 F (36.7 C)-98.1 F (36.7 C)] 98 F (36.7 C) (02/08 0543) Pulse Rate:  [66-77] 77 (02/08 0943) Resp:  [16-18] 16 (02/08 0543) BP: (113-133)/(59-67) 133/67 (02/08 0943) SpO2:  [96 %-100 %] 98 % (02/08 0946) Weight:  [56.9 kg (125 lb 7.1 oz)] 56.9 kg (125 lb 7.1 oz) (02/08 0543) Last BM Date: 11/10/17 Filed Weights   11/10/17 0500 11/11/17 0512 11/12/17 0543  Weight: 56.8 kg (125 lb 3.5 oz) 56.9 kg (125 lb 7.1 oz) 56.9 kg (125 lb 7.1 oz)   General: pale, chronically ill looking.  Face looks puffy   Heart: RRR Chest: clear bil.  No labored breathing Abdomen: soft, active BS.  NT, ND  Extremities: LE edema Neuro/Psych:  Cooperative, pleasant, follows instructions.  Moves all 4 limbs.  Appropriate.  Not fully oriented.    Intake/Output from previous day: 02/07 0701 - 02/08 0700 In: 360 [P.O.:360] Out: 775 [Urine:775]  Intake/Output this shift: No intake/output data recorded.  Lab Results: Recent Labs    11/10/17 0647 11/12/17 0515  WBC 10.2 7.9  HGB 7.7* 6.9*  HCT 23.8* 21.9*  PLT 472* 475*   BMET Recent Labs    11/10/17 0647 11/12/17 0515  NA 141 140  K 5.1 5.0  CL 110 110  CO2 19* 20*  GLUCOSE 122* 96  BUN 80* 82*  CREATININE 2.39* 2.58*  CALCIUM 8.1* 8.2*   LFT No results for input(s): PROT, ALBUMIN, AST, ALT, ALKPHOS, BILITOT, BILIDIR, IBILI in the last 72 hours. PT/INR No results for input(s): LABPROT, INR in the last 72 hours. Hepatitis Panel No results for input(s): HEPBSAG, HCVAB, HEPAIGM, HEPBIGM in the last 72 hours.  Studies/Results: Mr Brain Wo Contrast  Result Date: 11/11/2017 CLINICAL DATA:  Type 2 diabetes.  Encephalopathy.  Weakness. EXAM: MRI HEAD WITHOUT  CONTRAST TECHNIQUE: Multiplanar, multiecho pulse sequences of the brain and surrounding structures were obtained without intravenous contrast. COMPARISON:  09/16/2017 MRI.  09/08/2017 CT. FINDINGS: Brain: There is no new or worsening finding. On the previous study, the patient had multiple acute foci of restricted diffusion within the brain in a distribution suggesting acute toxic or metabolic demyelination. This was most prominent within the middle cerebellar peduncles, the corpus callosum and the hemispheric white matter. Today, there is mild lingering T2 shine through signal in those regions. No new foci of restricted diffusion. No cortical or large vessel territory abnormality. No sign of mass lesion, hemorrhage, hydrocephalus or extra-axial collection. Vascular: Major vessels at the base of the brain show flow. Skull and upper cervical spine: Negative Sinuses/Orbits: Inflammatory changes of the paranasal sinuses on the right. Other: None IMPRESSION: No new or progressive findings. Evolutionary changes of the multiple foci of T2 signal and restricted diffusion within the deep brain as outlined above. Foci show abnormal T2 signal presently with T2 shine through but no true restricted diffusion. No new lesions. Presumably, this was toxic or metabolic demyelination which has run its course. Certainly, the patient could have lingering neurologic sequelae due to the underlying damage in the areas of involvement. Electronically Signed   By: Nelson Chimes M.D.   On: 11/11/2017 21:40   Mr  Cervical Spine Wo Contrast  Result Date: 11/10/2017 CLINICAL DATA:  57 y/o F; patient presenting with lower extremity weakness. History of diabetes, neuropathy, retinopathy, hypertension, chronic kidney disease. EXAM: MRI CERVICAL SPINE WITHOUT CONTRAST TECHNIQUE: Multiplanar, multisequence MR imaging of the cervical spine was performed. No intravenous contrast was administered. COMPARISON:  None. FINDINGS: Alignment: Straightening  of cervical lordosis.  No listhesis. Vertebrae: No fracture, evidence of discitis, or bone lesion. Cord: No abnormal cord signal. Posterior Fossa, vertebral arteries, paraspinal tissues: Extensive sinus disease within the visible maxillary and sphenoid sinuses. Disc levels: C2-3: No significant disc displacement, foraminal stenosis, or canal stenosis. Mild facet hypertrophy. C3-4: Disc osteophyte complex with bilateral uncovertebral and facet hypertrophy. Mild foraminal and canal stenosis. C4-5: Disc osteophyte complex with bilateral uncovertebral and facet hypertrophy. Mild foraminal and canal stenosis. C5-6: Disc osteophyte complex with left-greater-than-right bilateral uncovertebral and facet hypertrophy. Mild left foraminal stenosis. No canal stenosis. C6-7: No significant disc displacement, foraminal stenosis, or canal stenosis. C7-T1: No significant disc displacement, foraminal stenosis, or canal stenosis. IMPRESSION: 1. No acute osseous abnormality or abnormal cord signal. 2. Mild cervical spondylosis greatest at the C3-4 and C4-5 levels. 3. Mild multifactorial C3-4 and C4-5 canal stenosis. No high-grade canal stenosis. 4. Mild bilateral C3-4, bilateral C4-5, and left C5-6 foraminal stenosis. No high-grade foraminal stenosis. 5. Extensive sinus disease within visible maxillary and sphenoid sinuses. Electronically Signed   By: Kristine Garbe M.D.   On: 11/10/2017 17:29   Mr Thoracic Spine Wo Contrast  Result Date: 11/11/2017 CLINICAL DATA:  Type 2 diabetes. Neuropathy. Chronic kidney disease. Fluid overload and cephalopathy. Weakness. EXAM: MRI THORACIC SPINE WITHOUT CONTRAST TECHNIQUE: Multiplanar, multisequence MR imaging of the thoracic spine was performed. No intravenous contrast was administered. COMPARISON:  Radiography 10/30/2017 FINDINGS: Alignment: No significant malalignment. Mild curvature convex to the right. Vertebrae: No fracture or primary bone lesion. Cord:  No cord compression or  primary cord lesion. Paraspinal and other soft tissues: Moderate to large bilateral effusions layering dependently. Disc levels: Non-compressive disc bulges throughout the thoracic region. Small right paracentral disc herniation at T6-7. Small central disc herniation at T8-9. No compressive effect upon the cord. No foraminal extension. Mild lower thoracic facet and ligamentous hypertrophy. IMPRESSION: No likely significant spinal finding. Minor changes of degenerative disc disease and minor thoracic curvature. No compressive stenosis of the canal or neural foramina. Bilateral pleural effusions layering dependently. Electronically Signed   By: Nelson Chimes M.D.   On: 11/11/2017 21:33   Mr Lumbar Spine Wo Contrast  Result Date: 11/10/2017 CLINICAL DATA:  57 y/o F; evaluation of lower extremity weakness. History of diabetes, neuropathy, retinopathy, hypertension, and chronic kidney disease. EXAM: MRI LUMBAR SPINE WITHOUT CONTRAST TECHNIQUE: Multiplanar, multisequence MR imaging of the lumbar spine was performed. No intravenous contrast was administered. COMPARISON:  Concurrent cervical spine MRI. FINDINGS: Motion degraded study. Segmentation:  Standard. Alignment:  Physiologic. Vertebrae:  No fracture, evidence of discitis, or bone lesion. Conus medullaris and cauda equina: Conus extends to the L1 level. On the sagittal T2 weighted sequence there is increased signal within the conus medullaris without appreciable expansion. Paraspinal and other soft tissues: Negative. Disc levels: L1-2: No significant disc displacement, foraminal stenosis, or canal stenosis. L2-3: No significant disc displacement, foraminal stenosis, or canal stenosis. L3-4: Small disc bulge. No significant foraminal or canal stenosis. L4-5: Small disc bulge and mild facet hypertrophy. Mild bilateral foraminal and canal stenosis. L5-S1: No significant disc displacement, foraminal stenosis, or canal stenosis. IMPRESSION: 1. Increased signal within  the  conus medullaris on sagittal sequences may represent myelitis or edema from lower cord lesion, incompletely visualized. Thoracic spine MRI is recommended to further evaluate. 2. Mild lumbar spondylosis with predominantly discogenic degenerative changes at the L3-4 and L4-5 levels. 3. Mild L4-5 foraminal and canal stenosis.  No high-grade stenosis. These results will be called to the ordering clinician or representative by the Radiologist Assistant, and communication documented in the PACS or zVision Dashboard. Electronically Signed   By: Kristine Garbe M.D.   On: 11/10/2017 17:40   Scheduled Meds: . acetaminophen  650 mg Oral Once  . bisacodyl  10 mg Rectal Q0600  . clopidogrel  75 mg Oral Daily  . collagenase   Topical Daily  . enoxaparin (LOVENOX) injection  30 mg Subcutaneous Q24H  . ezetimibe  10 mg Oral Daily  . feeding supplement (NEPRO CARB STEADY)  237 mL Oral TID WC  . feeding supplement (PRO-STAT SUGAR FREE 64)  30 mL Oral BID  . furosemide  20 mg Intravenous Once  . Gerhardt's butt cream   Topical QID  . insulin aspart  0-9 Units Subcutaneous TID WC  . isosorbide mononitrate  90 mg Oral Daily  . metoprolol tartrate  100 mg Oral BID  . mirtazapine  15 mg Oral QHS  . multivitamin  1 tablet Oral QHS  . NIFEdipine  60 mg Oral BID  . nystatin cream   Topical BID  . pantoprazole  40 mg Oral Q0600  . venlafaxine XR  150 mg Oral Q breakfast   Continuous Infusions: . sodium chloride     PRN Meds:.acetaminophen, alum & mag hydroxide-simeth, diphenhydrAMINE, guaiFENesin-dextromethorphan, lip balm, methocarbamol, polyethylene glycol, prochlorperazine **OR** prochlorperazine **OR** prochlorperazine, sodium phosphate, traMADol, traZODone   ASSESMENT:   *  Progressive, normocytic anemia.  FOBT + and passed small clot of blood after BM 5 to 6 days ago.   Likely component of anemia of chronic disease.   Hgb down from 7.7 >> 6.9 in last 24 hours; down from post IV hydration  level of 8.5 on 1/27.     *  On inpt rehab with multiple recent problems including acute disseminated encephalomyelitis, recent nephrotic syndrome, CKD/AKI, CAD, CHF, malnutrition, Cdiff colitis, competed 10 days oral vanc on 2/6.  *   Chronic Plavix.  No need to hold for endoscopic procedures.      PLAN   *  Timing of colonoscopy +/- EGD per Dr Havery Moros.      Azucena Freed  11/12/2017, 10:42 AM Pager: [phone]

## 2017-11-12 NOTE — Progress Notes (Signed)
Patient vitals checked,  Sat 87% patient lethargic,  Patient placed on 2LNC sat up to 100% PA Dan A. Notified no further orders will continue to monitor.

## 2017-11-12 NOTE — Progress Notes (Signed)
Subjective: No significant change overnight.  Exam: Vitals:   11/12/17 0943 11/12/17 0946  BP: 133/67   Pulse: 77   Resp:    Temp:    SpO2: 100% 98%    Physical Exam   HEENT-  Normocephalic, no lesions, without obvious abnormality.  Normal external eye and conjunctiva.   Cardiovascular- S1-S2 audible, pulses palpable throughout   Lungs-no rhonchi or wheezing noted, no excessive working breathing.  Saturations within normal limits Abdomen- All 4 quadrants palpated and nontender Extremities- Warm, dry and intact Musculoskeletal-no joint tenderness, deformity or swelling Skin-warm and dry, no hyperpigmentation, vitiligo, or suspicious lesions    Neuro:  Mental Status: Alert, oriented to month, but not year or place, thought content appropriate.  Speech fluent without evidence of aphasia.  Able to follow 3 step commands without difficulty. Cranial Nerves: II: Visual fields grossly normal,  III,IV, VI: ptosis not present, extra-ocular motions intact bilaterally pupils equal, round, reactive to light and accommodation V,VII: smile symmetric, facial light touch sensation normal bilaterally VIII: hearing normal bilaterally IX,X: uvula rises symmetrically XI: bilateral shoulder shrug XII: midline tongue extension Motor: Right :  Upper extremity                                              Left:     Upper extremity 4/5 deltoid                                                                   4/5 deltoid 3/5 tricep                                                                     3/5 tricep 4/5 biceps                                                                    4/5 biceps  5/5wrist flexion                                                            5/5 wrist flexion 5/5 wrist extension                                                      5/5 wrist extension 5/5 hand grip  5/5 hand grip             Lower extremity                                                           Lower extremity 3/5 hip flexor                                                               3/5 hip flexor 2/5 hip adductors                                                        2/5 hip adductors 2/5 hip abductors                                                        2/5 hip abductors 1/5 quadricep                                                              4/5 quadriceps  1/5 hamstrings                                                            1/5 hamstrings 4/5 plantar flexion                                                       4/5 plantar flexion 0/5 plantar extension                                                  3/5 plantar extension  Sensory: Pinprick and light touch intact throughout, bilaterally UE ut in her LE she has no proprioception bilaterally, vibration at her ankle and temperature is impaired up to knee.  Deep Tendon Reflexes: 0 and symmetric throughout Plantars: Mute bilaterally Cerebellar: dysmetric finger-to-nose due to strength  Gait: not tested       Medications:  Scheduled: . acetaminophen  650 mg Oral Once  . bisacodyl  10 mg Rectal Q0600  . clopidogrel  75 mg Oral Daily  . collagenase   Topical  Daily  . enoxaparin (LOVENOX) injection  30 mg Subcutaneous Q24H  . ezetimibe  10 mg Oral Daily  . feeding supplement (NEPRO CARB STEADY)  237 mL Oral TID WC  . feeding supplement (PRO-STAT SUGAR FREE 64)  30 mL Oral BID  . furosemide  20 mg Intravenous Once  . Gerhardt's butt cream   Topical QID  . insulin aspart  0-9 Units Subcutaneous TID WC  . isosorbide mononitrate  90 mg Oral Daily  . metoprolol tartrate  100 mg Oral BID  . mirtazapine  15 mg Oral QHS  . multivitamin  1 tablet Oral QHS  . NIFEdipine  60 mg Oral BID  . nystatin cream   Topical BID  . pantoprazole  40 mg Oral Q0600  . venlafaxine XR  150 mg Oral Q breakfast    Pertinent Labs/Diagnostics:   Neurology Annice Needy from [facility] did a full work up: Lumbar puncture performed showed WBC 2, RBC 2, Prot 85, and Gluc 75. Culture/Gram Stain Negative. CMV, EBV, HSV Neg. The elevation of the CSF protein is nonspecific but may indicate a degree of inflammation. MS Profile is returned but not available for review (being scanned into medical record as of 12/31).   ANA Neg,  SS-A/SS-B Neg,  RF Neg,  Lupus Inhibitor Neg,  RPR Neg,  Heavy Metal Screen Neg,  TPO Neg,  CRP WNL.  Serum paraneoplastic panel [facility]) is negative.   At that time her Ddx: Weakness/Encephalopathy most likely 2/2 osmotic demyelination in the setting of Nephrotic Syndrome 2/2 diabetic nephropathy         Mr Brain Wo Contrast  Result Date: 11/11/2017 No new or progressive findings. Evolutionary changes of the multiple foci of T2 signal and restricted diffusion within the deep brain as outlined above. Foci show abnormal T2 signal presently with T2 shine through but no true restricted diffusion. No new lesions. Presumably, this was toxic or metabolic demyelination which has run its course. Certainly, the patient could have lingering neurologic sequelae due to the underlying damage in the areas of involvement. Electronically Signed   By: Nelson Chimes M.D.   On: 11/11/2017 21:40   Mr Cervical Spine Wo Contrast  Result Date: 11/10/2017  IMPRESSION: 1. No acute osseous abnormality or abnormal cord signal. 2. Mild cervical spondylosis greatest at the C3-4 and C4-5 levels. 3. Mild multifactorial C3-4 and C4-5 canal stenosis. No high-grade canal stenosis. 4. Mild bilateral C3-4, bilateral C4-5, and left C5-6 foraminal stenosis. No high-grade foraminal stenosis. 5. Extensive sinus disease within visible maxillary and sphenoid sinuses. Electronically Signed   By: Kristine Garbe M.D.   On: 11/10/2017 17:29   Mr Thoracic Spine Wo Contrast  Result Date: 11/11/2017 . IMPRESSION: No likely significant spinal finding. Minor changes of  degenerative disc disease and minor thoracic curvature. No compressive stenosis of the canal or neural foramina. Bilateral pleural effusions layering dependently. Electronically Signed   By: Nelson Chimes M.D.   On: 11/11/2017 21:33       Etta Quill PA-C [facility] [phone]  I have seen the patient reviewed the above note.  Impression:  57 year old female with severe T2 change/restricted diffusion in the white matter including middle cerebellar peduncle in the setting of severe nephrotic syndrome, diabetes, weight loss.  Marchiafava-Bignami disease would be another consideration especially given the degree of her corpus callosum involvement.  It is also possible that there may be a component of demyelinating neuropathy given her decreased lower extremity reflexes and weakness  which is seen with nephrotic syndrome as well.  Her autoimmune workup was negative and I think that this is less likely culprit.  I think the mainstay of treatment at this point is going to be supportive.  No significant change in the repeat of MRI and thoracic spine.  There is no change in the conus, suggesting that the change seen on the lumbar MRI was actually artifactual.  Recommendations: 1) continue vitamin supplementation 2) continue with physical and occupational therapies 3) no further recommendations at this time, please call with further questions or concerns.  Roland Rack, MD [facility] [phone]  If 7pm- 7am, please page neurology on call as listed in [place].   11/12/2017, 12:23 PM

## 2017-11-12 NOTE — Consult Note (Addendum)
Reason for Consult: Rising BUN in patient with chronic kidney disease stage IV Referring Physician: Delice Lesch M.D. ([facility])  HPI: (Patient able to provide very limited history possibly from cognitive impairment and the following is obtained from her chart) 57 year old woman of [place] origin with past medical history significant for type 2 diabetes mellitus complicated by neuropathy/retinopathy and likely nephropathy, hypertension, congestive heart failure and chronic kidney disease stage IV following recent admission to the hospital [facility]) with nephrotic syndrome/CHF exacerbation complicated by acute on chronic renal failure that required transient dialysis support.   She was admitted to this hospital on 10/30/17 with decreasing functional status and hypoglycemia/hypothermia raising concerns for sepsis initially suspected to be from urinary tract infection and found to have Clostridium difficile infection. 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 Concern is raised with rising BUN in the setting of suspected volume depletion and ongoing GI bleeding evaluation for recent hematochezia and positive FOBT.  When I saw her, she denied any chest pain or shortness of breath and does not have any abdominal pain. She denies any nausea or vomiting and states that she has poor appetite. She has an indwelling Foley catheter.     11/02/2017  11/05/2017  11/06/2017  11/09/2017  11/10/2017  11/12/2017   BUN 48 (H) 52 (H) 64 (H) 78 (H) 80 (H) 82 (H)  Creatinine 2.44 (H) 2.05 (H) 2.26 (H) 2.51 (H) 2.39 (H) 2.58 (H)    Past Medical History:  Diagnosis Date  . CHF (congestive heart failure) ([place])   . Chronic bronchitis ([place])   . High cholesterol   . Hypertension   . Type II diabetes mellitus ([place])     Past Surgical History:  Procedure Laterality Date  . CESAREAN SECTION    . TUBAL LIGATION       Family History  Problem Relation Age of Onset  . Diabetes Father     Social History:  reports that  has never smoked. she has never used smokeless tobacco. She reports that she does not drink alcohol or use drugs.  Allergies:  Allergies  Allergen Reactions  . Aspirin Hives  . Gabapentin     Excessive sedation (slept for 2 days)  . Statins Other (See Comments)    Joint pain    Medications:  Scheduled: . acetaminophen  650 mg Oral Once  . bisacodyl  10 mg Rectal Q0600  . clopidogrel  75 mg Oral Daily  . collagenase   Topical Daily  . enoxaparin (LOVENOX) injection  30 mg Subcutaneous Q24H  . ezetimibe  10 mg Oral Daily  . feeding supplement (NEPRO CARB STEADY)  237 mL Oral TID WC  . feeding supplement (PRO-STAT SUGAR FREE 64)  30 mL Oral BID  . furosemide  20 mg Intravenous Once  . Gerhardt's butt cream   Topical QID  . insulin aspart  0-9 Units Subcutaneous TID WC  . isosorbide mononitrate  90 mg Oral Daily  . metoprolol tartrate  100 mg Oral BID  . mirtazapine  15 mg Oral QHS  . multivitamin  1 tablet Oral QHS  . NIFEdipine  60 mg Oral BID  . nystatin cream   Topical BID  . pantoprazole  40 mg Oral Q0600  . venlafaxine XR  150 mg Oral Q breakfast    BMP Latest Ref Rng & Units 11/12/2017 11/10/2017 11/09/2017  Glucose 65 - 99 mg/dL  96 122(H) 99  BUN 6 - 20 mg/dL 82(H) 80(H) 78(H)  Creatinine 0.44 - 1.00 mg/dL 2.58(H) 2.39(H) 2.51(H)  Sodium 135 - 145 mmol/L 140 141 139  Potassium 3.5 - 5.1 mmol/L 5.0 5.1 5.3(H)  Chloride 101 - 111 mmol/L 110 110 109  CO2 22 - 32 mmol/L 20(L) 19(L) 20(L)  Calcium 8.9 - 10.3 mg/dL 8.2(L) 8.1(L) 8.1(L)   CBC Latest Ref Rng & Units 11/12/2017 11/10/2017 11/09/2017  WBC 4.0 - 10.5 K/uL 7.9 10.2 10.7(H)  Hemoglobin 12.0 - 15.0 g/dL 6.9(LL) 7.7(L) 7.4(L)  Hematocrit 36.0 - 46.0 % 21.9(L) 23.8(L) 22.8(L)  Platelets 150 - 400 K/uL 475(H) 472(H) 482(H)     Mr Brain Wo Contrast  Result Date: 11/11/2017 CLINICAL DATA:  Type 2 diabetes.   Encephalopathy.  Weakness. EXAM: MRI HEAD WITHOUT CONTRAST TECHNIQUE: Multiplanar, multiecho pulse sequences of the brain and surrounding structures were obtained without intravenous contrast. COMPARISON:  09/16/2017 MRI.  09/08/2017 CT. FINDINGS: Brain: There is no new or worsening finding. On the previous study, the patient had multiple acute foci of restricted diffusion within the brain in a distribution suggesting acute toxic or metabolic demyelination. This was most prominent within the middle cerebellar peduncles, the corpus callosum and the hemispheric white matter. Today, there is mild lingering T2 shine through signal in those regions. No new foci of restricted diffusion. No cortical or large vessel territory abnormality. No sign of mass lesion, hemorrhage, hydrocephalus or extra-axial collection. Vascular: Major vessels at the base of the brain show flow. Skull and upper cervical spine: Negative Sinuses/Orbits: Inflammatory changes of the paranasal sinuses on the right. Other: None IMPRESSION: No new or progressive findings. Evolutionary changes of the multiple foci of T2 signal and restricted diffusion within the deep brain as outlined above. Foci show abnormal T2 signal presently with T2 shine through but no true restricted diffusion. No new lesions. Presumably, this was toxic or metabolic demyelination which has run its course. Certainly, the patient could have lingering neurologic sequelae due to the underlying damage in the areas of involvement. Electronically Signed   By: Nelson Chimes M.D.   On: 11/11/2017 21:40   Mr Cervical Spine Wo Contrast  Result Date: 11/10/2017 CLINICAL DATA:  57 y/o F; patient presenting with lower extremity weakness. History of diabetes, neuropathy, retinopathy, hypertension, chronic kidney disease. EXAM: MRI CERVICAL SPINE WITHOUT CONTRAST TECHNIQUE: Multiplanar, multisequence MR imaging of the cervical spine was performed. No intravenous contrast was administered.  COMPARISON:  None. FINDINGS: Alignment: Straightening of cervical lordosis.  No listhesis. Vertebrae: No fracture, evidence of discitis, or bone lesion. Cord: No abnormal cord signal. Posterior Fossa, vertebral arteries, paraspinal tissues: Extensive sinus disease within the visible maxillary and sphenoid sinuses. Disc levels: C2-3: No significant disc displacement, foraminal stenosis, or canal stenosis. Mild facet hypertrophy. C3-4: Disc osteophyte complex with bilateral uncovertebral and facet hypertrophy. Mild foraminal and canal stenosis. C4-5: Disc osteophyte complex with bilateral uncovertebral and facet hypertrophy. Mild foraminal and canal stenosis. C5-6: Disc osteophyte complex with left-greater-than-right bilateral uncovertebral and facet hypertrophy. Mild left foraminal stenosis. No canal stenosis. C6-7: No significant disc displacement, foraminal stenosis, or canal stenosis. C7-T1: No significant disc displacement, foraminal stenosis, or canal stenosis. IMPRESSION: 1. No acute osseous abnormality or abnormal cord signal. 2. Mild cervical spondylosis greatest at the C3-4 and C4-5 levels. 3. Mild multifactorial C3-4 and C4-5 canal stenosis. No high-grade canal stenosis. 4. Mild bilateral C3-4, bilateral C4-5, and left C5-6 foraminal stenosis. No high-grade foraminal stenosis. 5. Extensive sinus disease within visible  maxillary and sphenoid sinuses. Electronically Signed   By: Kristine Garbe M.D.   On: 11/10/2017 17:29   Mr Thoracic Spine Wo Contrast  Result Date: 11/11/2017 CLINICAL DATA:  Type 2 diabetes. Neuropathy. Chronic kidney disease. Fluid overload and cephalopathy. Weakness. EXAM: MRI THORACIC SPINE WITHOUT CONTRAST TECHNIQUE: Multiplanar, multisequence MR imaging of the thoracic spine was performed. No intravenous contrast was administered. COMPARISON:  Radiography 10/30/2017 FINDINGS: Alignment: No significant malalignment. Mild curvature convex to the right. Vertebrae: No fracture  or primary bone lesion. Cord:  No cord compression or primary cord lesion. Paraspinal and other soft tissues: Moderate to large bilateral effusions layering dependently. Disc levels: Non-compressive disc bulges throughout the thoracic region. Small right paracentral disc herniation at T6-7. Small central disc herniation at T8-9. No compressive effect upon the cord. No foraminal extension. Mild lower thoracic facet and ligamentous hypertrophy. IMPRESSION: No likely significant spinal finding. Minor changes of degenerative disc disease and minor thoracic curvature. No compressive stenosis of the canal or neural foramina. Bilateral pleural effusions layering dependently. Electronically Signed   By: Nelson Chimes M.D.   On: 11/11/2017 21:33   Mr Lumbar Spine Wo Contrast  Result Date: 11/10/2017 CLINICAL DATA:  57 y/o F; evaluation of lower extremity weakness. History of diabetes, neuropathy, retinopathy, hypertension, and chronic kidney disease. EXAM: MRI LUMBAR SPINE WITHOUT CONTRAST TECHNIQUE: Multiplanar, multisequence MR imaging of the lumbar spine was performed. No intravenous contrast was administered. COMPARISON:  Concurrent cervical spine MRI. FINDINGS: Motion degraded study. Segmentation:  Standard. Alignment:  Physiologic. Vertebrae:  No fracture, evidence of discitis, or bone lesion. Conus medullaris and cauda equina: Conus extends to the L1 level. On the sagittal T2 weighted sequence there is increased signal within the conus medullaris without appreciable expansion. Paraspinal and other soft tissues: Negative. Disc levels: L1-2: No significant disc displacement, foraminal stenosis, or canal stenosis. L2-3: No significant disc displacement, foraminal stenosis, or canal stenosis. L3-4: Small disc bulge. No significant foraminal or canal stenosis. L4-5: Small disc bulge and mild facet hypertrophy. Mild bilateral foraminal and canal stenosis. L5-S1: No significant disc displacement, foraminal stenosis, or  canal stenosis. IMPRESSION: 1. Increased signal within the conus medullaris on sagittal sequences may represent myelitis or edema from lower cord lesion, incompletely visualized. Thoracic spine MRI is recommended to further evaluate. 2. Mild lumbar spondylosis with predominantly discogenic degenerative changes at the L3-4 and L4-5 levels. 3. Mild L4-5 foraminal and canal stenosis.  No high-grade stenosis. These results will be called to the ordering clinician or representative by the Radiologist Assistant, and communication documented in the PACS or zVision Dashboard. Electronically Signed   By: Kristine Garbe M.D.   On: 11/10/2017 17:40    Review of Systems  Reason unable to perform ROS: Obtained with difficulty.  Constitutional: Positive for malaise/fatigue. Negative for chills and fever.  Respiratory: Negative for cough and shortness of breath.   Cardiovascular: Negative for chest pain and orthopnea.  Gastrointestinal: Negative for abdominal pain, diarrhea, nausea and vomiting.  Genitourinary: Negative.   Musculoskeletal: Positive for back pain.  Skin: Negative.    Blood pressure 133/67, pulse 77, temperature 98 F (36.7 C), temperature source Axillary, resp. rate 16, height 4\' 11"  (1.499 m), weight 56.9 kg (125 lb 7.1 oz), SpO2 98 %. Physical Exam  Nursing note and vitals reviewed. Constitutional: No distress.  Appears chronically ill  HENT:  Head: Normocephalic and atraumatic.  Nose: Nose normal.  Mouth/Throat: No oropharyngeal exudate.  Eyes: EOM are normal. Pupils are equal, round, and reactive to  light. No scleral icterus.  Neck: Normal range of motion. Neck supple. No JVD present.  Cardiovascular: Normal rate and regular rhythm.  Murmur heard. Ejection systolic murmur over apex  Respiratory: She has no wheezes. She has no rales.  Poor inspiratory effort with decreased breath sounds over bases  GI: Soft. Bowel sounds are normal. There is no tenderness. There is no  rebound and no guarding.  Musculoskeletal: She exhibits edema.  Trace edema over both ankles/back  Neurological: She is alert.  Skin: Skin is warm and dry. No rash noted. There is pallor.    Assessment/Plan: 1. Chronic kidney disease stage IV with rising BUN: I suspect that this is bifactorial from suboptimal oral intake and possible gastrointestinal bleed/increased protein load to proximal GI tract. Blood transfusion has been ordered for and I would be very interested in seeing her follow-up labs. At this time, I will order some urine electrolytes and repeat urinalysis to assess intravascular volume status. She has minimal lower extremity edema with flat jugular veins and no discernible rales on lung exam. She does have a history of congestive heart failure and place appropriate caution regarding intravenous fluids in her setting.  2. Anemia: Significant iron deficiency noted on today's labs with evidence and ongoing evaluation of GI bleeding. Possible endoscopy/colonoscopy anticipated based on review of gastroenterology notes. 3. Hyperkalemia: Mild, will treat with a single dose of [place] and consider increasing diuretic dose if urine electrolytes allow.  4. Clostridium difficile infection: Status post completion of oral vancomycin, remains on enteric precautions. 5. Hypertension: Blood pressure medications reviewed-appears to have had improvement of blood pressure control following recent up titration of dosing. 6. 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   Valon Glasscock K. 11/12/2017, 1:37 PM

## 2017-11-12 NOTE — Progress Notes (Signed)
Occupational Therapy Session Note  Patient Details  Name: Laura [name] MRN: [id number] Date of Birth: 08-Feb-1961  Today's Date: 11/12/2017 OT Individual Time: 6226-3335 OT Individual Time Calculation (min): 40 min    Short Term Goals: Week 1:  OT Short Term Goal 1 (Week 1): Pt will completed UB dressing with minA  OT Short Term Goal 2 (Week 1): Pt will complete LB dressing with modA OT Short Term Goal 3 (Week 1): Pt will perform shower transfer with mod A  OT Short Term Goal 4 (Week 1): Pt will particpiate in OOB activities for 30 mins OT Short Term Goal 5 (Week 1): Pt will complete grooming at sink side with min A   Skilled Therapeutic Interventions/Progress Updates:    Engaged in bed level therapies secondary to pt receiving blood transfusion.  Engaged in bed mobility with rolling and bridging as needed to increase positioning as well as to decrease burden of care with LB dressing.  Pt with heavy reliance on bed rails during mobility, requiring tactile cues and hips and feet to increase pushing through BLE.  Engaged in [place] therex in supine with focus on BUE strengthening against gravity completing 2 sets of 15 each exercise. Discussed introduction of weight and resistance with pt willing to attempt during future session.  Therapy Documentation Precautions:  Precautions Precautions: Fall Precaution Comments: monitor blood pressure Restrictions Weight Bearing Restrictions: No General: General OT Amount of Missed Time: 60 Minutes Vital Signs: Therapy Vitals Temp: 97.8 F (36.6 C) Temp Source: Oral Pulse Rate: 66 Resp: 18 BP: 129/65 Patient Position (if appropriate): Lying Oxygen Therapy SpO2: 97 % O2 Device: Nasal Cannula O2 Flow Rate (L/min): 1 L/min Pain:  Pt with no c/o pain  See Function Navigator for Current Functional Status.   Therapy/Group: Individual Therapy  [name] 11/12/2017, 3:50 PM

## 2017-11-12 NOTE — Progress Notes (Signed)
Subjective/Complaints: Patient seen lying in bed this morning. Per nursing she is lethargic. On exam she is lethargic as well. She does complain of back pain. Yesterday she was seen by neurology and gastroenterology due to drop in hemoglobin. She also received tramadol and Ativan.  Review of systems: Unreliable due to cognition  Objective: Vital Signs: Blood pressure 133/67, pulse 77, temperature 98 F (36.7 C), temperature source Axillary, resp. rate 16, height 4' 11"  (1.499 m), weight 56.9 kg (125 lb 7.1 oz), SpO2 98 %. Mr Brain Wo Contrast  Result Date: 11/11/2017 CLINICAL DATA:  Type 2 diabetes.  Encephalopathy.  Weakness. EXAM: MRI HEAD WITHOUT CONTRAST TECHNIQUE: Multiplanar, multiecho pulse sequences of the brain and surrounding structures were obtained without intravenous contrast. COMPARISON:  09/16/2017 MRI.  09/08/2017 CT. FINDINGS: Brain: There is no new or worsening finding. On the previous study, the patient had multiple acute foci of restricted diffusion within the brain in a distribution suggesting acute toxic or metabolic demyelination. This was most prominent within the middle cerebellar peduncles, the corpus callosum and the hemispheric white matter. Today, there is mild lingering T2 shine through signal in those regions. No new foci of restricted diffusion. No cortical or large vessel territory abnormality. No sign of mass lesion, hemorrhage, hydrocephalus or extra-axial collection. Vascular: Major vessels at the base of the brain show flow. Skull and upper cervical spine: Negative Sinuses/Orbits: Inflammatory changes of the paranasal sinuses on the right. Other: None IMPRESSION: No new or progressive findings. Evolutionary changes of the multiple foci of T2 signal and restricted diffusion within the deep brain as outlined above. Foci show abnormal T2 signal presently with T2 shine through but no true restricted diffusion. No new lesions. Presumably, this was toxic or metabolic  demyelination which has run its course. Certainly, the patient could have lingering neurologic sequelae due to the underlying damage in the areas of involvement. Electronically Signed   By: Nelson Chimes M.D.   On: 11/11/2017 21:40   Mr Cervical Spine Wo Contrast  Result Date: 11/10/2017 CLINICAL DATA:  57 y/o F; patient presenting with lower extremity weakness. History of diabetes, neuropathy, retinopathy, hypertension, chronic kidney disease. EXAM: MRI CERVICAL SPINE WITHOUT CONTRAST TECHNIQUE: Multiplanar, multisequence MR imaging of the cervical spine was performed. No intravenous contrast was administered. COMPARISON:  None. FINDINGS: Alignment: Straightening of cervical lordosis.  No listhesis. Vertebrae: No fracture, evidence of discitis, or bone lesion. Cord: No abnormal cord signal. Posterior Fossa, vertebral arteries, paraspinal tissues: Extensive sinus disease within the visible maxillary and sphenoid sinuses. Disc levels: C2-3: No significant disc displacement, foraminal stenosis, or canal stenosis. Mild facet hypertrophy. C3-4: Disc osteophyte complex with bilateral uncovertebral and facet hypertrophy. Mild foraminal and canal stenosis. C4-5: Disc osteophyte complex with bilateral uncovertebral and facet hypertrophy. Mild foraminal and canal stenosis. C5-6: Disc osteophyte complex with left-greater-than-right bilateral uncovertebral and facet hypertrophy. Mild left foraminal stenosis. No canal stenosis. C6-7: No significant disc displacement, foraminal stenosis, or canal stenosis. C7-T1: No significant disc displacement, foraminal stenosis, or canal stenosis. IMPRESSION: 1. No acute osseous abnormality or abnormal cord signal. 2. Mild cervical spondylosis greatest at the C3-4 and C4-5 levels. 3. Mild multifactorial C3-4 and C4-5 canal stenosis. No high-grade canal stenosis. 4. Mild bilateral C3-4, bilateral C4-5, and left C5-6 foraminal stenosis. No high-grade foraminal stenosis. 5. Extensive sinus  disease within visible maxillary and sphenoid sinuses. Electronically Signed   By: Kristine Garbe M.D.   On: 11/10/2017 17:29   Mr Thoracic Spine Wo Contrast  Result Date: 11/11/2017  CLINICAL DATA:  Type 2 diabetes. Neuropathy. Chronic kidney disease. Fluid overload and cephalopathy. Weakness. EXAM: MRI THORACIC SPINE WITHOUT CONTRAST TECHNIQUE: Multiplanar, multisequence MR imaging of the thoracic spine was performed. No intravenous contrast was administered. COMPARISON:  Radiography 10/30/2017 FINDINGS: Alignment: No significant malalignment. Mild curvature convex to the right. Vertebrae: No fracture or primary bone lesion. Cord:  No cord compression or primary cord lesion. Paraspinal and other soft tissues: Moderate to large bilateral effusions layering dependently. Disc levels: Non-compressive disc bulges throughout the thoracic region. Small right paracentral disc herniation at T6-7. Small central disc herniation at T8-9. No compressive effect upon the cord. No foraminal extension. Mild lower thoracic facet and ligamentous hypertrophy. IMPRESSION: No likely significant spinal finding. Minor changes of degenerative disc disease and minor thoracic curvature. No compressive stenosis of the canal or neural foramina. Bilateral pleural effusions layering dependently. Electronically Signed   By: Nelson Chimes M.D.   On: 11/11/2017 21:33   Mr Lumbar Spine Wo Contrast  Result Date: 11/10/2017 CLINICAL DATA:  57 y/o F; evaluation of lower extremity weakness. History of diabetes, neuropathy, retinopathy, hypertension, and chronic kidney disease. EXAM: MRI LUMBAR SPINE WITHOUT CONTRAST TECHNIQUE: Multiplanar, multisequence MR imaging of the lumbar spine was performed. No intravenous contrast was administered. COMPARISON:  Concurrent cervical spine MRI. FINDINGS: Motion degraded study. Segmentation:  Standard. Alignment:  Physiologic. Vertebrae:  No fracture, evidence of discitis, or bone lesion. Conus  medullaris and cauda equina: Conus extends to the L1 level. On the sagittal T2 weighted sequence there is increased signal within the conus medullaris without appreciable expansion. Paraspinal and other soft tissues: Negative. Disc levels: L1-2: No significant disc displacement, foraminal stenosis, or canal stenosis. L2-3: No significant disc displacement, foraminal stenosis, or canal stenosis. L3-4: Small disc bulge. No significant foraminal or canal stenosis. L4-5: Small disc bulge and mild facet hypertrophy. Mild bilateral foraminal and canal stenosis. L5-S1: No significant disc displacement, foraminal stenosis, or canal stenosis. IMPRESSION: 1. Increased signal within the conus medullaris on sagittal sequences may represent myelitis or edema from lower cord lesion, incompletely visualized. Thoracic spine MRI is recommended to further evaluate. 2. Mild lumbar spondylosis with predominantly discogenic degenerative changes at the L3-4 and L4-5 levels. 3. Mild L4-5 foraminal and canal stenosis.  No high-grade stenosis. These results will be called to the ordering clinician or representative by the Radiologist Assistant, and communication documented in the PACS or zVision Dashboard. Electronically Signed   By: Kristine Garbe M.D.   On: 11/10/2017 17:40   Results for orders placed or performed during the hospital encounter of 11/05/17 (from the past 72 hour(s))  Glucose, capillary     Status: Abnormal   Collection Time: 11/09/17  4:54 PM  Result Value Ref Range   Glucose-Capillary 142 (H) 65 - 99 mg/dL   Comment 1 Notify RN   Glucose, capillary     Status: Abnormal   Collection Time: 11/09/17  8:23 PM  Result Value Ref Range   Glucose-Capillary 170 (H) 65 - 99 mg/dL  Basic metabolic panel     Status: Abnormal   Collection Time: 11/10/17  6:47 AM  Result Value Ref Range   Sodium 141 135 - 145 mmol/L   Potassium 5.1 3.5 - 5.1 mmol/L   Chloride 110 101 - 111 mmol/L   CO2 19 (L) 22 - 32 mmol/L    Glucose, Bld 122 (H) 65 - 99 mg/dL   BUN 80 (H) 6 - 20 mg/dL   Creatinine, Ser 2.39 (H) 0.44 - 1.00  mg/dL   Calcium 8.1 (L) 8.9 - 10.3 mg/dL   GFR calc non Af Amer 22 (L) >60 mL/min   GFR calc Af Amer 25 (L) >60 mL/min    Comment: (NOTE) The eGFR has been calculated using the CKD EPI equation. This calculation has not been validated in all clinical situations. eGFR's persistently <60 mL/min signify possible Chronic Kidney Disease.    Anion gap 12 5 - 15    Comment: Performed at [facility] [address] [place], [place] [address]  CBC with Differential/Platelet     Status: Abnormal   Collection Time: 11/10/17  6:47 AM  Result Value Ref Range   WBC 10.2 4.0 - 10.5 K/uL   RBC 2.74 (L) 3.87 - 5.11 MIL/uL   Hemoglobin 7.7 (L) 12.0 - 15.0 g/dL   HCT 23.8 (L) 36.0 - 46.0 %   MCV 86.9 78.0 - 100.0 fL   MCH 28.1 26.0 - 34.0 pg   MCHC 32.4 30.0 - 36.0 g/dL   RDW 17.7 (H) 11.5 - 15.5 %   Platelets 472 (H) 150 - 400 K/uL   Neutrophils Relative % 59 %   Neutro Abs 6.2 1.7 - 7.7 K/uL   Lymphocytes Relative 32 %   Lymphs Abs 3.2 0.7 - 4.0 K/uL   Monocytes Relative 5 %   Monocytes Absolute 0.5 0.1 - 1.0 K/uL   Eosinophils Relative 3 %   Eosinophils Absolute 0.3 0.0 - 0.7 K/uL   Basophils Relative 1 %   Basophils Absolute 0.1 0.0 - 0.1 K/uL    Comment: Performed at [facility], [address] [place], [place] [address]  Glucose, capillary     Status: Abnormal   Collection Time: 11/10/17  6:54 AM  Result Value Ref Range   Glucose-Capillary 115 (H) 65 - 99 mg/dL  Glucose, capillary     Status: Abnormal   Collection Time: 11/10/17 11:46 AM  Result Value Ref Range   Glucose-Capillary 183 (H) 65 - 99 mg/dL  Glucose, capillary     Status: Abnormal   Collection Time: 11/10/17  6:06 PM  Result Value Ref Range   Glucose-Capillary 123 (H) 65 - 99 mg/dL  Glucose, capillary     Status: Abnormal   Collection Time: 11/10/17  8:37 PM  Result Value Ref Range    Glucose-Capillary 126 (H) 65 - 99 mg/dL  Glucose, capillary     Status: Abnormal   Collection Time: 11/11/17  6:38 AM  Result Value Ref Range   Glucose-Capillary 105 (H) 65 - 99 mg/dL  Glucose, capillary     Status: Abnormal   Collection Time: 11/11/17 11:32 AM  Result Value Ref Range   Glucose-Capillary 126 (H) 65 - 99 mg/dL  Glucose, capillary     Status: Abnormal   Collection Time: 11/11/17  4:34 PM  Result Value Ref Range   Glucose-Capillary 112 (H) 65 - 99 mg/dL  Glucose, capillary     Status: None   Collection Time: 11/12/17 12:21 AM  Result Value Ref Range   Glucose-Capillary 85 65 - 99 mg/dL  Basic metabolic panel     Status: Abnormal   Collection Time: 11/12/17  5:15 AM  Result Value Ref Range   Sodium 140 135 - 145 mmol/L   Potassium 5.0 3.5 - 5.1 mmol/L   Chloride 110 101 - 111 mmol/L   CO2 20 (L) 22 - 32 mmol/L   Glucose, Bld 96 65 - 99 mg/dL   BUN 82 (H) 6 -  20 mg/dL   Creatinine, Ser 2.58 (H) 0.44 - 1.00 mg/dL   Calcium 8.2 (L) 8.9 - 10.3 mg/dL   GFR calc non Af Amer 20 (L) >60 mL/min   GFR calc Af Amer 23 (L) >60 mL/min    Comment: (NOTE) The eGFR has been calculated using the CKD EPI equation. This calculation has not been validated in all clinical situations. eGFR's persistently <60 mL/min signify possible Chronic Kidney Disease.    Anion gap 10 5 - 15    Comment: Performed at [facility] [address], [place], [place] [address]  CBC with Differential/Platelet     Status: Abnormal   Collection Time: 11/12/17  5:15 AM  Result Value Ref Range   WBC 7.9 4.0 - 10.5 K/uL   RBC 2.51 (L) 3.87 - 5.11 MIL/uL   Hemoglobin 6.9 (LL) 12.0 - 15.0 g/dL    Comment: REPEATED TO VERIFY CRITICAL RESULT CALLED TO, READ BACK BY AND VERIFIED WITH: F. ODEMELAM,RN [id number] 11/12/2017 T. TYSOR    HCT 21.9 (L) 36.0 - 46.0 %   MCV 87.3 78.0 - 100.0 fL   MCH 27.5 26.0 - 34.0 pg   MCHC 31.5 30.0 - 36.0 g/dL   RDW 17.1 (H) 11.5 - 15.5 %   Platelets 475 (H) 150 - 400 K/uL    Neutrophils Relative % 49 %   Neutro Abs 4.0 1.7 - 7.7 K/uL   Lymphocytes Relative 39 %   Lymphs Abs 3.1 0.7 - 4.0 K/uL   Monocytes Relative 6 %   Monocytes Absolute 0.5 0.1 - 1.0 K/uL   Eosinophils Relative 5 %   Eosinophils Absolute 0.4 0.0 - 0.7 K/uL   Basophils Relative 1 %   Basophils Absolute 0.1 0.0 - 0.1 K/uL    Comment: Performed at [facility] [address], [place], [place] [address]  Iron and TIBC     Status: Abnormal   Collection Time: 11/12/17  5:15 AM  Result Value Ref Range   Iron 25 (L) 28 - 170 ug/dL   TIBC 172 (L) 250 - 450 ug/dL   Saturation Ratios 15 10.4 - 31.8 %   UIBC 147 ug/dL    Comment: Performed at [facility], [place] [address], [place], [place] [address]  Ferritin     Status: None   Collection Time: 11/12/17  5:15 AM  Result Value Ref Range   Ferritin 282 11 - 307 ng/mL    Comment: Performed at [facility], [place] [address] [place], [place] [address]  Vitamin B12     Status: None   Collection Time: 11/12/17  5:15 AM  Result Value Ref Range   Vitamin B-12 870 180 - 914 pg/mL    Comment: (NOTE) This assay is not validated for testing neonatal or myeloproliferative syndrome specimens for Vitamin B12 levels. Performed at [facility], [place] [address] [place], [place] [address]   Glucose, capillary     Status: None   Collection Time: 11/12/17  7:01 AM  Result Value Ref Range   Glucose-Capillary 85 65 - 99 mg/dL  Type and screen [place]     Status: None (Preliminary result)   Collection Time: 11/12/17  7:10 AM  Result Value Ref Range   ABO/RH(D) AB POS    Antibody Screen NEG    Sample Expiration 11/15/2017    Unit Number [id number]    Blood Component Type RED CELLS,LR    Unit division 00  Status of Unit ALLOCATED    Transfusion Status OK TO TRANSFUSE    Crossmatch Result      Compatible Performed at [facility], [place] [address] [place], [place] [address]    Unit Number  [id number]    Blood Component Type RED CELLS,LR    Unit division 00    Status of Unit ALLOCATED    Transfusion Status OK TO TRANSFUSE    Crossmatch Result Compatible   Prepare RBC     Status: None   Collection Time: 11/12/17  7:10 AM  Result Value Ref Range   Order Confirmation      ORDER PROCESSED BY BLOOD BANK Performed at [facility], [place] [address] [place], [place] [address]   Glucose, capillary     Status: None   Collection Time: 11/12/17 11:10 AM  Result Value Ref Range   Glucose-Capillary 98 65 - 99 mg/dL   Comment 1 Notify RN   Prepare RBC     Status: None   Collection Time: 11/12/17 11:58 AM  Result Value Ref Range   Order Confirmation      ORDER PROCESSED BY BLOOD BANK BB SAMPLE OR UNITS ALREADY AVAILABLE Performed at [facility], [place] [address] [place], [place] [address]     General: NAD. Vital signs reviewed. HEENT: Normocephalic, atraumatic.  Cardio: RRRR and no JVD. Resp: CTA B/L and Unlabored GI: BS positive and ND Musc/Skel:  No edmea or tenderness. Neuro: Lethargic and oriented x1. Motor:  LUE: 5/5 proximal to distal RUE: 4+/5 proximal to distal (appears to be stable) RLE: HF: 2/5, ADF/PF 2/5 (unchanged) LLE: HF 2/5, ADF/PF 2+/5 (unchanged) Skin: Intact. Warm and dry.    Assessment/Plan: 1. Functional deficits secondary to debility and encephalopathy which require 3+ hours per day of interdisciplinary therapy in a comprehensive inpatient rehab setting. Physiatrist is providing close team supervision and 24 hour management of active medical problems listed below. Physiatrist and rehab team continue to assess barriers to discharge/monitor patient progress toward functional and medical goals. FIM: Function - Bathing Position: Bed Body parts bathed by patient: Right arm, Left arm, Chest, Abdomen, Front perineal area, Right upper leg, Left upper leg Body parts bathed by helper: Buttocks, Right lower leg, Left lower leg Assist Level:  Touching or steadying assistance(Pt > 75%)  Function- Upper Body Dressing/Undressing What is the patient wearing?: Pull over shirt/dress, Bra Bra - Perfomed by patient: Thread/unthread left bra strap Bra - Perfomed by helper: Thread/unthread right bra strap, Hook/unhook bra (pull down sports bra), Thread/unthread left bra strap Pull over shirt/dress - Perfomed by patient: Thread/unthread right sleeve, Thread/unthread left sleeve, Put head through opening, Pull shirt over trunk Pull over shirt/dress - Perfomed by helper: Thread/unthread right sleeve, Thread/unthread left sleeve, Put head through opening, Pull shirt over trunk Assist Level: More than reasonable time, Set up Function - Lower Body Dressing/Undressing What is the patient wearing?: Pants Position: Bed Underwear - Performed by helper: Thread/unthread right underwear leg, Thread/unthread left underwear leg Pants- Performed by helper: Thread/unthread right pants leg, Thread/unthread left pants leg, Pull pants up/down Assist for footwear: Maximal assist Assist for lower body dressing: Touching or steadying assistance (Pt > 75%) Set up : To obtain clothing/put away  Function - Toileting Toileting activity did not occur: No continent bowel/bladder event Toileting steps completed by helper: Adjust clothing prior to toileting, Performs perineal hygiene, Adjust clothing after toileting(per [place], NT report) Assist level: Touching or steadying assistance (Pt.75%)(per [place],  NT report)  Function - Air cabin crew transfer activity did not occur: Safety/medical concerns Assist level to toilet: 2 helpers(per [place], NT report)  Function - Chair/bed transfer Chair/bed transfer method: Lateral scoot Chair/bed transfer assist level: 2 helpers Chair/bed transfer assistive device: Sliding board(beasy board) Chair/bed transfer details: Verbal cues for safe use of DME/AE, Verbal cues for  precautions/safety, Verbal cues for technique, Manual facilitation for placement, Manual facilitation for weight shifting  Function - Locomotion: Wheelchair Will patient use wheelchair at discharge?: Yes Type: Manual Max wheelchair distance: 100' Assist Level: Touching or steadying assistance (Pt > 75%) Assist Level: Touching or steadying assistance (Pt > 75%) Wheel 150 feet activity did not occur: Safety/medical concerns Turns around,maneuvers to table,bed, and toilet,negotiates 3% grade,maneuvers on rugs and over doorsills: No Function - Locomotion: Ambulation Ambulation activity did not occur: Safety/medical concerns Walk 10 feet activity did not occur: Safety/medical concerns Walk 50 feet with 2 turns activity did not occur: Safety/medical concerns Walk 150 feet activity did not occur: Safety/medical concerns Walk 10 feet on uneven surfaces activity did not occur: Safety/medical concerns  Function - Comprehension Comprehension: Auditory Comprehension assist level: Understands basic 90% of the time/cues < 10% of the time  Function - Expression Expression: Verbal Expression assist level: Expresses basic 75 - 89% of the time/requires cueing 10 - 24% of the time. Needs helper to occlude trach/needs to repeat words.  Function - Social Interaction Social Interaction assist level: Interacts appropriately 75 - 89% of the time - Needs redirection for appropriate language or to initiate interaction.  Function - Problem Solving Problem solving assist level: Solves basic 50 - 74% of the time/requires cueing 25 - 49% of the time  Function - Memory Memory assist level: Recognizes or recalls 50 - 74% of the time/requires cueing 25 - 49% of the time Patient normally able to recall (first 3 days only): That he or she is in a hospital, Current season  Medical Problem List and Plan: 1.Functional and mobility deficitssecondary to debility and encephalopathy from multiple medical issues   Cont  CIR   Appreciated neurology recs, repeat MRI brain and T-spine ordered 2. DVT Prophylaxis/Anticoagulation: Pharmaceutical:Lovenox 3. Pain Management:tylenol prn 4. Mood:LCSW to follow for evaluation and support. 5. Neuropsych: This patientis not fullycapable of making decisions onherown behalf. 6. Skin/Wound Care:Air mattress for pressure relief measures. Local measures to help managesacral /labialMASD PRAFO's for bilateral LE's 7. Fluids/Electrolytes/Nutrition:Monitor I/O.    Continue to offer supplements to help with protein calorie malnutrition.   Cont Remeron 15 8. C diff colitis:    Vancomycincompleted 2/6 Continue enteric precautions Stools are improving 9. HTN: Monitor BP bid.    Nifedipine bid increased to 60 on 2/6   Imdur increased to 90 on 2/4   Metoprolol increased to 100 BID on 2/3   Blood pressures controlled on 2/8 Vitals:   11/12/17 0943 11/12/17 0946  BP: 133/67   Pulse: 77   Resp:    Temp:    SpO2: 100% 98%  10 T2DM with retinopathy, neuropathy, andnephropathy:Monitor BS ac/hs.    SSI for elevated BS CBG (last 3)  Recent Labs    11/12/17 0021 11/12/17 0701 11/12/17 1110  GLUCAP 85 85 98    Relatively controlled on 2/8 11. H/o depression: Continue Effexor and Remeron.  12. Neurogenic bladder:Has chronic indwelling catheter for 2+ months.   D/ced foley and attempt to manage with I/O caths 13.AKI onCKD with history of overload:    Cr 2.58 on 2/8  Encourage fluids   IVF qhs x2 completed   Will speak with nephrology   Cont to monitor 14. Diffuse CAD: treated medically with Metoprolol, Zetia and Plavix. 15.  ABLA on anemia of chronic disease   Symptomatic anemia, poor PT tolerance dizziness, fatigue   Stool OB positive   Hb 6.9 on 2/8, will transfuse 2 units on 2/8   Appreciate GI recs, plan for colonoscopy, but will need slow bowel prep. Also awaiting husbands call back and authorization 97.  Leukocytosis: Resolved 17. Hypoalbuminemia   Cont supplement 18. Hyperkalemia   K+ 5.0 on 2/8   Cont to monitor  LOS (Days) 7 A FACE TO FACE EVALUATION WAS PERFORMED  Ankit Lorie Phenix 11/12/2017, 12:24 PM

## 2017-11-12 NOTE — Progress Notes (Signed)
Physical Therapy Session Note  Patient Details  Name: Laura [name] MRN: [id number] Date of Birth: 05-26-1961  Today's Date: 11/12/2017 PT Individual Time: 1000-1055 PT Individual Time Calculation (min): 55 min   Short Term Goals: Week 1:  PT Short Term Goal 1 (Week 1): Pt will perform sit<>stand transfer using LRAD, Max assist x1 PT Short Term Goal 2 (Week 1): Pt will tolerate sitting up in w/c in between therapies for 1 hour w/o increase in fatigue PT Short Term Goal 3 (Week 1): Pt will maintain dynamic sitting balance w/ min assist  Skilled Therapeutic Interventions/Progress Updates:    Pt supine in bed upon PT arrival, agreeable to therapy tx but reports feeling dizzy and fatigued this morning. Pt denies pain at rest. Pt's BP 132/70, SpO2 96% on 1L of O2/min and hemoglobin 6.9. Pt agreeable to bed level exercises this session secondary to fatigue and low hemoglobin level. Pt performed supine therex for strengthening, 2 x 10 of each: heel slides, SAQ, hip abd/add, SLR, bridging, ankle pumps, quad sets, adductor squeeze, hip extension against manual resistance, PNF D2 LE extension shoulder flex, and shoulder diagonals. Therapist performed heel cord stretch and hamstring stretch, 2 x 30 sec each. Pt left supine in bed at end of session with needs in reach, bed alarm set.     Therapy Documentation Precautions:  Precautions Precautions: Fall Precaution Comments: monitor blood pressure Restrictions Weight Bearing Restrictions: No   See Function Navigator for Current Functional Status.   Therapy/Group: Individual Therapy  Netta Corrigan, PT, DPT 11/12/2017, 7:58 AM

## 2017-11-13 ENCOUNTER — Other Ambulatory Visit: Payer: Self-pay

## 2017-11-13 ENCOUNTER — Inpatient Hospital Stay (HOSPITAL_COMMUNITY): Payer: No Typology Code available for payment source | Admitting: Speech Pathology

## 2017-11-13 ENCOUNTER — Inpatient Hospital Stay (HOSPITAL_COMMUNITY): Payer: No Typology Code available for payment source | Admitting: Physical Therapy

## 2017-11-13 DIAGNOSIS — K625 Hemorrhage of anus and rectum: Secondary | ICD-10-CM

## 2017-11-13 DIAGNOSIS — R8290 Unspecified abnormal findings in urine: Secondary | ICD-10-CM

## 2017-11-13 DIAGNOSIS — E1149 Type 2 diabetes mellitus with other diabetic neurological complication: Secondary | ICD-10-CM

## 2017-11-13 LAB — CBC
HCT: 35.9 % — ABNORMAL LOW (ref 36.0–46.0)
HEMOGLOBIN: 11.7 g/dL — AB (ref 12.0–15.0)
MCH: 28.5 pg (ref 26.0–34.0)
MCHC: 32.6 g/dL (ref 30.0–36.0)
MCV: 87.6 fL (ref 78.0–100.0)
Platelets: 488 10*3/uL — ABNORMAL HIGH (ref 150–400)
RBC: 4.1 MIL/uL (ref 3.87–5.11)
RDW: 15.3 % (ref 11.5–15.5)
WBC: 8.6 10*3/uL (ref 4.0–10.5)

## 2017-11-13 LAB — BPAM RBC
Blood Product Expiration Date: 201903052359
Blood Product Expiration Date: 201903132359
ISSUE DATE / TIME: 201902081354
ISSUE DATE / TIME: 201902081726
UNIT TYPE AND RH: 8400
Unit Type and Rh: 8400

## 2017-11-13 LAB — TYPE AND SCREEN
ABO/RH(D): AB POS
Antibody Screen: NEGATIVE
Unit division: 0
Unit division: 0

## 2017-11-13 LAB — GLUCOSE, CAPILLARY
GLUCOSE-CAPILLARY: 105 mg/dL — AB (ref 65–99)
GLUCOSE-CAPILLARY: 134 mg/dL — AB (ref 65–99)
Glucose-Capillary: 122 mg/dL — ABNORMAL HIGH (ref 65–99)
Glucose-Capillary: 126 mg/dL — ABNORMAL HIGH (ref 65–99)

## 2017-11-13 LAB — SODIUM, URINE, RANDOM: Sodium, Ur: 77 mmol/L

## 2017-11-13 LAB — CREATININE, URINE, RANDOM: Creatinine, Urine: 34.94 mg/dL

## 2017-11-13 LAB — RENAL FUNCTION PANEL
ALBUMIN: 2.5 g/dL — AB (ref 3.5–5.0)
Anion gap: 15 (ref 5–15)
BUN: 81 mg/dL — ABNORMAL HIGH (ref 6–20)
CO2: 16 mmol/L — ABNORMAL LOW (ref 22–32)
CREATININE: 2.64 mg/dL — AB (ref 0.44–1.00)
Calcium: 8.8 mg/dL — ABNORMAL LOW (ref 8.9–10.3)
Chloride: 110 mmol/L (ref 101–111)
GFR calc Af Amer: 22 mL/min — ABNORMAL LOW (ref >60)
GFR, EST NON AFRICAN AMERICAN: 19 mL/min — AB (ref >60)
GLUCOSE: 115 mg/dL — AB (ref 65–99)
PHOSPHORUS: 7.3 mg/dL — AB (ref 2.5–4.6)
Potassium: 5.1 mmol/L (ref 3.5–5.1)
SODIUM: 141 mmol/L (ref 135–145)

## 2017-11-13 MED ORDER — POLYETHYLENE GLYCOL 3350 17 GM/SCOOP PO POWD
0.5000 | Freq: Once | ORAL | Status: AC
  Administered 2017-11-13: 127.5 g via ORAL
  Filled 2017-11-13: qty 255

## 2017-11-13 MED ORDER — METOCLOPRAMIDE HCL 5 MG/ML IJ SOLN
5.0000 mg | Freq: Once | INTRAMUSCULAR | Status: AC
  Administered 2017-11-13: 5 mg via INTRAVENOUS
  Filled 2017-11-13: qty 2

## 2017-11-13 MED ORDER — STERILE WATER FOR INJECTION IV SOLN
150.0000 meq | INTRAVENOUS | Status: AC
  Administered 2017-11-13: 150 meq via INTRAVENOUS
  Filled 2017-11-13: qty 850

## 2017-11-13 NOTE — Progress Notes (Signed)
Pt's colon and egd postponed til tomorrow AM due to scheduling conflict with anesthesiology today.   Clears today, 32 oz of gatorade and miralax prep tonite.  Case set for 10  AM 2/10

## 2017-11-13 NOTE — Progress Notes (Signed)
Patient ID: Laura [name], female   DOB: 12/14/1960, 57 y.o.   MRN: [id number] [facility] Progress Note   Assessment/ Plan:   1. Chronic kidney disease stage IV with rising BUN: I suspect that this is bifactorial from suboptimal oral intake and possible gastrointestinal bleed/increased protein load to proximal GI tract. Renal function essentially unchanged overnight with some worsening of metabolic acidosis and rising BUN that may be due to her increased stool output/colonoscopy prep. Will order for a Foley catheter to better monitor input and output and give her some isotonic sodium bicarbonate at a gentle rate. 2. Anemia: Status post PRBC transfusion overnight with possibly inaccurate hemoglobin/hematocrit this morning-colonoscopy planned for today. 3. Hyperkalemia: Mild, treated overnight with a single dose of Veltassa.  4. Fever: Status post completion of oral vancomycin for recent Clostridium difficile infection, remains on enteric precautions. Without focal symptoms suggestive of respiratory infection. 5. Hypertension:  blood pressure intermittently elevated-we'll continue to follow to help adjust antihypertensive therapy. 6. 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   Subjective:   With diarrhea overnight due to bowel preparation for colonoscopy later today. She denies any chest pain or shortness of breath.    Objective:   BP (!) 180/82 (BP Location: Right Arm)   Pulse 71   Temp (!) 101.1 F (38.4 C) (Oral)   Resp 20   Ht 4\' 11"  (1.499 m)   Wt 56.9 kg (125 lb 7.1 oz)   SpO2 98%   BMI 25.34 kg/m   Intake/Output Summary (Last 24 hours) at 11/13/2017 1104 Last data filed at 11/13/2017 0930 Gross per 24 hour  Intake 912 ml  Output 950 ml  Net -38 ml   Weight change:   Physical Exam: Gen: Comfortably resting in bed, on oxygen via nasal cannula CVS: Pulse regular rhythm, normal rate, S1  and S2 normal Resp: Anteriorly clear to auscultation-poor inspiratory effort Abd: Soft, flat, nontender Ext: Trace-1+ edema over lower extremities  Imaging: Mr Brain Wo Contrast  Result Date: 11/11/2017 CLINICAL DATA:  Type 2 diabetes.  Encephalopathy.  Weakness. EXAM: MRI HEAD WITHOUT CONTRAST TECHNIQUE: Multiplanar, multiecho pulse sequences of the brain and surrounding structures were obtained without intravenous contrast. COMPARISON:  09/16/2017 MRI.  09/08/2017 CT. FINDINGS: Brain: There is no new or worsening finding. On the previous study, the patient had multiple acute foci of restricted diffusion within the brain in a distribution suggesting acute toxic or metabolic demyelination. This was most prominent within the middle cerebellar peduncles, the corpus callosum and the hemispheric white matter. Today, there is mild lingering T2 shine through signal in those regions. No new foci of restricted diffusion. No cortical or large vessel territory abnormality. No sign of mass lesion, hemorrhage, hydrocephalus or extra-axial collection. Vascular: Major vessels at the base of the brain show flow. Skull and upper cervical spine: Negative Sinuses/Orbits: Inflammatory changes of the paranasal sinuses on the right. Other: None IMPRESSION: No new or progressive findings. Evolutionary changes of the multiple foci of T2 signal and restricted diffusion within the deep brain as outlined above. Foci show abnormal T2 signal presently with T2 shine through but no true restricted diffusion. No new lesions. Presumably, this was toxic or metabolic demyelination which has run its course. Certainly, the patient could have lingering neurologic sequelae due to the underlying damage in the areas of involvement. Electronically Signed   By: Nelson Chimes M.D.   On: 11/11/2017 21:40   Mr Thoracic Spine Wo Contrast  Addendum Date:  11/12/2017   ADDENDUM REPORT: 11/12/2017 14:45 ADDENDUM: Dr. Leonel Ramsay called to discuss this  case. I had not appreciated that there was a prior lumbar MRI which questioned possibility of abnormal signal in the distal cord. That is not visible on today's study, though there is considerable image degradation. My impression is that this is probably a normal area. Electronically Signed   By: Nelson Chimes M.D.   On: 11/12/2017 14:45   Result Date: 11/12/2017 CLINICAL DATA:  Type 2 diabetes. Neuropathy. Chronic kidney disease. Fluid overload and cephalopathy. Weakness. EXAM: MRI THORACIC SPINE WITHOUT CONTRAST TECHNIQUE: Multiplanar, multisequence MR imaging of the thoracic spine was performed. No intravenous contrast was administered. COMPARISON:  Radiography 10/30/2017 FINDINGS: Alignment: No significant malalignment. Mild curvature convex to the right. Vertebrae: No fracture or primary bone lesion. Cord:  No cord compression or primary cord lesion. Paraspinal and other soft tissues: Moderate to large bilateral effusions layering dependently. Disc levels: Non-compressive disc bulges throughout the thoracic region. Small right paracentral disc herniation at T6-7. Small central disc herniation at T8-9. No compressive effect upon the cord. No foraminal extension. Mild lower thoracic facet and ligamentous hypertrophy. IMPRESSION: No likely significant spinal finding. Minor changes of degenerative disc disease and minor thoracic curvature. No compressive stenosis of the canal or neural foramina. Bilateral pleural effusions layering dependently. Electronically Signed: By: Nelson Chimes M.D. On: 11/11/2017 21:33    Labs: BMET Recent Labs  Lab 11/09/17 0423 11/10/17 0647 11/12/17 0515 11/13/17 0501  NA 139 141 140 141  K 5.3* 5.1 5.0 5.1  CL 109 110 110 110  CO2 20* 19* 20* 16*  GLUCOSE 99 122* 96 115*  BUN 78* 80* 82* 81*  CREATININE 2.51* 2.39* 2.58* 2.64*  CALCIUM 8.1* 8.1* 8.2* 8.8*  PHOS  --   --   --  7.3*   CBC Recent Labs  Lab 11/09/17 0423 11/10/17 0647 11/12/17 0515 11/13/17 0501   WBC 10.7* 10.2 7.9 8.6  NEUTROABS 6.3 6.2 4.0  --   HGB 7.4* 7.7* 6.9* 11.7*  HCT 22.8* 23.8* 21.9* 35.9*  MCV 85.1 86.9 87.3 87.6  PLT 482* 472* 475* 488*    Medications:    . bisacodyl  10 mg Rectal Q0600  . clopidogrel  75 mg Oral Daily  . collagenase   Topical Daily  . enoxaparin (LOVENOX) injection  30 mg Subcutaneous Q24H  . ezetimibe  10 mg Oral Daily  . feeding supplement (NEPRO CARB STEADY)  237 mL Oral TID WC  . feeding supplement (PRO-STAT SUGAR FREE 64)  30 mL Oral BID  . Gerhardt's butt cream   Topical QID  . insulin aspart  0-9 Units Subcutaneous TID WC  . isosorbide mononitrate  90 mg Oral Daily  . metoprolol tartrate  100 mg Oral BID  . mirtazapine  15 mg Oral QHS  . multivitamin  1 tablet Oral QHS  . NIFEdipine  60 mg Oral BID  . nystatin cream   Topical BID  . pantoprazole  40 mg Oral Q0600  . patiromer  8.4 g Oral Daily  . venlafaxine XR  150 mg Oral Q breakfast   Elmarie Shiley, MD 11/13/2017, 11:04 AM

## 2017-11-13 NOTE — Progress Notes (Addendum)
Subjective/Complaints: Patient seen lying in bed this morning. She states she slept well overnight. She is much more alert this morning. She asks me to provide for her.  Review of systems: Denies CP, SOB, nausea, vomiting, diarrhea.  Objective: Vital Signs: Blood pressure 125/81, pulse 71, temperature 98.1 F (36.7 C), temperature source Oral, resp. rate 16, height 4' 11"  (1.499 m), weight 56.9 kg (125 lb 7.1 oz), SpO2 99 %. Mr Brain Wo Contrast  Result Date: 11/11/2017 CLINICAL DATA:  Type 2 diabetes.  Encephalopathy.  Weakness. EXAM: MRI HEAD WITHOUT CONTRAST TECHNIQUE: Multiplanar, multiecho pulse sequences of the brain and surrounding structures were obtained without intravenous contrast. COMPARISON:  09/16/2017 MRI.  09/08/2017 CT. FINDINGS: Brain: There is no new or worsening finding. On the previous study, the patient had multiple acute foci of restricted diffusion within the brain in a distribution suggesting acute toxic or metabolic demyelination. This was most prominent within the middle cerebellar peduncles, the corpus callosum and the hemispheric white matter. Today, there is mild lingering T2 shine through signal in those regions. No new foci of restricted diffusion. No cortical or large vessel territory abnormality. No sign of mass lesion, hemorrhage, hydrocephalus or extra-axial collection. Vascular: Major vessels at the base of the brain show flow. Skull and upper cervical spine: Negative Sinuses/Orbits: Inflammatory changes of the paranasal sinuses on the right. Other: None IMPRESSION: No new or progressive findings. Evolutionary changes of the multiple foci of T2 signal and restricted diffusion within the deep brain as outlined above. Foci show abnormal T2 signal presently with T2 shine through but no true restricted diffusion. No new lesions. Presumably, this was toxic or metabolic demyelination which has run its course. Certainly, the patient could have lingering neurologic sequelae  due to the underlying damage in the areas of involvement. Electronically Signed   By: Nelson Chimes M.D.   On: 11/11/2017 21:40   Mr Thoracic Spine Wo Contrast  Addendum Date: 11/12/2017   ADDENDUM REPORT: 11/12/2017 14:45 ADDENDUM: Dr. Leonel Ramsay called to discuss this case. I had not appreciated that there was a prior lumbar MRI which questioned possibility of abnormal signal in the distal cord. That is not visible on today's study, though there is considerable image degradation. My impression is that this is probably a normal area. Electronically Signed   By: Nelson Chimes M.D.   On: 11/12/2017 14:45   Result Date: 11/12/2017 CLINICAL DATA:  Type 2 diabetes. Neuropathy. Chronic kidney disease. Fluid overload and cephalopathy. Weakness. EXAM: MRI THORACIC SPINE WITHOUT CONTRAST TECHNIQUE: Multiplanar, multisequence MR imaging of the thoracic spine was performed. No intravenous contrast was administered. COMPARISON:  Radiography 10/30/2017 FINDINGS: Alignment: No significant malalignment. Mild curvature convex to the right. Vertebrae: No fracture or primary bone lesion. Cord:  No cord compression or primary cord lesion. Paraspinal and other soft tissues: Moderate to large bilateral effusions layering dependently. Disc levels: Non-compressive disc bulges throughout the thoracic region. Small right paracentral disc herniation at T6-7. Small central disc herniation at T8-9. No compressive effect upon the cord. No foraminal extension. Mild lower thoracic facet and ligamentous hypertrophy. IMPRESSION: No likely significant spinal finding. Minor changes of degenerative disc disease and minor thoracic curvature. No compressive stenosis of the canal or neural foramina. Bilateral pleural effusions layering dependently. Electronically Signed: By: Nelson Chimes M.D. On: 11/11/2017 21:33   Results for orders placed or performed during the hospital encounter of 11/05/17 (from the past 72 hour(s))  Glucose, capillary      Status: Abnormal   Collection  Time: 11/10/17 11:46 AM  Result Value Ref Range   Glucose-Capillary 183 (H) 65 - 99 mg/dL  Glucose, capillary     Status: Abnormal   Collection Time: 11/10/17  6:06 PM  Result Value Ref Range   Glucose-Capillary 123 (H) 65 - 99 mg/dL  Glucose, capillary     Status: Abnormal   Collection Time: 11/10/17  8:37 PM  Result Value Ref Range   Glucose-Capillary 126 (H) 65 - 99 mg/dL  Glucose, capillary     Status: Abnormal   Collection Time: 11/11/17  6:38 AM  Result Value Ref Range   Glucose-Capillary 105 (H) 65 - 99 mg/dL  Glucose, capillary     Status: Abnormal   Collection Time: 11/11/17 11:32 AM  Result Value Ref Range   Glucose-Capillary 126 (H) 65 - 99 mg/dL  Glucose, capillary     Status: Abnormal   Collection Time: 11/11/17  4:34 PM  Result Value Ref Range   Glucose-Capillary 112 (H) 65 - 99 mg/dL  Glucose, capillary     Status: None   Collection Time: 11/12/17 12:21 AM  Result Value Ref Range   Glucose-Capillary 85 65 - 99 mg/dL  Basic metabolic panel     Status: Abnormal   Collection Time: 11/12/17  5:15 AM  Result Value Ref Range   Sodium 140 135 - 145 mmol/L   Potassium 5.0 3.5 - 5.1 mmol/L   Chloride 110 101 - 111 mmol/L   CO2 20 (L) 22 - 32 mmol/L   Glucose, Bld 96 65 - 99 mg/dL   BUN 82 (H) 6 - 20 mg/dL   Creatinine, Ser 2.58 (H) 0.44 - 1.00 mg/dL   Calcium 8.2 (L) 8.9 - 10.3 mg/dL   GFR calc non Af Amer 20 (L) >60 mL/min   GFR calc Af Amer 23 (L) >60 mL/min    Comment: (NOTE) The eGFR has been calculated using the CKD EPI equation. This calculation has not been validated in all clinical situations. eGFR's persistently <60 mL/min signify possible Chronic Kidney Disease.    Anion gap 10 5 - 15    Comment: Performed at [facility] [address] [place], [place] [address]  CBC with Differential/Platelet     Status: Abnormal   Collection Time: 11/12/17  5:15 AM  Result Value Ref Range   WBC 7.9 4.0 - 10.5 K/uL   RBC 2.51  (L) 3.87 - 5.11 MIL/uL   Hemoglobin 6.9 (LL) 12.0 - 15.0 g/dL    Comment: REPEATED TO VERIFY CRITICAL RESULT CALLED TO, READ BACK BY AND VERIFIED WITH: F. ODEMELAM,RN [id number] 11/12/2017 T. TYSOR    HCT 21.9 (L) 36.0 - 46.0 %   MCV 87.3 78.0 - 100.0 fL   MCH 27.5 26.0 - 34.0 pg   MCHC 31.5 30.0 - 36.0 g/dL   RDW 17.1 (H) 11.5 - 15.5 %   Platelets 475 (H) 150 - 400 K/uL   Neutrophils Relative % 49 %   Neutro Abs 4.0 1.7 - 7.7 K/uL   Lymphocytes Relative 39 %   Lymphs Abs 3.1 0.7 - 4.0 K/uL   Monocytes Relative 6 %   Monocytes Absolute 0.5 0.1 - 1.0 K/uL   Eosinophils Relative 5 %   Eosinophils Absolute 0.4 0.0 - 0.7 K/uL   Basophils Relative 1 %   Basophils Absolute 0.1 0.0 - 0.1 K/uL    Comment: Performed at [facility] [address] [place], [place] [address]  Iron and TIBC  Status: Abnormal   Collection Time: 11/12/17  5:15 AM  Result Value Ref Range   Iron 25 (L) 28 - 170 ug/dL   TIBC 172 (L) 250 - 450 ug/dL   Saturation Ratios 15 10.4 - 31.8 %   UIBC 147 ug/dL    Comment: Performed at [facility] [address] [place], [place] [address]  Ferritin     Status: None   Collection Time: 11/12/17  5:15 AM  Result Value Ref Range   Ferritin 282 11 - 307 ng/mL    Comment: Performed at [facility], [place] [address], [place], [place] [address]  Vitamin B12     Status: None   Collection Time: 11/12/17  5:15 AM  Result Value Ref Range   Vitamin B-12 870 180 - 914 pg/mL    Comment: (NOTE) This assay is not validated for testing neonatal or myeloproliferative syndrome specimens for Vitamin B12 levels. Performed at [facility], [place] [address], [place], [place] [address]   Glucose, capillary     Status: None   Collection Time: 11/12/17  7:01 AM  Result Value Ref Range   Glucose-Capillary 85 65 - 99 mg/dL  Type and screen Scalp Level     Status: None (Preliminary result)   Collection Time: 11/12/17  7:10 AM  Result Value Ref Range    ABO/RH(D) AB POS    Antibody Screen NEG    Sample Expiration 11/15/2017    Unit Number [id number]    Blood Component Type RED CELLS,LR    Unit division 00    Status of Unit ISSUED    Transfusion Status OK TO TRANSFUSE    Crossmatch Result Compatible    Unit Number [id number]    Blood Component Type RED CELLS,LR    Unit division 00    Status of Unit ISSUED    Transfusion Status OK TO TRANSFUSE    Crossmatch Result      Compatible Performed at [facility], [place] [address] [place], [place] [address]   Prepare RBC     Status: None   Collection Time: 11/12/17  7:10 AM  Result Value Ref Range   Order Confirmation      ORDER PROCESSED BY BLOOD BANK Performed at [facility], [place] [address] [place], [place] [address]   Glucose, capillary     Status: None   Collection Time: 11/12/17 11:10 AM  Result Value Ref Range   Glucose-Capillary 98 65 - 99 mg/dL   Comment 1 Notify RN   Prepare RBC     Status: None   Collection Time: 11/12/17 11:58 AM  Result Value Ref Range   Order Confirmation      ORDER PROCESSED BY BLOOD BANK BB SAMPLE OR UNITS ALREADY AVAILABLE Performed at [facility], [place] [address], [place],  [address]   Glucose, capillary     Status: None   Collection Time: 11/12/17  4:38 PM  Result Value Ref Range   Glucose-Capillary 90 65 - 99 mg/dL   Comment 1 Notify RN   Glucose, capillary     Status: Abnormal   Collection Time: 11/12/17  9:26 PM  Result Value Ref Range   Glucose-Capillary 119 (H) 65 - 99 mg/dL  Sodium, urine, random     Status: None   Collection Time: 11/12/17 11:38 PM  Result Value Ref Range   Sodium, Ur 77 mmol/L    Comment: Performed at [facility], [address] [place], [place] [address]  Creatinine, urine, random     Status: None   Collection Time: 11/12/17 11:38 PM  Result Value Ref Range   Creatinine, Urine 34.94 mg/dL    Comment: Performed at [facility] [address], [place], [place]  [address]  Urinalysis, Routine w reflex microscopic     Status: Abnormal   Collection Time: 11/12/17 11:39 PM  Result Value Ref Range   Color, Urine YELLOW YELLOW   APPearance CLEAR CLEAR   Specific Gravity, Urine 1.012 1.005 - 1.030   pH 6.0 5.0 - 8.0   Glucose, UA NEGATIVE NEGATIVE mg/dL   Hgb urine dipstick NEGATIVE NEGATIVE   Bilirubin Urine NEGATIVE NEGATIVE   Ketones, ur NEGATIVE NEGATIVE mg/dL   Protein, ur 100 (A) NEGATIVE mg/dL   Nitrite NEGATIVE NEGATIVE   Leukocytes, UA SMALL (A) NEGATIVE   RBC / HPF 0-5 0 - 5 RBC/hpf   WBC, UA 6-30 0 - 5 WBC/hpf   Bacteria, UA MANY (A) NONE SEEN   Squamous Epithelial / LPF NONE SEEN NONE SEEN    Comment: Performed at [facility] [address] [place], [place] [address]  CBC     Status: Abnormal   Collection Time: 11/13/17  5:01 AM  Result Value Ref Range   WBC 8.6 4.0 - 10.5 K/uL   RBC 4.10 3.87 - 5.11 MIL/uL   Hemoglobin 11.7 (L) 12.0 - 15.0 g/dL    Comment: DELTA CHECK NOTED POST TRANSFUSION SPECIMEN    HCT 35.9 (L) 36.0 - 46.0 %   MCV 87.6 78.0 - 100.0 fL   MCH 28.5 26.0 - 34.0 pg   MCHC 32.6 30.0 - 36.0 g/dL   RDW 15.3 11.5 - 15.5 %   Platelets 488 (H) 150 - 400 K/uL    Comment: Performed at [facility], [place] [address], [place], [place] [address]  Renal function panel     Status: Abnormal   Collection Time: 11/13/17  5:01 AM  Result Value Ref Range   Sodium 141 135 - 145 mmol/L   Potassium 5.1 3.5 - 5.1 mmol/L   Chloride 110 101 - 111 mmol/L   CO2 16 (L) 22 - 32 mmol/L   Glucose, Bld 115 (H) 65 - 99 mg/dL   BUN 81 (H) 6 - 20 mg/dL   Creatinine, Ser 2.64 (H) 0.44 - 1.00 mg/dL   Calcium 8.8 (L) 8.9 - 10.3 mg/dL   Phosphorus 7.3 (H) 2.5 - 4.6 mg/dL   Albumin 2.5 (L) 3.5 - 5.0 g/dL   GFR calc non Af Amer 19 (L) >60 mL/min   GFR calc Af Amer 22 (L) >60 mL/min    Comment: (NOTE) The eGFR has been calculated using the CKD EPI equation. This calculation has not been validated in all clinical  situations. eGFR's persistently <60 mL/min signify possible Chronic Kidney Disease.    Anion gap 15 5 - 15    Comment: Performed at [facility] [address] [place], [place] [address]  Glucose, capillary     Status: Abnormal   Collection Time: 11/13/17  6:19 AM  Result Value Ref Range   Glucose-Capillary 105 (H) 65 - 99 mg/dL    General: NAD. Vital signs reviewed. HEENT: Normocephalic, atraumatic.  Cardio: RRR and no JVD. Resp: CTA B/L and Unlabored GI: BS positive and ND Musc/Skel:  No edmea or tenderness. Neuro: Alert Motor:  LUE: 5/5 proximal to distal RUE: 4+/5 proximal  to distal (appears to be stable) RLE: HF: 2/5, ADF/PF 2/5 (unchanged) LLE: HF 2+/5, ADF/PF 2+/5  Skin: Intact. Warm and dry.    Assessment/Plan: 1. Functional deficits secondary to debility and encephalopathy which require 3+ hours per day of interdisciplinary therapy in a comprehensive inpatient rehab setting. Physiatrist is providing close team supervision and 24 hour management of active medical problems listed below. Physiatrist and rehab team continue to assess barriers to discharge/monitor patient progress toward functional and medical goals. FIM: Function - Bathing Position: Bed Body parts bathed by patient: Right arm, Left arm, Chest, Abdomen, Front perineal area, Right upper leg, Left upper leg Body parts bathed by helper: Buttocks, Right lower leg, Left lower leg Assist Level: Touching or steadying assistance(Pt > 75%)  Function- Upper Body Dressing/Undressing What is the patient wearing?: Pull over shirt/dress, Bra Bra - Perfomed by patient: Thread/unthread left bra strap Bra - Perfomed by helper: Thread/unthread right bra strap, Hook/unhook bra (pull down sports bra), Thread/unthread left bra strap Pull over shirt/dress - Perfomed by patient: Thread/unthread right sleeve, Thread/unthread left sleeve, Put head through opening, Pull shirt over trunk Pull over shirt/dress - Perfomed by  helper: Thread/unthread right sleeve, Thread/unthread left sleeve, Put head through opening, Pull shirt over trunk Assist Level: More than reasonable time, Set up Function - Lower Body Dressing/Undressing What is the patient wearing?: Pants Position: Bed Underwear - Performed by helper: Thread/unthread right underwear leg, Thread/unthread left underwear leg Pants- Performed by helper: Thread/unthread right pants leg, Thread/unthread left pants leg, Pull pants up/down Assist for footwear: Maximal assist Assist for lower body dressing: Touching or steadying assistance (Pt > 75%) Set up : To obtain clothing/put away  Function - Toileting Toileting activity did not occur: No continent bowel/bladder event Toileting steps completed by helper: Adjust clothing prior to toileting, Performs perineal hygiene, Adjust clothing after toileting(per [place], NT report) Assist level: Touching or steadying assistance (Pt.75%)(per [place] Flirt [place], NT report)  Function - Air cabin crew transfer activity did not occur: Safety/medical concerns Assist level to toilet: 2 helpers(per [place], NT report)  Function - Chair/bed transfer Chair/bed transfer method: Lateral scoot Chair/bed transfer assist level: 2 helpers Chair/bed transfer assistive device: Sliding board(beasy board) Chair/bed transfer details: Verbal cues for safe use of DME/AE, Verbal cues for precautions/safety, Verbal cues for technique, Manual facilitation for placement, Manual facilitation for weight shifting  Function - Locomotion: Wheelchair Will patient use wheelchair at discharge?: Yes Type: Manual Max wheelchair distance: 100' Assist Level: Touching or steadying assistance (Pt > 75%) Assist Level: Touching or steadying assistance (Pt > 75%) Wheel 150 feet activity did not occur: Safety/medical concerns Turns around,maneuvers to table,bed, and toilet,negotiates 3% grade,maneuvers on rugs and over doorsills:  No Function - Locomotion: Ambulation Ambulation activity did not occur: Safety/medical concerns Walk 10 feet activity did not occur: Safety/medical concerns Walk 50 feet with 2 turns activity did not occur: Safety/medical concerns Walk 150 feet activity did not occur: Safety/medical concerns Walk 10 feet on uneven surfaces activity did not occur: Safety/medical concerns  Function - Comprehension Comprehension: Auditory Comprehension assist level: Understands basic 90% of the time/cues < 10% of the time  Function - Expression Expression: Verbal Expression assist level: Expresses basic 90% of the time/requires cueing < 10% of the time.  Function - Social Interaction Social Interaction assist level: Interacts appropriately 75 - 89% of the time - Needs redirection for appropriate language or to initiate interaction.  Function - Problem Solving Problem solving assist level: Solves basic  50 - 74% of the time/requires cueing 25 - 49% of the time  Function - Memory Memory assist level: Recognizes or recalls 50 - 74% of the time/requires cueing 25 - 49% of the time Patient normally able to recall (first 3 days only): That he or she is in a hospital, Current season  Medical Problem List and Plan: 1.Functional and mobility deficitssecondary to debility and encephalopathy from multiple medical issues   Cont CIR   Appreciated neurology recs, repeat MRI brain and T-spine suggesting stable/improvement. Neurology signed off. 2. DVT Prophylaxis/Anticoagulation: Pharmaceutical:Lovenox 3. Pain Management:tylenol prn 4. Mood:LCSW to follow for evaluation and support. 5. Neuropsych: This patientis not fullycapable of making decisions onherown behalf. 6. Skin/Wound Care:Air mattress for pressure relief measures. Local measures to help managesacral /labialMASD PRAFO's for bilateral LE's 7. Fluids/Electrolytes/Nutrition:Monitor I/O.    Continue to offer supplements to help  with protein calorie malnutrition.   Cont Remeron 15 8. C diff colitis:    Vancomycincompleted 2/6 Continue enteric precautions Stools are improving 9. HTN: Monitor BP bid.    Nifedipine bid increased to 60 on 2/6   Imdur increased to 90 on 2/4   Metoprolol increased to 100 BID on 2/3   Blood pressures controlled on 2/9 Vitals:   11/12/17 1950 11/12/17 2115  BP: 132/72 125/81  Pulse: 67 71  Resp:  16  Temp:  98.1 F (36.7 C)  SpO2:  99%  10 T2DM with retinopathy, neuropathy, andnephropathy:Monitor BS ac/hs.    SSI for elevated BS CBG (last 3)  Recent Labs    11/12/17 1638 11/12/17 2126 11/13/17 0619  GLUCAP 90 119* 105*    Relatively controlled on 2/9 11. H/o depression: Continue Effexor and Remeron.  12. Neurogenic bladder:Has chronic indwelling catheter for 2+ months.   D/ced foley and attempt to manage with I/O caths 13.AKI onCKD with history of overload:    Cr 2.64 on 2/9   Encourage fluids   IVF qhs x2 completed   Appreciate nephrology recs, following along   Cont to monitor 14. Diffuse CAD: treated medically with Metoprolol, Zetia and Plavix. 15.  ABLA on anemia of chronic disease   Stool OB positive   Hb 11.7 after transfusion of 2 units on 2/8   Appreciate GI recs, plan for EGD/colonoscopy ?today 16. Leukocytosis: Resolved 17. Hypoalbuminemia   Cont supplement 18. Hyperkalemia   K+ 5.1 on 2/9 after dose of Veltassa Per Nephrology   Cont to monitor 19. Abnormal urine analysis   UA is equivocal, will order urine culture  LOS (Days) 8 A FACE TO FACE EVALUATION WAS PERFORMED  Bonita Brindisi Lorie Phenix 11/13/2017, 7:33 AM

## 2017-11-13 NOTE — Progress Notes (Signed)
Physical Therapy Weekly Progress Note  Patient Details  Name: Laura [name] MRN: [id number] Date of Birth: 11-22-1960  Beginning of progress report period: November 06, 2017 End of progress report period: November 13, 2017  Today's Date: 11/13/2017 PT Individual Time: 1100-1200 PT Individual Time Calculation (min): 60 min   Patient has met 1 of 3 short term goals.  Pt exhibits improved tolerance for sitting up in wheelchair between therapy sessions. Pt is still currently max to total assist for transfers and max assist for dynamic sitting balance.  Patient continues to demonstrate the following deficits muscle weakness, decreased coordination, decreased problem solving, decreased safety awareness and decreased memory and decreased sitting balance, decreased standing balance and decreased postural control and therefore will continue to benefit from skilled PT intervention to increase functional independence with mobility.  Patient progressing toward long term goals..  Continue plan of care.  PT Short Term Goals Week 1:  PT Short Term Goal 1 (Week 1): Pt will perform sit<>stand transfer using LRAD, Max assist x1 PT Short Term Goal 1 - Progress (Week 1): Progressing toward goal PT Short Term Goal 2 (Week 1): Pt will tolerate sitting up in w/c in between therapies for 1 hour w/o increase in fatigue PT Short Term Goal 2 - Progress (Week 1): Met PT Short Term Goal 3 (Week 1): Pt will maintain dynamic sitting balance w/ min assist PT Short Term Goal 3 - Progress (Week 1): Progressing toward goal Week 2:  PT Short Term Goal 1 (Week 2): Pt will perform bed to/from wheelchair transfer with mod assist PT Short Term Goal 2 (Week 2): Pt will maintain dynamic sitting balance with min assist PT Short Term Goal 3 (Week 2): Pt will perform sit to/from stand transfer with LRAD with max assist consistently  Skilled Therapeutic Interventions/Progress Updates:  Ambulation/gait training;Cognitive  remediation/compensation;Discharge planning;DME/adaptive equipment instruction;Functional mobility training;Pain management;Psychosocial support;Splinting/orthotics;Therapeutic Activities;UE/LE Strength taining/ROM;Visual/perceptual remediation/compensation;Wheelchair propulsion/positioning;UE/LE Coordination activities;Therapeutic Exercise;Stair training;Skin care/wound management;Patient/family education;Neuromuscular re-education;Functional electrical stimulation;Disease management/prevention;Community reintegration;Balance/vestibular training   Pt supine in bed upon therapist arrival. Pt reports no pain and is agreeable to participate in therapy session. BP 175/92 in supine, HR 88. SpO2 96% on 1.5L O2. Supine BLE strengthening therex x 10-15 reps. Pt has incontinence of bowel. Rolling L/R with min assist and use of bedrails, dependent for pericare. Supine to sit max assist with use of bedrails and HOB elevated. Pt is unable to maintain sitting balance EOB without max assist. Lateral scoot transfer bed to wheelchair total assist due to pt sliding off edge of air mattress and unable to assist in transfer. Max assist for scooting back in wheelchair/repositioning. Pt left seated in TIS w/c with needs in reach, quick release belt in place. BP 166/88 while seated in w/c at end of session.  Therapy Documentation Precautions:  Precautions Precautions: Fall Precaution Comments: monitor blood pressure Restrictions Weight Bearing Restrictions: No  See Function Navigator for Current Functional Status.  Therapy/Group: Individual Therapy  Excell Seltzer, PT, DPT  11/13/2017, 3:36 PM

## 2017-11-13 NOTE — Progress Notes (Signed)
MD Schertz called regarding order for foley and concerns with CAUTI risk with patient having bowel prep/multi loose bowel movements. Order to d/c foley and continue with in and out cath.

## 2017-11-13 NOTE — Progress Notes (Signed)
Speech Language Pathology Daily Session Note  Patient Details  Name: Laura [name] MRN: [id number] Date of Birth: 1960/10/16  Today's Date: 11/13/2017 SLP Individual Time: 1450-1530 SLP Individual Time Calculation (min): 40 min  Short Term Goals: Week 2: SLP Short Term Goal 1 (Week 2): Pt will utilize external memory aides to recall new dialy information with Mod A cues.  SLP Short Term Goal 2 (Week 2): Pt will complete basic familiar problem solving task related to ADL with Min A cues.  SLP Short Term Goal 3 (Week 2): Pt will sustain attention to basic familiar task for ~ 10 minutes with Min A cues.  SLP Short Term Goal 4 (Week 2): Pt will increase vocal intensity to achieve intelligibility in mildly noisy environments with mod verbal cues.    Skilled Therapeutic Interventions:  Pt was seen for skilled ST targeting cognitive goals.  Pt was upright in wheelchair upon arrival with supplemental O2 via 1L nasal cannula.  Pt was able to maintain O2 sats >95 on room air for the duration of today's therapy session as verified by periodic checks.  SLP facilitated the session with money management tasks to address goals for sustained attention and problem solving.  Pt needed mod-max assist multimodal cues to count money due to decreased task organization and decreased working memory for completing basic mental math calculations.  Pt did demonstrate basic recall of her current medical issues, stating "my hemoglobin keeps dropping," with min assist question cues.  Pt was returned to room and left in bed with bed alarm set and call bell within reach.  Continue per current plan of care.      Function:  Eating Eating                 Cognition Comprehension Comprehension assist level: Understands basic 90% of the time/cues < 10% of the time  Expression   Expression assist level: Expresses basic 75 - 89% of the time/requires cueing 10 - 24% of the time. Needs helper to occlude trach/needs to repeat  words.  Social Interaction Social Interaction assist level: Interacts appropriately 75 - 89% of the time - Needs redirection for appropriate language or to initiate interaction.  Problem Solving Problem solving assist level: Solves basic 50 - 74% of the time/requires cueing 25 - 49% of the time  Memory Memory assist level: Recognizes or recalls 50 - 74% of the time/requires cueing 25 - 49% of the time    Pain Pain Assessment Pain Assessment: No/denies pain  Therapy/Group: Individual Therapy  Laura [name], Selinda Orion 11/13/2017, 3:31 PM

## 2017-11-13 NOTE — Plan of Care (Signed)
  Not Progressing RH BLADDER ELIMINATION RH STG MANAGE BLADDER WITH ASSISTANCE Description STG Manage Bladder With Assistance mod  11/13/2017 1553 - Not Progressing by Joanne Salah, Renato Gails, RN RH STG MANAGE BLADDER WITH MEDICATION WITH ASSISTANCE Description STG Manage Bladder With Medication With Assistance. mod  11/13/2017 1553 - Not Progressing by Demira Gwynne, Renato Gails, RN RH STG MANAGE BLADDER WITH EQUIPMENT WITH ASSISTANCE Description STG Manage Bladder With Equipment With mod Assistance  11/13/2017 1553 - Not Progressing by Rutha Melgoza, Renato Gails, RN RH SKIN INTEGRITY RH STG SKIN FREE OF INFECTION/BREAKDOWN Description No new skin breakdown/infection with mod assistance  11/13/2017 1553 - Not Progressing by Jakaila Norment, Renato Gails, RN RH STG MAINTAIN SKIN INTEGRITY WITH ASSISTANCE Description STG Maintain Skin Integrity With mod Assistance.  11/13/2017 1553 - Not Progressing by Afton Lavalle, Renato Gails, RN RH STG ABLE TO PERFORM INCISION/WOUND CARE W/ASSISTANCE Description STG Able To Perform Incision/Wound Care With mod Assistance.  11/13/2017 1553 - Not Progressing by Hillery Jacks, RN  Total assist with all

## 2017-11-14 ENCOUNTER — Inpatient Hospital Stay (HOSPITAL_COMMUNITY): Payer: No Typology Code available for payment source

## 2017-11-14 ENCOUNTER — Encounter: Payer: Self-pay | Admitting: Gastroenterology

## 2017-11-14 ENCOUNTER — Ambulatory Visit (HOSPITAL_COMMUNITY): Admit: 2017-11-14 | Payer: No Typology Code available for payment source | Admitting: Gastroenterology

## 2017-11-14 ENCOUNTER — Encounter (HOSPITAL_COMMUNITY)
Admission: RE | Disposition: A | Discharge status: Skilled Nursing Facility | Payer: Self-pay | Source: Intra-hospital | Attending: Physical Medicine & Rehabilitation

## 2017-11-14 ENCOUNTER — Ambulatory Visit (HOSPITAL_COMMUNITY)
Admission: RE | Admit: 2017-11-14 | Payer: No Typology Code available for payment source | Source: Ambulatory Visit | Admitting: Gastroenterology

## 2017-11-14 ENCOUNTER — Encounter (HOSPITAL_COMMUNITY): Payer: Self-pay | Admitting: *Deleted

## 2017-11-14 ENCOUNTER — Inpatient Hospital Stay (HOSPITAL_COMMUNITY): Payer: No Typology Code available for payment source | Admitting: Certified Registered Nurse Anesthetist

## 2017-11-14 ENCOUNTER — Encounter (HOSPITAL_COMMUNITY): Payer: Self-pay

## 2017-11-14 ENCOUNTER — Encounter (HOSPITAL_COMMUNITY): Payer: Self-pay | Admitting: Certified Registered Nurse Anesthetist

## 2017-11-14 DIAGNOSIS — D123 Benign neoplasm of transverse colon: Secondary | ICD-10-CM

## 2017-11-14 DIAGNOSIS — Z7901 Long term (current) use of anticoagulants: Secondary | ICD-10-CM | POA: Insufficient documentation

## 2017-11-14 DIAGNOSIS — I10 Essential (primary) hypertension: Secondary | ICD-10-CM | POA: Insufficient documentation

## 2017-11-14 HISTORY — PX: ESOPHAGOGASTRODUODENOSCOPY (EGD) WITH PROPOFOL: SHX5813

## 2017-11-14 HISTORY — PX: COLONOSCOPY WITH PROPOFOL: SHX5780

## 2017-11-14 LAB — RENAL FUNCTION PANEL
Albumin: 2.4 g/dL — ABNORMAL LOW (ref 3.5–5.0)
Anion gap: 16 — ABNORMAL HIGH (ref 5–15)
BUN: 76 mg/dL — AB (ref 6–20)
CALCIUM: 8.8 mg/dL — AB (ref 8.9–10.3)
CO2: 18 mmol/L — AB (ref 22–32)
CREATININE: 2.55 mg/dL — AB (ref 0.44–1.00)
Chloride: 109 mmol/L (ref 101–111)
GFR calc Af Amer: 23 mL/min — ABNORMAL LOW (ref >60)
GFR calc non Af Amer: 20 mL/min — ABNORMAL LOW (ref >60)
Glucose, Bld: 88 mg/dL (ref 65–99)
Phosphorus: 6.3 mg/dL — ABNORMAL HIGH (ref 2.5–4.6)
Potassium: 4.7 mmol/L (ref 3.5–5.1)
SODIUM: 143 mmol/L (ref 135–145)

## 2017-11-14 LAB — GLUCOSE, CAPILLARY
GLUCOSE-CAPILLARY: 82 mg/dL (ref 65–99)
GLUCOSE-CAPILLARY: 89 mg/dL (ref 65–99)
Glucose-Capillary: 86 mg/dL (ref 65–99)
Glucose-Capillary: 85 mg/dL (ref 65–99)
Glucose-Capillary: 113 mg/dL — ABNORMAL HIGH (ref 65–99)

## 2017-11-14 LAB — UREA NITROGEN, URINE: Urea Nitrogen, Ur: 355 mg/dL

## 2017-11-14 SURGERY — ESOPHAGOGASTRODUODENOSCOPY (EGD) WITH PROPOFOL
Anesthesia: Monitor Anesthesia Care

## 2017-11-14 SURGERY — COLONOSCOPY
Anesthesia: Monitor Anesthesia Care

## 2017-11-14 MED ORDER — SODIUM CHLORIDE 0.9 % IV SOLN
INTRAVENOUS | Status: AC
  Administered 2017-11-14: 13:00:00 via INTRAVENOUS

## 2017-11-14 MED ORDER — PROPOFOL 10 MG/ML IV BOLUS
INTRAVENOUS | Status: DC | PRN
  Administered 2017-11-14 (×2): 20 mg via INTRAVENOUS

## 2017-11-14 MED ORDER — PANTOPRAZOLE SODIUM 40 MG IV SOLR
40.0000 mg | Freq: Two times a day (BID) | INTRAVENOUS | Status: DC
  Administered 2017-11-14 – 2017-11-16 (×5): 40 mg via INTRAVENOUS
  Filled 2017-11-14 (×6): qty 40

## 2017-11-14 MED ORDER — LACTATED RINGERS IV SOLN
INTRAVENOUS | Status: DC | PRN
  Administered 2017-11-14: 10:00:00 via INTRAVENOUS

## 2017-11-14 MED ORDER — LIDOCAINE HCL (CARDIAC) 20 MG/ML IV SOLN
INTRAVENOUS | Status: DC | PRN
  Administered 2017-11-14: 50 mg via INTRAVENOUS

## 2017-11-14 MED ORDER — SODIUM BICARBONATE 650 MG PO TABS
650.0000 mg | ORAL_TABLET | Freq: Two times a day (BID) | ORAL | Status: DC
  Administered 2017-11-14 – 2017-11-26 (×25): 650 mg via ORAL
  Filled 2017-11-14 (×26): qty 1

## 2017-11-14 MED ORDER — PROPOFOL 500 MG/50ML IV EMUL
INTRAVENOUS | Status: DC | PRN
  Administered 2017-11-14: 75 ug/kg/min via INTRAVENOUS

## 2017-11-14 SURGICAL SUPPLY — 24 items

## 2017-11-14 NOTE — Interval H&P Note (Signed)
History and Physical Interval Note:  11/14/2017 10:10 AM  [name]  has presented today for surgery, with the diagnosis of Gi bleed  The various methods of treatment have been discussed with the patient and family. After consideration of risks, benefits and other options for treatment, the patient has consented to  Procedure(s): ESOPHAGOGASTRODUODENOSCOPY (EGD) WITH PROPOFOL (N/A) COLONOSCOPY WITH PROPOFOL (N/A) as a surgical intervention .  The patient's history has been reviewed, patient examined, no change in status, stable for surgery.  I have reviewed the patient's chart and labs.  Questions were answered to the patient's satisfaction.     [place]

## 2017-11-14 NOTE — Progress Notes (Signed)
Patient noted to have a little more confusion during conversation this evening. Patient alert and oriented to self,year and place but confused about surroundings and who had visited. Patient was calling out for person that was not there at one point. MD Posey Pronto notified. No new orders received at this time. Report off to oncoming RN to continue to monitor.

## 2017-11-14 NOTE — Anesthesia Preprocedure Evaluation (Signed)
Anesthesia Evaluation  Patient identified by MRN, date of birth, ID band Patient awake    Reviewed: Allergy & Precautions, NPO status , Patient's Chart, lab work & pertinent test results  Airway Mallampati: II       Dental   Pulmonary neg pulmonary ROS,    breath sounds clear to auscultation       Cardiovascular hypertension, +CHF   Rhythm:Regular Rate:Normal     Neuro/Psych  Neuromuscular disease    GI/Hepatic negative GI ROS, Neg liver ROS,   Endo/Other  diabetes  Renal/GU Renal disease     Musculoskeletal   Abdominal   Peds  Hematology  (+) anemia ,   Anesthesia Other Findings   Reproductive/Obstetrics                             Anesthesia Physical Anesthesia Plan  ASA: III  Anesthesia Plan: MAC   Post-op Pain Management:    Induction: Intravenous  PONV Risk Score and Plan: 2 and Treatment may vary due to age or medical condition  Airway Management Planned:   Additional Equipment:   Intra-op Plan:   Post-operative Plan:   Informed Consent: I have reviewed the patients History and Physical, chart, labs and discussed the procedure including the risks, benefits and alternatives for the proposed anesthesia with the patient or authorized representative who has indicated his/her understanding and acceptance.   Dental advisory given  Plan Discussed with: CRNA and Anesthesiologist  Anesthesia Plan Comments:         Anesthesia Quick Evaluation

## 2017-11-14 NOTE — Op Note (Signed)
[facility] Patient Name: Laura [name] Procedure Date : 11/14/2017 MRN: [id number] Attending MD: Carlota Raspberry. Armbruster MD, MD Date of Birth: 06-09-1961 CSN: [id number] Age: 57 Admit Type: Inpatient Procedure:                Colonoscopy Indications:              Gastrointestinal bleeding, history of Plavix use,                            anemia Providers:                Carlota Raspberry. Armbruster MD, MD, Elmer Ramp. Tilden Dome, RN,                            Laurena Spies, Technician Referring MD:              Medicines:                Monitored Anesthesia Care Complications:            No immediate complications. Estimated blood loss:                            Minimal. Estimated Blood Loss:     Estimated blood loss was minimal. Procedure:                Pre-Anesthesia Assessment:                           - Prior to the procedure, a History and Physical                            was performed, and patient medications and                            allergies were reviewed. The patient's tolerance of                            previous anesthesia was also reviewed. The risks                            and benefits of the procedure and the sedation                            options and risks were discussed with the patient.                            All questions were answered, and informed consent                            was obtained. Prior Anticoagulants: The patient has                            taken Plavix (clopidogrel), last dose was 1 day                            prior to  procedure. ASA Grade Assessment: III - A                            patient with severe systemic disease. After                            reviewing the risks and benefits, the patient was                            deemed in satisfactory condition to undergo the                            procedure.                           After obtaining informed consent, the colonoscope         was passed under direct vision. Throughout the                            procedure, the patient's blood pressure, pulse, and                            oxygen saturations were monitored continuously. The                            EC-[id number] ([id number]) scope was introduced through                            the anus and advanced to the the terminal ileum,                            with identification of the appendiceal orifice and                            IC valve. The colonoscopy was performed without                            difficulty. The patient tolerated the procedure                            well. The quality of the bowel preparation was                            fair. The terminal ileum, ileocecal valve,                            appendiceal orifice, and rectum were photographed. Scope In: 10:36:30 AM Scope Out: 10:54:22 AM Scope Withdrawal Time: 0 hours 9 minutes 37 seconds  Total Procedure Duration: 0 hours 17 minutes 52 seconds  Findings:      The digital rectal exam findings include decreased sphincter tone.      The terminal ileum appeared normal.      A 4 mm polyp was found in the proximal transverse colon. The polyp was  sessile. The polyp was removed with a cold biopsy forceps. Resection and       retrieval were complete.      A moderate amount of liquid stool was found in the entire colon, making       visualization difficult. Lavage of the area was performed using copious       amounts of sterile water, resulting in clearance with fair       visualization. Retained pill fragments were noted in the cecum and the       cecum could not be completely cleared.      Internal hemorrhoids were found.      Due to low anal tone, the patient did not retain air very well in the       distal left colon. Retroflexed views were not obtained due to this       issue. The exam was otherwise without abnormality. Impression:               - Preparation of the colon was  fair but good enough                            to rule out any pathology to cause anemia.                           - Decreased sphincter tone found on digital rectal                            exam.                           - The examined portion of the ileum was normal.                           - One 4 mm polyp in the proximal transverse colon,                            removed with a cold biopsy forceps. Resected and                            retrieved.                           - Internal hemorrhoids.                           - The examination was otherwise normal.                           No cause for anemia on colonoscopy. Moderate Sedation:      No moderate sedation, case performed with MAC Recommendation:           - Return patient to hospital ward for ongoing care.                           - Soft diet.                           - Continue present  medications.                           - Await pathology results.                           - Repeat colonoscopy date to be determined after                            pending pathology results are reviewed for                            surveillance.                           - Further recommendations per EGD note Procedure Code(s):        --- Professional ---                           [phone], Colonoscopy, flexible; with biopsy, single                            or multiple Diagnosis Code(s):        --- Professional ---                           K64.8, Other hemorrhoids                           K62.89, Other specified diseases of anus and rectum                           D12.3, Benign neoplasm of transverse colon (hepatic                            flexure or splenic flexure)                           K92.2, Gastrointestinal hemorrhage, unspecified CPT copyright 2016 [employer]. All rights reserved. The codes documented in this report are preliminary and upon coder review may  be revised to meet current  compliance requirements. Remo Lipps P. Armbruster MD, MD 11/14/2017 11:08:48 AM This report has been signed electronically. Number of Addenda: 0

## 2017-11-14 NOTE — Progress Notes (Signed)
Subjective/Complaints: Patient seen lying in bed this morning. She states she slept well overnight. She appears a little concerned about her procedure today, but is aware that she is going to have a procedure. The procedure was postponed yesterday due to conflict with anesthesia scheduling. She was placed on sodium bicarbonate by nephro.  Review of systems: Denies CP, SOB, nausea, vomiting, diarrhea.  Objective: Vital Signs: Blood pressure (!) 167/83, pulse 86, temperature (!) 97.4 F (36.3 C), temperature source Oral, resp. rate 18, height 4' 11" (1.499 m), weight 58.5 kg (129 lb), SpO2 96 %. No results found. Results for orders placed or performed during the hospital encounter of 11/05/17 (from the past 72 hour(s))  Glucose, capillary     Status: Abnormal   Collection Time: 11/11/17 11:32 AM  Result Value Ref Range   Glucose-Capillary 126 (H) 65 - 99 mg/dL  Glucose, capillary     Status: Abnormal   Collection Time: 11/11/17  4:34 PM  Result Value Ref Range   Glucose-Capillary 112 (H) 65 - 99 mg/dL  Glucose, capillary     Status: None   Collection Time: 11/12/17 12:21 AM  Result Value Ref Range   Glucose-Capillary 85 65 - 99 mg/dL  Basic metabolic panel     Status: Abnormal   Collection Time: 11/12/17  5:15 AM  Result Value Ref Range   Sodium 140 135 - 145 mmol/L   Potassium 5.0 3.5 - 5.1 mmol/L   Chloride 110 101 - 111 mmol/L   CO2 20 (L) 22 - 32 mmol/L   Glucose, Bld 96 65 - 99 mg/dL   BUN 82 (H) 6 - 20 mg/dL   Creatinine, Ser 2.58 (H) 0.44 - 1.00 mg/dL   Calcium 8.2 (L) 8.9 - 10.3 mg/dL   GFR calc non Af Amer 20 (L) >60 mL/min   GFR calc Af Amer 23 (L) >60 mL/min    Comment: (NOTE) The eGFR has been calculated using the CKD EPI equation. This calculation has not been validated in all clinical situations. eGFR's persistently <60 mL/min signify possible Chronic Kidney Disease.    Anion gap 10 5 - 15    Comment: Performed at [facility] [address],  [place], [place] [address]  CBC with Differential/Platelet     Status: Abnormal   Collection Time: 11/12/17  5:15 AM  Result Value Ref Range   WBC 7.9 4.0 - 10.5 K/uL   RBC 2.51 (L) 3.87 - 5.11 MIL/uL   Hemoglobin 6.9 (LL) 12.0 - 15.0 g/dL    Comment: REPEATED TO VERIFY CRITICAL RESULT CALLED TO, READ BACK BY AND VERIFIED WITH: F. ODEMELAM,RN [id number] 11/12/2017 T. TYSOR    HCT 21.9 (L) 36.0 - 46.0 %   MCV 87.3 78.0 - 100.0 fL   MCH 27.5 26.0 - 34.0 pg   MCHC 31.5 30.0 - 36.0 g/dL   RDW 17.1 (H) 11.5 - 15.5 %   Platelets 475 (H) 150 - 400 K/uL   Neutrophils Relative % 49 %   Neutro Abs 4.0 1.7 - 7.7 K/uL   Lymphocytes Relative 39 %   Lymphs Abs 3.1 0.7 - 4.0 K/uL   Monocytes Relative 6 %   Monocytes Absolute 0.5 0.1 - 1.0 K/uL   Eosinophils Relative 5 %   Eosinophils Absolute 0.4 0.0 - 0.7 K/uL   Basophils Relative 1 %   Basophils Absolute 0.1 0.0 - 0.1 K/uL    Comment: Performed at [facility] [address], [place], [place] [address]  Iron and TIBC     Status: Abnormal   Collection Time: 11/12/17  5:15 AM  Result Value Ref Range   Iron 25 (L) 28 - 170 ug/dL   TIBC 172 (L) 250 - 450 ug/dL   Saturation Ratios 15 10.4 - 31.8 %   UIBC 147 ug/dL    Comment: Performed at [facility], [place] [address], [place], [place] [address]  Ferritin     Status: None   Collection Time: 11/12/17  5:15 AM  Result Value Ref Range   Ferritin 282 11 - 307 ng/mL    Comment: Performed at [facility], [place] [address] [place], [place] [address]  Vitamin B12     Status: None   Collection Time: 11/12/17  5:15 AM  Result Value Ref Range   Vitamin B-12 870 180 - 914 pg/mL    Comment: (NOTE) This assay is not validated for testing neonatal or myeloproliferative syndrome specimens for Vitamin B12 levels. Performed at [facility], [place] [address] [place], [place] [address]   Glucose, capillary     Status: None   Collection Time: 11/12/17  7:01 AM  Result Value Ref Range    Glucose-Capillary 85 65 - 99 mg/dL  Type and screen [place]     Status: None   Collection Time: 11/12/17  7:10 AM  Result Value Ref Range   ABO/RH(D) AB POS    Antibody Screen NEG    Sample Expiration 11/15/2017    Unit Number [id number]    Blood Component Type RED CELLS,LR    Unit division 00    Status of Unit ISSUED,FINAL    Transfusion Status OK TO TRANSFUSE    Crossmatch Result      Compatible Performed at [facility], [place] [address] [place], [place] [address]    Unit Number [id number]    Blood Component Type RED CELLS,LR    Unit division 00    Status of Unit ISSUED,FINAL    Transfusion Status OK TO TRANSFUSE    Crossmatch Result Compatible   Prepare RBC     Status: None   Collection Time: 11/12/17  7:10 AM  Result Value Ref Range   Order Confirmation      ORDER PROCESSED BY BLOOD BANK Performed at [facility], [place] [address] [place], [place] [address]   Glucose, capillary     Status: None   Collection Time: 11/12/17 11:10 AM  Result Value Ref Range   Glucose-Capillary 98 65 - 99 mg/dL   Comment 1 Notify RN   Prepare RBC     Status: None   Collection Time: 11/12/17 11:58 AM  Result Value Ref Range   Order Confirmation      ORDER PROCESSED BY BLOOD BANK BB SAMPLE OR UNITS ALREADY AVAILABLE Performed at [facility], [place] [address], [place], [place] [address]   Glucose, capillary     Status: None   Collection Time: 11/12/17  4:38 PM  Result Value Ref Range   Glucose-Capillary 90 65 - 99 mg/dL   Comment 1 Notify RN   Glucose, capillary     Status: Abnormal   Collection Time: 11/12/17  9:26 PM  Result Value Ref Range   Glucose-Capillary 119 (H) 65 - 99 mg/dL  Sodium, urine, random     Status: None   Collection Time: 11/12/17 11:38 PM  Result Value Ref Range   Sodium, Ur 77 mmol/L    Comment:  Performed at [facility], [place] [address] [place], [place] [address]  Urea nitrogen, urine     Status: None    Collection Time: 11/12/17 11:38 PM  Result Value Ref Range   Urea Nitrogen, Ur 355 Not Estab. mg/dL    Comment: (NOTE) Performed At: [facility] [place], [place] [id number] Rush Farmer MD [id number] Performed at [facility], [place] [address] [place], [place] [address]   Creatinine, urine, random     Status: None   Collection Time: 11/12/17 11:38 PM  Result Value Ref Range   Creatinine, Urine 34.94 mg/dL    Comment: Performed at [facility] [address], [place], [place] [address]  Urinalysis, Routine w reflex microscopic     Status: Abnormal   Collection Time: 11/12/17 11:39 PM  Result Value Ref Range   Color, Urine YELLOW YELLOW   APPearance CLEAR CLEAR   Specific Gravity, Urine 1.012 1.005 - 1.030   pH 6.0 5.0 - 8.0   Glucose, UA NEGATIVE NEGATIVE mg/dL   Hgb urine dipstick NEGATIVE NEGATIVE   Bilirubin Urine NEGATIVE NEGATIVE   Ketones, ur NEGATIVE NEGATIVE mg/dL   Protein, ur 100 (A) NEGATIVE mg/dL   Nitrite NEGATIVE NEGATIVE   Leukocytes, UA SMALL (A) NEGATIVE   RBC / HPF 0-5 0 - 5 RBC/hpf   WBC, UA 6-30 0 - 5 WBC/hpf   Bacteria, UA MANY (A) NONE SEEN   Squamous Epithelial / LPF NONE SEEN NONE SEEN    Comment: Performed at [facility] [address], [place], [place] [address]  CBC     Status: Abnormal   Collection Time: 11/13/17  5:01 AM  Result Value Ref Range   WBC 8.6 4.0 - 10.5 K/uL   RBC 4.10 3.87 - 5.11 MIL/uL   Hemoglobin 11.7 (L) 12.0 - 15.0 g/dL    Comment: DELTA CHECK NOTED POST TRANSFUSION SPECIMEN    HCT 35.9 (L) 36.0 - 46.0 %   MCV 87.6 78.0 - 100.0 fL   MCH 28.5 26.0 - 34.0 pg   MCHC 32.6 30.0 - 36.0 g/dL   RDW 15.3 11.5 - 15.5 %   Platelets 488 (H) 150 - 400 K/uL    Comment: Performed at [facility], [place] [address] [place], [place] [address]  Renal function panel     Status: Abnormal   Collection Time: 11/13/17  5:01 AM  Result Value Ref Range   Sodium 141 135 - 145 mmol/L    Potassium 5.1 3.5 - 5.1 mmol/L   Chloride 110 101 - 111 mmol/L   CO2 16 (L) 22 - 32 mmol/L   Glucose, Bld 115 (H) 65 - 99 mg/dL   BUN 81 (H) 6 - 20 mg/dL   Creatinine, Ser 2.64 (H) 0.44 - 1.00 mg/dL   Calcium 8.8 (L) 8.9 - 10.3 mg/dL   Phosphorus 7.3 (H) 2.5 - 4.6 mg/dL   Albumin 2.5 (L) 3.5 - 5.0 g/dL   GFR calc non Af Amer 19 (L) >60 mL/min   GFR calc Af Amer 22 (L) >60 mL/min    Comment: (NOTE) The eGFR has been calculated using the CKD EPI equation. This calculation has not been validated in all clinical situations. eGFR's persistently <60 mL/min signify possible Chronic Kidney Disease.    Anion gap 15 5 - 15    Comment: Performed at [facility] [address], [place], [place] [address]  Glucose, capillary  Status: Abnormal   Collection Time: 11/13/17  6:19 AM  Result Value Ref Range   Glucose-Capillary 105 (H) 65 - 99 mg/dL  Glucose, capillary     Status: Abnormal   Collection Time: 11/13/17 12:11 PM  Result Value Ref Range   Glucose-Capillary 122 (H) 65 - 99 mg/dL   Comment 1 Document in Chart   Glucose, capillary     Status: Abnormal   Collection Time: 11/13/17  5:16 PM  Result Value Ref Range   Glucose-Capillary 126 (H) 65 - 99 mg/dL  Glucose, capillary     Status: Abnormal   Collection Time: 11/13/17  8:42 PM  Result Value Ref Range   Glucose-Capillary 134 (H) 65 - 99 mg/dL  Glucose, capillary     Status: None   Collection Time: 11/14/17  6:28 AM  Result Value Ref Range   Glucose-Capillary 82 65 - 99 mg/dL    General: NAD. Vital signs reviewed. HEENT: Normocephalic, atraumatic.  Cardio: RRR and no JVD. Resp: CTA B/L and Unlabored GI: BS positive and ND Musc/Skel:  No edmea or tenderness. Neuro: Alert Motor:  LUE: 5/5 proximal to distal RUE: 4+/5 proximal to distal (unchanged) RLE: HF: 2/5, ADF/PF 2/5 (unchanged) LLE: HF 2+/5, ADF/PF 2+/5 (unchanged) Skin: Intact. Warm and dry.    Assessment/Plan: 1. Functional deficits secondary to  debility and encephalopathy which require 3+ hours per day of interdisciplinary therapy in a comprehensive inpatient rehab setting. Physiatrist is providing close team supervision and 24 hour management of active medical problems listed below. Physiatrist and rehab team continue to assess barriers to discharge/monitor patient progress toward functional and medical goals. FIM: Function - Bathing Position: Bed Body parts bathed by patient: Right arm, Left arm, Chest, Abdomen, Front perineal area, Right upper leg, Left upper leg Body parts bathed by helper: Buttocks, Right lower leg, Left lower leg Assist Level: Touching or steadying assistance(Pt > 75%)  Function- Upper Body Dressing/Undressing What is the patient wearing?: Pull over shirt/dress, Bra Bra - Perfomed by patient: Thread/unthread left bra strap Bra - Perfomed by helper: Thread/unthread right bra strap, Hook/unhook bra (pull down sports bra), Thread/unthread left bra strap Pull over shirt/dress - Perfomed by patient: Thread/unthread right sleeve, Thread/unthread left sleeve, Put head through opening, Pull shirt over trunk Pull over shirt/dress - Perfomed by helper: Thread/unthread right sleeve, Thread/unthread left sleeve, Put head through opening, Pull shirt over trunk Assist Level: More than reasonable time, Set up Function - Lower Body Dressing/Undressing What is the patient wearing?: Pants Position: Bed Underwear - Performed by helper: Thread/unthread right underwear leg, Thread/unthread left underwear leg Pants- Performed by helper: Thread/unthread right pants leg, Thread/unthread left pants leg, Pull pants up/down Assist for footwear: Maximal assist Assist for lower body dressing: Touching or steadying assistance (Pt > 75%) Set up : To obtain clothing/put away  Function - Toileting Toileting activity did not occur: No continent bowel/bladder event Toileting steps completed by helper: Adjust clothing prior to toileting,  Performs perineal hygiene, Adjust clothing after toileting(per [place], NT report) Assist level: Touching or steadying assistance (Pt.75%)(per [place] Flirt [place], NT report)  Function - Air cabin crew transfer activity did not occur: Safety/medical concerns Assist level to toilet: 2 helpers(per [place], NT report)  Function - Chair/bed transfer Chair/bed transfer method: Lateral scoot Chair/bed transfer assist level: Total assist (Pt < 25%) Chair/bed transfer assistive device: Armrests Chair/bed transfer details: Verbal cues for precautions/safety, Verbal cues for safe use of DME/AE, Manual facilitation for weight shifting, Manual facilitation  for placement  Function - Locomotion: Wheelchair Will patient use wheelchair at discharge?: Yes Type: Manual Max wheelchair distance: 100' Assist Level: Touching or steadying assistance (Pt > 75%) Assist Level: Touching or steadying assistance (Pt > 75%) Wheel 150 feet activity did not occur: Safety/medical concerns Turns around,maneuvers to table,bed, and toilet,negotiates 3% grade,maneuvers on rugs and over doorsills: No Function - Locomotion: Ambulation Ambulation activity did not occur: Safety/medical concerns Walk 10 feet activity did not occur: Safety/medical concerns Walk 50 feet with 2 turns activity did not occur: Safety/medical concerns Walk 150 feet activity did not occur: Safety/medical concerns Walk 10 feet on uneven surfaces activity did not occur: Safety/medical concerns  Function - Comprehension Comprehension: Auditory Comprehension assist level: Understands basic 90% of the time/cues < 10% of the time  Function - Expression Expression: Verbal Expression assist level: Expresses basic 75 - 89% of the time/requires cueing 10 - 24% of the time. Needs helper to occlude trach/needs to repeat words.  Function - Social Interaction Social Interaction assist level: Interacts appropriately 75 - 89% of the time  - Needs redirection for appropriate language or to initiate interaction.  Function - Problem Solving Problem solving assist level: Solves basic 50 - 74% of the time/requires cueing 25 - 49% of the time  Function - Memory Memory assist level: Recognizes or recalls 25 - 49% of the time/requires cueing 50 - 75% of the time Patient normally able to recall (first 3 days only): That he or she is in a hospital, Current season  Medical Problem List and Plan: 1.Functional and mobility deficitssecondary to debility and encephalopathy from multiple medical issues   Cont CIR   Appreciated neurology recs, repeat MRI brain and T-spine suggesting stable/improvement. Neurology signed off. 2. DVT Prophylaxis/Anticoagulation: Pharmaceutical:Lovenox 3. Pain Management:tylenol prn 4. Mood:LCSW to follow for evaluation and support. 5. Neuropsych: This patientis not fullycapable of making decisions onherown behalf. 6. Skin/Wound Care:Air mattress for pressure relief measures. Local measures to help managesacral /labialMASD PRAFO's for bilateral LE's 7. Fluids/Electrolytes/Nutrition:Monitor I/O.    Continue to offer supplements to help with protein calorie malnutrition.   Cont Remeron 15 8. C diff colitis:    Vancomycincompleted 2/6 Continue enteric precautions Stools are improving 9. HTN: Monitor BP bid.    Nifedipine bid increased to 60 on 2/6   Imdur increased to 90 on 2/4   Metoprolol increased to 100 BID on 2/3  Blood pressures had been controlled except for the last 24 hours. Continue to monitor and make further changes if necessary after procedure.  Vitals:   11/13/17 1939 11/14/17 0514  BP: (!) 149/82 (!) 167/83  Pulse: 82 86  Resp:  18  Temp:  (!) 97.4 F (36.3 C)  SpO2:  96%  10 T2DM with retinopathy, neuropathy, andnephropathy:Monitor BS ac/hs.    SSI for elevated BS CBG (last 3)  Recent Labs    11/13/17 1716 11/13/17 2042  11/14/17 0628  GLUCAP 126* 134* 82    Relatively controlled on 2/10 11. H/o depression: Continue Effexor and Remeron.  12. Neurogenic bladder:Has chronic indwelling catheter for 2+ months.   D/ced foley and attempt to manage with I/O caths, Foley reinserted by nephro to monitor I/Os  13.AKI onCKD with history of overload:    Cr 2.64 on 2/9   Encourage fluids   IVF qhs x2 completed   Appreciate nephrology recs, following along. Sodium bicarbonate started 2/9   Cont to monitor 14. Diffuse CAD: treated medically with Metoprolol, Zetia and Plavix. 15.  ABLA on  anemia of chronic disease   Stool OB positive   Hb 11.7 after transfusion of 2 units on 2/8   Appreciate GI recs, plan for EGD/colonoscopy today.  16. Leukocytosis: Resolved 17. Hypoalbuminemia   Cont supplement 18. Hyperkalemia   K+ 5.1 on 2/9 after dose of Veltassa Per Nephrology   Cont to monitor 19. Abnormal urine analysis   UA is equivocal, urine culture pending   LOS (Days) 9 A FACE TO FACE EVALUATION WAS PERFORMED  Ankit Lorie Phenix 11/14/2017, 6:50 AM

## 2017-11-14 NOTE — Progress Notes (Signed)
Physical Therapy Session Note  Patient Details  Name: Jaquelin Meaney MRN: [id number] Date of Birth: Jan 10, 1961  Today's Date: 11/14/2017 PT Missed Time: 75 Minutes Missed Time Reason: Other (Comment)(of unit for procedure)   Skilled Therapeutic Interventions/Progress Updates:    Pt missed 60 minutes of skilled therapy tx this session secondary to being off unit for a procedure.    See Function Navigator for Current Functional Status.   Therapy/Group: Individual Therapy  Netta Corrigan, PT, DPT 11/14/2017, 7:47 AM

## 2017-11-14 NOTE — Progress Notes (Signed)
Patient ID: Laura [name], female   DOB: 1960/12/30, 57 y.o.   MRN: [id number] [facility] Progress Note   Assessment/ Plan:   1. Chronic kidney disease stage IV with rising BUN: Due to poor PO intake and upper gastrointestinal bleed from duodenal ulcers (non-bleeding at EGD). Renal function essentially unchanged overnight with some improvement of metabolic acidosis and decreased BUN that may be due to IVFs. Start PO NaHCO3 and supplement intake with IVFs. 2. Anemia: Status post PRBC transfusion overnight with possibly inaccurate hemoglobin/hematocrit yesterday. EGD showed non-bleeding duodenal ulcers as likely source of GIB/ABLA.  3. Hyperkalemia: Corrected with fluids/veltassa.  4. Fever: Status post completion of oral vancomycin for recent Clostridium difficile infection, remains on enteric precautions.  5. Hypertension:  blood pressure intermittently elevated- monitor on antihypertensive therapy s/p bowel prep. 6. 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   Subjective:   No acute events noted overnight. Needs in and out catheterization for urine--Foley not placed due to CAUTI risk.    Objective:   BP (!) 170/84 (BP Location: Right Arm)   Pulse 96   Temp 97.8 F (36.6 C) (Oral)   Resp 16   Ht 4\' 11"  (1.499 m)   Wt 58.5 kg (129 lb)   SpO2 94%   BMI 26.05 kg/m   Intake/Output Summary (Last 24 hours) at 11/14/2017 1217 Last data filed at 11/14/2017 1055 Gross per 24 hour  Intake 200 ml  Output 1350 ml  Net -1150 ml   Weight change:   Physical Exam: Gen: Comfortably resting in bed, back from EGD/colonoscopy earlier today CVS: Pulse regular rhythm, normal rate, S1 and S2 normal Resp: Anteriorly clear to auscultation, no rales/rhonchi Abd: Soft, flat, nontender Ext: Trace-1+ edema over lower extremities  Imaging: No results found.  Labs: BMET Recent Labs  Lab 11/09/17 0423  11/10/17 0647 11/12/17 0515 11/13/17 0501 11/14/17 0755  NA 139 141 140 141 143  K 5.3* 5.1 5.0 5.1 4.7  CL 109 110 110 110 109  CO2 20* 19* 20* 16* 18*  GLUCOSE 99 122* 96 115* 88  BUN 78* 80* 82* 81* 76*  CREATININE 2.51* 2.39* 2.58* 2.64* 2.55*  CALCIUM 8.1* 8.1* 8.2* 8.8* 8.8*  PHOS  --   --   --  7.3* 6.3*   CBC Recent Labs  Lab 11/09/17 0423 11/10/17 0647 11/12/17 0515 11/13/17 0501  WBC 10.7* 10.2 7.9 8.6  NEUTROABS 6.3 6.2 4.0  --   HGB 7.4* 7.7* 6.9* 11.7*  HCT 22.8* 23.8* 21.9* 35.9*  MCV 85.1 86.9 87.3 87.6  PLT 482* 472* 475* 488*    Medications:    . bisacodyl  10 mg Rectal Q0600  . collagenase   Topical Daily  . enoxaparin (LOVENOX) injection  30 mg Subcutaneous Q24H  . ezetimibe  10 mg Oral Daily  . feeding supplement (NEPRO CARB STEADY)  237 mL Oral TID WC  . feeding supplement (PRO-STAT SUGAR FREE 64)  30 mL Oral BID  . Gerhardt's butt cream   Topical QID  . insulin aspart  0-9 Units Subcutaneous TID WC  . isosorbide mononitrate  90 mg Oral Daily  . metoprolol tartrate  100 mg Oral BID  . mirtazapine  15 mg Oral QHS  . multivitamin  1 tablet Oral QHS  . NIFEdipine  60 mg Oral BID  . nystatin cream   Topical BID  . pantoprazole (PROTONIX) IV  40 mg Intravenous Q12H  . patiromer  8.4 g Oral  Daily  . sodium bicarbonate  650 mg Oral BID  . venlafaxine XR  150 mg Oral Q breakfast   Elmarie Shiley, MD 11/14/2017, 12:17 PM

## 2017-11-14 NOTE — Anesthesia Procedure Notes (Signed)
Procedure Name: MAC Date/Time: 11/14/2017 10:15 AM Performed by: Oletta Lamas, CRNA Pre-anesthesia Checklist: Patient identified, Emergency Drugs available, Suction available, Patient being monitored and Timeout performed Patient Re-evaluated:Patient Re-evaluated prior to induction Oxygen Delivery Method: Nasal cannula

## 2017-11-14 NOTE — Transfer of Care (Signed)
Immediate Anesthesia Transfer of Care Note  Patient: [place]  Procedure(s) Performed: ESOPHAGOGASTRODUODENOSCOPY (EGD) WITH PROPOFOL (N/A ) COLONOSCOPY WITH PROPOFOL (N/A )  Patient Location: Endoscopy Unit  Anesthesia Type:MAC  Level of Consciousness: drowsy, patient cooperative and responds to stimulation  Airway & Oxygen Therapy: Patient Spontanous Breathing and Patient connected to nasal cannula oxygen  Post-op Assessment: Report given to RN and Post -op Vital signs reviewed and stable  Post vital signs: Reviewed and stable  Last Vitals:  Vitals:   11/14/17 0514 11/14/17 1006  BP: (!) 167/83 (!) 184/97  Pulse: 86 96  Resp: 18 20  Temp: (!) 36.3 C 37.4 C  SpO2: 96% 95%    Last Pain:  Vitals:   11/14/17 1006  TempSrc: Oral  PainSc:       Patients Stated Pain Goal: 4 (70/62/37 6283)  Complications: No apparent anesthesia complications   Resting on left side eyes closed.

## 2017-11-14 NOTE — Anesthesia Postprocedure Evaluation (Signed)
Anesthesia Post Note  Patient: Laura [name]  Procedure(s) Performed: ESOPHAGOGASTRODUODENOSCOPY (EGD) WITH PROPOFOL (N/A ) COLONOSCOPY WITH PROPOFOL (N/A )     Patient location during evaluation: Endoscopy Anesthesia Type: MAC Level of consciousness: awake Pain management: pain level controlled Vital Signs Assessment: post-procedure vital signs reviewed and stable Respiratory status: spontaneous breathing Cardiovascular status: stable Anesthetic complications: no    Last Vitals:  Vitals:   11/14/17 1120 11/14/17 1133  BP:  (!) 170/84  Pulse: 94 96  Resp: 13 16  Temp:  36.6 C  SpO2: 95% 94%    Last Pain:  Vitals:   11/14/17 1133  TempSrc: Oral  PainSc:                  Laura [name]

## 2017-11-14 NOTE — Op Note (Signed)
[facility] Patient Name: [place] Procedure Date : 11/14/2017 MRN: [id number] Attending MD: Carlota Raspberry. Shawntelle Ungar MD, MD Date of Birth: [birth year] CSN: [id number] Age: 57 Admit Type: Inpatient Procedure:                Upper GI endoscopy Indications:              Gastrointestinal bleeding of unknown origin,                            anemia, on Plavix Providers:                Carlota Raspberry. Margaree Sandhu MD, MD, Elmer Ramp. Tilden Dome, RN,                            Laurena Spies, Technician Referring MD:              Medicines:                Monitored Anesthesia Care Complications:            No immediate complications. Estimated blood loss:                            None. Estimated Blood Loss:     Estimated blood loss: none. Procedure:                Pre-Anesthesia Assessment:                           - Prior to the procedure, a History and Physical                            was performed, and patient medications and                            allergies were reviewed. The patient's tolerance of                            previous anesthesia was also reviewed. The risks                            and benefits of the procedure and the sedation                            options and risks were discussed with the patient.                            All questions were answered, and informed consent                            was obtained. Prior Anticoagulants: The patient has                            taken Plavix (clopidogrel), last dose was 1 day  prior to procedure. ASA Grade Assessment: III - A                            patient with severe systemic disease. After                            reviewing the risks and benefits, the patient was                            deemed in satisfactory condition to undergo the                            procedure.                           After obtaining informed consent, the endoscope was               passed under direct vision. Throughout the                            procedure, the patient's blood pressure, pulse, and                            oxygen saturations were monitored continuously. The                            EG-[id number] ([id number]) scope was introduced through the                            mouth, and advanced to the second part of duodenum.                            The upper GI endoscopy was accomplished without                            difficulty. The patient tolerated the procedure                            well. Scope In: Scope Out: Findings:      Esophagogastric landmarks were identified: the Z-line was found at 35       cm, the gastroesophageal junction was found at 35 cm and the upper       extent of the gastric folds was found at 35 cm from the incisors.      The exam of the esophagus was otherwise normal.      The entire examined stomach was normal.      Three non-bleeding superficial duodenal ulcers were found in the       duodenal bulb. The largest lesion was 6 mm in largest dimension. 2 clean       based, one with small pigmented spot but no high risk stigmata for       bleeding. No heme noted anywhere in the bowel.      Two non-bleeding clean based superficial duodenal ulcers with no       stigmata of bleeding were found in the second portion of  the duodenum.       The largest lesion was 4 mm in largest dimension.      The exam of the duodenum was otherwise normal. Impression:               - Esophagogastric landmarks identified.                           - Normal esophagus                           - Normal stomach.                           - 3 non-bleeding duodenal ulcers in the duodenal                            bulb                           - 2 non-bleeding duodenal ulcers in the second                            portion of the duodenum                           Peptic ulcers are the likely cause of the patients                             anemia. Moderate Sedation:      No moderate sedation, case performed with MAC Recommendation:           - Return patient to hospital ward for ongoing care.                           - Soft diet.                           - Continue present medications.                           - Increase protonix to 30m IV BID until Hgb                            stablizes, then switch to PO                           - Hold Plavix today                           - please obtain H pylori IgG serology, and treat if                            positive                           - GI service will follow Procedure Code(s):        --- Professional ---  24469, Esophagogastroduodenoscopy, flexible,                            transoral; diagnostic, including collection of                            specimen(s) by brushing or washing, when performed                            (separate procedure) Diagnosis Code(s):        --- Professional ---                           K26.9, Duodenal ulcer, unspecified as acute or                            chronic, without hemorrhage or perforation                           K92.2, Gastrointestinal hemorrhage, unspecified CPT copyright 2016 [employer]. All rights reserved. The codes documented in this report are preliminary and upon coder review may  be revised to meet current compliance requirements. Remo Lipps P. Audria Takeshita MD, MD 11/14/2017 11:16:16 AM This report has been signed electronically. Number of Addenda: 0

## 2017-11-15 ENCOUNTER — Encounter (HOSPITAL_COMMUNITY): Payer: Self-pay | Admitting: Gastroenterology

## 2017-11-15 ENCOUNTER — Inpatient Hospital Stay (HOSPITAL_COMMUNITY): Payer: No Typology Code available for payment source | Admitting: Occupational Therapy

## 2017-11-15 ENCOUNTER — Inpatient Hospital Stay (HOSPITAL_COMMUNITY): Payer: No Typology Code available for payment source

## 2017-11-15 ENCOUNTER — Inpatient Hospital Stay (HOSPITAL_COMMUNITY): Payer: No Typology Code available for payment source | Admitting: Speech Pathology

## 2017-11-15 DIAGNOSIS — Z7901 Long term (current) use of anticoagulants: Secondary | ICD-10-CM

## 2017-11-15 DIAGNOSIS — K264 Chronic or unspecified duodenal ulcer with hemorrhage: Secondary | ICD-10-CM

## 2017-11-15 DIAGNOSIS — D123 Benign neoplasm of transverse colon: Secondary | ICD-10-CM

## 2017-11-15 LAB — RENAL FUNCTION PANEL
ALBUMIN: 2.1 g/dL — AB (ref 3.5–5.0)
Anion gap: 12 (ref 5–15)
BUN: 69 mg/dL — ABNORMAL HIGH (ref 6–20)
CALCIUM: 8.4 mg/dL — AB (ref 8.9–10.3)
CO2: 19 mmol/L — ABNORMAL LOW (ref 22–32)
CREATININE: 2.35 mg/dL — AB (ref 0.44–1.00)
Chloride: 111 mmol/L (ref 101–111)
GFR calc Af Amer: 25 mL/min — ABNORMAL LOW (ref >60)
GFR, EST NON AFRICAN AMERICAN: 22 mL/min — AB (ref >60)
Glucose, Bld: 92 mg/dL (ref 65–99)
PHOSPHORUS: 5.5 mg/dL — AB (ref 2.5–4.6)
Potassium: 4.4 mmol/L (ref 3.5–5.1)
SODIUM: 142 mmol/L (ref 135–145)

## 2017-11-15 LAB — GLUCOSE, CAPILLARY
Glucose-Capillary: 95 mg/dL (ref 65–99)
Glucose-Capillary: 129 mg/dL — ABNORMAL HIGH (ref 65–99)
Glucose-Capillary: 129 mg/dL — ABNORMAL HIGH (ref 65–99)
Glucose-Capillary: 145 mg/dL — ABNORMAL HIGH (ref 65–99)

## 2017-11-15 LAB — OCCULT BLOOD X 1 CARD TO LAB, STOOL: FECAL OCCULT BLD: NEGATIVE

## 2017-11-15 MED ORDER — CIPROFLOXACIN HCL 500 MG PO TABS
250.0000 mg | ORAL_TABLET | Freq: Two times a day (BID) | ORAL | Status: AC
  Administered 2017-11-15 – 2017-11-17 (×6): 250 mg via ORAL
  Filled 2017-11-15 (×6): qty 1

## 2017-11-15 NOTE — Progress Notes (Signed)
Occupational Therapy Weekly Progress Note  Patient Details  Name: Laura [name] MRN: [id number] Date of Birth: 1961-05-15  Beginning of progress report period: November 06, 2017 End of progress report period: November 15, 2017  Today's Date: 11/15/2017 OT Individual Time: 6579-0383 OT Individual Time Calculation (min): 70 min    Patient has met 2 of 5 short term goals.  Pt's progress has been limited due to increased fatigue and lethargy, fluctuations in mentation/orientation, and medical complexity.  Pt currently requires +2 for transfers and reports dizziness with upright sitting.  Requires blocking of BLE in standing due to decreased strength and overall weakness.  Pt currently completes UB dressing with min assist, however continues to require +2 assist for LB bathing/dressing.  Patient continues to demonstrate the following deficits: muscle weakness, decreased safety awareness and decreased memory,  and decreased sitting balance, decreased standing balance, decreased postural control and decreased balance strategies and therefore will continue to benefit from skilled OT intervention to enhance overall performance with BADL and Reduce care partner burden.  Patient progressing toward long term goals..  Continue plan of care.  OT Short Term Goals Week 1:  OT Short Term Goal 1 (Week 1): Pt will completed UB dressing with minA  OT Short Term Goal 1 - Progress (Week 1): Met OT Short Term Goal 2 (Week 1): Pt will complete LB dressing with modA OT Short Term Goal 2 - Progress (Week 1): Progressing toward goal OT Short Term Goal 3 (Week 1): Pt will perform shower transfer with mod A  OT Short Term Goal 3 - Progress (Week 1): Progressing toward goal OT Short Term Goal 4 (Week 1): Pt will particpiate in OOB activities for 30 mins OT Short Term Goal 4 - Progress (Week 1): Met OT Short Term Goal 5 (Week 1): Pt will complete grooming at sink side with min A  OT Short Term Goal 5 - Progress (Week  1): Progressing toward goal Week 2:  OT Short Term Goal 1 (Week 2): Pt will complete LB dressing with modA OT Short Term Goal 2 (Week 2): Pt will perform shower transfer with mod A  OT Short Term Goal 3 (Week 2): Pt with complete sit <> stand with max assist of one caregiver OT Short Term Goal 4 (Week 2): Pt will complete grooming at sink side with min A   Skilled Therapeutic Interventions/Progress Updates:    Treatment session with focus on self-care retraining and functional mobility.  Pt received in TIS w/c with friend present, pt with decreased motivation to participate in session however friend encouraged pt to participate.  Completed bathing and dressing at sink side.  Pt completed UB bathing and dressing with min cues for sustained attention as pt internally distracted regarding current life circumstances.  Required +2 for LB bathing and dressing with blocking at feet and knees due to decreased strength and motor control.  Manual facilitation at hips to promote upright standing posture.  Educated pt on BUE exercises with level 1 theraband, pt able to return demonstration and complete 2 reps of 10 each exercise.  Left reclined in TIS w/c awaiting next therapy session.  Pt overall more alert and participatory throughout session.  Therapy Documentation Precautions:  Precautions Precautions: Fall Precaution Comments: monitor blood pressure Restrictions Weight Bearing Restrictions: No Pain:  Pt with no c/o pain  See Function Navigator for Current Functional Status.   Therapy/Group: Individual Therapy  [name] 11/15/2017, 9:49 AM

## 2017-11-15 NOTE — Progress Notes (Signed)
Patient ID: Laura [name], female   DOB: 03/25/1961, 57 y.o.   MRN: [id number] S:No new complaints O:BP (!) 153/75   Pulse 95   Temp 98 F (36.7 C) (Oral)   Resp 18   Ht 4\' 11"  (1.499 m)   Wt 58.5 kg (129 lb)   SpO2 97%   BMI 26.05 kg/m   Intake/Output Summary (Last 24 hours) at 11/15/2017 1302 Last data filed at 11/15/2017 0814 Gross per 24 hour  Intake 460 ml  Output 1225 ml  Net -765 ml   Intake/Output: I/O last 3 completed shifts: In: 660 [P.O.:460; I.V.:200] Out: 1650 [Urine:1650]  Intake/Output this shift:  Total I/O In: -  Out: 275 [Urine:275] Weight change: 0 kg (0 lb) Gen: NAD CVS: no rub Resp: CTA Abd: benign Ext: trace pretibial edema  Recent Labs  Lab 11/09/17 0423 11/10/17 0647 11/12/17 0515 11/13/17 0501 11/14/17 0755 11/15/17 0511  NA 139 141 140 141 143 142  K 5.3* 5.1 5.0 5.1 4.7 4.4  CL 109 110 110 110 109 111  CO2 20* 19* 20* 16* 18* 19*  GLUCOSE 99 122* 96 115* 88 92  BUN 78* 80* 82* 81* 76* 69*  CREATININE 2.51* 2.39* 2.58* 2.64* 2.55* 2.35*  ALBUMIN  --   --   --  2.5* 2.4* 2.1*  CALCIUM 8.1* 8.1* 8.2* 8.8* 8.8* 8.4*  PHOS  --   --   --  7.3* 6.3* 5.5*   Liver Function Tests: Recent Labs  Lab 11/13/17 0501 11/14/17 0755 11/15/17 0511  ALBUMIN 2.5* 2.4* 2.1*   No results for input(s): LIPASE, AMYLASE in the last 168 hours. No results for input(s): AMMONIA in the last 168 hours. CBC: Recent Labs  Lab 11/09/17 0423 11/10/17 0647 11/12/17 0515 11/13/17 0501  WBC 10.7* 10.2 7.9 8.6  NEUTROABS 6.3 6.2 4.0  --   HGB 7.4* 7.7* 6.9* 11.7*  HCT 22.8* 23.8* 21.9* 35.9*  MCV 85.1 86.9 87.3 87.6  PLT 482* 472* 475* 488*   Cardiac Enzymes: No results for input(s): CKTOTAL, CKMB, CKMBINDEX, TROPONINI in the last 168 hours. CBG: Recent Labs  Lab 11/14/17 1212 11/14/17 1624 11/14/17 2135 11/15/17 0631 11/15/17 1143  GLUCAP 86 85 113* 95 129*    Iron Studies: No results for input(s): IRON, TIBC, TRANSFERRIN, FERRITIN in the  last 72 hours. Studies/Results: No results found. . bisacodyl  10 mg Rectal Q0600  . ciprofloxacin  250 mg Oral BID  . collagenase   Topical Daily  . enoxaparin (LOVENOX) injection  30 mg Subcutaneous Q24H  . ezetimibe  10 mg Oral Daily  . feeding supplement (NEPRO CARB STEADY)  237 mL Oral TID WC  . feeding supplement (PRO-STAT SUGAR FREE 64)  30 mL Oral BID  . insulin aspart  0-9 Units Subcutaneous TID WC  . isosorbide mononitrate  90 mg Oral Daily  . metoprolol tartrate  100 mg Oral BID  . mirtazapine  15 mg Oral QHS  . multivitamin  1 tablet Oral QHS  . NIFEdipine  60 mg Oral BID  . pantoprazole (PROTONIX) IV  40 mg Intravenous Q12H  . patiromer  8.4 g Oral Daily  . sodium bicarbonate  650 mg Oral BID  . venlafaxine XR  150 mg Oral Q breakfast    BMET    Component Value Date/Time   NA 142 11/15/2017 0511   K 4.4 11/15/2017 0511   CL 111 11/15/2017 0511   CO2 19 (L) 11/15/2017 0511   GLUCOSE 92 11/15/2017 0511  BUN 69 (H) 11/15/2017 0511   CREATININE 2.35 (H) 11/15/2017 0511   CALCIUM 8.4 (L) 11/15/2017 0511   GFRNONAA 22 (L) 11/15/2017 0511   GFRAA 25 (L) 11/15/2017 0511   CBC    Component Value Date/Time   WBC 8.6 11/13/2017 0501   RBC 4.10 11/13/2017 0501   HGB 11.7 (L) 11/13/2017 0501   HCT 35.9 (L) 11/13/2017 0501   PLT 488 (H) 11/13/2017 0501   MCV 87.6 11/13/2017 0501   MCH 28.5 11/13/2017 0501   MCHC 32.6 11/13/2017 0501   RDW 15.3 11/13/2017 0501   LYMPHSABS 3.1 11/12/2017 0515   MONOABS 0.5 11/12/2017 0515   EOSABS 0.4 11/12/2017 0515   BASOSABS 0.1 11/12/2017 0515     Assessment/Plan:  1. AKI/CKD stage 4- related to poor po intake and UGI bleed from duodenal ulcers.  Renal function continues to improve. 2. ABLA due to GI Bleed and anemia of CKD stage 4- s/p transfusion 3. Metabolic acidosis due to #1- improving with po bicarb. 4. Fever- s/p completion of po vancomycin for recent C Diff 5. HTN- stable 6. Severe deconditioning/debilitation-  cont with PT/OT with inpatient rehab.  Donetta Potts, MD [employer] [phone]

## 2017-11-15 NOTE — Progress Notes (Signed)
SLP Cancellation Note  Patient Details Name: Laura [name] MRN: [id number] DOB: 07/30/1961   Cancelled treatment:       Patient missed 30 minutes of skilled SLP intervention due to fatigue and reports of her stomach hurting. RN aware. Continue with current plan of care.                                                                                                 [place], [place] 11/15/2017, 8:49 AM

## 2017-11-15 NOTE — Progress Notes (Signed)
Speech Language Pathology Daily Session Note  Patient Details  Name: Laura [name] MRN: [id number] Date of Birth: [birth year]  Today's Date: 11/15/2017 SLP Individual Time: 1430-1500 SLP Individual Time Calculation (min): 30 min  Short Term Goals: Week 2: SLP Short Term Goal 1 (Week 2): Pt will utilize external memory aides to recall new dialy information with Mod A cues.  SLP Short Term Goal 2 (Week 2): Pt will complete basic familiar problem solving task related to ADL with Min A cues.  SLP Short Term Goal 3 (Week 2): Pt will sustain attention to basic familiar task for ~ 10 minutes with Min A cues.  SLP Short Term Goal 4 (Week 2): Pt will increase vocal intensity to achieve intelligibility in mildly noisy environments with mod verbal cues.    Skilled Therapeutic Interventions: Skilled treatment session focused on cognition goals. SLP facilitated session by providing physical calendar for pt to refer to when recalling orientation information. Pt required Mod A to utilize calendar. Pt able to sustain attention to calendar task for ~ 20 minutes with Min A cues. Pt was left upright in wheelchair with safety belt donned and all needs within reach. Continue per current plan of care.      Function:    Cognition Comprehension Comprehension assist level: Understands basic 75 - 89% of the time/ requires cueing 10 - 24% of the time;Understands basic 50 - 74% of the time/ requires cueing 25 - 49% of the time  Expression   Expression assist level: Expresses basic 75 - 89% of the time/requires cueing 10 - 24% of the time. Needs helper to occlude trach/needs to repeat words.  Social Interaction Social Interaction assist level: Interacts appropriately 75 - 89% of the time - Needs redirection for appropriate language or to initiate interaction.  Problem Solving Problem solving assist level: Solves basic 50 - 74% of the time/requires cueing 25 - 49% of the time  Memory Memory assist level: Recognizes or  recalls 50 - 74% of the time/requires cueing 25 - 49% of the time    Pain    Therapy/Group: Individual Therapy  Orlanda Lemmerman 11/15/2017, 2:59 PM

## 2017-11-15 NOTE — Progress Notes (Signed)
Subjective/Complaints: Patient seen lying in bed this AM.  She slept well overnight, remains sleepy this AM.  Yesterday, nursing with concerns regarding confusion post-procedure.   Review of systems: Limited due to lethargy  Objective: Vital Signs: Blood pressure (!) 165/85, pulse 80, temperature 98 F (36.7 C), temperature source Oral, resp. rate 18, height 4' 11"  (1.499 m), weight 58.5 kg (129 lb), SpO2 97 %. No results found. Results for orders placed or performed during the hospital encounter of 11/05/17 (from the past 72 hour(s))  Glucose, capillary     Status: None   Collection Time: 11/12/17 11:10 AM  Result Value Ref Range   Glucose-Capillary 98 65 - 99 mg/dL   Comment 1 Notify RN   Prepare RBC     Status: None   Collection Time: 11/12/17 11:58 AM  Result Value Ref Range   Order Confirmation      ORDER PROCESSED BY BLOOD BANK BB SAMPLE OR UNITS ALREADY AVAILABLE Performed at [facility], [address] [place], [place] [address]   Glucose, capillary     Status: None   Collection Time: 11/12/17  4:38 PM  Result Value Ref Range   Glucose-Capillary 90 65 - 99 mg/dL   Comment 1 Notify RN   Glucose, capillary     Status: Abnormal   Collection Time: 11/12/17  9:26 PM  Result Value Ref Range   Glucose-Capillary 119 (H) 65 - 99 mg/dL  Sodium, urine, random     Status: None   Collection Time: 11/12/17 11:38 PM  Result Value Ref Range   Sodium, Ur 77 mmol/L    Comment: Performed at [facility] [address], [place], [place] [address]  Urea nitrogen, urine     Status: None   Collection Time: 11/12/17 11:38 PM  Result Value Ref Range   Urea Nitrogen, Ur 355 Not Estab. mg/dL    Comment: (NOTE) Performed At: [facility] [place], [place] [id number] Rush Farmer MD [id number] Performed at [facility], [place] [address] [place], [place] [address]   Creatinine, urine, random     Status: None   Collection Time: 11/12/17 11:38  PM  Result Value Ref Range   Creatinine, Urine 34.94 mg/dL    Comment: Performed at [facility] [address] [place], [place] [address]  Urinalysis, Routine w reflex microscopic     Status: Abnormal   Collection Time: 11/12/17 11:39 PM  Result Value Ref Range   Color, Urine YELLOW YELLOW   APPearance CLEAR CLEAR   Specific Gravity, Urine 1.012 1.005 - 1.030   pH 6.0 5.0 - 8.0   Glucose, UA NEGATIVE NEGATIVE mg/dL   Hgb urine dipstick NEGATIVE NEGATIVE   Bilirubin Urine NEGATIVE NEGATIVE   Ketones, ur NEGATIVE NEGATIVE mg/dL   Protein, ur 100 (A) NEGATIVE mg/dL   Nitrite NEGATIVE NEGATIVE   Leukocytes, UA SMALL (A) NEGATIVE   RBC / HPF 0-5 0 - 5 RBC/hpf   WBC, UA 6-30 0 - 5 WBC/hpf   Bacteria, UA MANY (A) NONE SEEN   Squamous Epithelial / LPF NONE SEEN NONE SEEN    Comment: Performed at [facility] [address] [place], [place] [address]  Urine Culture     Status: Abnormal (Preliminary result)   Collection Time: 11/12/17 11:49 PM  Result Value Ref Range   Specimen Description URINE, CLEAN CATCH    Special Requests      NONE Performed at [facility], [place] [address], [place], [place] [address]    Culture (A)     >=100,000 COLONIES/mL PSEUDOMONAS AERUGINOSA 20,000 COLONIES/mL GRAM NEGATIVE RODS    Report Status PENDING    Organism ID, Bacteria PSEUDOMONAS AERUGINOSA (A)       Susceptibility   Pseudomonas aeruginosa - MIC*    CEFTAZIDIME 4 SENSITIVE Sensitive     CIPROFLOXACIN 0.5 SENSITIVE Sensitive     GENTAMICIN 4 SENSITIVE Sensitive     IMIPENEM 2 SENSITIVE Sensitive     PIP/TAZO 64 SENSITIVE Sensitive     CEFEPIME 4 SENSITIVE Sensitive     * >=100,000 COLONIES/mL PSEUDOMONAS AERUGINOSA  CBC     Status: Abnormal   Collection Time: 11/13/17  5:01 AM  Result Value Ref Range   WBC 8.6 4.0 - 10.5 K/uL   RBC 4.10 3.87 - 5.11 MIL/uL   Hemoglobin 11.7 (L) 12.0 - 15.0 g/dL    Comment: DELTA CHECK NOTED POST TRANSFUSION SPECIMEN    HCT 35.9  (L) 36.0 - 46.0 %   MCV 87.6 78.0 - 100.0 fL   MCH 28.5 26.0 - 34.0 pg   MCHC 32.6 30.0 - 36.0 g/dL   RDW 15.3 11.5 - 15.5 %   Platelets 488 (H) 150 - 400 K/uL    Comment: Performed at [facility], [place] [address] [place], [place] [address]  Renal function panel     Status: Abnormal   Collection Time: 11/13/17  5:01 AM  Result Value Ref Range   Sodium 141 135 - 145 mmol/L   Potassium 5.1 3.5 - 5.1 mmol/L   Chloride 110 101 - 111 mmol/L   CO2 16 (L) 22 - 32 mmol/L   Glucose, Bld 115 (H) 65 - 99 mg/dL   BUN 81 (H) 6 - 20 mg/dL   Creatinine, Ser 2.64 (H) 0.44 - 1.00 mg/dL   Calcium 8.8 (L) 8.9 - 10.3 mg/dL   Phosphorus 7.3 (H) 2.5 - 4.6 mg/dL   Albumin 2.5 (L) 3.5 - 5.0 g/dL   GFR calc non Af Amer 19 (L) >60 mL/min   GFR calc Af Amer 22 (L) >60 mL/min    Comment: (NOTE) The eGFR has been calculated using the CKD EPI equation. This calculation has not been validated in all clinical situations. eGFR's persistently <60 mL/min signify possible Chronic Kidney Disease.    Anion gap 15 5 - 15    Comment: Performed at [facility] [address] [place], [place] [address]  Glucose, capillary     Status: Abnormal   Collection Time: 11/13/17  6:19 AM  Result Value Ref Range   Glucose-Capillary 105 (H) 65 - 99 mg/dL  Glucose, capillary     Status: Abnormal   Collection Time: 11/13/17 12:11 PM  Result Value Ref Range   Glucose-Capillary 122 (H) 65 - 99 mg/dL   Comment 1 Document in Chart   Glucose, capillary     Status: Abnormal   Collection Time: 11/13/17  5:16 PM  Result Value Ref Range   Glucose-Capillary 126 (H) 65 - 99 mg/dL  Glucose, capillary     Status: Abnormal   Collection Time: 11/13/17  8:42 PM  Result Value Ref Range   Glucose-Capillary 134 (H) 65 - 99 mg/dL  Glucose, capillary     Status: None   Collection Time: 11/14/17  6:28 AM  Result Value Ref Range   Glucose-Capillary 82 65 - 99 mg/dL  Renal function panel     Status: Abnormal  Collection Time:  11/14/17  7:55 AM  Result Value Ref Range   Sodium 143 135 - 145 mmol/L   Potassium 4.7 3.5 - 5.1 mmol/L   Chloride 109 101 - 111 mmol/L   CO2 18 (L) 22 - 32 mmol/L   Glucose, Bld 88 65 - 99 mg/dL   BUN 76 (H) 6 - 20 mg/dL   Creatinine, Ser 2.55 (H) 0.44 - 1.00 mg/dL   Calcium 8.8 (L) 8.9 - 10.3 mg/dL   Phosphorus 6.3 (H) 2.5 - 4.6 mg/dL   Albumin 2.4 (L) 3.5 - 5.0 g/dL   GFR calc non Af Amer 20 (L) >60 mL/min   GFR calc Af Amer 23 (L) >60 mL/min    Comment: (NOTE) The eGFR has been calculated using the CKD EPI equation. This calculation has not been validated in all clinical situations. eGFR's persistently <60 mL/min signify possible Chronic Kidney Disease.    Anion gap 16 (H) 5 - 15    Comment: Performed at [facility], [place] [address] [place], [place] [address]  Glucose, capillary     Status: None   Collection Time: 11/14/17  9:12 AM  Result Value Ref Range   Glucose-Capillary 89 65 - 99 mg/dL   Comment 1 Notify RN   Glucose, capillary     Status: None   Collection Time: 11/14/17 12:12 PM  Result Value Ref Range   Glucose-Capillary 86 65 - 99 mg/dL  Glucose, capillary     Status: None   Collection Time: 11/14/17  4:24 PM  Result Value Ref Range   Glucose-Capillary 85 65 - 99 mg/dL   Comment 1 Notify RN   Glucose, capillary     Status: Abnormal   Collection Time: 11/14/17  9:35 PM  Result Value Ref Range   Glucose-Capillary 113 (H) 65 - 99 mg/dL  Occult blood card to lab, stool     Status: None   Collection Time: 11/15/17 12:55 AM  Result Value Ref Range   Fecal Occult Bld NEGATIVE NEGATIVE    Comment: Performed at [facility], [place] [address] [place], [place] [address]  Renal function panel     Status: Abnormal   Collection Time: 11/15/17  5:11 AM  Result Value Ref Range   Sodium 142 135 - 145 mmol/L   Potassium 4.4 3.5 - 5.1 mmol/L   Chloride 111 101 - 111 mmol/L   CO2 19 (L) 22 - 32 mmol/L   Glucose, Bld 92 65 - 99 mg/dL   BUN 69 (H) 6 - 20  mg/dL   Creatinine, Ser 2.35 (H) 0.44 - 1.00 mg/dL   Calcium 8.4 (L) 8.9 - 10.3 mg/dL   Phosphorus 5.5 (H) 2.5 - 4.6 mg/dL   Albumin 2.1 (L) 3.5 - 5.0 g/dL   GFR calc non Af Amer 22 (L) >60 mL/min   GFR calc Af Amer 25 (L) >60 mL/min    Comment: (NOTE) The eGFR has been calculated using the CKD EPI equation. This calculation has not been validated in all clinical situations. eGFR's persistently <60 mL/min signify possible Chronic Kidney Disease.    Anion gap 12 5 - 15    Comment: Performed at [facility] [address], [place], [place] [address]  Glucose, capillary     Status: None   Collection Time: 11/15/17  6:31 AM  Result Value Ref Range   Glucose-Capillary 95 65 - 99 mg/dL    General: NAD. Vital signs reviewed. HEENT: Normocephalic, atraumatic.  Cardio: RRR and no JVD. Resp: CTA B/L and Unlabored GI: BS positive and ND Musc/Skel:  No edmea or tenderness. Neuro: Alert Motor:  LUE: 5/5 proximal to distal RUE: 4+/5 proximal to distal (stable) RLE: HF: 2/5, ADF/PF 2/5 (stable) LLE: HF 2+/5, ADF/PF 2+/5 (stable) Skin: Intact. Warm and dry.    Assessment/Plan: 1. Functional deficits secondary to debility and encephalopathy which require 3+ hours per day of interdisciplinary therapy in a comprehensive inpatient rehab setting. Physiatrist is providing close team supervision and 24 hour management of active medical problems listed below. Physiatrist and rehab team continue to assess barriers to discharge/monitor patient progress toward functional and medical goals. FIM: Function - Bathing Position: Bed Body parts bathed by patient: Right arm, Left arm, Chest, Abdomen, Front perineal area, Right upper leg, Left upper leg Body parts bathed by helper: Buttocks, Right lower leg, Left lower leg Assist Level: Touching or steadying assistance(Pt > 75%)  Function- Upper Body Dressing/Undressing What is the patient wearing?: Pull over shirt/dress, Bra Bra - Perfomed by  patient: Thread/unthread left bra strap Bra - Perfomed by helper: Thread/unthread right bra strap, Hook/unhook bra (pull down sports bra), Thread/unthread left bra strap Pull over shirt/dress - Perfomed by patient: Thread/unthread right sleeve, Thread/unthread left sleeve, Put head through opening, Pull shirt over trunk Pull over shirt/dress - Perfomed by helper: Thread/unthread right sleeve, Thread/unthread left sleeve, Put head through opening, Pull shirt over trunk Assist Level: More than reasonable time, Set up Function - Lower Body Dressing/Undressing What is the patient wearing?: Pants Position: Bed Underwear - Performed by helper: Thread/unthread right underwear leg, Thread/unthread left underwear leg Pants- Performed by helper: Thread/unthread right pants leg, Thread/unthread left pants leg, Pull pants up/down Assist for footwear: Maximal assist Assist for lower body dressing: Touching or steadying assistance (Pt > 75%) Set up : To obtain clothing/put away  Function - Toileting Toileting activity did not occur: No continent bowel/bladder event Toileting steps completed by helper: Adjust clothing prior to toileting, Performs perineal hygiene, Adjust clothing after toileting(per [place], [place] report) Assist level: Touching or steadying assistance (Pt.75%)(per [place], NT report)  Function - Air cabin crew transfer activity did not occur: Safety/medical concerns Assist level to toilet: 2 helpers(per [place], NT report)  Function - Chair/bed transfer Chair/bed transfer method: Lateral scoot Chair/bed transfer assist level: Total assist (Pt < 25%) Chair/bed transfer assistive device: Armrests Chair/bed transfer details: Verbal cues for precautions/safety, Verbal cues for safe use of DME/AE, Manual facilitation for weight shifting, Manual facilitation for placement  Function - Locomotion: Wheelchair Will patient use wheelchair at discharge?:  Yes Type: Manual Max wheelchair distance: 100' Assist Level: Touching or steadying assistance (Pt > 75%) Assist Level: Touching or steadying assistance (Pt > 75%) Wheel 150 feet activity did not occur: Safety/medical concerns Turns around,maneuvers to table,bed, and toilet,negotiates 3% grade,maneuvers on rugs and over doorsills: No Function - Locomotion: Ambulation Ambulation activity did not occur: Safety/medical concerns Walk 10 feet activity did not occur: Safety/medical concerns Walk 50 feet with 2 turns activity did not occur: Safety/medical concerns Walk 150 feet activity did not occur: Safety/medical concerns Walk 10 feet on uneven surfaces activity did not occur: Safety/medical concerns  Function - Comprehension Comprehension: Auditory Comprehension assist level: Understands basic 90% of the time/cues < 10% of the time  Function - Expression Expression: Verbal Expression assist level: Expresses basic 75 - 89% of the time/requires cueing 10 - 24% of the time. Needs helper to occlude trach/needs to repeat words.  Function - Social Interaction Social Interaction assist level: Interacts appropriately 75 - 89% of the time - Needs redirection for appropriate language or to initiate interaction.  Function - Problem Solving Problem solving assist level: Solves basic 50 - 74% of the time/requires cueing 25 - 49% of the time  Function - Memory Memory assist level: Recognizes or recalls 25 - 49% of the time/requires cueing 50 - 75% of the time Patient normally able to recall (first 3 days only): That he or she is in a hospital, Current season  Medical Problem List and Plan: 1.Functional and mobility deficitssecondary to debility and encephalopathy from multiple medical issues   Cont CIR   Appreciated neurology recs, repeat MRI brain and T-spine suggesting stable/improvement. Neurology signed off. 2. DVT Prophylaxis/Anticoagulation: Pharmaceutical:Lovenox 3. Pain  Management:tylenol prn 4. Mood:LCSW to follow for evaluation and support. 5. Neuropsych: This patientis not fullycapable of making decisions onherown behalf. 6. Skin/Wound Care:Air mattress for pressure relief measures. Local measures to help managesacral /labialMASD PRAFO's for bilateral LE's 7. Fluids/Electrolytes/Nutrition:Monitor I/O.    Continue to offer supplements to help with protein calorie malnutrition.   Cont Remeron 15 8. C diff colitis:    Vancomycincompleted 2/6 Continue enteric precautions Stools are improving 9. HTN: Monitor BP bid.    Nifedipine bid increased to 60 on 2/6   Imdur increased to 90 on 2/4   Metoprolol increased to 100 BID on 2/3   Elevated this AM, will monitor and consider adjustments tomorrow if necessary Vitals:   11/14/17 1507 11/15/17 0307  BP: (!) 169/83 (!) 165/85  Pulse: 83 80  Resp: 18 18  Temp: 98.1 F (36.7 C) 98 F (36.7 C)  SpO2: 94% 97%  10 T2DM with retinopathy, neuropathy, andnephropathy:Monitor BS ac/hs.    SSI for elevated BS CBG (last 3)  Recent Labs    11/14/17 1624 11/14/17 2135 11/15/17 0631  GLUCAP 85 113* 95    Relatively controlled on 2/11 11. H/o depression: Continue Effexor and Remeron.  12. Neurogenic bladder:Has chronic indwelling catheter for 2+ months.   D/ced foley and attempt to manage with I/O caths, Foley reinserted by nephro to monitor I/Os  13.AKI onCKD with history of overload:    Cr 2.35 on 2/11   Encourage fluids   IVF qhs x2 completed   Appreciate nephrology recs, following along, sodium bicarb.    Cont to monitor 14. Diffuse CAD: treated medically with Metoprolol, Zetia and Plavix. 15.  ABLA on anemia of chronic disease   Stool OB now neg   Hb 11.7 after transfusion of 2 units on 2/8   Appreciate GI recs, EGD/colonoscopy performed on 2/10 showing a total of 5 duodenal ulcers, which are presumed to be a source of bleeding.  16. Leukocytosis:  Resolved 17. Hypoalbuminemia   Cont supplement 18. Hyperkalemia   K+ 4.4 on 2/11   Cont to monitor 19. Abnormal urine analysis   UA is equivocal, urine culture with Pseudomonas   Cipro started 2/11-2/13 20.  GI bleed   Believed to be secondary to duodenal ulcers   Appreciate GI recs, recommend 40 mg IV Protonix twice daily until hemoglobin stable, then p.o.   Recommended H. pylori serology, ordered   Polyp biopsy on colonoscopy pending  LOS (Days) 10 A FACE TO FACE EVALUATION WAS PERFORMED  Ankit Lorie Phenix 11/15/2017, 9:54 AM

## 2017-11-15 NOTE — Progress Notes (Addendum)
Physical Therapy Note  Patient Details  Name: Laura [name] MRN: [id number] Date of Birth: March 08, 1961 Today's Date: 11/15/2017  0900-1000, 72  Min individual tx Pain: none per pt, but c/o of nausea.  Pt informed Pryor Montes, RN  Pt lethargic, but stated she would participate.  Supine exs 10 x 1 active assistive: (neuro re-ed for RLE via demo and multimodal cues) R straight leg raises, R hip abduction, adduction, R heel slide, R short arc quad knee ext, bil hip internal rotation, bil bridging with minimal lift.  10 x 1 L straight leg raises, short arc quad knee ext, 20 x 1 R/L ankle pumps.    Using bed features and mod assist, pt sat EOB on L x 5 min.  She c/o of dizziness.  See vitals.  Slide board level transfer +2 needed due to pt sliding forwad during transfer.  Pt left resting with w/c tilted back to approx 40 degrees.  Seat alarm set and quick release belt donned.  All needs left at hand.  Pt stated her dizziness resolved with tilted back posiition.  See function navigator for current status.  Zarai Orsborn 11/15/2017, 7:53 AM

## 2017-11-15 NOTE — Progress Notes (Signed)
   Patient Name: Laura [name] Date of Encounter: 11/15/2017, 11:01 AM    Subjective  Patient is a 57 yo female seen in the hospital with PMH of several conditions- HTN, DM2, CKD stg IV, nephrotic syndrome, fluid overload, encephalopathy, CAD, anasarca, cholelithiasis, neurogenic bladder, and acute disseminated encephalomyelitis. Her current hospitalization is due to a C.diff colitis episode, AKI, sacral ulcer, bacteriuria, HF, and anemia. C.diff tx'd with vancomycin, she completed her abx course on 2/9. Most recent Hgb 2/9 was 11.7, improved from 6.9 on 2/8, transfused 2 units prbc on 2/8.  Pt currently on protonix 40 mg BID, no longer having any rectal bleeding as of today. She was FOBT negative today. She is complaining of nausea that started today with 4 episodes of vomiting yesterday She reports loss of appetite. No abdominal pain, diarrhea, fever or other sxs.    Objective  BP (!) 153/75   Pulse 95   Temp 98 F (36.7 C) (Oral)   Resp 18   Ht 4\' 11"  (1.499 m)   Wt 58.5 kg (129 lb)   SpO2 97%   BMI 26.05 kg/m   General: somnolent appearing but easily aroused Lungs: CTAB CV: RRR GI: soft, non- tender, no TTP, Pos BS Neuro/Psych: alert, oriented, speech is coherant  Upper endoscopy and Colonoscopy performed on 2/10 by Dr. Havery Moros. EGD showed non-bleeding duodenal and gastric ulcers. Colonoscopy showed decreased sphincter tone, internal hemorrhoids, 4 mm polyp in proximal transverse colon removed with cold biopsy forceps.    Assessment and Plan  1. Normocytic Anemia- multifactorial etiology, CKD, fluid overload, dehydration, etc. Resolved, hgb 11.7, FOBT negative today 2/11.  2. Nausea- Could be d/t protonix, consider decreasing dose or switching to omeprazole? Will discuss with Dr. Carlean Purl.  I have personally seen the patient, reviewed and repeated key elements of the history and physical and participated in formation of the assessment and plan the student has  documented.  Probably not pantoprazole causing nausea and vomiting Will observe - hopefully will not recur but will not test anything else at this point or change meds.  Gatha Mayer, MD, [facility] [phone] (pager) 11/15/2017 4:42 PM

## 2017-11-15 NOTE — Progress Notes (Signed)
Occupational Therapy Session Note  Patient Details  Name: Laura [name] MRN: [id number] Date of Birth: 04/05/1961  Today's Date: 11/15/2017 OT Individual Time: 1105-1130 OT Individual Time Calculation (min): 25 min   Short Term Goals: Week 1:  OT Short Term Goal 1 (Week 1): Pt will completed UB dressing with minA  OT Short Term Goal 2 (Week 1): Pt will complete LB dressing with modA OT Short Term Goal 3 (Week 1): Pt will perform shower transfer with mod A  OT Short Term Goal 4 (Week 1): Pt will particpiate in OOB activities for 30 mins OT Short Term Goal 5 (Week 1): Pt will complete grooming at sink side with min A     Skilled Therapeutic Interventions/Progress Updates:    Pt greeted reclined in TIS. Somnolent, verbalizing quietly that her stomach hurt and that she felt cold. Placed TIS in neutral and escorted her to sink to engage in light grooming tasks (pt initially in agreement). Once she was provided setup, pt refused to engage in these tasks. Wrapped her in another blanket for increased warmth (as she still c/o feeling cold). Gently educated pt on importance of therapy participation for achieving OT goals. Pt seemed confused. Disoriented to time, place, and situation. Cues required for increasing alertness and keeping eyes open while OT educated her. After conversing with OT, alertness slightly increased, and she then agreed to assist with hairbrushing. Afterwards she was escorted back to bedside. TIS reclined, safety belt fastened, and chair alarm set. She was left with call bell in lap at session exit.   RN made aware of pts mentation during session as well as feelings of nausea  Therapy Documentation Precautions:  Precautions Precautions: Fall Precaution Comments: monitor blood pressure Restrictions Weight Bearing Restrictions: No Vital Signs: Therapy Vitals Pulse Rate: 95 BP: (!) 153/75 Patient Position (if appropriate): Sitting Pain: No c/o pain   ADL: ADL Eating: Not  assessed Grooming: Minimal assistance, Setup Where Assessed-Grooming: Bed level Upper Body Bathing: Minimal assistance Where Assessed-Upper Body Bathing: Bed level Lower Body Bathing: Moderate assistance Where Assessed-Lower Body Bathing: Bed level Upper Body Dressing: Moderate assistance Where Assessed-Upper Body Dressing: Bed level Lower Body Dressing: Maximal assistance Where Assessed-Lower Body Dressing: Bed level Toileting: Maximal assistance Where Assessed-Toileting: Bedside Commode Toilet Transfer: Unable to assess(due to dizziness and BP ) Toilet Transfer Method: Unable to assess Tub/Shower Transfer: Unable to assess Tub/Shower Transfer Method: Unable to assess Social research officer, government: Unable to assess Social research officer, government Method: Unable to assess ADL Comments: unable to completed transfers at time of eval. pt dizzinessn oted upon upright sitting-=bp monitored throughout session in sitting and HOB elevated     See Function Navigator for Current Functional Status.   Therapy/Group: Individual Therapy  Arron Tetrault A Natalya Domzalski 11/15/2017, 12:54 PM

## 2017-11-16 ENCOUNTER — Inpatient Hospital Stay (HOSPITAL_COMMUNITY): Payer: No Typology Code available for payment source | Admitting: Physical Therapy

## 2017-11-16 ENCOUNTER — Inpatient Hospital Stay (HOSPITAL_COMMUNITY): Payer: No Typology Code available for payment source | Admitting: Occupational Therapy

## 2017-11-16 ENCOUNTER — Encounter: Payer: Self-pay | Admitting: Gastroenterology

## 2017-11-16 ENCOUNTER — Inpatient Hospital Stay (HOSPITAL_COMMUNITY): Payer: No Typology Code available for payment source | Admitting: Speech Pathology

## 2017-11-16 ENCOUNTER — Encounter (HOSPITAL_COMMUNITY): Payer: Self-pay | Admitting: Internal Medicine

## 2017-11-16 DIAGNOSIS — K269 Duodenal ulcer, unspecified as acute or chronic, without hemorrhage or perforation: Secondary | ICD-10-CM

## 2017-11-16 DIAGNOSIS — E860 Dehydration: Secondary | ICD-10-CM

## 2017-11-16 DIAGNOSIS — N390 Urinary tract infection, site not specified: Secondary | ICD-10-CM

## 2017-11-16 LAB — RENAL FUNCTION PANEL
ANION GAP: 13 (ref 5–15)
Albumin: 2.3 g/dL — ABNORMAL LOW (ref 3.5–5.0)
BUN: 75 mg/dL — ABNORMAL HIGH (ref 6–20)
CHLORIDE: 110 mmol/L (ref 101–111)
CO2: 20 mmol/L — AB (ref 22–32)
Calcium: 8.8 mg/dL — ABNORMAL LOW (ref 8.9–10.3)
Creatinine, Ser: 2.69 mg/dL — ABNORMAL HIGH (ref 0.44–1.00)
GFR calc non Af Amer: 19 mL/min — ABNORMAL LOW (ref >60)
GFR, EST AFRICAN AMERICAN: 22 mL/min — AB (ref >60)
Glucose, Bld: 128 mg/dL — ABNORMAL HIGH (ref 65–99)
Phosphorus: 5.4 mg/dL — ABNORMAL HIGH (ref 2.5–4.6)
Potassium: 4.1 mmol/L (ref 3.5–5.1)
Sodium: 143 mmol/L (ref 135–145)

## 2017-11-16 LAB — URINE CULTURE: Culture: >=100000 — AB

## 2017-11-16 LAB — H. PYLORI ANTIBODY, IGG: H Pylori IgG: <0.8 Index Value (ref 0.00–0.79)

## 2017-11-16 LAB — GLUCOSE, CAPILLARY
GLUCOSE-CAPILLARY: 193 mg/dL — AB (ref 65–99)
GLUCOSE-CAPILLARY: 125 mg/dL — AB (ref 65–99)
Glucose-Capillary: 126 mg/dL — ABNORMAL HIGH (ref 65–99)
Glucose-Capillary: 155 mg/dL — ABNORMAL HIGH (ref 65–99)

## 2017-11-16 MED ORDER — PANTOPRAZOLE SODIUM 40 MG PO TBEC
40.0000 mg | DELAYED_RELEASE_TABLET | Freq: Two times a day (BID) | ORAL | Status: DC
  Administered 2017-11-16 – 2017-12-02 (×32): 40 mg via ORAL
  Filled 2017-11-16 (×32): qty 1

## 2017-11-16 MED ORDER — SODIUM CHLORIDE 0.45 % IV SOLN
INTRAVENOUS | Status: DC
  Administered 2017-11-16 – 2017-11-22 (×9): via INTRAVENOUS

## 2017-11-16 MED ORDER — GLYCERIN (LAXATIVE) 1.2 G RE SUPP
1.0000 | Freq: Every day | RECTAL | Status: DC
Start: 2017-11-16 — End: 2017-11-23
  Administered 2017-11-16 – 2017-11-22 (×4): 1.2 g via RECTAL
  Filled 2017-11-16 (×7): qty 1

## 2017-11-16 MED ORDER — PANTOPRAZOLE SODIUM 40 MG PO TBEC: 40.0000 mg | DELAYED_RELEASE_TABLET | Freq: Every day | ORAL | Status: DC

## 2017-11-16 NOTE — Progress Notes (Signed)
Subjective/Complaints: Pt seen sitting up in her chair this morning. She states she slept well overnight. She states she is going home tomorrow. When asked who informed her she is going home tomorrow she states the doctor. When asked which Dr., she states the doctor that sees her every morning. When notified her that I was that Dr. she states it was not me, but another doctor that sees her every morning.  Review of systems: Limited due to lethargy  Objective: Vital Signs: Blood pressure (!) 169/96, pulse 96, temperature 98.9 F (37.2 C), temperature source Oral, resp. rate 17, height _0  (1.499 m), weight 58.2 kg (128 lb 4.9 oz), SpO2 96 %. No results found. Results for orders placed or performed during the hospital encounter of 11/05/17 (from the past 72 hour(s))  Glucose, capillary     Status: Abnormal   Collection Time: 11/13/17 12:11 PM  Result Value Ref Range   Glucose-Capillary 122 (H) 65 - 99 mg/dL   Comment 1 Document in Chart   Glucose, capillary     Status: Abnormal   Collection Time: 11/13/17  5:16 PM  Result Value Ref Range   Glucose-Capillary 126 (H) 65 - 99 mg/dL  Glucose, capillary     Status: Abnormal   Collection Time: 11/13/17  8:42 PM  Result Value Ref Range   Glucose-Capillary 134 (H) 65 - 99 mg/dL  Glucose, capillary     Status: None   Collection Time: 11/14/17  6:28 AM  Result Value Ref Range   Glucose-Capillary 82 65 - 99 mg/dL  Renal function panel     Status: Abnormal   Collection Time: 11/14/17  7:55 AM  Result Value Ref Range   Sodium 143 135 - 145 mmol/L   Potassium 4.7 3.5 - 5.1 mmol/L   Chloride 109 101 - 111 mmol/L   CO2 18 (L) 22 - 32 mmol/L   Glucose, Bld 88 65 - 99 mg/dL   BUN 76 (H) 6 - 20 mg/dL   Creatinine, Ser 2.55 (H) 0.44 - 1.00 mg/dL   Calcium 8.8 (L) 8.9 - 10.3 mg/dL   Phosphorus 6.3 (H) 2.5 - 4.6 mg/dL   Albumin 2.4 (L) 3.5 - 5.0 g/dL   GFR calc non Af Amer 20 (L) >60 mL/min   GFR calc Af Amer 23 (L) >60 mL/min    Comment:  (NOTE) The eGFR has been calculated using the CKD EPI equation. This calculation has not been validated in all clinical situations. eGFR's persistently <60 mL/min signify possible Chronic Kidney Disease.    Anion gap 16 (H) 5 - 15    Comment: Performed at [facility], [place] [address], [place], [place] [address]  Glucose, capillary     Status: None   Collection Time: 11/14/17  9:12 AM  Result Value Ref Range   Glucose-Capillary 89 65 - 99 mg/dL   Comment 1 Notify RN   Glucose, capillary     Status: None   Collection Time: 11/14/17 12:12 PM  Result Value Ref Range   Glucose-Capillary 86 65 - 99 mg/dL  Glucose, capillary     Status: None   Collection Time: 11/14/17  4:24 PM  Result Value Ref Range   Glucose-Capillary 85 65 - 99 mg/dL   Comment 1 Notify RN   Glucose, capillary     Status: Abnormal   Collection Time: 11/14/17  9:35 PM  Result Value Ref Range   Glucose-Capillary 113 (H) 65 - 99 mg/dL  Occult blood card to lab,  stool     Status: None   Collection Time: 11/15/17 12:55 AM  Result Value Ref Range   Fecal Occult Bld NEGATIVE NEGATIVE    Comment: Performed at [facility], [address] [place], [place] [address]  Renal function panel     Status: Abnormal   Collection Time: 11/15/17  5:11 AM  Result Value Ref Range   Sodium 142 135 - 145 mmol/L   Potassium 4.4 3.5 - 5.1 mmol/L   Chloride 111 101 - 111 mmol/L   CO2 19 (L) 22 - 32 mmol/L   Glucose, Bld 92 65 - 99 mg/dL   BUN 69 (H) 6 - 20 mg/dL   Creatinine, Ser 2.35 (H) 0.44 - 1.00 mg/dL   Calcium 8.4 (L) 8.9 - 10.3 mg/dL   Phosphorus 5.5 (H) 2.5 - 4.6 mg/dL   Albumin 2.1 (L) 3.5 - 5.0 g/dL   GFR calc non Af Amer 22 (L) >60 mL/min   GFR calc Af Amer 25 (L) >60 mL/min    Comment: (NOTE) The eGFR has been calculated using the CKD EPI equation. This calculation has not been validated in all clinical situations. eGFR's persistently <60 mL/min signify possible Chronic Kidney Disease.    Anion gap 12 5  - 15    Comment: Performed at [facility] [address], [place], [place] [address]  Glucose, capillary     Status: None   Collection Time: 11/15/17  6:31 AM  Result Value Ref Range   Glucose-Capillary 95 65 - 99 mg/dL  Glucose, capillary     Status: Abnormal   Collection Time: 11/15/17 11:43 AM  Result Value Ref Range   Glucose-Capillary 129 (H) 65 - 99 mg/dL  H. pylori antibody, IgG     Status: None   Collection Time: 11/15/17 12:04 PM  Result Value Ref Range   H Pylori IgG <0.80 0.00 - 0.79 Index Value    Comment: (NOTE)                             Negative           <0.80                             Equivocal    0.80 - 0.89                             Positive           >0.89 Performed At: [facility] [place], [place] [id number] Rush Farmer MD ZW:[id number] Performed at [facility], [place] [address], [place], [place] [address]   Glucose, capillary     Status: Abnormal   Collection Time: 11/15/17  4:19 PM  Result Value Ref Range   Glucose-Capillary 129 (H) 65 - 99 mg/dL  Glucose, capillary     Status: Abnormal   Collection Time: 11/15/17  9:23 PM  Result Value Ref Range   Glucose-Capillary 145 (H) 65 - 99 mg/dL  Renal function panel     Status: Abnormal   Collection Time: 11/16/17  5:35 AM  Result Value Ref Range   Sodium 143 135 - 145 mmol/L   Potassium 4.1 3.5 - 5.1 mmol/L   Chloride 110 101 - 111 mmol/L   CO2 20 (L) 22 - 32 mmol/L   Glucose,  Bld 128 (H) 65 - 99 mg/dL   BUN 75 (H) 6 - 20 mg/dL   Creatinine, Ser 2.69 (H) 0.44 - 1.00 mg/dL   Calcium 8.8 (L) 8.9 - 10.3 mg/dL   Phosphorus 5.4 (H) 2.5 - 4.6 mg/dL   Albumin 2.3 (L) 3.5 - 5.0 g/dL   GFR calc non Af Amer 19 (L) >60 mL/min   GFR calc Af Amer 22 (L) >60 mL/min    Comment: (NOTE) The eGFR has been calculated using the CKD EPI equation. This calculation has not been validated in all clinical situations. eGFR's persistently <60 mL/min signify possible Chronic  Kidney Disease.    Anion gap 13 5 - 15    Comment: Performed at [facility] [address] [place], [place] [address]  Glucose, capillary     Status: Abnormal   Collection Time: 11/16/17  6:45 AM  Result Value Ref Range   Glucose-Capillary 126 (H) 65 - 99 mg/dL    General: NAD. Vital signs reviewed. HEENT: Normocephalic, atraumatic.  Cardio: RRR and no JVD. Resp: CTA B/L and Unlabored GI: BS positive and ND Musc/Skel:  No edmea or tenderness. Neuro: Alert Motor:  LUE: 5/5 proximal to distal RUE: 4+/5 proximal to distal (stable) RLE: HF: 2/5, ADF/PF 2/5 (unchanged) LLE: HF 2+/5, ADF/PF 2+/5 (unchanged) Skin: Intact. Warm and dry.    Assessment/Plan: 1. Functional deficits secondary to debility and encephalopathy which require 3+ hours per day of interdisciplinary therapy in a comprehensive inpatient rehab setting. Physiatrist is providing close team supervision and 24 hour management of active medical problems listed below. Physiatrist and rehab team continue to assess barriers to discharge/monitor patient progress toward functional and medical goals. FIM: Function - Bathing Position: Wheelchair/chair at sink Body parts bathed by patient: Right arm, Left arm, Chest, Abdomen Body parts bathed by helper: Front perineal area, Buttocks, Back Bathing not applicable: Left upper leg, Left lower leg, Right lower leg, Right upper leg Assist Level: 2 helpers  Function- Upper Body Dressing/Undressing What is the patient wearing?: Hospital gown Bra - Perfomed by patient: Thread/unthread left bra strap Bra - Perfomed by helper: Thread/unthread right bra strap, Hook/unhook bra (pull down sports bra), Thread/unthread left bra strap Pull over shirt/dress - Perfomed by patient: Thread/unthread right sleeve, Thread/unthread left sleeve, Put head through opening Pull over shirt/dress - Perfomed by helper: Pull shirt over trunk Assist Level: 2 helpers Function - Lower Body  Dressing/Undressing What is the patient wearing?: Hospital Gown Position: Bed Underwear - Performed by helper: Thread/unthread right underwear leg, Thread/unthread left underwear leg Pants- Performed by helper: Thread/unthread right pants leg, Thread/unthread left pants leg, Pull pants up/down Assist for footwear: Maximal assist Assist for lower body dressing: 2 Helpers Set up : To obtain clothing/put away  Function - Toileting Toileting activity did not occur: No continent bowel/bladder event Toileting steps completed by helper: Adjust clothing prior to toileting, Performs perineal hygiene, Adjust clothing after toileting(per [place], NT report) Assist level: Touching or steadying assistance (Pt.75%)(per [place] Flirt [place], NT report)  Function - Air cabin crew transfer activity did not occur: Safety/medical concerns Assist level to toilet: 2 helpers(per [place], NT report)  Function - Chair/bed transfer Chair/bed transfer method: Lateral scoot Chair/bed transfer assist level: Total assist (Pt < 25%) Chair/bed transfer assistive device: Sliding board Chair/bed transfer details: Verbal cues for precautions/safety, Verbal cues for safe use of DME/AE, Manual facilitation for weight shifting, Manual facilitation for placement  Function - Locomotion: Wheelchair Will patient use  wheelchair at discharge?: Yes Type: Manual Max wheelchair distance: 100' Assist Level: Touching or steadying assistance (Pt > 75%) Assist Level: Touching or steadying assistance (Pt > 75%) Wheel 150 feet activity did not occur: Safety/medical concerns Turns around,maneuvers to table,bed, and toilet,negotiates 3% grade,maneuvers on rugs and over doorsills: No Function - Locomotion: Ambulation Ambulation activity did not occur: Safety/medical concerns Walk 10 feet activity did not occur: Safety/medical concerns Walk 50 feet with 2 turns activity did not occur: Safety/medical concerns Walk  150 feet activity did not occur: Safety/medical concerns Walk 10 feet on uneven surfaces activity did not occur: Safety/medical concerns  Function - Comprehension Comprehension: Auditory Comprehension assist level: Understands basic 90% of the time/cues < 10% of the time  Function - Expression Expression: Verbal Expression assist level: Expresses basic 75 - 89% of the time/requires cueing 10 - 24% of the time. Needs helper to occlude trach/needs to repeat words.  Function - Social Interaction Social Interaction assist level: Interacts appropriately 50 - 74% of the time - May be physically or verbally inappropriate.  Function - Problem Solving Problem solving assist level: Solves basic 50 - 74% of the time/requires cueing 25 - 49% of the time  Function - Memory Memory assist level: Recognizes or recalls 25 - 49% of the time/requires cueing 50 - 75% of the time Patient normally able to recall (first 3 days only): That he or she is in a hospital  Medical Problem List and Plan: 1.Functional and mobility deficitssecondary to debility and encephalopathy from multiple medical issues   Cont CIR   Appreciated neurology recs, repeat MRI brain and T-spine suggesting stable/improvement. Neurology signed off. 2. DVT Prophylaxis/Anticoagulation: Pharmaceutical:Lovenox 3. Pain Management:tylenol prn 4. Mood:LCSW to follow for evaluation and support. 5. Neuropsych: This patientis not fullycapable of making decisions onherown behalf. 6. Skin/Wound Care:Air mattress for pressure relief measures. Local measures to help managesacral /labialMASD PRAFO's for bilateral LE's 7. Fluids/Electrolytes/Nutrition:Monitor I/O.    Continue to offer supplements to help with protein calorie malnutrition.   Cont Remeron 15 8. C diff colitis:    Vancomycincompleted 2/6 Continue enteric precautions Stools are improving 9. HTN: Monitor BP bid.    Nifedipine bid  increased to 60 on 2/6   Imdur increased to 90 on 2/4   Metoprolol increased to 100 BID on 2/3   Labile, will consider adding medication tomorrow if persistently elevated Vitals:   11/15/17 1517 11/16/17 0019  BP: (!) 162/81 (!) 169/96  Pulse: 82 96  Resp: 16 17  Temp: 98.3 F (36.8 C) 98.9 F (37.2 C)  SpO2: 94% 96%  10 T2DM with retinopathy, neuropathy, andnephropathy:Monitor BS ac/hs.    SSI for elevated BS CBG (last 3)  Recent Labs    11/15/17 1619 11/15/17 2123 11/16/17 0645  GLUCAP 129* 145* 126*    Relatively controlled on 2/12 11. H/o depression: Continue Effexor and Remeron.  12. Neurogenic bladder:Has chronic indwelling catheter for 2+ months.   D/ced foley and attempt to manage with I/O caths. Discussed with nursing, strict I/Os.  13.AKI onCKD with history of overload:    Cr 2.69 on 2/12   Encourage fluids   Appreciate nephrology recs, following along, sodium bicarb.    Cont to monitor 14. Diffuse CAD: treated medically with Metoprolol, Zetia and Plavix. 15.  ABLA on anemia of chronic disease   Stool OB now neg   Hb 11.7 after transfusion of 2 units on 2/8   Labs ordered for tomorrow   Appreciate GI recs, EGD/colonoscopy performed on  2/10 showing a total of 5 duodenal ulcers, which are presumed to be a source of bleeding.  16. Leukocytosis: Resolved 17. Hypoalbuminemia   Cont supplement 18. Hyperkalemia   K+ 4.1 on 2/12   Labs ordered for tomorrow   Cont to monitor 19. Abnormal urine analysis   UA is equivocal, urine culture with Pseudomonas   Cipro started 2/11-2/13 20.  GI bleed   Believed to be secondary to duodenal ulcers   Appreciate GI recs, recommend 40 mg IV Protonix twice daily until hemoglobin stable, then p.o.   H. pylori serology negative   Polyp biopsy on colonoscopy pending  LOS (Days) 11 A FACE TO FACE EVALUATION WAS PERFORMED  Sharisa Toves Lorie Phenix 11/16/2017, 10:11 AM

## 2017-11-16 NOTE — Progress Notes (Signed)
Occupational Therapy Session Note  Patient Details  Name: Laura [name] MRN: [id number] Date of Birth: 1961/05/17  Today's Date: 11/16/2017 OT Individual Time: 6010-9323 OT Individual Time Calculation (min): 28 min    Short Term Goals: Week 2:  OT Short Term Goal 1 (Week 2): Pt will complete LB dressing with modA OT Short Term Goal 2 (Week 2): Pt will perform shower transfer with mod A  OT Short Term Goal 3 (Week 2): Pt with complete sit <> stand with max assist of one caregiver OT Short Term Goal 4 (Week 2): Pt will complete grooming at sink side with min A   Skilled Therapeutic Interventions/Progress Updates:    Upon entering the room, pt seated in wheelchair with increased fatigue from prior therapy sessions. RN and NT present and needing assistance to transfer pt to bed to I and O cath. Pt transferred from wheelchair >bed total A squat pivot transfer. Sit >supine with total A as well. Pt rolling L <> R with use of grab bar and min A for clothing management and hygiene. Pt remained supine in bed at end of session with call bell and all needed items within reach.   Therapy Documentation Precautions:  Precautions Precautions: Fall Precaution Comments: monitor blood pressure Restrictions Weight Bearing Restrictions: (P) No  Pain: Pain Assessment Pain Assessment: No/denies pain ADL: ADL Eating: Not assessed Grooming: Minimal assistance, Setup Where Assessed-Grooming: Bed level Upper Body Bathing: Minimal assistance Where Assessed-Upper Body Bathing: Bed level Lower Body Bathing: Moderate assistance Where Assessed-Lower Body Bathing: Bed level Upper Body Dressing: Moderate assistance Where Assessed-Upper Body Dressing: Bed level Lower Body Dressing: Maximal assistance Where Assessed-Lower Body Dressing: Bed level Toileting: Maximal assistance Where Assessed-Toileting: Bedside Commode Toilet Transfer: Unable to assess(due to dizziness and BP ) Toilet Transfer Method:  Unable to assess Tub/Shower Transfer: Unable to assess Tub/Shower Transfer Method: Unable to assess Social research officer, government: Unable to assess Social research officer, government Method: Unable to assess ADL Comments: unable to completed transfers at time of eval. pt dizzinessn oted upon upright sitting-=bp monitored throughout session in sitting and HOB elevated   See Function Navigator for Current Functional Status.   Therapy/Group: Individual Therapy  Gypsy Decant 11/16/2017, 9:30 AM

## 2017-11-16 NOTE — Progress Notes (Signed)
S:No new complaints but nurses report poor po intake O:BP (!) 169/96 (BP Location: Left Arm)   Pulse 96   Temp 98.9 F (37.2 C) (Oral)   Resp 17   Ht 4\' 11"  (1.499 m)   Wt 58.2 kg (128 lb 4.9 oz)   SpO2 96%   BMI 25.91 kg/m   Intake/Output Summary (Last 24 hours) at 11/16/2017 1158 Last data filed at 11/16/2017 1150 Gross per 24 hour  Intake 840 ml  Output 625 ml  Net 215 ml   Intake/Output: I/O last 3 completed shifts: In: 36 [P.O.:580] Out: 1300 [Urine:1300]  Intake/Output this shift:  Total I/O In: 480 [P.O.:480] Out: 200 [Urine:200] Weight change: -0.314 kg (-11.1 oz) Gen: NAD CVS: no rub Resp: cta Abd: benign Ext: 1+ pretibial edema  Recent Labs  Lab 11/10/17 0647 11/12/17 0515 11/13/17 0501 11/14/17 0755 11/15/17 0511 11/16/17 0535  NA 141 140 141 143 142 143  K 5.1 5.0 5.1 4.7 4.4 4.1  CL 110 110 110 109 111 110  CO2 19* 20* 16* 18* 19* 20*  GLUCOSE 122* 96 115* 88 92 128*  BUN 80* 82* 81* 76* 69* 75*  CREATININE 2.39* 2.58* 2.64* 2.55* 2.35* 2.69*  ALBUMIN  --   --  2.5* 2.4* 2.1* 2.3*  CALCIUM 8.1* 8.2* 8.8* 8.8* 8.4* 8.8*  PHOS  --   --  7.3* 6.3* 5.5* 5.4*   Liver Function Tests: Recent Labs  Lab 11/14/17 0755 11/15/17 0511 11/16/17 0535  ALBUMIN 2.4* 2.1* 2.3*   No results for input(s): LIPASE, AMYLASE in the last 168 hours. No results for input(s): AMMONIA in the last 168 hours. CBC: Recent Labs  Lab 11/10/17 0647 11/12/17 0515 11/13/17 0501  WBC 10.2 7.9 8.6  NEUTROABS 6.2 4.0  --   HGB 7.7* 6.9* 11.7*  HCT 23.8* 21.9* 35.9*  MCV 86.9 87.3 87.6  PLT 472* 475* 488*   Cardiac Enzymes: No results for input(s): CKTOTAL, CKMB, CKMBINDEX, TROPONINI in the last 168 hours. CBG: Recent Labs  Lab 11/15/17 1143 11/15/17 1619 11/15/17 2123 11/16/17 0645 11/16/17 1132  GLUCAP 129* 129* 145* 126* 193*    Iron Studies: No results for input(s): IRON, TIBC, TRANSFERRIN, FERRITIN in the last 72 hours. Studies/Results: No results  found. . ciprofloxacin  250 mg Oral BID  . collagenase   Topical Daily  . enoxaparin (LOVENOX) injection  30 mg Subcutaneous Q24H  . ezetimibe  10 mg Oral Daily  . feeding supplement (NEPRO CARB STEADY)  237 mL Oral TID WC  . feeding supplement (PRO-STAT SUGAR FREE 64)  30 mL Oral BID  . glycerin (Pediatric)  1 suppository Rectal QPC supper  . insulin aspart  0-9 Units Subcutaneous TID WC  . isosorbide mononitrate  90 mg Oral Daily  . metoprolol tartrate  100 mg Oral BID  . mirtazapine  15 mg Oral QHS  . multivitamin  1 tablet Oral QHS  . NIFEdipine  60 mg Oral BID  . pantoprazole (PROTONIX) IV  40 mg Intravenous Q12H  . patiromer  8.4 g Oral Daily  . sodium bicarbonate  650 mg Oral BID  . venlafaxine XR  150 mg Oral Q breakfast    BMET    Component Value Date/Time   NA 143 11/16/2017 0535   K 4.1 11/16/2017 0535   CL 110 11/16/2017 0535   CO2 20 (L) 11/16/2017 0535   GLUCOSE 128 (H) 11/16/2017 0535   BUN 75 (H) 11/16/2017 0535   CREATININE 2.69 (H) 11/16/2017  0535   CALCIUM 8.8 (L) 11/16/2017 0535   GFRNONAA 19 (L) 11/16/2017 0535   GFRAA 22 (L) 11/16/2017 0535   CBC    Component Value Date/Time   WBC 8.6 11/13/2017 0501   RBC 4.10 11/13/2017 0501   HGB 11.7 (L) 11/13/2017 0501   HCT 35.9 (L) 11/13/2017 0501   PLT 488 (H) 11/13/2017 0501   MCV 87.6 11/13/2017 0501   MCH 28.5 11/13/2017 0501   MCHC 32.6 11/13/2017 0501   RDW 15.3 11/13/2017 0501   LYMPHSABS 3.1 11/12/2017 0515   MONOABS 0.5 11/12/2017 0515   EOSABS 0.4 11/12/2017 0515   BASOSABS 0.1 11/12/2017 0515    Assessment/Plan:  1. AKI/CKD stage 4- related to poor po intake and UGI bleed from duodenal ulcers.  Renal function was improving but poor po intake and now with bump in BUN/Cr.  Will add IVF's as she has not had much po intake over the last 24 hours.  Will add IVF's and follow 2. ABLA due to GI Bleed and anemia of CKD stage 4- s/p transfusion 3. Metabolic acidosis due to #1- improving with po  bicarb. 4. Fever- s/p completion of po vancomycin for recent C Diff 5. HTN- stable 6. Severe deconditioning/debilitation- cont with PT/OT with inpatient rehab.    Donetta Potts, MD [employer] [phone]

## 2017-11-16 NOTE — Progress Notes (Signed)
Physical Therapy Note  Patient Details  Name: Laura [name] MRN: [id number] Date of Birth: 08-07-1961 Today's Date: 11/16/2017    Time: 700-754 54 minutes  1:1 No c/o pain.  Pt awake upon PT arrival and agreeable to treatment.  Pt max A with bed mobility and squat pivot transfer to w/c. Standing frame 2 x 8 minutes with manual facilitation for upright trunk, midline stance and multiple rest breaks resting on UEs due to trunk fatigue.  Pt performed standing reaching and ball tap tasks.  Pt requires prolonged rest break between sets due to fatigue and feeling "dizzy".  BP 123/61 in standing.  Pt left in room with chair  Alarm and quick release belt, set up for breakfast.   Jewell Ryans 11/16/2017, 9:01 AM

## 2017-11-16 NOTE — Consult Note (Addendum)
[facility] Nurse wound follow-up consult note Reason for Consult: Consult requested to re-assess sacrum wound.  Refer to previous [facility] consult performed on 1/27. Pt very thin with protruding sacrum bone.  Wound type: Darker brown, discolored skin surrounding sacrum/buttocks has resolved. Sacrum with unstageable pressure injury has slightly decreased in size; 1.2.X.8cm, 90% yellow slough, 10% red, small amt tan drainage, no odor Pressure Injury POA: Yes Dressing procedure/placement/frequency: Continue present plan of care with Santyl ointment to provide enzymatic debridement of nonviable tissue.  Pt is already on an air mattress to reduce pressure.  Tegaderm to cover the dressing and attempt to keep the stool out of the wound.  Discussed plan of care with patient and she verbalized understanding. Please re-consult if further assistance is needed.  Thank-you,  Julien Girt MSN, [place], [place], [place], [place]

## 2017-11-16 NOTE — Progress Notes (Signed)
Speech Language Pathology Daily Session Note  Patient Details  Name: Pansie Guggisberg MRN: [id number] Date of Birth: 1961/05/31  Today's Date: 11/16/2017 SLP Individual Time: 0802-0900 SLP Individual Time Calculation (min): 58 min  Short Term Goals: Week 2: SLP Short Term Goal 1 (Week 2): Pt will utilize external memory aides to recall new dialy information with Mod A cues.  SLP Short Term Goal 2 (Week 2): Pt will complete basic familiar problem solving task related to ADL with Min A cues.  SLP Short Term Goal 3 (Week 2): Pt will sustain attention to basic familiar task for ~ 10 minutes with Min A cues.  SLP Short Term Goal 4 (Week 2): Pt will increase vocal intensity to achieve intelligibility in mildly noisy environments with mod verbal cues.    Skilled Therapeutic Interventions:   Pt was seen for skilled ST targeting cognitive goals.  Pt sitting upright in wheelchair upon therapist's arrival.  Subjectively, she seems clearer in comparison to previous therapy sessions but was perseverative on the idea that her husband was going to leave her.  Spoke with RN and PA, both of whom endorse that husband is very supportive and involved in her care.  SLP provided reassurance to pt who was easily redirected.  SLP facilitated the session with a novel card game targeting problem solving and attention goals.  Pt completed task with mod assist for attention to detail, planning, and working memory of task rules and procedures.  Pt was returned to room and left in wheelchair with quick release belt donned and call bell within reach.  Continue per current plan of care.     Function:  Eating Eating                 Cognition Comprehension Comprehension assist level: Understands basic 90% of the time/cues < 10% of the time  Expression   Expression assist level: Expresses basic 75 - 89% of the time/requires cueing 10 - 24% of the time. Needs helper to occlude trach/needs to repeat words.  Social  Interaction Social Interaction assist level: Interacts appropriately 50 - 74% of the time - May be physically or verbally inappropriate.  Problem Solving Problem solving assist level: Solves basic 50 - 74% of the time/requires cueing 25 - 49% of the time  Memory Memory assist level: Recognizes or recalls 25 - 49% of the time/requires cueing 50 - 75% of the time    Pain Pain Assessment Pain Assessment: No/denies pain  Therapy/Group: Individual Therapy  Kearsten Ginther, Selinda Orion 11/16/2017, 9:02 AM

## 2017-11-16 NOTE — Progress Notes (Signed)
Occupational Therapy Session Note  Patient Details  Name: Laura [name] MRN: [id number] Date of Birth: 04-18-1961  Today's Date: 11/16/2017 OT Individual Time: 1306-1400 OT Individual Time Calculation (min): 54 min    Short Term Goals: Week 2:  OT Short Term Goal 1 (Week 2): Pt will complete LB dressing with modA OT Short Term Goal 2 (Week 2): Pt will perform shower transfer with mod A  OT Short Term Goal 3 (Week 2): Pt with complete sit <> stand with max assist of one caregiver OT Short Term Goal 4 (Week 2): Pt will complete grooming at sink side with min A   Skilled Therapeutic Interventions/Progress Updates:    Treatment session with focus on bed mobility, sitting balance, orientation, and overall activity tolerance.  Pt received supine in bed reporting feeling better today, but tired.  Willing to engage in therapeutic activity seated EOB.  Utilized pictures friend had provided in room to engage in discussion regarding family support system, as pt had previously mentioned to this therapist as well as SLP about family leaving her.  Reoriented and reassured pt of support from family and friends.  Engaged in trunk control with focus on reciprocal scooting forward/backward and side to side on EOB, pt requiring min-mod assist for weight shifting and scooting.  Pt reports "lightheaded" in sitting, assessed BP (WNL) and returned to supine in bed per pt request.  Engaged in bed mobility with rolling and sidelying to sitting on Rt and Lt side of bed with assist to advance BLE on and off bed.  Engaged in bridging and scooting in bed to return to upright in bed.  Therapy Documentation Precautions:  Precautions Precautions: Fall Precaution Comments: monitor blood pressure Restrictions Weight Bearing Restrictions: No General:   Vital Signs: Therapy Vitals BP: (!) 156/83 Patient Position (if appropriate): Sitting Pain:  Pt with no c/o pain  See Function Navigator for Current Functional  Status.   Therapy/Group: Individual Therapy  [name] 11/16/2017, 3:40 PM

## 2017-11-16 NOTE — Progress Notes (Addendum)
   Patient Name: Laura [name] Date of Encounter: 11/16/2017, 10:29 AM    Subjective  Pt is doing well today, she reports no nausea, vomiting, diarrhea, abd pain or other complaints. She is not having any SOB or CP. She is pleasant and affect is alert and appropriate.    Objective  BP (!) 169/96 (BP Location: Left Arm)   Pulse 96   Temp 98.9 F (37.2 C) (Oral)   Resp 17   Ht 4\' 11"  (1.499 m)   Wt 58.2 kg (128 lb 4.9 oz)   SpO2 96%   BMI 25.91 kg/m   General:  Lungs: CTAB CV: RRR, no mgr GI: soft, non-tender, BS +, no TTP Neuro/psych- AOx3  2/11- H. Pylori AB, IgG negative   2/10- EGD showed non-bleeding gastric and duodenal ulcers. Colonoscopy showed decreased sphincter tone, internal hemorrhoids, 81mm polyp of transverse colon removed w/cold bx forceps.    Assessment and Plan   1. Normocytic anemia- Resolved. Multifactorial- CKD, fluid overload, dehydration etc. FOBT negative 2/11. Hgb 2/11 11.7, CBC today pending.   2. Nausea- resolved. Continue protonix 40 mg qd   Pt is doing well overall, not having any GI problems currently, can sign off, will discuss with Dr. Carlean Purl.     [facility] Attending   I have personally seen the patient, reviewed and repeated key elements of the history and physical and participated in formation of the assessment and plan the student has documented.  She is better.  I changed PPI to oral bid  Can resume ASA 81 mg now and would wait to restart clopidogrel x 1 week morw  Signing off  Stay on PPi forever  Does not need a GI clinic appointment for this if doing well  Gatha Mayer, MD, [facility] [phone] (pager) 11/16/2017 4:16 PM

## 2017-11-17 ENCOUNTER — Inpatient Hospital Stay (HOSPITAL_COMMUNITY): Payer: No Typology Code available for payment source | Admitting: Occupational Therapy

## 2017-11-17 ENCOUNTER — Inpatient Hospital Stay (HOSPITAL_COMMUNITY): Payer: No Typology Code available for payment source | Admitting: Speech Pathology

## 2017-11-17 ENCOUNTER — Inpatient Hospital Stay (HOSPITAL_COMMUNITY): Payer: No Typology Code available for payment source | Admitting: Physical Therapy

## 2017-11-17 DIAGNOSIS — I169 Hypertensive crisis, unspecified: Secondary | ICD-10-CM | POA: Insufficient documentation

## 2017-11-17 LAB — CBC WITH DIFFERENTIAL/PLATELET
Basophils Absolute: 0 10*3/uL (ref 0.0–0.1)
Basophils Relative: 0 %
Eosinophils Absolute: 0.3 10*3/uL (ref 0.0–0.7)
Eosinophils Relative: 3 %
HEMATOCRIT: 36.5 % (ref 36.0–46.0)
Hemoglobin: 12 g/dL (ref 12.0–15.0)
LYMPHS PCT: 31 %
Lymphs Abs: 3.4 10*3/uL (ref 0.7–4.0)
MCH: 28.4 pg (ref 26.0–34.0)
MCHC: 32.9 g/dL (ref 30.0–36.0)
MCV: 86.3 fL (ref 78.0–100.0)
MONO ABS: 0.5 10*3/uL (ref 0.1–1.0)
Monocytes Relative: 4 %
NEUTROS ABS: 6.7 10*3/uL (ref 1.7–7.7)
Neutrophils Relative %: 62 %
Platelets: 467 10*3/uL — ABNORMAL HIGH (ref 150–400)
RBC: 4.23 MIL/uL (ref 3.87–5.11)
RDW: 15.8 % — ABNORMAL HIGH (ref 11.5–15.5)
WBC: 10.9 10*3/uL — AB (ref 4.0–10.5)

## 2017-11-17 LAB — BASIC METABOLIC PANEL
ANION GAP: 12 (ref 5–15)
BUN: 80 mg/dL — ABNORMAL HIGH (ref 6–20)
CHLORIDE: 110 mmol/L (ref 101–111)
CO2: 20 mmol/L — ABNORMAL LOW (ref 22–32)
Calcium: 8.8 mg/dL — ABNORMAL LOW (ref 8.9–10.3)
Creatinine, Ser: 2.6 mg/dL — ABNORMAL HIGH (ref 0.44–1.00)
GFR calc Af Amer: 23 mL/min — ABNORMAL LOW (ref >60)
GFR, EST NON AFRICAN AMERICAN: 19 mL/min — AB (ref >60)
GLUCOSE: 126 mg/dL — AB (ref 65–99)
POTASSIUM: 4.3 mmol/L (ref 3.5–5.1)
Sodium: 142 mmol/L (ref 135–145)

## 2017-11-17 LAB — GLUCOSE, CAPILLARY
GLUCOSE-CAPILLARY: 135 mg/dL — AB (ref 65–99)
Glucose-Capillary: 109 mg/dL — ABNORMAL HIGH (ref 65–99)
Glucose-Capillary: 185 mg/dL — ABNORMAL HIGH (ref 65–99)
Glucose-Capillary: 119 mg/dL — ABNORMAL HIGH (ref 65–99)

## 2017-11-17 MED ORDER — HYDRALAZINE HCL 10 MG PO TABS
10.0000 mg | ORAL_TABLET | Freq: Three times a day (TID) | ORAL | Status: DC
  Administered 2017-11-17 – 2017-11-18 (×4): 10 mg via ORAL
  Filled 2017-11-17 (×4): qty 1

## 2017-11-17 NOTE — Progress Notes (Signed)
S:More awake and conversant today.  No complaints O:BP (!) 171/96   Pulse 91   Temp 97.8 F (36.6 C) (Oral)   Resp 18   Ht 4\' 11"  (1.499 m)   Wt 58.3 kg (128 lb 6.7 oz)   SpO2 93%   BMI 25.94 kg/m   Intake/Output Summary (Last 24 hours) at 11/17/2017 1239 Last data filed at 11/17/2017 3716 Gross per 24 hour  Intake 1160 ml  Output 450 ml  Net 710 ml   Intake/Output: I/O last 3 completed shifts: In: 1640 [P.O.:940; I.V.:600; NG/GT:100] Out: 925 [Urine:925]  Intake/Output this shift:  No intake/output data recorded. Weight change: 0.05 kg (1.8 oz) Gen: NAD CVS: no rub Resp: cta Abd: benign Ext: 1+ pretibial edema  Recent Labs  Lab 11/12/17 0515 11/13/17 0501 11/14/17 0755 11/15/17 0511 11/16/17 0535 11/17/17 0458  NA 140 141 143 142 143 142  K 5.0 5.1 4.7 4.4 4.1 4.3  CL 110 110 109 111 110 110  CO2 20* 16* 18* 19* 20* 20*  GLUCOSE 96 115* 88 92 128* 126*  BUN 82* 81* 76* 69* 75* 80*  CREATININE 2.58* 2.64* 2.55* 2.35* 2.69* 2.60*  ALBUMIN  --  2.5* 2.4* 2.1* 2.3*  --   CALCIUM 8.2* 8.8* 8.8* 8.4* 8.8* 8.8*  PHOS  --  7.3* 6.3* 5.5* 5.4*  --    Liver Function Tests: Recent Labs  Lab 11/14/17 0755 11/15/17 0511 11/16/17 0535  ALBUMIN 2.4* 2.1* 2.3*   No results for input(s): LIPASE, AMYLASE in the last 168 hours. No results for input(s): AMMONIA in the last 168 hours. CBC: Recent Labs  Lab 11/12/17 0515 11/13/17 0501 11/17/17 0458  WBC 7.9 8.6 10.9*  NEUTROABS 4.0  --  6.7  HGB 6.9* 11.7* 12.0  HCT 21.9* 35.9* 36.5  MCV 87.3 87.6 86.3  PLT 475* 488* 467*   Cardiac Enzymes: No results for input(s): CKTOTAL, CKMB, CKMBINDEX, TROPONINI in the last 168 hours. CBG: Recent Labs  Lab 11/16/17 1132 11/16/17 1649 11/16/17 2105 11/17/17 0656 11/17/17 1203  GLUCAP 193* 125* 155* 109* 185*    Iron Studies: No results for input(s): IRON, TIBC, TRANSFERRIN, FERRITIN in the last 72 hours. Studies/Results: No results found. . ciprofloxacin  250 mg  Oral BID  . collagenase   Topical Daily  . enoxaparin (LOVENOX) injection  30 mg Subcutaneous Q24H  . ezetimibe  10 mg Oral Daily  . feeding supplement (NEPRO CARB STEADY)  237 mL Oral TID WC  . feeding supplement (PRO-STAT SUGAR FREE 64)  30 mL Oral BID  . glycerin (Pediatric)  1 suppository Rectal QPC supper  . hydrALAZINE  10 mg Oral Q8H  . insulin aspart  0-9 Units Subcutaneous TID WC  . isosorbide mononitrate  90 mg Oral Daily  . metoprolol tartrate  100 mg Oral BID  . mirtazapine  15 mg Oral QHS  . multivitamin  1 tablet Oral QHS  . NIFEdipine  60 mg Oral BID  . pantoprazole  40 mg Oral BID AC  . patiromer  8.4 g Oral Daily  . sodium bicarbonate  650 mg Oral BID  . venlafaxine XR  150 mg Oral Q breakfast    BMET    Component Value Date/Time   NA 142 11/17/2017 0458   K 4.3 11/17/2017 0458   CL 110 11/17/2017 0458   CO2 20 (L) 11/17/2017 0458   GLUCOSE 126 (H) 11/17/2017 0458   BUN 80 (H) 11/17/2017 0458   CREATININE 2.60 (H) 11/17/2017  0458   CALCIUM 8.8 (L) 11/17/2017 0458   GFRNONAA 19 (L) 11/17/2017 0458   GFRAA 23 (L) 11/17/2017 0458   CBC    Component Value Date/Time   WBC 10.9 (H) 11/17/2017 0458   RBC 4.23 11/17/2017 0458   HGB 12.0 11/17/2017 0458   HCT 36.5 11/17/2017 0458   PLT 467 (H) 11/17/2017 0458   MCV 86.3 11/17/2017 0458   MCH 28.4 11/17/2017 0458   MCHC 32.9 11/17/2017 0458   RDW 15.8 (H) 11/17/2017 0458   LYMPHSABS 3.4 11/17/2017 0458   MONOABS 0.5 11/17/2017 0458   EOSABS 0.3 11/17/2017 0458   BASOSABS 0.0 11/17/2017 0458    Assessment/Plan:  1. AKI/CKD stage 4- related to poor po intake and UGI bleed from duodenal ulcers. Renal function was improving but poor po intake and now with bump in BUN/Cr which have improved with IVF's.  Continue to encourage po intake.   2. ABLA due to GI Bleed and anemia of CKD stage 4- s/p transfusion 3. Metabolic acidosis due to #1- improving with po bicarb. 4. Fever- s/p completion of po vancomycin for  recent C Diff 5. HTN- stable 6. Severe deconditioning/debilitation- cont with PT/OT with inpatient rehab.    Donetta Potts, MD [employer] [phone]

## 2017-11-17 NOTE — Progress Notes (Signed)
Speech Language Pathology Daily Session Note  Patient Details  Name: Laura [name] MRN: [id number] Date of Birth: 06/06/1961  Today's Date: 11/17/2017 SLP Individual Time: 0930-1030 SLP Individual Time Calculation (min): 60 min  Short Term Goals: Week 2: SLP Short Term Goal 1 (Week 2): Pt will utilize external memory aides to recall new dialy information with Mod A cues.  SLP Short Term Goal 2 (Week 2): Pt will complete basic familiar problem solving task related to ADL with Min A cues.  SLP Short Term Goal 3 (Week 2): Pt will sustain attention to basic familiar task for ~ 10 minutes with Min A cues.  SLP Short Term Goal 4 (Week 2): Pt will increase vocal intensity to achieve intelligibility in mildly noisy environments with mod verbal cues.    Skilled Therapeutic Interventions:   Pt was seen for skilled ST targeting cognitive goals.  Pt bright and alert, sitting up in wheelchair upon therapist's arrival but with some intermittent language of confusion/confabulation regarding personal history (ie when pointing to a picture that was clearly from a recent wedding, pt stated that it was a picture of a baptism and that it was from before she had children, pt's children are in their late teens, early 32s).  SLP facilitated the session with money management tasks to measure progress from initial attempt.  Pt completed task with mod assist for working memory and task organization which is a marked improvement in comparison to initial attempt.  Pt was able to sustain her attention to task for longer periods of time which appeared to contribute to improved performance.  Pt was returned to room and left in wheelchair with quick release belt donned and with call bell within reach.  Continue per current plan of care.     Function:  Eating Eating                 Cognition Comprehension Comprehension assist level: Follows basic conversation/direction with extra time/assistive device  Expression    Expression assist level: Expresses basic 90% of the time/requires cueing < 10% of the time.  Social Interaction Social Interaction assist level: Interacts appropriately 75 - 89% of the time - Needs redirection for appropriate language or to initiate interaction.  Problem Solving Problem solving assist level: Solves basic 50 - 74% of the time/requires cueing 25 - 49% of the time  Memory Memory assist level: Recognizes or recalls 25 - 49% of the time/requires cueing 50 - 75% of the time    Pain Pain Assessment Pain Assessment: No/denies pain  Therapy/Group: Individual Therapy  Avelina Mcclurkin, Selinda Orion 11/17/2017, 10:59 AM

## 2017-11-17 NOTE — Progress Notes (Signed)
Occupational Therapy Session Note  Patient Details  Name: Laura [name] MRN: [id number] Date of Birth: [birth year]  Today's Date: 11/17/2017 OT Individual Time: 1100-1157 and 6599-3570 OT Individual Time Calculation (min): 57 min and 28 min   Short Term Goals: Week 2:  OT Short Term Goal 1 (Week 2): Pt will complete LB dressing with modA OT Short Term Goal 2 (Week 2): Pt will perform shower transfer with mod A  OT Short Term Goal 3 (Week 2): Pt with complete sit <> stand with max assist of one caregiver OT Short Term Goal 4 (Week 2): Pt will complete grooming at sink side with min A   Skilled Therapeutic Interventions/Progress Updates:    1) Treatment session with focus on trunk control in sitting and standing as well as increased BLE and BUE strengthening.  Pt received upright in regular w/c already dressed.  Engaged in grooming task of washing face in standing at sink.  Required +2 for sit <> stand and standing balance due to BLE weakness, with assist for blocking at knees to increase stability.  Completed 2 bouts of standing with continued need for +2 and blocking at knees.  Engaged in [place] and trunk control in sitting with ball toss, pt demonstrating improved dynamic sitting balance during activity.  Increased challenge to use of 1# dowel rod to tap ball back to therapist increasing challenge on trunk control and BUE strengthening.  Completed 3 sets of 10 chest presses and overhead presses with 1# dowel rod with mod cues for technique.  Returned to room and left upright in w/c with quick release belt fastened and all needs in reach.  2) Treatment session with focus on functional mobility and decreased burden of care with mobility.  Pt received supine in bed with husband at bedside.  Pt's husband minimally involved in session however would attempt to answer questions for pt when question directed towards her.  Engaged in bed mobility with focus on active movement in BLE.  Utilized  slide board to complete transfer from bed > w/c, requiring max cues and max assist due to pt tendency to lean backwards.  Engaged in sit <> partial stand x3 with therapist seated in arm chair placed in front of pt to facilitate anterior weight shift while therapist blocked BLE.  Pt only able to lift buttocks from chair, unable to complete full stand due to BLE weakness.  Returned to bed, per pt request, via slide board.  Again focus on anterior weight shift and lifting of buttocks to maintain integrity of bottom.  Therapy Documentation Precautions:  Precautions Precautions: Fall Precaution Comments: monitor blood pressure Restrictions Weight Bearing Restrictions: No General:   Vital Signs: Therapy Vitals Pulse Rate: 91 BP: (!) 171/96 Pain: Pain Assessment Pain Assessment: No/denies pain  See Function Navigator for Current Functional Status.   Therapy/Group: Individual Therapy  [name] 11/17/2017, 12:42 PM

## 2017-11-17 NOTE — Progress Notes (Signed)
Subjective/Complaints: Patient seen sitting up in bed this morning. She states she slept well overnight. She notes she feels a little bit stronger. Discussed with metastatic regarding patient's emotional status.  Review of systems: Denies CP, SOB, nausea, vomiting, diarrhea.  Objective: Vital Signs: Blood pressure (!) 171/97, pulse 87, temperature 97.8 F (36.6 C), temperature source Oral, resp. rate 18, height _0  (1.499 m), weight 58.3 kg (128 lb 6.7 oz), SpO2 93 %. No results found. Results for orders placed or performed during the hospital encounter of 11/05/17 (from the past 72 hour(s))  Glucose, capillary     Status: None   Collection Time: 11/14/17  9:12 AM  Result Value Ref Range   Glucose-Capillary 89 65 - 99 mg/dL   Comment 1 Notify RN   Glucose, capillary     Status: None   Collection Time: 11/14/17 12:12 PM  Result Value Ref Range   Glucose-Capillary 86 65 - 99 mg/dL  Glucose, capillary     Status: None   Collection Time: 11/14/17  4:24 PM  Result Value Ref Range   Glucose-Capillary 85 65 - 99 mg/dL   Comment 1 Notify RN   Glucose, capillary     Status: Abnormal   Collection Time: 11/14/17  9:35 PM  Result Value Ref Range   Glucose-Capillary 113 (H) 65 - 99 mg/dL  Occult blood card to lab, stool     Status: None   Collection Time: 11/15/17 12:55 AM  Result Value Ref Range   Fecal Occult Bld NEGATIVE NEGATIVE    Comment: Performed at [facility], [address], [place], [place] [address]  Renal function panel     Status: Abnormal   Collection Time: 11/15/17  5:11 AM  Result Value Ref Range   Sodium 142 135 - 145 mmol/L   Potassium 4.4 3.5 - 5.1 mmol/L   Chloride 111 101 - 111 mmol/L   CO2 19 (L) 22 - 32 mmol/L   Glucose, Bld 92 65 - 99 mg/dL   BUN 69 (H) 6 - 20 mg/dL   Creatinine, Ser 2.35 (H) 0.44 - 1.00 mg/dL   Calcium 8.4 (L) 8.9 - 10.3 mg/dL   Phosphorus 5.5 (H) 2.5 - 4.6 mg/dL   Albumin 2.1 (L) 3.5 - 5.0 g/dL   GFR calc non Af Amer 22 (L)  >60 mL/min   GFR calc Af Amer 25 (L) >60 mL/min    Comment: (NOTE) The eGFR has been calculated using the CKD EPI equation. This calculation has not been validated in all clinical situations. eGFR's persistently <60 mL/min signify possible Chronic Kidney Disease.    Anion gap 12 5 - 15    Comment: Performed at [facility] [address], [place], [place] [address]  Glucose, capillary     Status: None   Collection Time: 11/15/17  6:31 AM  Result Value Ref Range   Glucose-Capillary 95 65 - 99 mg/dL  Glucose, capillary     Status: Abnormal   Collection Time: 11/15/17 11:43 AM  Result Value Ref Range   Glucose-Capillary 129 (H) 65 - 99 mg/dL  H. pylori antibody, IgG     Status: None   Collection Time: 11/15/17 12:04 PM  Result Value Ref Range   H Pylori IgG <0.80 0.00 - 0.79 Index Value    Comment: (NOTE)                             Negative           <  0.80                             Equivocal    0.80 - 0.89                             Positive           >0.89 Performed At: [facility] [place], [place] [id number] Rush Farmer MD KT:[id number] Performed at [facility], [place] [address], [place], [place] [address]   Glucose, capillary     Status: Abnormal   Collection Time: 11/15/17  4:19 PM  Result Value Ref Range   Glucose-Capillary 129 (H) 65 - 99 mg/dL  Glucose, capillary     Status: Abnormal   Collection Time: 11/15/17  9:23 PM  Result Value Ref Range   Glucose-Capillary 145 (H) 65 - 99 mg/dL  Renal function panel     Status: Abnormal   Collection Time: 11/16/17  5:35 AM  Result Value Ref Range   Sodium 143 135 - 145 mmol/L   Potassium 4.1 3.5 - 5.1 mmol/L   Chloride 110 101 - 111 mmol/L   CO2 20 (L) 22 - 32 mmol/L   Glucose, Bld 128 (H) 65 - 99 mg/dL   BUN 75 (H) 6 - 20 mg/dL   Creatinine, Ser 2.69 (H) 0.44 - 1.00 mg/dL   Calcium 8.8 (L) 8.9 - 10.3 mg/dL   Phosphorus 5.4 (H) 2.5 - 4.6 mg/dL   Albumin 2.3 (L) 3.5 - 5.0 g/dL    GFR calc non Af Amer 19 (L) >60 mL/min   GFR calc Af Amer 22 (L) >60 mL/min    Comment: (NOTE) The eGFR has been calculated using the CKD EPI equation. This calculation has not been validated in all clinical situations. eGFR's persistently <60 mL/min signify possible Chronic Kidney Disease.    Anion gap 13 5 - 15    Comment: Performed at [facility] [address], [place], [place] [address]  Glucose, capillary     Status: Abnormal   Collection Time: 11/16/17  6:45 AM  Result Value Ref Range   Glucose-Capillary 126 (H) 65 - 99 mg/dL  Glucose, capillary     Status: Abnormal   Collection Time: 11/16/17 11:32 AM  Result Value Ref Range   Glucose-Capillary 193 (H) 65 - 99 mg/dL  Glucose, capillary     Status: Abnormal   Collection Time: 11/16/17  4:49 PM  Result Value Ref Range   Glucose-Capillary 125 (H) 65 - 99 mg/dL  Glucose, capillary     Status: Abnormal   Collection Time: 11/16/17  9:05 PM  Result Value Ref Range   Glucose-Capillary 155 (H) 65 - 99 mg/dL   Comment 1 Notify RN   Basic metabolic panel     Status: Abnormal   Collection Time: 11/17/17  4:58 AM  Result Value Ref Range   Sodium 142 135 - 145 mmol/L   Potassium 4.3 3.5 - 5.1 mmol/L   Chloride 110 101 - 111 mmol/L   CO2 20 (L) 22 - 32 mmol/L   Glucose, Bld 126 (H) 65 - 99 mg/dL   BUN 80 (H) 6 - 20 mg/dL   Creatinine, Ser 2.60 (H) 0.44 - 1.00 mg/dL   Calcium 8.8 (L) 8.9 - 10.3 mg/dL   GFR calc non Af Amer 19 (L) >60 mL/min   GFR calc  Af Amer 23 (L) >60 mL/min    Comment: (NOTE) The eGFR has been calculated using the CKD EPI equation. This calculation has not been validated in all clinical situations. eGFR's persistently <60 mL/min signify possible Chronic Kidney Disease.    Anion gap 12 5 - 15    Comment: Performed at [facility] [address], [place], [place] [address]  CBC with Differential/Platelet     Status: Abnormal   Collection Time: 11/17/17  4:58 AM  Result Value Ref Range   WBC  10.9 (H) 4.0 - 10.5 K/uL   RBC 4.23 3.87 - 5.11 MIL/uL   Hemoglobin 12.0 12.0 - 15.0 g/dL   HCT 36.5 36.0 - 46.0 %   MCV 86.3 78.0 - 100.0 fL   MCH 28.4 26.0 - 34.0 pg   MCHC 32.9 30.0 - 36.0 g/dL   RDW 15.8 (H) 11.5 - 15.5 %   Platelets 467 (H) 150 - 400 K/uL   Neutrophils Relative % 62 %   Neutro Abs 6.7 1.7 - 7.7 K/uL   Lymphocytes Relative 31 %   Lymphs Abs 3.4 0.7 - 4.0 K/uL   Monocytes Relative 4 %   Monocytes Absolute 0.5 0.1 - 1.0 K/uL   Eosinophils Relative 3 %   Eosinophils Absolute 0.3 0.0 - 0.7 K/uL   Basophils Relative 0 %   Basophils Absolute 0.0 0.0 - 0.1 K/uL    Comment: Performed at [facility] [address], [place], [place] [address]  Glucose, capillary     Status: Abnormal   Collection Time: 11/17/17  6:56 AM  Result Value Ref Range   Glucose-Capillary 109 (H) 65 - 99 mg/dL   Comment 1 Notify RN     General: NAD. Vital signs reviewed. HEENT: Normocephalic, atraumatic.  Cardio: RRR and no JVD. Resp: CTA B/L and Unlabored GI: BS positive and ND Musc/Skel:  No edmea or tenderness. Neuro: Alert Motor:  LUE: 5/5 proximal to distal RUE: 4+/5 proximal to distal (stable) RLE: HF: 2+/5, ADF/PF 2/5 (unchanged) LLE: HF 3-/5, ADF/PF 2+/5  Skin: Intact. Warm and dry.    Assessment/Plan: 1. Functional deficits secondary to debility and encephalopathy which require 3+ hours per day of interdisciplinary therapy in a comprehensive inpatient rehab setting. Physiatrist is providing close team supervision and 24 hour management of active medical problems listed below. Physiatrist and rehab team continue to assess barriers to discharge/monitor patient progress toward functional and medical goals. FIM: Function - Bathing Position: Wheelchair/chair at sink Body parts bathed by patient: Right arm, Left arm, Chest, Abdomen Body parts bathed by helper: Front perineal area, Buttocks, Back Bathing not applicable: Left upper leg, Left lower leg, Right lower leg,  Right upper leg Assist Level: 2 helpers  Function- Upper Body Dressing/Undressing What is the patient wearing?: Hospital gown Bra - Perfomed by patient: Thread/unthread left bra strap Bra - Perfomed by helper: Thread/unthread right bra strap, Hook/unhook bra (pull down sports bra), Thread/unthread left bra strap Pull over shirt/dress - Perfomed by patient: Thread/unthread right sleeve, Thread/unthread left sleeve, Put head through opening Pull over shirt/dress - Perfomed by helper: Pull shirt over trunk Assist Level: 2 helpers Function - Lower Body Dressing/Undressing What is the patient wearing?: Hospital Gown Position: Bed Underwear - Performed by helper: Thread/unthread right underwear leg, Thread/unthread left underwear leg Pants- Performed by helper: Thread/unthread right pants leg, Thread/unthread left pants leg, Pull pants up/down Assist for footwear: Maximal assist Assist for lower body dressing: 2 Helpers Set up : To  obtain clothing/put away  Function - Toileting Toileting activity did not occur: No continent bowel/bladder event Toileting steps completed by helper: Adjust clothing prior to toileting, Performs perineal hygiene, Adjust clothing after toileting(per [facility], NT report) Assist level: Touching or steadying assistance (Pt.75%)(per [place] [facility], NT report)  Function - Air cabin crew transfer activity did not occur: Safety/medical concerns Assist level to toilet: 2 helpers(per [place], NT report)  Function - Chair/bed transfer Chair/bed transfer method: Lateral scoot Chair/bed transfer assist level: Total assist (Pt < 25%) Chair/bed transfer assistive device: Sliding board Chair/bed transfer details: Verbal cues for precautions/safety, Verbal cues for safe use of DME/AE, Manual facilitation for weight shifting, Manual facilitation for placement  Function - Locomotion: Wheelchair Will patient use wheelchair at discharge?: Yes Type:  Manual Max wheelchair distance: 100' Assist Level: Touching or steadying assistance (Pt > 75%) Assist Level: Touching or steadying assistance (Pt > 75%) Wheel 150 feet activity did not occur: Safety/medical concerns Turns around,maneuvers to table,bed, and toilet,negotiates 3% grade,maneuvers on rugs and over doorsills: No Function - Locomotion: Ambulation Ambulation activity did not occur: Safety/medical concerns Walk 10 feet activity did not occur: Safety/medical concerns Walk 50 feet with 2 turns activity did not occur: Safety/medical concerns Walk 150 feet activity did not occur: Safety/medical concerns Walk 10 feet on uneven surfaces activity did not occur: Safety/medical concerns  Function - Comprehension Comprehension: Auditory Comprehension assist level: Understands basic 90% of the time/cues < 10% of the time  Function - Expression Expression: Verbal Expression assist level: Expresses basic 75 - 89% of the time/requires cueing 10 - 24% of the time. Needs helper to occlude trach/needs to repeat words.  Function - Social Interaction Social Interaction assist level: Interacts appropriately 50 - 74% of the time - May be physically or verbally inappropriate.  Function - Problem Solving Problem solving assist level: Solves basic 50 - 74% of the time/requires cueing 25 - 49% of the time  Function - Memory Memory assist level: Recognizes or recalls 25 - 49% of the time/requires cueing 50 - 75% of the time Patient normally able to recall (first 3 days only): That he or she is in a hospital  Medical Problem List and Plan: 1.Functional and mobility deficitssecondary to debility and encephalopathy from multiple medical issues   Cont CIR   Appreciated neurology recs, repeat MRI brain and T-spine suggesting stable/improvement. Neurology signed off. 2. DVT Prophylaxis/Anticoagulation: Pharmaceutical:Lovenox 3. Pain Management:tylenol prn 4. Mood:LCSW to follow for evaluation and  support. 5. Neuropsych: This patientis not fullycapable of making decisions onherown behalf. 6. Skin/Wound Care:Air mattress for pressure relief measures. Local measures to help managesacral /labialMASD PRAFO's for bilateral LE's 7. Fluids/Electrolytes/Nutrition:Monitor I/O.    Continue to offer supplements to help with protein calorie malnutrition.   Cont Remeron 15 8. C diff colitis:    Vancomycincompleted 2/6 Continue enteric precautions Stools are improving 9. HTN: Monitor BP bid.    Nifedipine bid increased to 60 on 2/6   Imdur increased to 90 on 2/4   Metoprolol increased to 100 BID on 2/3   Hydralazine 10 3 times a day started on 2/13   Hypertensive crisis overnight Vitals:   11/17/17 0334 11/17/17 0432  BP: (!) 186/101 (!) 171/97  Pulse: 86 87  Resp: 18   Temp: 97.8 F (36.6 C)   SpO2: 93%   10 T2DM with retinopathy, neuropathy, andnephropathy:Monitor BS ac/hs.    SSI for elevated BS CBG (last 3)  Recent Labs    11/16/17  1649 11/16/17 2105 11/17/17 0656  GLUCAP 125* 155* 109*    Relatively controlled on 2/13 11. H/o depression: Continue Effexor and Remeron.  12. Neurogenic bladder:Has chronic indwelling catheter for 2+ months.   D/ced foley and attempt to manage with I/O caths. Discussed with nursing, strict I/Os.  13.AKI onCKD with history of overload:    Cr 2.60 on 2/13, IVF started by nephrology   Encourage fluids   Appreciate nephrology recs   Cont to monitor 14. Diffuse CAD: treated medically with Metoprolol, Zetia and Plavix. 15.  ABLA on anemia of chronic disease:    Stool OB now neg   Hb 12.0 on 2/13   Appreciate GI recs, EGD/colonoscopy performed on 2/10 showing a total of 5 duodenal ulcers, which are presumed to be a source of bleeding.  16. Leukocytosis: Resolved 17. Hypoalbuminemia   Cont supplement 18. Hyperkalemia: Resolved    Cont to monitor 19. Acute lower UTI   UA is equivocal, urine  culture with Pseudomonas   Cipro started 2/11-2/13 20.  GI bleed   Believed to be secondary to duodenal ulcers   Appreciate GI recs, recommend 40 mg IV Protonix twice daily until hemoglobin stable, then p.o., will consider transition to by mouth tomorrow   H. pylori serology negative   Polyp biopsy on colonoscopy results to be sent patient for GI  LOS (Days) 12 A FACE TO FACE EVALUATION WAS PERFORMED  Hridhaan Yohn Lorie Phenix 11/17/2017, 8:59 AM

## 2017-11-17 NOTE — Progress Notes (Signed)
Nutrition Follow-up  DOCUMENTATION CODES:   Not applicable  INTERVENTION:  Continue Nepro Shake po TID, each supplement provides 425 kcal and 19 grams protein.  Continue 30 ml Prostat po BID, each supplement provides 100 kcal and 15 grams of protein.   Encourage adequate PO intake.   NUTRITION DIAGNOSIS:   Increased nutrient needs related to wound healing as evidenced by estimated needs; ongoing  GOAL:   Patient will meet greater than or equal to 90% of their needs; met via supplements  MONITOR:   PO intake, Supplement acceptance, Labs, Weight trends, I & O's, Skin  REASON FOR ASSESSMENT:   Malnutrition Screening Tool    ASSESSMENT:   57 year old female(from [place]) with T2DM with neuropathy and retinopathy, medical HTN, medical non-compliance, CKD stage IV, admission to [facility] hospital 08/21/17 for nephrotic syndrome with fluid overload and encephalopathy She was admitted to [facility] 10/30/17 with weakness and lethargy, loose stools and leucocytosis.  Meal completion has been 25-50% with 50% at meals today. Pt currently has Nepro shake and Prostat ordered and has been consuming them. Nutritional needs are being met with the aid of nutritional supplements. RD to continue with current orders. Pt encouraged to eat her food at meals and to drink her supplements. Family has been bringing in food from home/outside to aid in PO intake.    Labs and medications reviewed.   Diet Order:  DIET SOFT Room service appropriate? Yes; Fluid consistency: Thin  EDUCATION NEEDS:   Education needs have been addressed  Skin:  Skin Assessment: Skin Integrity Issues: Skin Integrity Issues:: Unstageable Unstageable: sacrum  Last BM:  2/11  Height:   Ht Readings from Last 1 Encounters:  11/05/17 4' 11"  (1.499 m)    Weight:   Wt Readings from Last 1 Encounters:  11/17/17 128 lb 6.7 oz (58.3 kg)    Ideal Body Weight:  44.5 kg  BMI:  Body mass index is 25.94 kg/m.  Estimated  Nutritional Needs:   Kcal:  1600-1800  Protein:  70-85 grams  Fluid:  Per MD    Corrin Parker, MS, RD, LDN Pager # [phone] After hours/ weekend pager # [phone]

## 2017-11-17 NOTE — Progress Notes (Signed)
Physical Therapy Note  Patient Details  Name: Laura [name] MRN: [id number] Date of Birth: 12-05-1960 Today's Date: 11/17/2017    Time: 830-927 57 minutes  1:1 No c/o pain.  Pt performs rolling to don brief and pants with min A.  Mod A for supine to sit.  Squat pivot transfer to w/c with max A. Sliding board transfer training blocked practice w/c <> mat with step under feet to improve positioning. Pt able to perform with mod cuing and mod A multiple attempts.  Sitting balance ball tap with 1 UE support and without UE support 2 x 3 minutes.  Pt placed in standard w/c with pt requiring min/mod hand over hand assist initially for w/c propulsion, progressed to min A for steering.  Pt left in w/c with needs at hand, quick release belt donned.   Laura [name] 11/17/2017, 9:28 AM

## 2017-11-18 ENCOUNTER — Inpatient Hospital Stay (HOSPITAL_COMMUNITY): Payer: No Typology Code available for payment source | Admitting: Occupational Therapy

## 2017-11-18 ENCOUNTER — Inpatient Hospital Stay (HOSPITAL_COMMUNITY): Payer: No Typology Code available for payment source | Admitting: Speech Pathology

## 2017-11-18 ENCOUNTER — Inpatient Hospital Stay (HOSPITAL_COMMUNITY): Payer: No Typology Code available for payment source | Admitting: Physical Therapy

## 2017-11-18 LAB — URINALYSIS, COMPLETE (UACMP) WITH MICROSCOPIC
BACTERIA UA: NONE SEEN
BILIRUBIN URINE: NEGATIVE
Glucose, UA: 150 mg/dL — AB
Hgb urine dipstick: NEGATIVE
Ketones, ur: NEGATIVE mg/dL
Leukocytes, UA: NEGATIVE
Nitrite: NEGATIVE
SPECIFIC GRAVITY, URINE: 1.014 (ref 1.005–1.030)
pH: 5 (ref 5.0–8.0)

## 2017-11-18 LAB — GLUCOSE, CAPILLARY
GLUCOSE-CAPILLARY: 98 mg/dL (ref 65–99)
GLUCOSE-CAPILLARY: 140 mg/dL — AB (ref 65–99)
GLUCOSE-CAPILLARY: 126 mg/dL — AB (ref 65–99)
Glucose-Capillary: 128 mg/dL — ABNORMAL HIGH (ref 65–99)

## 2017-11-18 LAB — RENAL FUNCTION PANEL
Albumin: 2.4 g/dL — ABNORMAL LOW (ref 3.5–5.0)
Anion gap: 11 (ref 5–15)
BUN: 78 mg/dL — AB (ref 6–20)
CALCIUM: 8.7 mg/dL — AB (ref 8.9–10.3)
CO2: 19 mmol/L — AB (ref 22–32)
Chloride: 111 mmol/L (ref 101–111)
Creatinine, Ser: 2.63 mg/dL — ABNORMAL HIGH (ref 0.44–1.00)
GFR calc Af Amer: 22 mL/min — ABNORMAL LOW (ref >60)
GFR calc non Af Amer: 19 mL/min — ABNORMAL LOW (ref >60)
GLUCOSE: 129 mg/dL — AB (ref 65–99)
PHOSPHORUS: 5 mg/dL — AB (ref 2.5–4.6)
Potassium: 4.1 mmol/L (ref 3.5–5.1)
SODIUM: 141 mmol/L (ref 135–145)

## 2017-11-18 LAB — OCCULT BLOOD X 1 CARD TO LAB, STOOL: FECAL OCCULT BLD: POSITIVE — AB

## 2017-11-18 MED ORDER — HYDRALAZINE HCL 25 MG PO TABS
25.0000 mg | ORAL_TABLET | Freq: Three times a day (TID) | ORAL | Status: DC
  Administered 2017-11-18 – 2017-12-02 (×42): 25 mg via ORAL
  Filled 2017-11-18 (×42): qty 1

## 2017-11-18 NOTE — Progress Notes (Addendum)
Occupational Therapy Session Note  Patient Details  Name: Laura [name] MRN: [id number] Date of Birth: 24-Jun-1961  Today's Date: 11/18/2017 OT Individual Time: 6063-0160 OT Individual Time Calculation (min): 27 min    Short Term Goals: Week 2:  OT Short Term Goal 1 (Week 2): Pt will complete LB dressing with modA OT Short Term Goal 2 (Week 2): Pt will perform shower transfer with mod A  OT Short Term Goal 3 (Week 2): Pt with complete sit <> stand with max assist of one caregiver OT Short Term Goal 4 (Week 2): Pt will complete grooming at sink side with min A   Skilled Therapeutic Interventions/Progress Updates:    Pt presented supine in bed, agreeable to OT tx session, no c/o pain. Pt completes rolling to L/R with S for rolling to R, MinA to roll to L; positioning roho cushion during rolling in preparation for seated EOB activity. Pt transitions supine>sitting EOB with MaxA. With cues/encouragement Pt able to maintain static sitting without UE support with close minguard for safety. Pt sitting EOB during majority of session, engaging in seated UB/LB exercise including AAROM to complete LE knee extension, Use of orange theraband for UE exercises with Pt completing across multiple planes, alternating between L/R UE for 10x each exercise. Pt requiring cues for technique and for transitioning to next exercise, rest breaks throughout. MaxA to return to supine and position in bed; Pt left supine in bed, bed alarm activated, call bell and needs within reach.   Therapy Documentation Precautions:  Precautions Precautions: Fall Precaution Comments: monitor blood pressure Restrictions Weight Bearing Restrictions: No   Pain: Pain Assessment Pain Assessment: No/denies pain  See Function Navigator for Current Functional Status.   Therapy/Group: Individual Therapy  Raymondo Band 11/18/2017, 3:58 PM

## 2017-11-18 NOTE — Progress Notes (Signed)
Subjective/Complaints: Patient seen this morning. She states she slept well overnight. She keeps the covers over her head. She states she wants to be discharged.  Review of systems: Denies CP, SOB, nausea, vomiting, diarrhea.  Objective: Vital Signs: Blood pressure (!) 165/84, pulse 80, temperature 97.8 F (36.6 C), temperature source Oral, resp. rate 18, height 4' 11"  (1.499 m), weight 59 kg (130 lb), SpO2 95 %. No results found. Results for orders placed or performed during the hospital encounter of 11/05/17 (from the past 72 hour(s))  Glucose, capillary     Status: Abnormal   Collection Time: 11/15/17 11:43 AM  Result Value Ref Range   Glucose-Capillary 129 (H) 65 - 99 mg/dL  H. pylori antibody, IgG     Status: None   Collection Time: 11/15/17 12:04 PM  Result Value Ref Range   H Pylori IgG <0.80 0.00 - 0.79 Index Value    Comment: (NOTE)                             Negative           <0.80                             Equivocal    0.80 - 0.89                             Positive           >0.89 Performed At: [facility] [place], [place] [id number] Rush Farmer MD XH:[id number] Performed at [facility], [place] [address] [place], [place] [address]   Glucose, capillary     Status: Abnormal   Collection Time: 11/15/17  4:19 PM  Result Value Ref Range   Glucose-Capillary 129 (H) 65 - 99 mg/dL  Glucose, capillary     Status: Abnormal   Collection Time: 11/15/17  9:23 PM  Result Value Ref Range   Glucose-Capillary 145 (H) 65 - 99 mg/dL  Renal function panel     Status: Abnormal   Collection Time: 11/16/17  5:35 AM  Result Value Ref Range   Sodium 143 135 - 145 mmol/L   Potassium 4.1 3.5 - 5.1 mmol/L   Chloride 110 101 - 111 mmol/L   CO2 20 (L) 22 - 32 mmol/L   Glucose, Bld 128 (H) 65 - 99 mg/dL   BUN 75 (H) 6 - 20 mg/dL   Creatinine, Ser 2.69 (H) 0.44 - 1.00 mg/dL   Calcium 8.8 (L) 8.9 - 10.3 mg/dL   Phosphorus 5.4 (H) 2.5 - 4.6 mg/dL   Albumin 2.3 (L) 3.5 - 5.0 g/dL   GFR calc non Af Amer 19 (L) >60 mL/min   GFR calc Af Amer 22 (L) >60 mL/min    Comment: (NOTE) The eGFR has been calculated using the CKD EPI equation. This calculation has not been validated in all clinical situations. eGFR's persistently <60 mL/min signify possible Chronic Kidney Disease.    Anion gap 13 5 - 15    Comment: Performed at [facility] [address], [place], [place] [address]  Glucose, capillary     Status: Abnormal   Collection Time: 11/16/17  6:45 AM  Result Value Ref Range   Glucose-Capillary 126 (H) 65 - 99 mg/dL  Glucose, capillary     Status: Abnormal   Collection  Time: 11/16/17 11:32 AM  Result Value Ref Range   Glucose-Capillary 193 (H) 65 - 99 mg/dL  Glucose, capillary     Status: Abnormal   Collection Time: 11/16/17  4:49 PM  Result Value Ref Range   Glucose-Capillary 125 (H) 65 - 99 mg/dL  Glucose, capillary     Status: Abnormal   Collection Time: 11/16/17  9:05 PM  Result Value Ref Range   Glucose-Capillary 155 (H) 65 - 99 mg/dL   Comment 1 Notify RN   Basic metabolic panel     Status: Abnormal   Collection Time: 11/17/17  4:58 AM  Result Value Ref Range   Sodium 142 135 - 145 mmol/L   Potassium 4.3 3.5 - 5.1 mmol/L   Chloride 110 101 - 111 mmol/L   CO2 20 (L) 22 - 32 mmol/L   Glucose, Bld 126 (H) 65 - 99 mg/dL   BUN 80 (H) 6 - 20 mg/dL   Creatinine, Ser 2.60 (H) 0.44 - 1.00 mg/dL   Calcium 8.8 (L) 8.9 - 10.3 mg/dL   GFR calc non Af Amer 19 (L) >60 mL/min   GFR calc Af Amer 23 (L) >60 mL/min    Comment: (NOTE) The eGFR has been calculated using the CKD EPI equation. This calculation has not been validated in all clinical situations. eGFR's persistently <60 mL/min signify possible Chronic Kidney Disease.    Anion gap 12 5 - 15    Comment: Performed at [facility] [address] [place], [place] [address]  CBC with Differential/Platelet     Status: Abnormal   Collection Time: 11/17/17  4:58  AM  Result Value Ref Range   WBC 10.9 (H) 4.0 - 10.5 K/uL   RBC 4.23 3.87 - 5.11 MIL/uL   Hemoglobin 12.0 12.0 - 15.0 g/dL   HCT 36.5 36.0 - 46.0 %   MCV 86.3 78.0 - 100.0 fL   MCH 28.4 26.0 - 34.0 pg   MCHC 32.9 30.0 - 36.0 g/dL   RDW 15.8 (H) 11.5 - 15.5 %   Platelets 467 (H) 150 - 400 K/uL   Neutrophils Relative % 62 %   Neutro Abs 6.7 1.7 - 7.7 K/uL   Lymphocytes Relative 31 %   Lymphs Abs 3.4 0.7 - 4.0 K/uL   Monocytes Relative 4 %   Monocytes Absolute 0.5 0.1 - 1.0 K/uL   Eosinophils Relative 3 %   Eosinophils Absolute 0.3 0.0 - 0.7 K/uL   Basophils Relative 0 %   Basophils Absolute 0.0 0.0 - 0.1 K/uL    Comment: Performed at [facility] [address], [place], [place] [address]  Glucose, capillary     Status: Abnormal   Collection Time: 11/17/17  6:56 AM  Result Value Ref Range   Glucose-Capillary 109 (H) 65 - 99 mg/dL   Comment 1 Notify RN   Glucose, capillary     Status: Abnormal   Collection Time: 11/17/17 12:03 PM  Result Value Ref Range   Glucose-Capillary 185 (H) 65 - 99 mg/dL  Glucose, capillary     Status: Abnormal   Collection Time: 11/17/17  4:43 PM  Result Value Ref Range   Glucose-Capillary 135 (H) 65 - 99 mg/dL  Glucose, capillary     Status: Abnormal   Collection Time: 11/17/17  9:43 PM  Result Value Ref Range   Glucose-Capillary 119 (H) 65 - 99 mg/dL   Comment 1 Notify RN   Glucose, capillary     Status: None  Collection Time: 11/18/17  6:31 AM  Result Value Ref Range   Glucose-Capillary 98 65 - 99 mg/dL   Comment 1 Notify RN     General: NAD. Vital signs reviewed. HEENT: Normocephalic, atraumatic.  Cardio: RRR and no JVD. Resp: CTA B/L and Unlabored GI: BS positive and ND Musc/Skel:  No edmea or tenderness. Neuro: Alert Motor:  LUE: 5/5 proximal to distal RUE: 4+/5 proximal to distal (stable) RLE: HF: 2+/5, ADF/PF 2/5 (stable) LLE: HF 3-/5, ADF/PF 2+/5 (stable) Skin: Intact. Warm and dry.    Assessment/Plan: 1. Functional  deficits secondary to debility and encephalopathy which require 3+ hours per day of interdisciplinary therapy in a comprehensive inpatient rehab setting. Physiatrist is providing close team supervision and 24 hour management of active medical problems listed below. Physiatrist and rehab team continue to assess barriers to discharge/monitor patient progress toward functional and medical goals. FIM: Function - Bathing Position: Wheelchair/chair at sink Body parts bathed by patient: Right arm, Left arm, Chest, Abdomen Body parts bathed by helper: Front perineal area, Buttocks, Back Bathing not applicable: Left upper leg, Left lower leg, Right lower leg, Right upper leg Assist Level: 2 helpers  Function- Upper Body Dressing/Undressing What is the patient wearing?: Hospital gown Bra - Perfomed by patient: Thread/unthread left bra strap Bra - Perfomed by helper: Thread/unthread right bra strap, Hook/unhook bra (pull down sports bra), Thread/unthread left bra strap Pull over shirt/dress - Perfomed by patient: Thread/unthread right sleeve, Thread/unthread left sleeve, Put head through opening Pull over shirt/dress - Perfomed by helper: Pull shirt over trunk Assist Level: 2 helpers Function - Lower Body Dressing/Undressing What is the patient wearing?: Hospital Gown Position: Bed Underwear - Performed by helper: Thread/unthread right underwear leg, Thread/unthread left underwear leg Pants- Performed by helper: Thread/unthread right pants leg, Thread/unthread left pants leg, Pull pants up/down Assist for footwear: Maximal assist Assist for lower body dressing: 2 Helpers Set up : To obtain clothing/put away  Function - Toileting Toileting activity did not occur: No continent bowel/bladder event Toileting steps completed by helper: Adjust clothing prior to toileting, Performs perineal hygiene, Adjust clothing after toileting(per [place], NT report) Assist level: Touching or steadying  assistance (Pt.75%)(per [place] Fl[place], NT report)  Function - Air cabin crew transfer activity did not occur: Safety/medical concerns Assist level to toilet: 2 helpers(per [place], NT report)  Function - Chair/bed transfer Chair/bed transfer method: Lateral scoot Chair/bed transfer assist level: Total assist (Pt < 25%) Chair/bed transfer assistive device: Sliding board Chair/bed transfer details: Verbal cues for precautions/safety, Verbal cues for safe use of DME/AE, Manual facilitation for weight shifting, Manual facilitation for placement  Function - Locomotion: Wheelchair Will patient use wheelchair at discharge?: Yes Type: Manual Max wheelchair distance: 100' Assist Level: Touching or steadying assistance (Pt > 75%) Assist Level: Touching or steadying assistance (Pt > 75%) Wheel 150 feet activity did not occur: Safety/medical concerns Turns around,maneuvers to table,bed, and toilet,negotiates 3% grade,maneuvers on rugs and over doorsills: No Function - Locomotion: Ambulation Ambulation activity did not occur: Safety/medical concerns Walk 10 feet activity did not occur: Safety/medical concerns Walk 50 feet with 2 turns activity did not occur: Safety/medical concerns Walk 150 feet activity did not occur: Safety/medical concerns Walk 10 feet on uneven surfaces activity did not occur: Safety/medical concerns  Function - Comprehension Comprehension: Auditory Comprehension assist level: Follows basic conversation/direction with extra time/assistive device  Function - Expression Expression: Verbal Expression assist level: Expresses basic 90% of the time/requires cueing < 10% of  the time.  Function - Social Interaction Social Interaction assist level: Interacts appropriately 75 - 89% of the time - Needs redirection for appropriate language or to initiate interaction.  Function - Problem Solving Problem solving assist level: Solves basic 50 - 74% of the  time/requires cueing 25 - 49% of the time  Function - Memory Memory assist level: Recognizes or recalls 25 - 49% of the time/requires cueing 50 - 75% of the time Patient normally able to recall (first 3 days only): That he or she is in a hospital  Medical Problem List and Plan: 1.Functional and mobility deficitssecondary to debility and encephalopathy from multiple medical issues   Cont CIR   Appreciated neurology recs, repeat MRI brain and T-spine suggesting stable/improvement. Neurology signed off. 2. DVT Prophylaxis/Anticoagulation: Pharmaceutical:Lovenox 3. Pain Management:tylenol prn 4. Mood:LCSW to follow for evaluation and support. 5. Neuropsych: This patientis not fullycapable of making decisions onherown behalf. 6. Skin/Wound Care:Air mattress for pressure relief measures. Local measures to help managesacral /labialMASD PRAFO's for bilateral LE's 7. Fluids/Electrolytes/Nutrition:Monitor I/O.    Continue to offer supplements to help with protein calorie malnutrition.   Cont Remeron 15 8. C diff colitis:    Vancomycincompleted 2/6 Continue enteric precautions Stools are improving 9. HTN: Monitor BP bid.    Nifedipine bid increased to 60 on 2/6   Imdur increased to 90 on 2/4   Metoprolol increased to 100 BID on 2/3   Hydralazine 10 3 times a day started on 2/13, increased to 25 on 2/14 Vitals:   11/17/17 2342 11/18/17 0625  BP: (!) 170/101 (!) 165/84  Pulse: 93 80  Resp:  18  Temp:  97.8 F (36.6 C)  SpO2: 96% 95%  10 T2DM with retinopathy, neuropathy, andnephropathy:Monitor BS ac/hs.    SSI for elevated BS CBG (last 3)  Recent Labs    11/17/17 1643 11/17/17 2143 11/18/17 0631  GLUCAP 135* 119* 98    Relatively controlled on 2/14 11. H/o depression: Continue Effexor and Remeron.  12. Neurogenic bladder:Has chronic indwelling catheter for 2+ months.   D/ced foley and attempt to manage with I/O caths.  Discussed with nursing, strict I/Os.  13.AKI onCKD with history of overload:    Cr 2.60 on 2/13, IVF started by nephrology   Labs ordered for tomorrow   Encourage fluids   Appreciate nephrology recs   Cont to monitor 14. Diffuse CAD: treated medically with Metoprolol, Zetia and Plavix. 15.  ABLA on anemia of chronic disease:    Stool OB now neg   Hb 12.0 on 2/13   Labs ordered for tomorrow   Appreciate GI recs, EGD/colonoscopy performed on 2/10 showing a total of 5 duodenal ulcers, which are presumed to be a source of bleeding.  16. Leukocytosis: Resolved 17. Hypoalbuminemia   Cont supplement 18. Hyperkalemia: Resolved   Cont to monitor 19. Acute lower UTI   UA is equivocal, urine culture with Pseudomonas   Cipro completed 2/11-2/13 20.  GI bleed   Believed to be secondary to duodenal ulcers   Appreciate GI recs, recommend 40 mg IV Protonix twice daily until hemoglobin stable, then p.o., will consider transition to by mouth tomorrow   H. pylori serology negative   Polyp biopsy on colonoscopy results to be sent patient for GI   Labs ordered for tomorrow 21. Leukocytosis   WBCs 10.9 on 2/13  Repeat UA/Ucx ordered   Labs ordered for tomorrow  LOS (Days) 13 A FACE TO FACE EVALUATION WAS PERFORMED  Ankit Lorie Phenix  11/18/2017, 9:24 AM

## 2017-11-18 NOTE — Progress Notes (Signed)
S:No complaints this am but not as verbal. O:BP (!) 165/84 (BP Location: Right Arm)   Pulse 80   Temp 97.8 F (36.6 C) (Oral)   Resp 18   Ht 4\' 11"  (1.499 m)   Wt 59 kg (130 lb)   SpO2 95%   BMI 26.26 kg/m   Intake/Output Summary (Last 24 hours) at 11/18/2017 1230 Last data filed at 11/18/2017 0817 Gross per 24 hour  Intake 210 ml  Output 525 ml  Net -315 ml   Intake/Output: I/O last 3 completed shifts: In: 1090 [P.O.:390; I.V.:600; NG/GT:100] Out: 1325 [Urine:1260; Stool:65]  Intake/Output this shift:  Total I/O In: 60 [P.O.:60] Out: -  Weight change: 0.718 kg (1 lb 9.3 oz) Gen: NAD CVS: no rub Resp: cta Abd: benign Ext: 1+ edema lower extremities.  Recent Labs  Lab 11/12/17 0515 11/13/17 0501 11/14/17 0755 11/15/17 0511 11/16/17 0535 11/17/17 0458 11/18/17 1110  NA 140 141 143 142 143 142 141  K 5.0 5.1 4.7 4.4 4.1 4.3 4.1  CL 110 110 109 111 110 110 111  CO2 20* 16* 18* 19* 20* 20* 19*  GLUCOSE 96 115* 88 92 128* 126* 129*  BUN 82* 81* 76* 69* 75* 80* 78*  CREATININE 2.58* 2.64* 2.55* 2.35* 2.69* 2.60* 2.63*  ALBUMIN  --  2.5* 2.4* 2.1* 2.3*  --  2.4*  CALCIUM 8.2* 8.8* 8.8* 8.4* 8.8* 8.8* 8.7*  PHOS  --  7.3* 6.3* 5.5* 5.4*  --  5.0*   Liver Function Tests: Recent Labs  Lab 11/15/17 0511 11/16/17 0535 11/18/17 1110  ALBUMIN 2.1* 2.3* 2.4*   No results for input(s): LIPASE, AMYLASE in the last 168 hours. No results for input(s): AMMONIA in the last 168 hours. CBC: Recent Labs  Lab 11/12/17 0515 11/13/17 0501 11/17/17 0458  WBC 7.9 8.6 10.9*  NEUTROABS 4.0  --  6.7  HGB 6.9* 11.7* 12.0  HCT 21.9* 35.9* 36.5  MCV 87.3 87.6 86.3  PLT 475* 488* 467*   Cardiac Enzymes: No results for input(s): CKTOTAL, CKMB, CKMBINDEX, TROPONINI in the last 168 hours. CBG: Recent Labs  Lab 11/17/17 1203 11/17/17 1643 11/17/17 2143 11/18/17 0631 11/18/17 1153  GLUCAP 185* 135* 119* 98 140*    Iron Studies: No results for input(s): IRON, TIBC,  TRANSFERRIN, FERRITIN in the last 72 hours. Studies/Results: No results found. . collagenase   Topical Daily  . enoxaparin (LOVENOX) injection  30 mg Subcutaneous Q24H  . ezetimibe  10 mg Oral Daily  . feeding supplement (NEPRO CARB STEADY)  237 mL Oral TID WC  . feeding supplement (PRO-STAT SUGAR FREE 64)  30 mL Oral BID  . glycerin (Pediatric)  1 suppository Rectal QPC supper  . hydrALAZINE  25 mg Oral Q8H  . insulin aspart  0-9 Units Subcutaneous TID WC  . isosorbide mononitrate  90 mg Oral Daily  . metoprolol tartrate  100 mg Oral BID  . mirtazapine  15 mg Oral QHS  . multivitamin  1 tablet Oral QHS  . NIFEdipine  60 mg Oral BID  . pantoprazole  40 mg Oral BID AC  . patiromer  8.4 g Oral Daily  . sodium bicarbonate  650 mg Oral BID  . venlafaxine XR  150 mg Oral Q breakfast    BMET    Component Value Date/Time   NA 141 11/18/2017 1110   K 4.1 11/18/2017 1110   CL 111 11/18/2017 1110   CO2 19 (L) 11/18/2017 1110   GLUCOSE 129 (H) 11/18/2017  1110   BUN 78 (H) 11/18/2017 1110   CREATININE 2.63 (H) 11/18/2017 1110   CALCIUM 8.7 (L) 11/18/2017 1110   GFRNONAA 19 (L) 11/18/2017 1110   GFRAA 22 (L) 11/18/2017 1110   CBC    Component Value Date/Time   WBC 10.9 (H) 11/17/2017 0458   RBC 4.23 11/17/2017 0458   HGB 12.0 11/17/2017 0458   HCT 36.5 11/17/2017 0458   PLT 467 (H) 11/17/2017 0458   MCV 86.3 11/17/2017 0458   MCH 28.4 11/17/2017 0458   MCHC 32.9 11/17/2017 0458   RDW 15.8 (H) 11/17/2017 0458   LYMPHSABS 3.4 11/17/2017 0458   MONOABS 0.5 11/17/2017 0458   EOSABS 0.3 11/17/2017 0458   BASOSABS 0.0 11/17/2017 0458     Assessment/Plan:  1. AKI/CKD stage 4- related to poor po intake and UGI bleed from duodenal ulcers. Renal functionwasimproving but poor po intake and now with bump in BUN/Cr which have improved with IVF's.  Continue to encourage po intake. 2. ABLA due to GI Bleed and anemia of CKD stage 4- s/p transfusion 3. Metabolic acidosis due to #1-  improving with po bicarb. 4. Fever- s/p completion of po vancomycin for recent C Diff 5. HTN- stable 6. Severe deconditioning/debilitation- cont with PT/OT with inpatient rehab.    Donetta Potts, MD [employer] [phone]

## 2017-11-18 NOTE — Patient Care Conference (Signed)
Inpatient RehabilitationTeam Conference and Plan of Care Update Date: 11/17/2017   Time: 2:20 PM    Patient Name: Laura [name]      Medical Record Number: [id number]  Date of Birth: March 21, 1961 Sex: Female         Room/Bed: 4M12C/4M12C-01 Payor Info: Payor: [employer] / Plan: [employer] / Product Type: *No Product type* /    Admitting Diagnosis: [place] Debility  Admit Date/Time:  11/05/2017  5:50 PM Admission Comments: No comment available   Primary Diagnosis:  <principal problem not specified> Principal Problem: <principal problem not specified>  Patient Active Problem List   Diagnosis Date Noted  . Hypertensive crisis   . Duodenal ulcer   . Acute lower UTI   . Gastrointestinal hemorrhage associated with duodenal ulcer   . Uncontrolled hypertension   . Anticoagulated   . Benign neoplasm of transverse colon   . Abnormal urinalysis   . Nephrotic syndrome   . Rectal bleeding   . Abnormal MRI   . Uncontrolled type 2 diabetes mellitus with hyperglycemia ([place])   . Diabetic peripheral neuropathy ([place])   . Weakness of both lower extremities   . Hyperkalemia   . Hypoglycemia   . Type 2 diabetes mellitus with complication, with long-term current use of insulin ([place])   . Neurogenic bladder   . Hypoalbuminemia due to protein-calorie malnutrition ([place])   . AKI (acute kidney injury) ([place])   . Stage 3 chronic kidney disease ([place])   . Physical debility 11/05/2017  . Critical illness myopathy   . Encephalopathy   . Diabetes mellitus type 2 in nonobese ([facility])   . Hyperlipidemia   . Cognitive impairment   . Benign essential HTN   . Chronic diastolic congestive heart failure ([place])   . Chronic kidney disease (CKD), stage IV (severe) ([place])   . Acute blood loss anemia   . Anemia   . Leukocytosis   . C. difficile colitis   . Urinary tract infection 10/30/2017  . Pressure injury of skin 10/30/2017    Expected Discharge Date: Expected Discharge Date: 11/27/17  Team Members  Present: Physician leading conference: Dr. Delice Lesch Social Worker Present: Lennart Pall, LCSW Nurse Present: Rayetta Humphrey, RN PT Present: Roderic Ovens, PT OT Present: [name], OT SLP Present: Windell Moulding, SLP PPS Coordinator present : Ileana Ladd, PT     Current Status/Progress Goal Weekly Team Focus  Medical   Functional and mobility deficits secondary to debility and encephalopathy from multiple medical issues  Improve mobility, safety, BP, neurogenic bladder, duodenal ulcers, , AKI/CKD, UTI  See above   Bowel/Bladder   patient is incontinent of bowel, in/out cath q 6hrs, total assist  less incidents of incontinence, regain urinary function with mod assist  continue to offer toileting q 2hr when awake & in/out caths   Swallow/Nutrition/ Hydration             ADL's   min assist UB bathing/dressing, +2 for LB bathing/dressing due to BLE weakness and decreased endurance with standing, +2 transfers  Min assist overall  ADL retraining, activity tolerance, postural control, strengthening   Mobility   mod A sliding board  min A transfers, supervision w/c  transfers, sitting balance, w/c mobility   Communication             Safety/Cognition/ Behavioral Observations  mod assist   supervision   orientation, recall of basic information, basic problem solving    Pain   no c/o pain during shift, has tylenol &  robaxin prn  pain scale <4  continue to assess & treat as needed   Skin   unstageable wound on sacrum/ santyl & moistened gauze with foam daily, swelling to labia, slight redness to groin, redness rash to RUE  no new areas of skin break down  assess q shift      *See Care Plan and progress notes for long and short-term goals.     Barriers to Discharge  Current Status/Progress Possible Resolutions Date Resolved   Physician    Medical stability;Decreased caregiver support;Incontinence;Neurogenic Bowel & Bladder;Wound Care;Lack of/limited family support     See above   Therapies, optimize BP meds, follow labs, Nephro/GI recs, abx for UTI      Nursing                  PT                    OT                  SLP                SW                Discharge Planning/Teaching Needs:  Spouse aware that goals are set for min assist w/c level and need for 24/7 care.  He reports a "friend" will be providing some support to help family.  Ready to begin teaching anytime per team.   Team Discussion:  GI has signed off - stable.  Addressing bowels and sacral wound.  Started slide board transfers today and it went well.  Trial of reg wheelchair.  At home, someone must assist pt will pressure relief.  Pt still with some confabulation;  Poor sequencing and mod-max overall with cognition.  +2 with lower body self care. Still have goals for min assist at w/c level.  Revisions to Treatment Plan:  None    Continued Need for Acute Rehabilitation Level of Care: The patient requires daily medical management by a physician with specialized training in physical medicine and rehabilitation for the following conditions: Daily direction of a multidisciplinary physical rehabilitation program to ensure safe treatment while eliciting the highest outcome that is of practical value to the patient.: Yes Daily medical management of patient stability for increased activity during participation in an intensive rehabilitation regime.: Yes Daily analysis of laboratory values and/or radiology reports with any subsequent need for medication adjustment of medical intervention for : Diabetes problems;Wound care problems;Blood pressure problems;Urological problems;Renal problems;Other;Neurological problems  Laura [name], [place] 11/18/2017, 2:08 PM

## 2017-11-18 NOTE — Progress Notes (Signed)
Physical Therapy Session Note  Patient Details  Name: Laura [name] MRN: [id number] Date of Birth: 01/10/1961  Today's Date: 11/18/2017 PT Individual Time: 1300-1356 PT Individual Time Calculation (min): 56 min   Short Term Goals: Week 2:  PT Short Term Goal 1 (Week 2): Pt will perform bed to/from wheelchair transfer with mod assist PT Short Term Goal 2 (Week 2): Pt will maintain dynamic sitting balance with min assist PT Short Term Goal 3 (Week 2): Pt will perform sit to/from stand transfer with LRAD with max assist consistently  Skilled Therapeutic Interventions/Progress Updates:   Pt in w/c and agreeable to therapy, denies pain. Worked on general strengthening and endurance w/ upright activity this session. Pt self-propelled w/c to/from day room w/ min assist overall in 100-150' bouts, brief rest in between bouts. Min assist for occasional hand-over-hand instruction of self-propulsion technique, verbal cues for technique, and occasional manual assist for obstacle avoidance. Performed sit<>stand x2 at standing frame (total assist, pt performed 10-20% work), tolerating 5-7 minutes of static stance each time. Performed cognitive sequencing task in standing w/ flash cards and min cues for task completion. Returned to room via w/c and ended session in w/c, call bell within reach and all needs met.   Therapy Documentation Precautions:  Precautions Precautions: Fall Precaution Comments: monitor blood pressure Restrictions Weight Bearing Restrictions: No Pain: Pain Assessment Pain Assessment: No/denies pain  See Function Navigator for Current Functional Status.   Therapy/Group: Individual Therapy  Amy K Arnette 11/18/2017, 1:57 PM  

## 2017-11-18 NOTE — Progress Notes (Signed)
Occupational Therapy Session Note  Patient Details  Name: Laura [name] MRN: [id number] Date of Birth: Oct 18, 1960  Today's Date: 11/18/2017 OT Individual Time: 1020-1100 OT Individual Time Calculation (min): 40 min    Short Term Goals: Week 1:  OT Short Term Goal 1 (Week 1): Pt will completed UB dressing with minA  OT Short Term Goal 1 - Progress (Week 1): Met OT Short Term Goal 2 (Week 1): Pt will complete LB dressing with modA OT Short Term Goal 2 - Progress (Week 1): Progressing toward goal OT Short Term Goal 3 (Week 1): Pt will perform shower transfer with mod A  OT Short Term Goal 3 - Progress (Week 1): Progressing toward goal OT Short Term Goal 4 (Week 1): Pt will particpiate in OOB activities for 30 mins OT Short Term Goal 4 - Progress (Week 1): Met OT Short Term Goal 5 (Week 1): Pt will complete grooming at sink side with min A  OT Short Term Goal 5 - Progress (Week 1): Progressing toward goal Week 2:  OT Short Term Goal 1 (Week 2): Pt will complete LB dressing with modA OT Short Term Goal 2 (Week 2): Pt will perform shower transfer with mod A  OT Short Term Goal 3 (Week 2): Pt with complete sit <> stand with max assist of one caregiver OT Short Term Goal 4 (Week 2): Pt will complete grooming at sink side with min A   Skilled Therapeutic Interventions/Progress Updates:    Pt seen this session to focus on postural control and LE strength.  Pt received in w/c already dressed for the day. Pt was shivering saying she was cold and requesting to go to bed. Pt was able to be distracted to engage in therapy.   -Pt continues to sit in a severe posterior tilt in which she has difficulty with upright posture/balance.  To facilitate anterior tilt, a blanket placed around low back and therapist pulled ends of blanket forward to bring pelvis forward as pt worked on actively lifting sternum and reaching arms upward.  Pt held this position with mod A as she also worked on general UE AROM  exercises.   -lateral wt shifting with hip hikes with pushing through arm rests alternating sides -B hands on arm rests pushing up from sit to elevating hips slightly with max A. -LE AROM exercises focusing on knee extension, hip abd/add Placed a rolled towel in lumbar spine to promote spinal extension.  Pt sitting in w/c with quick release belt on and chair alarm on.   Therapy Documentation Precautions:  Precautions Precautions: Fall Precaution Comments: monitor blood pressure Restrictions Weight Bearing Restrictions: No     Pain: Pain Assessment Pain Assessment: No/denies pain ADL:   See Function Navigator for Current Functional Status.   Therapy/Group: Individual Therapy  [place] 11/18/2017, 12:55 PM

## 2017-11-18 NOTE — Progress Notes (Signed)
Social Work Patient ID: Laura [name], female   DOB: [birth year], 57 y.o.   MRN: [id number]   Have reviewed team conference with pt who appears to have basic understanding of w/c level goals and target d/c date.  Let a lengthy VM for spouse and asked that he contact me ASAP to confirm how 24/7 care will be provided and by whom so we can begin education.  Will keep team posted on this for scheduling.  [name], LCSW

## 2017-11-18 NOTE — Progress Notes (Signed)
Speech Language Pathology Daily Session Note  Patient Details  Name: Laura [name] MRN: [id number] Date of Birth: 1960-10-12  Today's Date: 11/18/2017 SLP Individual Time: 0905-1000 SLP Individual Time Calculation (min): 55 min  Short Term Goals: Week 2: SLP Short Term Goal 1 (Week 2): Pt will utilize external memory aides to recall new dialy information with Mod A cues.  SLP Short Term Goal 2 (Week 2): Pt will complete basic familiar problem solving task related to ADL with Min A cues.  SLP Short Term Goal 3 (Week 2): Pt will sustain attention to basic familiar task for ~ 10 minutes with Min A cues.  SLP Short Term Goal 4 (Week 2): Pt will increase vocal intensity to achieve intelligibility in mildly noisy environments with mod verbal cues.    Skilled Therapeutic Interventions:  Pt was seen for skilled ST targeting cognitive goals.  Pt was asleep upon arrival but awakened easily to voice and light touch.  Increased confusion noted upon awakening and pt was not initially speaking [place] but rather her native language.  With time, pt did begin speaking in [place] but was very difficult to understand due to rapid rate of speech, low vocal intensity, and language of confusion.  Pt had no awareness of decreased intelligibility due to cognitive deficits and made no attempts to modulate her behavior even with cues from therapist.  At one point during therapy session, pt began speaking threateningly to pictures of friends and family members posted in her room.   Pt was redirected by therapist providing orientation information which she could recall after and ~3 minute delay.  Pt sustained her attention to a basic card game for ~5 minute intervals with mod verbal cues for redirection to task.  Pt was returned to room and left in wheelchair with chair alarm set, quick release belt donned, and call bell within reach.   Continue per current plan of care.     Function:  Eating Eating                  Cognition Comprehension Comprehension assist level: Follows basic conversation/direction with extra time/assistive device  Expression   Expression assist level: Expresses basic 50 - 74% of the time/requires cueing 25 - 49% of the time. Needs to repeat parts of sentences.  Social Interaction Social Interaction assist level: Interacts appropriately 25 - 49% of time - Needs frequent redirection.  Problem Solving Problem solving assist level: Solves basic 50 - 74% of the time/requires cueing 25 - 49% of the time  Memory Memory assist level: Recognizes or recalls 25 - 49% of the time/requires cueing 50 - 75% of the time    Pain Pain Assessment Pain Assessment: No/denies pain  Therapy/Group: Individual Therapy  Analya Louissaint, Selinda Orion 11/18/2017, 12:49 PM

## 2017-11-19 ENCOUNTER — Inpatient Hospital Stay (HOSPITAL_COMMUNITY): Payer: No Typology Code available for payment source | Admitting: Occupational Therapy

## 2017-11-19 ENCOUNTER — Inpatient Hospital Stay (HOSPITAL_COMMUNITY): Payer: No Typology Code available for payment source | Admitting: Speech Pathology

## 2017-11-19 ENCOUNTER — Inpatient Hospital Stay (HOSPITAL_COMMUNITY): Payer: No Typology Code available for payment source

## 2017-11-19 ENCOUNTER — Inpatient Hospital Stay (HOSPITAL_COMMUNITY): Payer: No Typology Code available for payment source | Admitting: Physical Therapy

## 2017-11-19 LAB — GLUCOSE, CAPILLARY
GLUCOSE-CAPILLARY: 116 mg/dL — AB (ref 65–99)
GLUCOSE-CAPILLARY: 119 mg/dL — AB (ref 65–99)
Glucose-Capillary: 105 mg/dL — ABNORMAL HIGH (ref 65–99)
Glucose-Capillary: 88 mg/dL (ref 65–99)

## 2017-11-19 LAB — RENAL FUNCTION PANEL
Albumin: 2.2 g/dL — ABNORMAL LOW (ref 3.5–5.0)
Anion gap: 13 (ref 5–15)
BUN: 80 mg/dL — ABNORMAL HIGH (ref 6–20)
CALCIUM: 8.5 mg/dL — AB (ref 8.9–10.3)
CHLORIDE: 108 mmol/L (ref 101–111)
CO2: 18 mmol/L — AB (ref 22–32)
CREATININE: 2.68 mg/dL — AB (ref 0.44–1.00)
GFR calc Af Amer: 22 mL/min — ABNORMAL LOW (ref >60)
GFR calc non Af Amer: 19 mL/min — ABNORMAL LOW (ref >60)
Glucose, Bld: 112 mg/dL — ABNORMAL HIGH (ref 65–99)
Phosphorus: 5.2 mg/dL — ABNORMAL HIGH (ref 2.5–4.6)
Potassium: 4.1 mmol/L (ref 3.5–5.1)
SODIUM: 139 mmol/L (ref 135–145)

## 2017-11-19 LAB — CBC WITH DIFFERENTIAL/PLATELET
BASOS PCT: 1 %
Basophils Absolute: 0 10*3/uL (ref 0.0–0.1)
EOS ABS: 0.3 10*3/uL (ref 0.0–0.7)
Eosinophils Relative: 5 %
HEMATOCRIT: 35.5 % — AB (ref 36.0–46.0)
HEMOGLOBIN: 11.4 g/dL — AB (ref 12.0–15.0)
LYMPHS ABS: 2.4 10*3/uL (ref 0.7–4.0)
Lymphocytes Relative: 32 %
MCH: 27.8 pg (ref 26.0–34.0)
MCHC: 32.1 g/dL (ref 30.0–36.0)
MCV: 86.6 fL (ref 78.0–100.0)
MONO ABS: 0.3 10*3/uL (ref 0.1–1.0)
MONOS PCT: 4 %
NEUTROS PCT: 58 %
Neutro Abs: 4.2 10*3/uL (ref 1.7–7.7)
Platelets: 430 10*3/uL — ABNORMAL HIGH (ref 150–400)
RBC: 4.1 MIL/uL (ref 3.87–5.11)
RDW: 16.2 % — AB (ref 11.5–15.5)
WBC: 7.3 10*3/uL (ref 4.0–10.5)

## 2017-11-19 LAB — C DIFFICILE QUICK SCREEN W PCR REFLEX
C DIFFICILE (CDIFF) TOXIN: NEGATIVE
C DIFFICLE (CDIFF) ANTIGEN: POSITIVE — AB

## 2017-11-19 LAB — CLOSTRIDIUM DIFFICILE BY PCR, REFLEXED: CDIFFPCR: POSITIVE — AB

## 2017-11-19 NOTE — Progress Notes (Signed)
Notified by off going RN in report of patients thoughts of harming herself (suicidal), spoke with CN, and unit Director with suicide reassessment completed. MD( Dr Naaman Plummer) was notified with message left on VM at [phone] and a return call at [phone], Information was relayed to MD per assessment,was informed to monitor patient closely throughout shift  House Coverage was also informed of current situations and per House coverage sitter to be placed until patient is evaluated by MD in the morning

## 2017-11-19 NOTE — Progress Notes (Signed)
Occupational Therapy Session Note  Patient Details  Name: Laura [name] MRN: [id number] Date of Birth: Aug 05, 1961  Today's Date: 11/19/2017 OT Individual Time: 1100-1200 OT Individual Time Calculation (min): 60 min    Short Term Goals: Week 2:  OT Short Term Goal 1 (Week 2): Pt will complete LB dressing with modA OT Short Term Goal 2 (Week 2): Pt will perform shower transfer with mod A  OT Short Term Goal 3 (Week 2): Pt with complete sit <> stand with max assist of one caregiver OT Short Term Goal 4 (Week 2): Pt will complete grooming at sink side with min A   Skilled Therapeutic Interventions/Progress Updates:    1:1. Upon arrival pt and friend present with pt tearful over not having family visit yesterday and feeling like, "life is not worth living." Used therapeutic use of self and encouragement to listen and support pt. Pt willing to bathe and dress at sink. Pt supine>sitting with MAX A and slide board transfer with min A on level surface with Vc for head hips relationship. Pt bathes UB at sink with A to wash back. Pt dons pull over shirt with supervision. Pt sit to stand with A of 1 and R knee block and VC for terminal hip extension as +2 completes clothing management. OT intentionally sets w/c back from sink to encourage trunk flexion, Pt brushes teeth at sink with supervision reaching across sink to gather items. PT practices shower transfer in prep for potential shower this weekend with stedy MOD A +1 with VC for hip extension with w/c<>TTB. Exited session with pt seated in w/c, call light in reach and all needs met.   Therapy Documentation Precautions:  Precautions Precautions: Fall Precaution Comments: monitor blood pressure Restrictions Weight Bearing Restrictions: No  See Function Navigator for Current Functional Status.   Therapy/Group: Individual Therapy  Tonny Branch 11/19/2017, 12:02 PM

## 2017-11-19 NOTE — Progress Notes (Signed)
Subjective/Complaints: Pt seen lying in bed this AM.  She slept well overnight.  She denies complaints  Review of systems: limited due to cognition, however, appears to denies CP, SOB, nausea, vomiting, diarrhea.  Objective: Vital Signs: Blood pressure (!) 158/83, pulse 93, temperature 97.7 F (36.5 C), temperature source Oral, resp. rate 20, height 4' 11"  (1.499 m), weight 59.2 kg (130 lb 10 oz), SpO2 93 %. No results found. Results for orders placed or performed during the hospital encounter of 11/05/17 (from the past 72 hour(s))  Glucose, capillary     Status: Abnormal   Collection Time: 11/16/17 11:32 AM  Result Value Ref Range   Glucose-Capillary 193 (H) 65 - 99 mg/dL  Glucose, capillary     Status: Abnormal   Collection Time: 11/16/17  4:49 PM  Result Value Ref Range   Glucose-Capillary 125 (H) 65 - 99 mg/dL  Glucose, capillary     Status: Abnormal   Collection Time: 11/16/17  9:05 PM  Result Value Ref Range   Glucose-Capillary 155 (H) 65 - 99 mg/dL   Comment 1 Notify RN   Basic metabolic panel     Status: Abnormal   Collection Time: 11/17/17  4:58 AM  Result Value Ref Range   Sodium 142 135 - 145 mmol/L   Potassium 4.3 3.5 - 5.1 mmol/L   Chloride 110 101 - 111 mmol/L   CO2 20 (L) 22 - 32 mmol/L   Glucose, Bld 126 (H) 65 - 99 mg/dL   BUN 80 (H) 6 - 20 mg/dL   Creatinine, Ser 2.60 (H) 0.44 - 1.00 mg/dL   Calcium 8.8 (L) 8.9 - 10.3 mg/dL   GFR calc non Af Amer 19 (L) >60 mL/min   GFR calc Af Amer 23 (L) >60 mL/min    Comment: (NOTE) The eGFR has been calculated using the CKD EPI equation. This calculation has not been validated in all clinical situations. eGFR's persistently <60 mL/min signify possible Chronic Kidney Disease.    Anion gap 12 5 - 15    Comment: Performed at [facility] [address], [place], [place] [address]  CBC with Differential/Platelet     Status: Abnormal   Collection Time: 11/17/17  4:58 AM  Result Value Ref Range   WBC 10.9 (H)  4.0 - 10.5 K/uL   RBC 4.23 3.87 - 5.11 MIL/uL   Hemoglobin 12.0 12.0 - 15.0 g/dL   HCT 36.5 36.0 - 46.0 %   MCV 86.3 78.0 - 100.0 fL   MCH 28.4 26.0 - 34.0 pg   MCHC 32.9 30.0 - 36.0 g/dL   RDW 15.8 (H) 11.5 - 15.5 %   Platelets 467 (H) 150 - 400 K/uL   Neutrophils Relative % 62 %   Neutro Abs 6.7 1.7 - 7.7 K/uL   Lymphocytes Relative 31 %   Lymphs Abs 3.4 0.7 - 4.0 K/uL   Monocytes Relative 4 %   Monocytes Absolute 0.5 0.1 - 1.0 K/uL   Eosinophils Relative 3 %   Eosinophils Absolute 0.3 0.0 - 0.7 K/uL   Basophils Relative 0 %   Basophils Absolute 0.0 0.0 - 0.1 K/uL    Comment: Performed at [facility] [address] [place], [place] [address]  Glucose, capillary     Status: Abnormal   Collection Time: 11/17/17  6:56 AM  Result Value Ref Range   Glucose-Capillary 109 (H) 65 - 99 mg/dL   Comment 1 Notify RN   Glucose, capillary  Status: Abnormal   Collection Time: 11/17/17 12:03 PM  Result Value Ref Range   Glucose-Capillary 185 (H) 65 - 99 mg/dL  Glucose, capillary     Status: Abnormal   Collection Time: 11/17/17  4:43 PM  Result Value Ref Range   Glucose-Capillary 135 (H) 65 - 99 mg/dL  Glucose, capillary     Status: Abnormal   Collection Time: 11/17/17  9:43 PM  Result Value Ref Range   Glucose-Capillary 119 (H) 65 - 99 mg/dL   Comment 1 Notify RN   Glucose, capillary     Status: None   Collection Time: 11/18/17  6:31 AM  Result Value Ref Range   Glucose-Capillary 98 65 - 99 mg/dL   Comment 1 Notify RN   Urinalysis, Complete w Microscopic     Status: Abnormal   Collection Time: 11/18/17  9:31 AM  Result Value Ref Range   Color, Urine YELLOW YELLOW   APPearance HAZY (A) CLEAR   Specific Gravity, Urine 1.014 1.005 - 1.030   pH 5.0 5.0 - 8.0   Glucose, UA 150 (A) NEGATIVE mg/dL   Hgb urine dipstick NEGATIVE NEGATIVE   Bilirubin Urine NEGATIVE NEGATIVE   Ketones, ur NEGATIVE NEGATIVE mg/dL   Protein, ur >=300 (A) NEGATIVE mg/dL   Nitrite NEGATIVE  NEGATIVE   Leukocytes, UA NEGATIVE NEGATIVE   RBC / HPF 0-5 0 - 5 RBC/hpf   WBC, UA 0-5 0 - 5 WBC/hpf   Bacteria, UA NONE SEEN NONE SEEN   Squamous Epithelial / LPF 0-5 (A) NONE SEEN   Hyaline Casts, UA PRESENT    Amorphous Crystal PRESENT     Comment: Performed at [facility], [address], [place], [place] [address]  Renal function panel     Status: Abnormal   Collection Time: 11/18/17 11:10 AM  Result Value Ref Range   Sodium 141 135 - 145 mmol/L   Potassium 4.1 3.5 - 5.1 mmol/L   Chloride 111 101 - 111 mmol/L   CO2 19 (L) 22 - 32 mmol/L   Glucose, Bld 129 (H) 65 - 99 mg/dL   BUN 78 (H) 6 - 20 mg/dL   Creatinine, Ser 2.63 (H) 0.44 - 1.00 mg/dL   Calcium 8.7 (L) 8.9 - 10.3 mg/dL   Phosphorus 5.0 (H) 2.5 - 4.6 mg/dL   Albumin 2.4 (L) 3.5 - 5.0 g/dL   GFR calc non Af Amer 19 (L) >60 mL/min   GFR calc Af Amer 22 (L) >60 mL/min    Comment: (NOTE) The eGFR has been calculated using the CKD EPI equation. This calculation has not been validated in all clinical situations. eGFR's persistently <60 mL/min signify possible Chronic Kidney Disease.    Anion gap 11 5 - 15    Comment: Performed at [facility] [address] [place], [place] [address]  Glucose, capillary     Status: Abnormal   Collection Time: 11/18/17 11:53 AM  Result Value Ref Range   Glucose-Capillary 140 (H) 65 - 99 mg/dL  Glucose, capillary     Status: Abnormal   Collection Time: 11/18/17  4:41 PM  Result Value Ref Range   Glucose-Capillary 126 (H) 65 - 99 mg/dL  Glucose, capillary     Status: Abnormal   Collection Time: 11/18/17  9:50 PM  Result Value Ref Range   Glucose-Capillary 128 (H) 65 - 99 mg/dL   Comment 1 Notify RN   Occult blood card to lab, stool     Status: Abnormal  Collection Time: 11/18/17 11:28 PM  Result Value Ref Range   Fecal Occult Bld POSITIVE (A) NEGATIVE    Comment: Performed at [facility], [place] [address] [place], [place] [address]  C difficile quick scan w  PCR reflex     Status: Abnormal   Collection Time: 11/19/17  6:37 AM  Result Value Ref Range   C Diff antigen POSITIVE (A) NEGATIVE   C Diff toxin NEGATIVE NEGATIVE   C Diff interpretation Results are indeterminate. See PCR results.     Comment: Performed at [facility], [place] [address] [place], [place] [address]  Glucose, capillary     Status: Abnormal   Collection Time: 11/19/17  7:12 AM  Result Value Ref Range   Glucose-Capillary 105 (H) 65 - 99 mg/dL   Comment 1 Notify RN   Renal function panel     Status: Abnormal   Collection Time: 11/19/17  7:38 AM  Result Value Ref Range   Sodium 139 135 - 145 mmol/L   Potassium 4.1 3.5 - 5.1 mmol/L   Chloride 108 101 - 111 mmol/L   CO2 18 (L) 22 - 32 mmol/L   Glucose, Bld 112 (H) 65 - 99 mg/dL   BUN 80 (H) 6 - 20 mg/dL   Creatinine, Ser 2.68 (H) 0.44 - 1.00 mg/dL   Calcium 8.5 (L) 8.9 - 10.3 mg/dL   Phosphorus 5.2 (H) 2.5 - 4.6 mg/dL   Albumin 2.2 (L) 3.5 - 5.0 g/dL   GFR calc non Af Amer 19 (L) >60 mL/min   GFR calc Af Amer 22 (L) >60 mL/min    Comment: (NOTE) The eGFR has been calculated using the CKD EPI equation. This calculation has not been validated in all clinical situations. eGFR's persistently <60 mL/min signify possible Chronic Kidney Disease.    Anion gap 13 5 - 15    Comment: Performed at [facility] [address] [place], [place] [address]  CBC with Differential/Platelet     Status: Abnormal   Collection Time: 11/19/17  7:38 AM  Result Value Ref Range   WBC 7.3 4.0 - 10.5 K/uL   RBC 4.10 3.87 - 5.11 MIL/uL   Hemoglobin 11.4 (L) 12.0 - 15.0 g/dL   HCT 35.5 (L) 36.0 - 46.0 %   MCV 86.6 78.0 - 100.0 fL   MCH 27.8 26.0 - 34.0 pg   MCHC 32.1 30.0 - 36.0 g/dL   RDW 16.2 (H) 11.5 - 15.5 %   Platelets 430 (H) 150 - 400 K/uL   Neutrophils Relative % 58 %   Neutro Abs 4.2 1.7 - 7.7 K/uL   Lymphocytes Relative 32 %   Lymphs Abs 2.4 0.7 - 4.0 K/uL   Monocytes Relative 4 %   Monocytes Absolute 0.3 0.1 -  1.0 K/uL   Eosinophils Relative 5 %   Eosinophils Absolute 0.3 0.0 - 0.7 K/uL   Basophils Relative 1 %   Basophils Absolute 0.0 0.0 - 0.1 K/uL    Comment: Performed at [facility] [address] [place], [place] [address]    General: NAD. Vital signs reviewed. HEENT: Normocephalic, atraumatic.  Cardio: RRR and no JVD. Resp: CTA B/L and unlabored GI: BS positive and ND Musc/Skel:  No edmea or tenderness. Neuro: Alert ans oriented x2 Motor:  LUE: 5/5 proximal to distal RUE: 4+/5 proximal to distal (stable) RLE: HF: 2+/5, ADF/PF 2/5 (gradually improving) LLE: HF 3-/5, ADF/PF 2+/5 (gradually improving) Skin: Intact. Warm and  dry.    Assessment/Plan: 1. Functional deficits secondary to debility and encephalopathy which require 3+ hours per day of interdisciplinary therapy in a comprehensive inpatient rehab setting. Physiatrist is providing close team supervision and 24 hour management of active medical problems listed below. Physiatrist and rehab team continue to assess barriers to discharge/monitor patient progress toward functional and medical goals. FIM: Function - Bathing Position: Wheelchair/chair at sink Body parts bathed by patient: Right arm, Left arm, Chest, Abdomen Body parts bathed by helper: Front perineal area, Buttocks, Back Bathing not applicable: Left upper leg, Left lower leg, Right lower leg, Right upper leg Assist Level: 2 helpers  Function- Upper Body Dressing/Undressing What is the patient wearing?: Hospital gown Bra - Perfomed by patient: Thread/unthread left bra strap Bra - Perfomed by helper: Thread/unthread right bra strap, Hook/unhook bra (pull down sports bra), Thread/unthread left bra strap Pull over shirt/dress - Perfomed by patient: Thread/unthread right sleeve, Thread/unthread left sleeve, Put head through opening Pull over shirt/dress - Perfomed by helper: Pull shirt over trunk Assist Level: 2 helpers Function - Lower Body  Dressing/Undressing What is the patient wearing?: Hospital Gown Position: Bed Underwear - Performed by helper: Thread/unthread right underwear leg, Thread/unthread left underwear leg Pants- Performed by helper: Thread/unthread right pants leg, Thread/unthread left pants leg, Pull pants up/down Assist for footwear: Maximal assist Assist for lower body dressing: 2 Helpers Set up : To obtain clothing/put away  Function - Toileting Toileting activity did not occur: No continent bowel/bladder event Toileting steps completed by helper: Adjust clothing prior to toileting, Performs perineal hygiene, Adjust clothing after toileting(per [place], NT report) Assist level: Touching or steadying assistance (Pt.75%)(per [place], NT report)  Function - Air cabin crew transfer activity did not occur: Safety/medical concerns Assist level to toilet: 2 helpers(per [place], NT report)  Function - Chair/bed transfer Chair/bed transfer method: Lateral scoot Chair/bed transfer assist level: Total assist (Pt < 25%) Chair/bed transfer assistive device: Sliding board Chair/bed transfer details: Verbal cues for precautions/safety, Verbal cues for safe use of DME/AE, Manual facilitation for weight shifting, Manual facilitation for placement  Function - Locomotion: Wheelchair Will patient use wheelchair at discharge?: Yes Type: Manual Max wheelchair distance: 150' Assist Level: Touching or steadying assistance (Pt > 75%) Assist Level: Touching or steadying assistance (Pt > 75%) Wheel 150 feet activity did not occur: Safety/medical concerns Assist Level: Touching or steadying assistance (Pt > 75%) Turns around,maneuvers to table,bed, and toilet,negotiates 3% grade,maneuvers on rugs and over doorsills: No Function - Locomotion: Ambulation Ambulation activity did not occur: Safety/medical concerns Walk 10 feet activity did not occur: Safety/medical concerns Walk 50 feet with 2  turns activity did not occur: Safety/medical concerns Walk 150 feet activity did not occur: Safety/medical concerns Walk 10 feet on uneven surfaces activity did not occur: Safety/medical concerns  Function - Comprehension Comprehension: Auditory Comprehension assist level: Follows basic conversation/direction with extra time/assistive device  Function - Expression Expression: Verbal Expression assist level: Expresses basic 25 - 49% of the time/requires cueing 50 - 75% of the time. Uses single words/gestures.  Function - Social Interaction Social Interaction assist level: Interacts appropriately 50 - 74% of the time - May be physically or verbally inappropriate.  Function - Problem Solving Problem solving assist level: Solves basic 50 - 74% of the time/requires cueing 25 - 49% of the time  Function - Memory Memory assist level: Recognizes or recalls 25 - 49% of the time/requires cueing 50 - 75% of the time Patient normally  able to recall (first 3 days only): That he or she is in a hospital  Medical Problem List and Plan: 1.Functional and mobility deficitssecondary to debility and encephalopathy from multiple medical issues   Cont CIR   Appreciated neurology recs, repeat MRI brain and T-spine suggesting stable/improvement. Neurology signed off. 2. DVT Prophylaxis/Anticoagulation: Pharmaceutical:Lovenox 3. Pain Management:tylenol prn 4. Mood:LCSW to follow for evaluation and support. 5. Neuropsych: This patientis not fullycapable of making decisions onherown behalf. 6. Skin/Wound Care:Air mattress for pressure relief measures. Local measures to help managesacral /labialMASD PRAFO's for bilateral LE's 7. Fluids/Electrolytes/Nutrition:Monitor I/O.    Continue to offer supplements to help with protein calorie malnutrition.   Cont Remeron 15 8. C diff colitis:    Vancomycincompleted 2/6 Continue enteric precautions Stools are  improving 9. HTN: Monitor BP bid.    Nifedipine bid increased to 60 on 2/6   Imdur increased to 90 on 2/4   Metoprolol increased to 100 BID on 2/3   Hydralazine 10 3 times a day started on 2/13, increased to 25 on 2/14   Remains elevated on 2/15, cont to monitor Vitals:   11/18/17 2226 11/19/17 0600  BP: (!) 156/99 (!) 158/83  Pulse: 88 93  Resp:    Temp:  97.7 F (36.5 C)  SpO2: 97% 93%  10 T2DM with retinopathy, neuropathy, andnephropathy:Monitor BS ac/hs.    SSI for elevated BS CBG (last 3)  Recent Labs    11/18/17 1641 11/18/17 2150 11/19/17 0712  GLUCAP 126* 128* 105*    Relatively controlled on 2/15 11. H/o depression: Continue Effexor and Remeron.  12. Neurogenic bladder:Has chronic indwelling catheter for 2+ months.   D/ced foley and attempt to manage with I/O caths. Discussed with nursing, strict I/Os.  13.AKI onCKD with history of overload:    Cr 2.68 on 2/15, IVF started by nephrology   Daily Labs    Encourage fluids   Appreciate nephrology recs   Cont to monitor 14. Diffuse CAD: treated medically with Metoprolol, Zetia and Plavix. 15.  ABLA on anemia of chronic disease:    Stool OB now neg   Hb 11.4 on 2/15   Appreciate GI recs, EGD/colonoscopy performed on 2/10 showing a total of 5 duodenal ulcers, which are presumed to be a source of bleeding.  16. Leukocytosis: Resolved 17. Hypoalbuminemia   Cont supplement 18. Hyperkalemia: Resolved   Cont to monitor 19. Acute lower UTI   UA is equivocal, urine culture with Pseudomonas   Cipro completed 2/11-2/13 20.  GI bleed   Believed to be secondary to duodenal ulcers   Appreciate GI recs, recommend 40 mg Protonix twice daily   H. pylori serology negative   Polyp biopsy on colonoscopy results to be sent patient for GI 21. Leukocytosis   WBCs 7.3 on 2/15  Repeat UA relatively unremarkable for infection, Ucx pending   Labs ordered for Monday  LOS (Days) 14 A FACE TO FACE EVALUATION WAS PERFORMED  Ankit  Lorie Phenix 11/19/2017, 9:08 AM

## 2017-11-19 NOTE — Progress Notes (Signed)
Physical Therapy Note  Patient Details  Name: Laura [name] MRN: [id number] Date of Birth: 1961/01/05 Today's Date: 11/19/2017    Time: 830-855 25 minutes  1:1  Pt with no c/o pain.  Pt tearful and emotional throughout session.  Stating "he's running around on me,  maybe I will just die, god has left me here to rot".  PT provided emotional support and encouragement.  Pt able to perform rolling with min A to don pants with total A.  PT attempted to encourage out of bed activity, pt continues to refuse.  Missed 35 minutes skilled PT.   Caleen Taaffe 11/19/2017, 9:26 AM

## 2017-11-19 NOTE — Progress Notes (Signed)
I was informed from a friend that she had severe depression and that wanted to harm herself. She also said that she to get better and is open to talking to someone. I informed Linna Hoff the PA that she had these thoughts. He told me the consult would have to go through [place] the case worker who was gone for the day.

## 2017-11-19 NOTE — Progress Notes (Signed)
Occupational Therapy Session Note  Patient Details  Name: Laura [name] MRN: [id number] Date of Birth: [birth year]  Today's Date: 11/19/2017 OT Individual Time: 2585-2778 OT Individual Time Calculation (min): 30 min    Short Term Goals: Week 2:  OT Short Term Goal 1 (Week 2): Pt will complete LB dressing with modA OT Short Term Goal 2 (Week 2): Pt will perform shower transfer with mod A  OT Short Term Goal 3 (Week 2): Pt with complete sit <> stand with max assist of one caregiver OT Short Term Goal 4 (Week 2): Pt will complete grooming at sink side with min A   Skilled Therapeutic Interventions/Progress Updates:    Treatment session with focus on sit <> stand and standing tolerance.  Pt received upright in w/c reporting having been quite upset this AM and while still upset no longer tearful.  Engaged in sit > stand with Stedy with focus on anterior weight shift, pt with increased ability to come into standing when able to pull up on Stedy bar vs without UE support.  Engaged in [place] while seated on Stedy to begin weight bearing through BLE progressing to standing in [place].  Pt only able to maintain upright standing for 10-15 seconds despite mod assist when attempting to reach for lights.  Tolerated standing ~1 min in [place] when able to rely on BUE on bar.  Returned to room and left upright in w/c per pt request.  Therapy Documentation Precautions:  Precautions Precautions: Fall Precaution Comments: monitor blood pressure Restrictions Weight Bearing Restrictions: No General:   Vital Signs: Therapy Vitals Temp: 97.9 F (36.6 C) Temp Source: Oral Pulse Rate: 78 Resp: 20 BP: 140/68 Patient Position (if appropriate): Lying Oxygen Therapy SpO2: 95 % O2 Device: Not Delivered Pain: Pain Assessment Pain Assessment: No/denies pain  See Function Navigator for Current Functional Status.   Therapy/Group: Individual Therapy  [name] 11/19/2017, 3:50 PM

## 2017-11-19 NOTE — Progress Notes (Signed)
S: very tearful today about family not coming to see her.  Refused medications and PT this morning O:BP 140/68 (BP Location: Left Arm)   Pulse 78   Temp 97.9 F (36.6 C) (Oral)   Resp 20   Ht 4\' 11"  (1.499 m)   Wt 59.2 kg (130 lb 10 oz)   SpO2 95%   BMI 26.38 kg/m   Intake/Output Summary (Last 24 hours) at 11/19/2017 1531 Last data filed at 11/19/2017 1404 Gross per 24 hour  Intake 120 ml  Output 540 ml  Net -420 ml   Intake/Output: I/O last 3 completed shifts: In: 180 [P.O.:180] Out: 1015 [Urine:1015]  Intake/Output this shift:  Total I/O In: 120 [P.O.:120] Out: 200 [Urine:200] Weight change: 0.282 kg (10 oz) Gen: emotionally distressed and crying CVS: no rub Resp: cta [id number] Ext: 1+ edema of lower extremities.  Recent Labs  Lab 11/13/17 0501 11/14/17 0755 11/15/17 0511 11/16/17 0535 11/17/17 0458 11/18/17 1110 11/19/17 0738  NA 141 143 142 143 142 141 139  K 5.1 4.7 4.4 4.1 4.3 4.1 4.1  CL 110 109 111 110 110 111 108  CO2 16* 18* 19* 20* 20* 19* 18*  GLUCOSE 115* 88 92 128* 126* 129* 112*  BUN 81* 76* 69* 75* 80* 78* 80*  CREATININE 2.64* 2.55* 2.35* 2.69* 2.60* 2.63* 2.68*  ALBUMIN 2.5* 2.4* 2.1* 2.3*  --  2.4* 2.2*  CALCIUM 8.8* 8.8* 8.4* 8.8* 8.8* 8.7* 8.5*  PHOS 7.3* 6.3* 5.5* 5.4*  --  5.0* 5.2*   Liver Function Tests: Recent Labs  Lab 11/16/17 0535 11/18/17 1110 11/19/17 0738  ALBUMIN 2.3* 2.4* 2.2*   No results for input(s): LIPASE, AMYLASE in the last 168 hours. No results for input(s): AMMONIA in the last 168 hours. CBC: Recent Labs  Lab 11/13/17 0501 11/17/17 0458 11/19/17 0738  WBC 8.6 10.9* 7.3  NEUTROABS  --  6.7 4.2  HGB 11.7* 12.0 11.4*  HCT 35.9* 36.5 35.5*  MCV 87.6 86.3 86.6  PLT 488* 467* 430*   Cardiac Enzymes: No results for input(s): CKTOTAL, CKMB, CKMBINDEX, TROPONINI in the last 168 hours. CBG: Recent Labs  Lab 11/18/17 1153 11/18/17 1641 11/18/17 2150 11/19/17 0712 11/19/17 1138  GLUCAP 140* 126* 128*  105* 88    Iron Studies: No results for input(s): IRON, TIBC, TRANSFERRIN, FERRITIN in the last 72 hours. Studies/Results: No results found. . collagenase   Topical Daily  . enoxaparin (LOVENOX) injection  30 mg Subcutaneous Q24H  . ezetimibe  10 mg Oral Daily  . feeding supplement (NEPRO CARB STEADY)  237 mL Oral TID WC  . feeding supplement (PRO-STAT SUGAR FREE 64)  30 mL Oral BID  . glycerin (Pediatric)  1 suppository Rectal QPC supper  . hydrALAZINE  25 mg Oral Q8H  . insulin aspart  0-9 Units Subcutaneous TID WC  . isosorbide mononitrate  90 mg Oral Daily  . metoprolol tartrate  100 mg Oral BID  . mirtazapine  15 mg Oral QHS  . multivitamin  1 tablet Oral QHS  . NIFEdipine  60 mg Oral BID  . pantoprazole  40 mg Oral BID AC  . patiromer  8.4 g Oral Daily  . sodium bicarbonate  650 mg Oral BID  . venlafaxine XR  150 mg Oral Q breakfast    BMET    Component Value Date/Time   NA 139 11/19/2017 0738   K 4.1 11/19/2017 0738   CL 108 11/19/2017 0738   CO2 18 (L) 11/19/2017 1093  GLUCOSE 112 (H) 11/19/2017 0738   BUN 80 (H) 11/19/2017 0738   CREATININE 2.68 (H) 11/19/2017 0738   CALCIUM 8.5 (L) 11/19/2017 0738   GFRNONAA 19 (L) 11/19/2017 0738   GFRAA 22 (L) 11/19/2017 0738   CBC    Component Value Date/Time   WBC 7.3 11/19/2017 0738   RBC 4.10 11/19/2017 0738   HGB 11.4 (L) 11/19/2017 0738   HCT 35.5 (L) 11/19/2017 0738   PLT 430 (H) 11/19/2017 0738   MCV 86.6 11/19/2017 0738   MCH 27.8 11/19/2017 0738   MCHC 32.1 11/19/2017 0738   RDW 16.2 (H) 11/19/2017 0738   LYMPHSABS 2.4 11/19/2017 0738   MONOABS 0.3 11/19/2017 0738   EOSABS 0.3 11/19/2017 0738   BASOSABS 0.0 11/19/2017 0738     Assessment/Plan:  1. AKI/CKD stage 4- related to poor po intake and UGI bleed from duodenal ulcers. Renal functionwasimproving but poor po intake and now with bump in BUN/Crwhich have improved withIVF's. Continue to encourage po intake. 2. ABLA due to GI Bleed and  anemia of CKD stage 4- s/p transfusion 3. Metabolic acidosis due to #1- improving with po bicarb. 4. Fever- s/p completion of po vancomycin for recent C Diff 5. HTN- stable 6. Severe deconditioning/debilitation- cont with PT/OT with inpatient rehab. 7. Depression/anxiety- pt very tearful and upset that her husband and children did not come to visit her yesterday for Valentine's day.  She refused her medications and morning PT.  Discussed with nurse who will have the neuropsychiatrist come and speak with her.    Donetta Potts, MD [employer] [phone]

## 2017-11-19 NOTE — Progress Notes (Signed)
Speech Language Pathology Weekly Progress and Session Note  Patient Details  Name: Laura [name] MRN: [id number] Date of Birth: 11-16-1960  Beginning of progress report period:  November 12, 2017  End of progress report period:  November 19, 2017  Today's Date: 11/19/2017 SLP Individual Time: 1410-1430 SLP Individual Time Calculation (min): 20 min  Short Term Goals: Week 2: SLP Short Term Goal 1 (Week 2): Pt will utilize external memory aides to recall new dialy information with Mod A cues.  SLP Short Term Goal 1 - Progress (Week 2): Not met SLP Short Term Goal 2 (Week 2): Pt will complete basic familiar problem solving task related to ADL with Min A cues.  SLP Short Term Goal 2 - Progress (Week 2): Not progressing SLP Short Term Goal 3 (Week 2): Pt will sustain attention to basic familiar task for ~ 10 minutes with Min A cues.  SLP Short Term Goal 3 - Progress (Week 2): Met SLP Short Term Goal 4 (Week 2): Pt will increase vocal intensity to achieve intelligibility in mildly noisy environments with mod verbal cues.   SLP Short Term Goal 4 - Progress (Week 2): Not met(inconsistent)    New Short Term Goals: Week 3: SLP Short Term Goal 1 (Week 3): Pt will utilize external memory aides to recall new dialy information with Mod A cues.  SLP Short Term Goal 2 (Week 3): Pt will sustain attention to basic familiar task for ~ 20 minutes with Min A cues.  SLP Short Term Goal 3 (Week 3): Pt will complete basic familiar problem solving task related to ADL with Min A cues.  SLP Short Term Goal 4 (Week 3): Pt will increase vocal intensity to achieve intelligibility in mildly noisy environments with mod verbal cues.    Weekly Progress Updates:  Pt has made limited gains this reporting period and has met 1 out of 4 short term goals.  Pt is currently max assist for tasks due to profound cognitive impairment.  Pt's orientation fluctuates as does her willingness to participate in therapies.  She is often  confabulatory and needs lots of cues for redirection.   Pt and family education has been limited due to pt's cognition and no family attendance in therapy sessions.  Pt would continue to benefit from skilled ST while inpatient in order to maximize functional independence and reduce burden of care prior to discharge.  Anticipate that pt will need 24/7 supervision at discharge in addition to [place] follow at next level of care.      Intensity: Minumum of 1-2 x/day, 30 to 90 minutes Frequency: 3 to 5 out of 7 days Duration/Length of Stay: 18 to 21 days Treatment/Interventions: Cognitive remediation/compensation;Cueing hierarchy;Functional tasks;Patient/family education;Therapeutic Activities;Internal/external aids   Daily Session  Skilled Therapeutic Interventions: Pt was seen for skilled ST targeting speech intelligibility goals.  Pt needed min verbal cues to increase vocal intensity and slow rate to achieve intelligibility at the conversational level.  Pt perseverated on her husband cheating on her, unclear where this is paranoia or based in reality given that overall pt's mentation was clearer today than it has been in the last 2-3 days.  Pt was left in bed with call bell within reach.  Goals updated on this date to reflect current progress and plan of care.      Function:   Eating Eating                 Cognition Comprehension Comprehension assist level: Follows basic conversation/direction with  extra time/assistive device  Expression   Expression assist level: Expresses basic 75 - 89% of the time/requires cueing 10 - 24% of the time. Needs helper to occlude trach/needs to repeat words.  Social Interaction Social Interaction assist level: Interacts appropriately 75 - 89% of the time - Needs redirection for appropriate language or to initiate interaction.  Problem Solving Problem solving assist level: Solves basic 50 - 74% of the time/requires cueing 25 - 49% of the time  Memory Memory  assist level: Recognizes or recalls 25 - 49% of the time/requires cueing 50 - 75% of the time   General    Pain Pain Assessment Pain Assessment: No/denies pain  Therapy/Group: Individual Therapy  Danasha Melman, Selinda Orion 11/19/2017, 3:47 PM

## 2017-11-20 ENCOUNTER — Inpatient Hospital Stay (HOSPITAL_COMMUNITY): Payer: No Typology Code available for payment source | Admitting: Occupational Therapy

## 2017-11-20 DIAGNOSIS — F419 Anxiety disorder, unspecified: Secondary | ICD-10-CM

## 2017-11-20 DIAGNOSIS — F329 Major depressive disorder, single episode, unspecified: Secondary | ICD-10-CM

## 2017-11-20 LAB — BASIC METABOLIC PANEL
ANION GAP: 11 (ref 5–15)
BUN: 77 mg/dL — ABNORMAL HIGH (ref 6–20)
CHLORIDE: 109 mmol/L (ref 101–111)
CO2: 19 mmol/L — AB (ref 22–32)
CREATININE: 2.72 mg/dL — AB (ref 0.44–1.00)
Calcium: 8.5 mg/dL — ABNORMAL LOW (ref 8.9–10.3)
GFR calc non Af Amer: 18 mL/min — ABNORMAL LOW (ref >60)
GFR, EST AFRICAN AMERICAN: 21 mL/min — AB (ref >60)
GLUCOSE: 92 mg/dL (ref 65–99)
Potassium: 4 mmol/L (ref 3.5–5.1)
Sodium: 139 mmol/L (ref 135–145)

## 2017-11-20 LAB — GLUCOSE, CAPILLARY
GLUCOSE-CAPILLARY: 80 mg/dL (ref 65–99)
Glucose-Capillary: 84 mg/dL (ref 65–99)
Glucose-Capillary: 113 mg/dL — ABNORMAL HIGH (ref 65–99)
Glucose-Capillary: 128 mg/dL — ABNORMAL HIGH (ref 65–99)

## 2017-11-20 LAB — URINE CULTURE

## 2017-11-20 MED ORDER — ALPRAZOLAM 0.25 MG PO TABS
0.2500 mg | ORAL_TABLET | Freq: Three times a day (TID) | ORAL | Status: DC | PRN
  Administered 2017-11-23 – 2017-11-26 (×3): 0.25 mg via ORAL
  Filled 2017-11-20 (×3): qty 1

## 2017-11-20 NOTE — Progress Notes (Addendum)
Subjective/Complaints: Pt with suicidal ideations last night. No thoughts of carrying through with any of thoughts. Doing better with husband present (at bedside). No diarrhea  ROS: pt denies nausea, vomiting, diarrhea, cough, shortness of breath or chest pain   Objective: Vital Signs: Blood pressure (!) 167/78, pulse 85, temperature (!) 97.2 F (36.2 C), temperature source Oral, resp. rate 20, height 4' 11"  (1.499 m), weight 59.2 kg (130 lb 10 oz), SpO2 95 %. No results found. Results for orders placed or performed during the hospital encounter of 11/05/17 (from the past 72 hour(s))  Glucose, capillary     Status: Abnormal   Collection Time: 11/17/17  4:43 PM  Result Value Ref Range   Glucose-Capillary 135 (H) 65 - 99 mg/dL  Glucose, capillary     Status: Abnormal   Collection Time: 11/17/17  9:43 PM  Result Value Ref Range   Glucose-Capillary 119 (H) 65 - 99 mg/dL   Comment 1 Notify RN   Glucose, capillary     Status: None   Collection Time: 11/18/17  6:31 AM  Result Value Ref Range   Glucose-Capillary 98 65 - 99 mg/dL   Comment 1 Notify RN   Urine Culture     Status: Abnormal   Collection Time: 11/18/17  9:31 AM  Result Value Ref Range   Specimen Description URINE, RANDOM    Special Requests      NONE Performed at [facility], [address] [place], [place] [address]    Culture MULTIPLE SPECIES PRESENT, SUGGEST RECOLLECTION (A)    Report Status 11/20/2017 FINAL   Urinalysis, Complete w Microscopic     Status: Abnormal   Collection Time: 11/18/17  9:31 AM  Result Value Ref Range   Color, Urine YELLOW YELLOW   APPearance HAZY (A) CLEAR   Specific Gravity, Urine 1.014 1.005 - 1.030   pH 5.0 5.0 - 8.0   Glucose, UA 150 (A) NEGATIVE mg/dL   Hgb urine dipstick NEGATIVE NEGATIVE   Bilirubin Urine NEGATIVE NEGATIVE   Ketones, ur NEGATIVE NEGATIVE mg/dL   Protein, ur >=300 (A) NEGATIVE mg/dL   Nitrite NEGATIVE NEGATIVE   Leukocytes, UA NEGATIVE NEGATIVE   RBC /  HPF 0-5 0 - 5 RBC/hpf   WBC, UA 0-5 0 - 5 WBC/hpf   Bacteria, UA NONE SEEN NONE SEEN   Squamous Epithelial / LPF 0-5 (A) NONE SEEN   Hyaline Casts, UA PRESENT    Amorphous Crystal PRESENT     Comment: Performed at [facility], [place] [address] [place], [place] [address]  Renal function panel     Status: Abnormal   Collection Time: 11/18/17 11:10 AM  Result Value Ref Range   Sodium 141 135 - 145 mmol/L   Potassium 4.1 3.5 - 5.1 mmol/L   Chloride 111 101 - 111 mmol/L   CO2 19 (L) 22 - 32 mmol/L   Glucose, Bld 129 (H) 65 - 99 mg/dL   BUN 78 (H) 6 - 20 mg/dL   Creatinine, Ser 2.63 (H) 0.44 - 1.00 mg/dL   Calcium 8.7 (L) 8.9 - 10.3 mg/dL   Phosphorus 5.0 (H) 2.5 - 4.6 mg/dL   Albumin 2.4 (L) 3.5 - 5.0 g/dL   GFR calc non Af Amer 19 (L) >60 mL/min   GFR calc Af Amer 22 (L) >60 mL/min    Comment: (NOTE) The eGFR has been calculated using the CKD EPI equation. This calculation has not been validated in all clinical situations. eGFR's persistently <60  mL/min signify possible Chronic Kidney Disease.    Anion gap 11 5 - 15    Comment: Performed at [facility] [address], [place], [place] [address]  Glucose, capillary     Status: Abnormal   Collection Time: 11/18/17 11:53 AM  Result Value Ref Range   Glucose-Capillary 140 (H) 65 - 99 mg/dL  Glucose, capillary     Status: Abnormal   Collection Time: 11/18/17  4:41 PM  Result Value Ref Range   Glucose-Capillary 126 (H) 65 - 99 mg/dL  Glucose, capillary     Status: Abnormal   Collection Time: 11/18/17  9:50 PM  Result Value Ref Range   Glucose-Capillary 128 (H) 65 - 99 mg/dL   Comment 1 Notify RN   Occult blood card to lab, stool     Status: Abnormal   Collection Time: 11/18/17 11:28 PM  Result Value Ref Range   Fecal Occult Bld POSITIVE (A) NEGATIVE    Comment: Performed at [facility], [place] [address], [place], [place] [address]  C difficile quick scan w PCR reflex     Status: Abnormal   Collection Time:  11/19/17  6:37 AM  Result Value Ref Range   C Diff antigen POSITIVE (A) NEGATIVE   C Diff toxin NEGATIVE NEGATIVE   C Diff interpretation Results are indeterminate. See PCR results.     Comment: Performed at [facility], [place] [address], [place], [place] [address]  C. Diff by PCR, Reflexed     Status: Abnormal   Collection Time: 11/19/17  6:37 AM  Result Value Ref Range   Toxigenic C. Difficile by PCR POSITIVE (A) NEGATIVE    Comment: Positive for toxigenic C. difficile with little to no toxin production. Only treat if clinical presentation suggests symptomatic illness. Performed at [facility], [place] [address], [place], [place] [address]   Glucose, capillary     Status: Abnormal   Collection Time: 11/19/17  7:12 AM  Result Value Ref Range   Glucose-Capillary 105 (H) 65 - 99 mg/dL   Comment 1 Notify RN   Renal function panel     Status: Abnormal   Collection Time: 11/19/17  7:38 AM  Result Value Ref Range   Sodium 139 135 - 145 mmol/L   Potassium 4.1 3.5 - 5.1 mmol/L   Chloride 108 101 - 111 mmol/L   CO2 18 (L) 22 - 32 mmol/L   Glucose, Bld 112 (H) 65 - 99 mg/dL   BUN 80 (H) 6 - 20 mg/dL   Creatinine, Ser 2.68 (H) 0.44 - 1.00 mg/dL   Calcium 8.5 (L) 8.9 - 10.3 mg/dL   Phosphorus 5.2 (H) 2.5 - 4.6 mg/dL   Albumin 2.2 (L) 3.5 - 5.0 g/dL   GFR calc non Af Amer 19 (L) >60 mL/min   GFR calc Af Amer 22 (L) >60 mL/min    Comment: (NOTE) The eGFR has been calculated using the CKD EPI equation. This calculation has not been validated in all clinical situations. eGFR's persistently <60 mL/min signify possible Chronic Kidney Disease.    Anion gap 13 5 - 15    Comment: Performed at [facility] [address] [place], [place] [address]  CBC with Differential/Platelet     Status: Abnormal   Collection Time: 11/19/17  7:38 AM  Result Value Ref Range   WBC 7.3 4.0 - 10.5 K/uL   RBC 4.10 3.87 - 5.11 MIL/uL   Hemoglobin 11.4 (L)  12.0 - 15.0 g/dL   HCT 35.5 (L) 36.0 -  46.0 %   MCV 86.6 78.0 - 100.0 fL   MCH 27.8 26.0 - 34.0 pg   MCHC 32.1 30.0 - 36.0 g/dL   RDW 16.2 (H) 11.5 - 15.5 %   Platelets 430 (H) 150 - 400 K/uL   Neutrophils Relative % 58 %   Neutro Abs 4.2 1.7 - 7.7 K/uL   Lymphocytes Relative 32 %   Lymphs Abs 2.4 0.7 - 4.0 K/uL   Monocytes Relative 4 %   Monocytes Absolute 0.3 0.1 - 1.0 K/uL   Eosinophils Relative 5 %   Eosinophils Absolute 0.3 0.0 - 0.7 K/uL   Basophils Relative 1 %   Basophils Absolute 0.0 0.0 - 0.1 K/uL    Comment: Performed at [facility] [address] [place], [place] [address]  Glucose, capillary     Status: None   Collection Time: 11/19/17 11:38 AM  Result Value Ref Range   Glucose-Capillary 88 65 - 99 mg/dL  Glucose, capillary     Status: Abnormal   Collection Time: 11/19/17  4:56 PM  Result Value Ref Range   Glucose-Capillary 116 (H) 65 - 99 mg/dL  Glucose, capillary     Status: Abnormal   Collection Time: 11/19/17  8:53 PM  Result Value Ref Range   Glucose-Capillary 119 (H) 65 - 99 mg/dL  Basic metabolic panel     Status: Abnormal   Collection Time: 11/20/17  5:48 AM  Result Value Ref Range   Sodium 139 135 - 145 mmol/L   Potassium 4.0 3.5 - 5.1 mmol/L   Chloride 109 101 - 111 mmol/L   CO2 19 (L) 22 - 32 mmol/L   Glucose, Bld 92 65 - 99 mg/dL   BUN 77 (H) 6 - 20 mg/dL   Creatinine, Ser 2.72 (H) 0.44 - 1.00 mg/dL   Calcium 8.5 (L) 8.9 - 10.3 mg/dL   GFR calc non Af Amer 18 (L) >60 mL/min   GFR calc Af Amer 21 (L) >60 mL/min    Comment: (NOTE) The eGFR has been calculated using the CKD EPI equation. This calculation has not been validated in all clinical situations. eGFR's persistently <60 mL/min signify possible Chronic Kidney Disease.    Anion gap 11 5 - 15    Comment: Performed at [facility] [address] [place], [place] [address]  Glucose, capillary     Status: None   Collection Time: 11/20/17  7:00 AM  Result Value Ref Range   Glucose-Capillary 84 65 - 99 mg/dL   Glucose, capillary     Status: None   Collection Time: 11/20/17 11:31 AM  Result Value Ref Range   Glucose-Capillary 80 65 - 99 mg/dL    General: NAD. Vital signs reviewed. HEENT: Normocephalic, atraumatic.  Cardio: RRR without murmur. No JVD . Resp: CTA B/L and unlabored GI: BS positive and ND Musc/Skel:  No edmea or tenderness. Neuro: Alert ans oriented x2 Motor:  LUE: 5/5 proximal to distal RUE: 4+/5 proximal to distal (stable) RLE: HF: 2+/5, ADF/PF 2/5 (stable) LLE: HF 3-/5, ADF/PF 2+/5 (stable) Skin: Intact. Warm and dry.  Psych: a little flat, cooperative. Smiled on occasion. Did not appear anxious   Assessment/Plan: 1. Functional deficits secondary to debility and encephalopathy which require 3+ hours per day of interdisciplinary therapy in a comprehensive inpatient rehab setting. Physiatrist is providing close team supervision and 24 hour management of active medical problems listed below. Physiatrist  and rehab team continue to assess barriers to discharge/monitor patient progress toward functional and medical goals. FIM: Function - Bathing Position: Wheelchair/chair at sink Body parts bathed by patient: Right arm, Left arm, Chest, Abdomen Body parts bathed by helper: Front perineal area, Buttocks, Back Bathing not applicable: Left upper leg, Left lower leg, Right lower leg, Right upper leg Assist Level: 2 helpers  Function- Upper Body Dressing/Undressing What is the patient wearing?: Hospital gown Bra - Perfomed by patient: Thread/unthread left bra strap Bra - Perfomed by helper: Thread/unthread right bra strap, Hook/unhook bra (pull down sports bra), Thread/unthread left bra strap Pull over shirt/dress - Perfomed by patient: Thread/unthread right sleeve, Thread/unthread left sleeve, Put head through opening Pull over shirt/dress - Perfomed by helper: Pull shirt over trunk Assist Level: 2 helpers Function - Lower Body Dressing/Undressing What is the patient  wearing?: Hospital Gown Position: Bed Underwear - Performed by helper: Thread/unthread right underwear leg, Thread/unthread left underwear leg Pants- Performed by helper: Thread/unthread right pants leg, Thread/unthread left pants leg, Pull pants up/down Assist for footwear: Maximal assist Assist for lower body dressing: 2 Helpers Set up : To obtain clothing/put away  Function - Toileting Toileting activity did not occur: No continent bowel/bladder event Toileting steps completed by helper: Adjust clothing prior to toileting, Performs perineal hygiene, Adjust clothing after toileting(per [place], NT report) Assist level: Touching or steadying assistance (Pt.75%)(per [employer], NT report)  Function - Air cabin crew transfer activity did not occur: Safety/medical concerns Assist level to toilet: 2 helpers(per [employer], NT report)  Function - Chair/bed transfer Chair/bed transfer method: Lateral scoot Chair/bed transfer assist level: Total assist (Pt < 25%) Chair/bed transfer assistive device: Sliding board Chair/bed transfer details: Verbal cues for precautions/safety, Verbal cues for safe use of DME/AE, Manual facilitation for weight shifting, Manual facilitation for placement  Function - Locomotion: Wheelchair Will patient use wheelchair at discharge?: Yes Type: Manual Max wheelchair distance: 150' Assist Level: Touching or steadying assistance (Pt > 75%) Assist Level: Touching or steadying assistance (Pt > 75%) Wheel 150 feet activity did not occur: Safety/medical concerns Assist Level: Touching or steadying assistance (Pt > 75%) Turns around,maneuvers to table,bed, and toilet,negotiates 3% grade,maneuvers on rugs and over doorsills: No Function - Locomotion: Ambulation Ambulation activity did not occur: Safety/medical concerns Walk 10 feet activity did not occur: Safety/medical concerns Walk 50 feet with 2 turns activity did not occur:  Safety/medical concerns Walk 150 feet activity did not occur: Safety/medical concerns Walk 10 feet on uneven surfaces activity did not occur: Safety/medical concerns  Function - Comprehension Comprehension: Auditory Comprehension assist level: Follows basic conversation/direction with extra time/assistive device  Function - Expression Expression: Verbal Expression assist level: Expresses basic 75 - 89% of the time/requires cueing 10 - 24% of the time. Needs helper to occlude trach/needs to repeat words.  Function - Social Interaction Social Interaction assist level: Interacts appropriately 75 - 89% of the time - Needs redirection for appropriate language or to initiate interaction.  Function - Problem Solving Problem solving assist level: Solves basic 50 - 74% of the time/requires cueing 25 - 49% of the time  Function - Memory Memory assist level: Recognizes or recalls 25 - 49% of the time/requires cueing 50 - 75% of the time Patient normally able to recall (first 3 days only): That he or she is in a hospital  Medical Problem List and Plan: 1.Functional and mobility deficitssecondary to debility and encephalopathy from multiple medical issues   Cont CIR  Appreciated neurology recs, repeat MRI brain and T-spine suggesting stable/improvement. Neurology signed off. 2. DVT Prophylaxis/Anticoagulation: Pharmaceutical:Lovenox 3. Pain Management:tylenol prn 4. Mood:LCSW to follow for evaluation and support.   -continue to closely monitor behavior. Pt with episodic anxiety, suicidal ideations as noted in chart    -doesn't have any thoughts of following through with ideations   -team to continue to provide support    -will have dr. Sima Matas to see pt this week  -  -xanax prn for anxiety 5. Neuropsych: This patientis not fullycapable of making decisions onherown behalf. 6. Skin/Wound Care:Air mattress for pressure relief measures. Local measures to help managesacral  /labialMASD PRAFO's for bilateral LE's 7. Fluids/Electrolytes/Nutrition:Monitor I/O.    Continue to offer supplements to help with protein calorie malnutrition.   Cont Remeron 15 8. C diff colitis:    Vancomycincompleted 2/6 Continue enteric precautions Stools are improving 9. HTN: Monitor BP bid.    Nifedipine bid increased to 60 on 2/6   Imdur increased to 90 on 2/4   Metoprolol increased to 100 BID on 2/3   Hydralazine 10 3 times a day started on 2/13, increased to 25 on 2/14   Remains elevated at times, no changes today given all recent increases Vitals:   11/19/17 2255 11/20/17 0447  BP: (!) 184/94 (!) 167/78  Pulse: 91 85  Resp:  20  Temp:  (!) 97.2 F (36.2 C)  SpO2:  95%  10 T2DM with retinopathy, neuropathy, andnephropathy:Monitor BS ac/hs.    SSI for elevated BS CBG (last 3)  Recent Labs    11/19/17 2053 11/20/17 0700 11/20/17 1131  GLUCAP 119* 84 80    Relatively controlled on 2/15 11. H/o depression: Continue Effexor and Remeron.  12. Neurogenic bladder:Has chronic indwelling catheter for 2+ months.   Foley out, I/O caths, keep strict I/Os.  13.AKI onCKD with history of overload:    Cr 2.68 on 2/15, IVF started by nephrology   Daily Labs    Encourage fluids   Appreciate nephrology recs   Cont to monitor 14. Diffuse CAD: treated medically with Metoprolol, Zetia and Plavix. 15.  ABLA on anemia of chronic disease:    Stool OB now neg   Hb 11.4 on 2/15   Appreciate GI recs, EGD/colonoscopy performed on 2/10 showing a total of 5 duodenal ulcers, which are presumed to be a source of bleeding.  16. Leukocytosis: Resolved 17. Hypoalbuminemia   Cont supplement 18. Hyperkalemia: Resolved   Cont to monitor 19. Acute lower UTI   UA is equivocal, urine culture with Pseudomonas   Cipro completed 2/11-2/13 20.  GI bleed   Believed to be secondary to duodenal ulcers   Appreciate GI recs, 40 mg Protonix twice daily   H.  pylori serology negative   Polyp biopsy on colonoscopy results to be sent patient for GI 21. Leukocytosis   WBCs 7.3 on 2/15  Repeat UA relatively unremarkable for infection, Ucx pending   Labs ordered for Monday  C diff PCR + but toxin negative. Stools are more formed, last stool was soft/mushy on 2/15 at 1400   -continue contact precautions   LOS (Days) 15 A FACE TO FACE EVALUATION WAS PERFORMED  Jaquarius Seder T 11/20/2017, 12:14 PM

## 2017-11-20 NOTE — Progress Notes (Signed)
S: Feels much better today since her husband came and spent the night in her room but expressed some suicidal ideations last night. O:BP (!) 167/78 (BP Location: Left Arm)   Pulse 85   Temp (!) 97.2 F (36.2 C) (Oral)   Resp 20   Ht 4\' 11"  (1.499 m)   Wt 59.2 kg (130 lb 10 oz)   SpO2 95%   BMI 26.38 kg/m   Intake/Output Summary (Last 24 hours) at 11/20/2017 1202 Last data filed at 11/20/2017 1002 Gross per 24 hour  Intake 780 ml  Output 800 ml  Net -20 ml   Intake/Output: I/O last 3 completed shifts: In: 50 [P.O.:180; I.V.:600] Out: 840 [Urine:840]  Intake/Output this shift:  Total I/O In: -  Out: 300 [Urine:300] Weight change:  Gen: NAD CVS: no rub Resp: cta Abd: benign Ext: 1+ edema  Recent Labs  Lab 11/14/17 0755 11/15/17 0511 11/16/17 0535 11/17/17 0458 11/18/17 1110 11/19/17 0738 11/20/17 0548  NA 143 142 143 142 141 139 139  K 4.7 4.4 4.1 4.3 4.1 4.1 4.0  CL 109 111 110 110 111 108 109  CO2 18* 19* 20* 20* 19* 18* 19*  GLUCOSE 88 92 128* 126* 129* 112* 92  BUN 76* 69* 75* 80* 78* 80* 77*  CREATININE 2.55* 2.35* 2.69* 2.60* 2.63* 2.68* 2.72*  ALBUMIN 2.4* 2.1* 2.3*  --  2.4* 2.2*  --   CALCIUM 8.8* 8.4* 8.8* 8.8* 8.7* 8.5* 8.5*  PHOS 6.3* 5.5* 5.4*  --  5.0* 5.2*  --    Liver Function Tests: Recent Labs  Lab 11/16/17 0535 11/18/17 1110 11/19/17 0738  ALBUMIN 2.3* 2.4* 2.2*   No results for input(s): LIPASE, AMYLASE in the last 168 hours. No results for input(s): AMMONIA in the last 168 hours. CBC: Recent Labs  Lab 11/17/17 0458 11/19/17 0738  WBC 10.9* 7.3  NEUTROABS 6.7 4.2  HGB 12.0 11.4*  HCT 36.5 35.5*  MCV 86.3 86.6  PLT 467* 430*   Cardiac Enzymes: No results for input(s): CKTOTAL, CKMB, CKMBINDEX, TROPONINI in the last 168 hours. CBG: Recent Labs  Lab 11/19/17 1138 11/19/17 1656 11/19/17 2053 11/20/17 0700 11/20/17 1131  GLUCAP 88 116* 119* 84 80    Iron Studies: No results for input(s): IRON, TIBC, TRANSFERRIN,  FERRITIN in the last 72 hours. Studies/Results: No results found. . collagenase   Topical Daily  . enoxaparin (LOVENOX) injection  30 mg Subcutaneous Q24H  . ezetimibe  10 mg Oral Daily  . feeding supplement (NEPRO CARB STEADY)  237 mL Oral TID WC  . feeding supplement (PRO-STAT SUGAR FREE 64)  30 mL Oral BID  . glycerin (Pediatric)  1 suppository Rectal QPC supper  . hydrALAZINE  25 mg Oral Q8H  . insulin aspart  0-9 Units Subcutaneous TID WC  . isosorbide mononitrate  90 mg Oral Daily  . metoprolol tartrate  100 mg Oral BID  . mirtazapine  15 mg Oral QHS  . multivitamin  1 tablet Oral QHS  . NIFEdipine  60 mg Oral BID  . pantoprazole  40 mg Oral BID AC  . patiromer  8.4 g Oral Daily  . sodium bicarbonate  650 mg Oral BID  . venlafaxine XR  150 mg Oral Q breakfast    BMET    Component Value Date/Time   NA 139 11/20/2017 0548   K 4.0 11/20/2017 0548   CL 109 11/20/2017 0548   CO2 19 (L) 11/20/2017 0548   GLUCOSE 92 11/20/2017 0548  BUN 77 (H) 11/20/2017 0548   CREATININE 2.72 (H) 11/20/2017 0548   CALCIUM 8.5 (L) 11/20/2017 0548   GFRNONAA 18 (L) 11/20/2017 0548   GFRAA 21 (L) 11/20/2017 0548   CBC    Component Value Date/Time   WBC 7.3 11/19/2017 0738   RBC 4.10 11/19/2017 0738   HGB 11.4 (L) 11/19/2017 0738   HCT 35.5 (L) 11/19/2017 0738   PLT 430 (H) 11/19/2017 0738   MCV 86.6 11/19/2017 0738   MCH 27.8 11/19/2017 0738   MCHC 32.1 11/19/2017 0738   RDW 16.2 (H) 11/19/2017 0738   LYMPHSABS 2.4 11/19/2017 0738   MONOABS 0.3 11/19/2017 0738   EOSABS 0.3 11/19/2017 0738   BASOSABS 0.0 11/19/2017 0738      Assessment/Plan:  1. AKI/CKD stage 4- related to poor po intake and UGI bleed from duodenal ulcers. Renal functionwasimproving but poor po intake and now with bump in BUN/Crwhich have improved withIVF's. Continue to encourage po intake. 2. ABLA due to GI Bleed and anemia of CKD stage 4- s/p transfusion 3. Metabolic acidosis due to #1- improving  with po bicarb. 4. Fever- s/p completion of po vancomycin for recent C Diff 5. HTN- stable 6. Severe deconditioning/debilitation- cont with PT/OT with inpatient rehab. 7. Depression/anxiety- pt very tearful and upset that her husband and children did not come to visit her yesterday for Valentine's day and expressed suicidal ideations yesterday.  She also refused her medications and morning PT.  Discussed with nurse who will have the neuropsychiatrist come and speak with her.  Will follow labs and see her again on Monday.   Donetta Potts, MD [employer] [phone]

## 2017-11-20 NOTE — Progress Notes (Signed)
Physical Therapy Session Note  Patient Details  Name: Laura [name] MRN: [id number] Date of Birth: 1961-06-11  Today's Date: 11/20/2017 PT Individual Time: 1130(make up time)-1200 PT Individual Time Calculation (min): 30 min   Short Term Goals: Week 2:  PT Short Term Goal 1 (Week 2): Pt will perform bed to/from wheelchair transfer with mod assist PT Short Term Goal 2 (Week 2): Pt will maintain dynamic sitting balance with min assist PT Short Term Goal 3 (Week 2): Pt will perform sit to/from stand transfer with LRAD with max assist consistently  Skilled Therapeutic Interventions/Progress Updates:   Pt supine upon arrival and agreeable to therapy, no c/o pain. Pt eager to get OOB this session. Transferred to EOB w/ mod assist and maintained static sitting balance w/ UE support w/ supervision. Transferred to w/c via slide board transfer w/ total assist. Max assist to readjust in w/c to neutral upright posture. Focused on tolerance to OOB activity this session and orienting to environment. Pt very verbose and required max cues to orient and redirect. RN present providing medication, verbal cues to attend to task of taking medication. Returned to room and provided set-up assist for lunch. Ended session in w/c, QRB engaged and call bell within reach. All needs met.   Therapy Documentation Precautions:  Precautions Precautions: Fall Precaution Comments: monitor blood pressure Restrictions Weight Bearing Restrictions: No  See Function Navigator for Current Functional Status.   Therapy/Group: Individual Therapy  Gordan Grell K Arnette 11/20/2017, 12:06 PM

## 2017-11-20 NOTE — Progress Notes (Signed)
Resting throughout shift without distress or discomfort.Closely monitor and assisted. Sitter at bedside. Remain on Contact Isolation.

## 2017-11-20 NOTE — Plan of Care (Signed)
  Not Progressing RH BLADDER ELIMINATION RH STG MANAGE BLADDER WITH ASSISTANCE Description STG Manage Bladder With Assistance mod  11/20/2017 1218 - Not Progressing by Vianca Bracher, Renato Gails, RN RH STG MANAGE BLADDER WITH MEDICATION WITH ASSISTANCE Description STG Manage Bladder With Medication With Assistance. mod  11/20/2017 1218 - Not Progressing by Aylssa Herrig, Renato Gails, RN RH STG MANAGE BLADDER WITH EQUIPMENT WITH ASSISTANCE Description STG Manage Bladder With Equipment With mod Assistance  11/20/2017 1218 - Not Progressing by Darroll Bredeson, Renato Gails, RN  Total assist with I/O cath q 8h

## 2017-11-20 NOTE — Progress Notes (Signed)
Spouse in a bedside informed of current clinical issues with patient and purpose of sitter at bedside, Receptive to information, informed MD will be this morning. Support provided

## 2017-11-21 LAB — BASIC METABOLIC PANEL
Anion gap: 12 (ref 5–15)
BUN: 82 mg/dL — AB (ref 6–20)
CALCIUM: 8.4 mg/dL — AB (ref 8.9–10.3)
CO2: 19 mmol/L — ABNORMAL LOW (ref 22–32)
CREATININE: 2.73 mg/dL — AB (ref 0.44–1.00)
Chloride: 109 mmol/L (ref 101–111)
GFR, EST AFRICAN AMERICAN: 21 mL/min — AB (ref >60)
GFR, EST NON AFRICAN AMERICAN: 18 mL/min — AB (ref >60)
Glucose, Bld: 104 mg/dL — ABNORMAL HIGH (ref 65–99)
Potassium: 4.2 mmol/L (ref 3.5–5.1)
SODIUM: 140 mmol/L (ref 135–145)

## 2017-11-21 LAB — GLUCOSE, CAPILLARY
GLUCOSE-CAPILLARY: 96 mg/dL (ref 65–99)
GLUCOSE-CAPILLARY: 93 mg/dL (ref 65–99)
Glucose-Capillary: 92 mg/dL (ref 65–99)
Glucose-Capillary: 101 mg/dL — ABNORMAL HIGH (ref 65–99)

## 2017-11-21 MED ORDER — METRONIDAZOLE 500 MG PO TABS
500.0000 mg | ORAL_TABLET | Freq: Three times a day (TID) | ORAL | Status: DC
  Administered 2017-11-21 – 2017-11-22 (×4): 500 mg via ORAL
  Filled 2017-11-21 (×5): qty 1

## 2017-11-21 NOTE — Progress Notes (Signed)
Subjective/Complaints: Patient lying in bed.  No specific new complaints today.  She states that her mood is "okay".  Per nurse patient had large episode of projectile diarrhea yesterday afternoon.  I was not contacted regarding this.  No further episodes since then.  ROS: Limited due to cognitive/behavioral   Objective: Vital Signs: Blood pressure (!) 157/78, pulse 85, temperature 97.8 F (36.6 C), temperature source Oral, resp. rate 18, height _0  (1.499 m), weight 58.1 kg (128 lb), SpO2 95 %. No results found. Results for orders placed or performed during the hospital encounter of 11/05/17 (from the past 72 hour(s))  Urine Culture     Status: Abnormal   Collection Time: 11/18/17  9:31 AM  Result Value Ref Range   Specimen Description URINE, RANDOM    Special Requests      NONE Performed at [facility], [address], [place], [place] [address]    Culture MULTIPLE SPECIES PRESENT, SUGGEST RECOLLECTION (A)    Report Status 11/20/2017 FINAL   Urinalysis, Complete w Microscopic     Status: Abnormal   Collection Time: 11/18/17  9:31 AM  Result Value Ref Range   Color, Urine YELLOW YELLOW   APPearance HAZY (A) CLEAR   Specific Gravity, Urine 1.014 1.005 - 1.030   pH 5.0 5.0 - 8.0   Glucose, UA 150 (A) NEGATIVE mg/dL   Hgb urine dipstick NEGATIVE NEGATIVE   Bilirubin Urine NEGATIVE NEGATIVE   Ketones, ur NEGATIVE NEGATIVE mg/dL   Protein, ur >=300 (A) NEGATIVE mg/dL   Nitrite NEGATIVE NEGATIVE   Leukocytes, UA NEGATIVE NEGATIVE   RBC / HPF 0-5 0 - 5 RBC/hpf   WBC, UA 0-5 0 - 5 WBC/hpf   Bacteria, UA NONE SEEN NONE SEEN   Squamous Epithelial / LPF 0-5 (A) NONE SEEN   Hyaline Casts, UA PRESENT    Amorphous Crystal PRESENT     Comment: Performed at [facility], [place] [address] [place], [place] [address]  Renal function panel     Status: Abnormal   Collection Time: 11/18/17 11:10 AM  Result Value Ref Range   Sodium 141 135 - 145 mmol/L   Potassium 4.1 3.5 -  5.1 mmol/L   Chloride 111 101 - 111 mmol/L   CO2 19 (L) 22 - 32 mmol/L   Glucose, Bld 129 (H) 65 - 99 mg/dL   BUN 78 (H) 6 - 20 mg/dL   Creatinine, Ser 2.63 (H) 0.44 - 1.00 mg/dL   Calcium 8.7 (L) 8.9 - 10.3 mg/dL   Phosphorus 5.0 (H) 2.5 - 4.6 mg/dL   Albumin 2.4 (L) 3.5 - 5.0 g/dL   GFR calc non Af Amer 19 (L) >60 mL/min   GFR calc Af Amer 22 (L) >60 mL/min    Comment: (NOTE) The eGFR has been calculated using the CKD EPI equation. This calculation has not been validated in all clinical situations. eGFR's persistently <60 mL/min signify possible Chronic Kidney Disease.    Anion gap 11 5 - 15    Comment: Performed at [facility] [address] [place], [place] [address]  Glucose, capillary     Status: Abnormal   Collection Time: 11/18/17 11:53 AM  Result Value Ref Range   Glucose-Capillary 140 (H) 65 - 99 mg/dL  Glucose, capillary     Status: Abnormal   Collection Time: 11/18/17  4:41 PM  Result Value Ref Range   Glucose-Capillary 126 (H) 65 - 99 mg/dL  Glucose, capillary  Status: Abnormal   Collection Time: 11/18/17  9:50 PM  Result Value Ref Range   Glucose-Capillary 128 (H) 65 - 99 mg/dL   Comment 1 Notify RN   Occult blood card to lab, stool     Status: Abnormal   Collection Time: 11/18/17 11:28 PM  Result Value Ref Range   Fecal Occult Bld POSITIVE (A) NEGATIVE    Comment: Performed at [facility] [address] [place], [place] [address]  C difficile quick scan w PCR reflex     Status: Abnormal   Collection Time: 11/19/17  6:37 AM  Result Value Ref Range   C Diff antigen POSITIVE (A) NEGATIVE   C Diff toxin NEGATIVE NEGATIVE   C Diff interpretation Results are indeterminate. See PCR results.     Comment: Performed at [facility], [place] [address] [place], [place] [address]  C. Diff by PCR, Reflexed     Status: Abnormal   Collection Time: 11/19/17  6:37 AM  Result Value Ref Range   Toxigenic C. Difficile by PCR POSITIVE (A) NEGATIVE     Comment: Positive for toxigenic C. difficile with little to no toxin production. Only treat if clinical presentation suggests symptomatic illness. Performed at [facility], [place] [address] [place], [place] [address]   Glucose, capillary     Status: Abnormal   Collection Time: 11/19/17  7:12 AM  Result Value Ref Range   Glucose-Capillary 105 (H) 65 - 99 mg/dL   Comment 1 Notify RN   Renal function panel     Status: Abnormal   Collection Time: 11/19/17  7:38 AM  Result Value Ref Range   Sodium 139 135 - 145 mmol/L   Potassium 4.1 3.5 - 5.1 mmol/L   Chloride 108 101 - 111 mmol/L   CO2 18 (L) 22 - 32 mmol/L   Glucose, Bld 112 (H) 65 - 99 mg/dL   BUN 80 (H) 6 - 20 mg/dL   Creatinine, Ser 2.68 (H) 0.44 - 1.00 mg/dL   Calcium 8.5 (L) 8.9 - 10.3 mg/dL   Phosphorus 5.2 (H) 2.5 - 4.6 mg/dL   Albumin 2.2 (L) 3.5 - 5.0 g/dL   GFR calc non Af Amer 19 (L) >60 mL/min   GFR calc Af Amer 22 (L) >60 mL/min    Comment: (NOTE) The eGFR has been calculated using the CKD EPI equation. This calculation has not been validated in all clinical situations. eGFR's persistently <60 mL/min signify possible Chronic Kidney Disease.    Anion gap 13 5 - 15    Comment: Performed at [facility] [address], [place], [place] [address]  CBC with Differential/Platelet     Status: Abnormal   Collection Time: 11/19/17  7:38 AM  Result Value Ref Range   WBC 7.3 4.0 - 10.5 K/uL   RBC 4.10 3.87 - 5.11 MIL/uL   Hemoglobin 11.4 (L) 12.0 - 15.0 g/dL   HCT 35.5 (L) 36.0 - 46.0 %   MCV 86.6 78.0 - 100.0 fL   MCH 27.8 26.0 - 34.0 pg   MCHC 32.1 30.0 - 36.0 g/dL   RDW 16.2 (H) 11.5 - 15.5 %   Platelets 430 (H) 150 - 400 K/uL   Neutrophils Relative % 58 %   Neutro Abs 4.2 1.7 - 7.7 K/uL   Lymphocytes Relative 32 %   Lymphs Abs 2.4 0.7 - 4.0 K/uL   Monocytes Relative 4 %   Monocytes Absolute 0.3 0.1 - 1.0  K/uL   Eosinophils Relative 5 %   Eosinophils Absolute 0.3 0.0 - 0.7 K/uL   Basophils Relative 1  %   Basophils Absolute 0.0 0.0 - 0.1 K/uL    Comment: Performed at [facility] [address], [place], [place] [address]  Glucose, capillary     Status: None   Collection Time: 11/19/17 11:38 AM  Result Value Ref Range   Glucose-Capillary 88 65 - 99 mg/dL  Glucose, capillary     Status: Abnormal   Collection Time: 11/19/17  4:56 PM  Result Value Ref Range   Glucose-Capillary 116 (H) 65 - 99 mg/dL  Glucose, capillary     Status: Abnormal   Collection Time: 11/19/17  8:53 PM  Result Value Ref Range   Glucose-Capillary 119 (H) 65 - 99 mg/dL  Basic metabolic panel     Status: Abnormal   Collection Time: 11/20/17  5:48 AM  Result Value Ref Range   Sodium 139 135 - 145 mmol/L   Potassium 4.0 3.5 - 5.1 mmol/L   Chloride 109 101 - 111 mmol/L   CO2 19 (L) 22 - 32 mmol/L   Glucose, Bld 92 65 - 99 mg/dL   BUN 77 (H) 6 - 20 mg/dL   Creatinine, Ser 2.72 (H) 0.44 - 1.00 mg/dL   Calcium 8.5 (L) 8.9 - 10.3 mg/dL   GFR calc non Af Amer 18 (L) >60 mL/min   GFR calc Af Amer 21 (L) >60 mL/min    Comment: (NOTE) The eGFR has been calculated using the CKD EPI equation. This calculation has not been validated in all clinical situations. eGFR's persistently <60 mL/min signify possible Chronic Kidney Disease.    Anion gap 11 5 - 15    Comment: Performed at [facility] [address] [place], [place] [address]  Glucose, capillary     Status: None   Collection Time: 11/20/17  7:00 AM  Result Value Ref Range   Glucose-Capillary 84 65 - 99 mg/dL  Glucose, capillary     Status: None   Collection Time: 11/20/17 11:31 AM  Result Value Ref Range   Glucose-Capillary 80 65 - 99 mg/dL  Glucose, capillary     Status: Abnormal   Collection Time: 11/20/17  4:43 PM  Result Value Ref Range   Glucose-Capillary 113 (H) 65 - 99 mg/dL  Glucose, capillary     Status: Abnormal   Collection Time: 11/20/17  9:26 PM  Result Value Ref Range   Glucose-Capillary 128 (H) 65 - 99 mg/dL  Glucose,  capillary     Status: None   Collection Time: 11/21/17  6:55 AM  Result Value Ref Range   Glucose-Capillary 96 65 - 99 mg/dL  Basic metabolic panel     Status: Abnormal   Collection Time: 11/21/17  7:19 AM  Result Value Ref Range   Sodium 140 135 - 145 mmol/L   Potassium 4.2 3.5 - 5.1 mmol/L   Chloride 109 101 - 111 mmol/L   CO2 19 (L) 22 - 32 mmol/L   Glucose, Bld 104 (H) 65 - 99 mg/dL   BUN 82 (H) 6 - 20 mg/dL   Creatinine, Ser 2.73 (H) 0.44 - 1.00 mg/dL   Calcium 8.4 (L) 8.9 - 10.3 mg/dL   GFR calc non Af Amer 18 (L) >60 mL/min   GFR calc Af Amer 21 (L) >60 mL/min    Comment: (NOTE) The eGFR has been calculated using the CKD EPI equation. This calculation has not  been validated in all clinical situations. eGFR's persistently <60 mL/min signify possible Chronic Kidney Disease.    Anion gap 12 5 - 15    Comment: Performed at [facility] [address], [place], [place] [address]    General: NAD. Vital signs reviewed. HEENT: Normocephalic, atraumatic.  Cardio: RRR without murmur. No JVD . Resp: CTA B/L and unlabored GI: BS positive and ND Musc/Skel:  No edmea or tenderness. Neuro: Alert ans oriented x2 Motor:  LUE: 5/5 proximal to distal RUE: 4+/5 proximal to distal (stable) RLE: HF: 2+/5, ADF/PF 2/5 (stable) LLE: HF 3-/5, ADF/PF 2+/5 (stable) Skin: Intact. Warm and dry.  Psych: a little flat, cooperative. Smiled on occasion. Did not appear anxious   Assessment/Plan: 1. Functional deficits secondary to debility and encephalopathy which require 3+ hours per day of interdisciplinary therapy in a comprehensive inpatient rehab setting. Physiatrist is providing close team supervision and 24 hour management of active medical problems listed below. Physiatrist and rehab team continue to assess barriers to discharge/monitor patient progress toward functional and medical goals. FIM: Function - Bathing Position: Wheelchair/chair at sink Body parts bathed by patient:  Right arm, Left arm, Chest, Abdomen Body parts bathed by helper: Front perineal area, Buttocks, Back Bathing not applicable: Left upper leg, Left lower leg, Right lower leg, Right upper leg Assist Level: 2 helpers  Function- Upper Body Dressing/Undressing What is the patient wearing?: Hospital gown Bra - Perfomed by patient: Thread/unthread left bra strap Bra - Perfomed by helper: Thread/unthread right bra strap, Hook/unhook bra (pull down sports bra), Thread/unthread left bra strap Pull over shirt/dress - Perfomed by patient: Thread/unthread right sleeve, Thread/unthread left sleeve, Put head through opening Pull over shirt/dress - Perfomed by helper: Pull shirt over trunk Assist Level: 2 helpers Function - Lower Body Dressing/Undressing What is the patient wearing?: Hospital Gown Position: Bed Underwear - Performed by helper: Thread/unthread right underwear leg, Thread/unthread left underwear leg Pants- Performed by helper: Thread/unthread right pants leg, Thread/unthread left pants leg, Pull pants up/down Assist for footwear: Maximal assist Assist for lower body dressing: 2 Helpers Set up : To obtain clothing/put away  Function - Toileting Toileting activity did not occur: No continent bowel/bladder event Toileting steps completed by helper: Adjust clothing prior to toileting, Performs perineal hygiene, Adjust clothing after toileting(per [place], NT report) Assist level: Touching or steadying assistance (Pt.75%)(per Haskell Flirt Aquit, NT report)  Function - Air cabin crew transfer activity did not occur: Safety/medical concerns Assist level to toilet: 2 helpers(per [place], NT report)  Function - Chair/bed transfer Chair/bed transfer method: Lateral scoot Chair/bed transfer assist level: Total assist (Pt < 25%) Chair/bed transfer assistive device: Sliding board Chair/bed transfer details: Verbal cues for precautions/safety, Verbal cues for safe use of  DME/AE, Manual facilitation for weight shifting, Manual facilitation for placement  Function - Locomotion: Wheelchair Will patient use wheelchair at discharge?: Yes Type: Manual Max wheelchair distance: 150' Assist Level: Touching or steadying assistance (Pt > 75%) Assist Level: Touching or steadying assistance (Pt > 75%) Wheel 150 feet activity did not occur: Safety/medical concerns Assist Level: Touching or steadying assistance (Pt > 75%) Turns around,maneuvers to table,bed, and toilet,negotiates 3% grade,maneuvers on rugs and over doorsills: No Function - Locomotion: Ambulation Ambulation activity did not occur: Safety/medical concerns Walk 10 feet activity did not occur: Safety/medical concerns Walk 50 feet with 2 turns activity did not occur: Safety/medical concerns Walk 150 feet activity did not occur: Safety/medical concerns Walk 10 feet on uneven surfaces  activity did not occur: Safety/medical concerns  Function - Comprehension Comprehension: Auditory Comprehension assist level: Follows basic conversation/direction with extra time/assistive device  Function - Expression Expression: Verbal Expression assist level: Expresses basic 75 - 89% of the time/requires cueing 10 - 24% of the time. Needs helper to occlude trach/needs to repeat words.  Function - Social Interaction Social Interaction assist level: Interacts appropriately 75 - 89% of the time - Needs redirection for appropriate language or to initiate interaction.  Function - Problem Solving Problem solving assist level: Solves basic 50 - 74% of the time/requires cueing 25 - 49% of the time  Function - Memory Memory assist level: Recognizes or recalls 25 - 49% of the time/requires cueing 50 - 75% of the time Patient normally able to recall (first 3 days only): That he or she is in a hospital  Medical Problem List and Plan: 1.Functional and mobility deficitssecondary to debility and encephalopathy from multiple  medical issues   Cont CIR    repeat MRI brain and T-spine suggesting stable/improvement. Neurology signed off. 2. DVT Prophylaxis/Anticoagulation: Pharmaceutical:Lovenox 3. Pain Management:tylenol prn 4. Mood:LCSW to follow for evaluation and support.   -continue to closely monitor behavior. Pt with episodic anxiety, suicidal ideations have been expressed at times    -doesn't have any thoughts of following through with ideations   -team to continue to provide support    -will have dr. Sima Matas to see pt this week    -xanax prn for anxiety 5. Neuropsych: This patientis not fullycapable of making decisions onherown behalf. 6. Skin/Wound Care:Air mattress for pressure relief measures. Local measures to help managesacral /labialMASD PRAFO's for bilateral LE's 7. Fluids/Electrolytes/Nutrition:Monitor I/O.    Continue to offer supplements to help with protein calorie malnutrition.   Cont Remeron 15 8. C diff colitis:    Vancomycincompleted 2/6 Continue enteric precautions Stools are improving 9. HTN: Monitor BP bid.    Nifedipine bid increased to 60 on 2/6   Imdur increased to 90 on 2/4   Metoprolol increased to 100 BID on 2/3   Hydralazine 10 3 times a day started on 2/13, increased to 25 on 2/14   Remains elevated at times, no changes today given all recent increases Vitals:   11/20/17 2129 11/21/17 0334  BP: (!) 129/110 (!) 157/78  Pulse: 85 85  Resp:  18  Temp:  97.8 F (36.6 C)  SpO2:  95%  10 T2DM with retinopathy, neuropathy, andnephropathy:Monitor BS ac/hs.    SSI for elevated BS CBG (last 3)  Recent Labs    11/20/17 1643 11/20/17 2126 11/21/17 0655  GLUCAP 113* 128* 96    Relatively controlled on 2/17 11. H/o depression: Continue Effexor and Remeron.  12. Neurogenic bladder:Has chronic indwelling catheter for 2+ months.   Foley out, I/O caths, keep strict I/Os.  13.AKI onCKD with history of overload:    Cr  2.68 on 2/15, IVF started by nephrology, increased slightly given diarrhea to 75 cc an hour   Daily Labs    Encourage fluids   Appreciate nephrology recs   Cont to monitor 14. Diffuse CAD: treated medically with Metoprolol, Zetia and Plavix. 15.  ABLA on anemia of chronic disease:    Stool OB now neg   Hb 11.4 on 2/15   Appreciate GI recs, EGD/colonoscopy performed on 2/10 showing a total of 5 duodenal ulcers, which are presumed to be a source of bleeding.  16. Leukocytosis: Resolved 17. Hypoalbuminemia   Cont supplement 18. Hyperkalemia: Resolved   Cont  to monitor 19. Acute lower UTI   UA is equivocal, urine culture with Pseudomonas   Cipro completed 2/11-2/13 20.  GI bleed   Believed to be secondary to duodenal ulcers   Appreciate GI recs, 40 mg Protonix twice daily   H. pylori serology negative   Polyp biopsy on colonoscopy results to be sent patient for GI 21. Leukocytosis   WBCs 7.3 on 2/15  Repeat UA relatively unremarkable for infection, Ucx multi species   Labs ordered for Monday  C diff PCR + but toxin negative.  Patient had more formed stool up until yesterday when she had large projectile amount of diarrhea.  Will resume Flagyl 500 mg p.o. every 8 hours   -continue contact precautions   LOS (Days) 16 A FACE TO FACE EVALUATION WAS PERFORMED  Cammy Sanjurjo T 11/21/2017, 9:05 AM

## 2017-11-21 NOTE — Plan of Care (Signed)
  Not Progressing RH BLADDER ELIMINATION RH STG MANAGE BLADDER WITH ASSISTANCE Description STG Manage Bladder With Assistance mod  11/21/2017 1008 - Not Progressing by Twanna Resh, Renato Gails, RN RH STG MANAGE BLADDER WITH MEDICATION WITH ASSISTANCE Description STG Manage Bladder With Medication With Assistance. mod  11/21/2017 1008 - Not Progressing by Charlita Brian, Renato Gails, RN RH STG MANAGE BLADDER WITH EQUIPMENT WITH ASSISTANCE Description STG Manage Bladder With Equipment With mod Assistance  11/21/2017 1008 - Not Progressing by Kimya Mccahill, Renato Gails, RN  TOTAL assist

## 2017-11-21 NOTE — Progress Notes (Addendum)
MD Coladonato notified of patients urine output compared to intake/IV fluids. MD also notified of persistent edema to legs, vaginal, and sacral area with leg edema to be1-2+. Legs elevated and no additional orders received. Continue with plan of care.

## 2017-11-22 ENCOUNTER — Inpatient Hospital Stay (HOSPITAL_COMMUNITY): Payer: No Typology Code available for payment source

## 2017-11-22 ENCOUNTER — Inpatient Hospital Stay (HOSPITAL_COMMUNITY): Payer: No Typology Code available for payment source | Admitting: Occupational Therapy

## 2017-11-22 ENCOUNTER — Inpatient Hospital Stay (HOSPITAL_COMMUNITY): Payer: No Typology Code available for payment source | Admitting: Physical Therapy

## 2017-11-22 DIAGNOSIS — R0602 Shortness of breath: Secondary | ICD-10-CM

## 2017-11-22 LAB — RENAL FUNCTION PANEL
ANION GAP: 11 (ref 5–15)
Albumin: 2.4 g/dL — ABNORMAL LOW (ref 3.5–5.0)
BUN: 81 mg/dL — ABNORMAL HIGH (ref 6–20)
CHLORIDE: 110 mmol/L (ref 101–111)
CO2: 17 mmol/L — ABNORMAL LOW (ref 22–32)
Calcium: 8.4 mg/dL — ABNORMAL LOW (ref 8.9–10.3)
Creatinine, Ser: 2.86 mg/dL — ABNORMAL HIGH (ref 0.44–1.00)
GFR, EST AFRICAN AMERICAN: 20 mL/min — AB (ref >60)
GFR, EST NON AFRICAN AMERICAN: 17 mL/min — AB (ref >60)
Glucose, Bld: 80 mg/dL (ref 65–99)
POTASSIUM: 4.1 mmol/L (ref 3.5–5.1)
Phosphorus: 5.4 mg/dL — ABNORMAL HIGH (ref 2.5–4.6)
Sodium: 138 mmol/L (ref 135–145)

## 2017-11-22 LAB — CBC WITH DIFFERENTIAL/PLATELET
Basophils Absolute: 0.1 10*3/uL (ref 0.0–0.1)
Basophils Relative: 1 %
Eosinophils Absolute: 0.2 10*3/uL (ref 0.0–0.7)
Eosinophils Relative: 3 %
HCT: 35.6 % — ABNORMAL LOW (ref 36.0–46.0)
HEMOGLOBIN: 11.8 g/dL — AB (ref 12.0–15.0)
LYMPHS ABS: 3.1 10*3/uL (ref 0.7–4.0)
LYMPHS PCT: 35 %
MCH: 29 pg (ref 26.0–34.0)
MCHC: 33.1 g/dL (ref 30.0–36.0)
MCV: 87.5 fL (ref 78.0–100.0)
MONOS PCT: 4 %
Monocytes Absolute: 0.4 10*3/uL (ref 0.1–1.0)
NEUTROS PCT: 57 %
Neutro Abs: 5.1 10*3/uL (ref 1.7–7.7)
Platelets: 414 10*3/uL — ABNORMAL HIGH (ref 150–400)
RBC: 4.07 MIL/uL (ref 3.87–5.11)
RDW: 16.4 % — ABNORMAL HIGH (ref 11.5–15.5)
WBC: 8.9 10*3/uL (ref 4.0–10.5)

## 2017-11-22 LAB — GLUCOSE, CAPILLARY
GLUCOSE-CAPILLARY: 83 mg/dL (ref 65–99)
Glucose-Capillary: 74 mg/dL (ref 65–99)
Glucose-Capillary: 121 mg/dL — ABNORMAL HIGH (ref 65–99)
Glucose-Capillary: 110 mg/dL — ABNORMAL HIGH (ref 65–99)

## 2017-11-22 MED ORDER — FUROSEMIDE 10 MG/ML IJ SOLN
80.0000 mg | Freq: Once | INTRAMUSCULAR | Status: AC
  Administered 2017-11-22: 80 mg via INTRAVENOUS
  Filled 2017-11-22: qty 8

## 2017-11-22 MED ORDER — FUROSEMIDE 10 MG/ML IJ SOLN
40.0000 mg | Freq: Two times a day (BID) | INTRAMUSCULAR | Status: DC
  Administered 2017-11-23 – 2017-11-24 (×3): 40 mg via INTRAVENOUS
  Filled 2017-11-22 (×3): qty 4

## 2017-11-22 MED ORDER — COLLAGENASE 250 UNIT/GM EX OINT
TOPICAL_OINTMENT | Freq: Two times a day (BID) | CUTANEOUS | Status: DC
  Administered 2017-11-22 – 2017-11-25 (×7): via TOPICAL
  Administered 2017-11-26: 1 via TOPICAL
  Administered 2017-11-26: 10:00:00 via TOPICAL
  Administered 2017-11-27: 1 via TOPICAL
  Administered 2017-11-27 – 2017-12-02 (×9): via TOPICAL
  Filled 2017-11-22: qty 30

## 2017-11-22 NOTE — Progress Notes (Signed)
Discussed patient with Dr. Joelyn Oms who recommended dose of IV lasix 80 mg X once for diuresis. Labs ordered for today and daily.

## 2017-11-22 NOTE — Progress Notes (Signed)
Occupational Therapy Session Note  Patient Details  Name: Laura [name] MRN: [id number] Date of Birth: 19-Apr-1961  Today's Date: 11/22/2017 OT Individual Time: 1300-1400 OT Individual Time Calculation (min): 60 min    Short Term Goals: Week 2:  OT Short Term Goal 1 (Week 2): Pt will complete LB dressing with modA OT Short Term Goal 2 (Week 2): Pt will perform shower transfer with mod A  OT Short Term Goal 3 (Week 2): Pt with complete sit <> stand with max assist of one caregiver OT Short Term Goal 4 (Week 2): Pt will complete grooming at sink side with min A   Skilled Therapeutic Interventions/Progress Updates:    Treatment session with focus on sit <> stand.  Pt received upright in w/c with friend present.  Friend requesting pt take a shower during therapy session.  Pt reports breathing is better and motivated to attempt shower, as transfers had been introduced during previous OT session.  Completed sit > stand in [place] with max assist from therapist at hips to boost up into standing.  Engaged in multiple sit > stand in [place] secondary to incontinent BM that had leaked through incontinence brief.  +2 for safety due to decreased activity tolerance with continued sit <> stand.  Max-total assist for standing while 2nd person completed hygiene.  Returned to bed for wound care and to don clean clothes.  Pt able to tolerate sitting EOB to doff and don clean shirt, bed level for LB dressing secondary to fatigue and mild wheezing from increased activity during sit <> stand.  Pt left reclined in bed with RN present.  Therapy Documentation Precautions:  Precautions Precautions: Fall Precaution Comments: monitor blood pressure Restrictions Weight Bearing Restrictions: No General:   Vital Signs: Therapy Vitals Temp: 98.2 F (36.8 C) Temp Source: Oral Pulse Rate: 73 Resp: 18 BP: 111/64 Patient Position (if appropriate): Lying Oxygen Therapy SpO2: 93 % O2 Device: Not Delivered Pain: Pain  Assessment Pain Assessment: No/denies pain  See Function Navigator for Current Functional Status.   Therapy/Group: Individual Therapy  [name] 11/22/2017, 3:44 PM

## 2017-11-22 NOTE — Progress Notes (Signed)
Physical Therapy Session Note  Patient Details  Name: Laura [name] MRN: [id number] Date of Birth: 08/15/1961  Today's Date: 11/22/2017 PT Individual Time: 1000-1030 PT Individual Time Calculation (min): 30 min   Short Term Goals: Week 3:  PT Short Term Goal 1 (Week 3): =LTG  Skilled Therapeutic Interventions/Progress Updates: Pt presented in w/c agreeable to therapy. Session focused on activity tolerance. Pt states feels slightly better than earlier this am. Pt transported to day room for energy conservation. Pt use of UBE, L1 for endurance. Pt able to complete up to 1 min without rest with no significant increase in dyspnea. Per pt feels like "heart is racing" vitals checked HR 84 SpO2 95%. Resolved with seated rest. Pt able to complete 2 more cycles of 1 min each with cues for pacing and no c/o from pt. Pt returned to room at end of session with call bell within reach and needs met.      Therapy Documentation Precautions:  Precautions Precautions: Fall Precaution Comments: monitor blood pressure Restrictions Weight Bearing Restrictions: No General: PT Amount of Missed Time (min): 15 Minutes PT Missed Treatment Reason: Patient ill (Comment) Vital Signs: Therapy Vitals Pulse Rate: 83 BP: (!) 154/92 Pain: Pain Assessment Pain Assessment: No/denies pain  See Function Navigator for Current Functional Status.   Therapy/Group: Individual Therapy  Rosita DeChalus  Rosita DeChalus, PTA  11/22/2017, 12:42 PM  

## 2017-11-22 NOTE — Progress Notes (Addendum)
Physical Therapy Weekly Progress Note and Session Note  Patient Details  Name: Kamilya Wakeman MRN: [id number] Date of Birth: [birth year]    Time: 865-784 45 minutes  1:1 Pt with no c/o pain. Pt with difficulty breathing lying in bed, RN made aware.  Pt rolled with min A to don brief. Pt able to sit edge of bed x 10 minutes with intermittent min A with RN assessing for breathing and vitals.  Pt performs standing with max A with total A to don pants.  Sliding board transfer to w/c with max A. In w/c pt begins audible wheezing, PA made aware and states to try "light exercise" with pt.  Seated in w/c pt performs theraband UE exercise diagonals, bicep curls and shoulder flexion. Pt with increased work of breathing due to exercise. Session ended 15 minutes early and PA and RN aware of pt status.  Beginning of progress report period: November 13, 2017 End of progress report period: November 22, 2017   Patient has met 2 of 3 short term goals.  Pt improving transfers and activity tolerance as well as sitting balance.  Pt continues to fluctuate depending on fatigue and attention to task. Pt limited this week by mood and suicidal ideations limiting participation in therapy.  Pt's goals downgraded to mod A w/c level for d/c.  Patient continues to demonstrate the following deficits muscle weakness, abnormal tone, decreased coordination and decreased motor planning, decreased attention, decreased awareness, decreased problem solving, decreased safety awareness, decreased memory and delayed processing and decreased sitting balance, decreased standing balance, decreased postural control and decreased balance strategies and therefore will continue to benefit from skilled PT intervention to increase functional independence with mobility.  Patient not progressing toward long term goals.  See goal revision..  Continue plan of care.  PT Short Term Goals Week 2:  PT Short Term Goal 1 (Week 2): Pt will perform bed  to/from wheelchair transfer with mod assist PT Short Term Goal 1 - Progress (Week 2): Met PT Short Term Goal 2 (Week 2): Pt will maintain dynamic sitting balance with min assist PT Short Term Goal 2 - Progress (Week 2): Met PT Short Term Goal 3 (Week 2): Pt will perform sit to/from stand transfer with LRAD with max assist consistently PT Short Term Goal 3 - Progress (Week 2): Progressing toward goal Week 3:  PT Short Term Goal 1 (Week 3): =LTG  Skilled Therapeutic Interventions/Progress Updates:  Ambulation/gait training;Cognitive remediation/compensation;Discharge planning;DME/adaptive equipment instruction;Functional mobility training;Pain management;Psychosocial support;Splinting/orthotics;Therapeutic Activities;UE/LE Strength taining/ROM;Visual/perceptual remediation/compensation;Wheelchair propulsion/positioning;UE/LE Coordination activities;Therapeutic Exercise;Stair training;Skin care/wound management;Patient/family education;Neuromuscular re-education;Functional electrical stimulation;Disease management/prevention;Community reintegration;Balance/vestibular training    See Function Navigator for Current Functional Status.  Soniyah Mcglory 11/22/2017, 8:15 AM

## 2017-11-22 NOTE — Progress Notes (Signed)
Subjective/Complaints: Pt seen lying in bed this AM.  She slept well overnight.  She states she had a good weekend. Later notified of dyspnea.    ROS: Denies CP, SOB, N/V/D.  Objective: Vital Signs: Blood pressure (!) 160/90, pulse 85, temperature (!) 97.5 F (36.4 C), temperature source Oral, resp. rate 18, height 4' 11"  (1.499 m), weight 57.8 kg (127 lb 6.8 oz), SpO2 98 %. Dg Chest Port 1 View  Result Date: 11/22/2017 CLINICAL DATA:  Shortness of breath and lower extremity swelling. EXAM: PORTABLE CHEST 1 VIEW COMPARISON:  PA and lateral chest 10/30/2017. Single-view of the chest 08/24/2017. FINDINGS: There is cardiomegaly with pulmonary edema and small to moderate pleural effusions, larger on the left. Aeration is worse than on the most recent examination. No pneumothorax. Lung volumes are low. IMPRESSION: Pulmonary edema with small to moderate pleural effusions, larger on the left. Cardiomegaly noted. Electronically Signed   By: Inge Rise M.D.   On: 11/22/2017 09:28   Results for orders placed or performed during the hospital encounter of 11/05/17 (from the past 72 hour(s))  Glucose, capillary     Status: None   Collection Time: 11/19/17 11:38 AM  Result Value Ref Range   Glucose-Capillary 88 65 - 99 mg/dL  Glucose, capillary     Status: Abnormal   Collection Time: 11/19/17  4:56 PM  Result Value Ref Range   Glucose-Capillary 116 (H) 65 - 99 mg/dL  Glucose, capillary     Status: Abnormal   Collection Time: 11/19/17  8:53 PM  Result Value Ref Range   Glucose-Capillary 119 (H) 65 - 99 mg/dL  Basic metabolic panel     Status: Abnormal   Collection Time: 11/20/17  5:48 AM  Result Value Ref Range   Sodium 139 135 - 145 mmol/L   Potassium 4.0 3.5 - 5.1 mmol/L   Chloride 109 101 - 111 mmol/L   CO2 19 (L) 22 - 32 mmol/L   Glucose, Bld 92 65 - 99 mg/dL   BUN 77 (H) 6 - 20 mg/dL   Creatinine, Ser 2.72 (H) 0.44 - 1.00 mg/dL   Calcium 8.5 (L) 8.9 - 10.3 mg/dL   GFR calc non Af  Amer 18 (L) >60 mL/min   GFR calc Af Amer 21 (L) >60 mL/min    Comment: (NOTE) The eGFR has been calculated using the CKD EPI equation. This calculation has not been validated in all clinical situations. eGFR's persistently <60 mL/min signify possible Chronic Kidney Disease.    Anion gap 11 5 - 15    Comment: Performed at [facility] [address], [place], [place] [address]  Glucose, capillary     Status: None   Collection Time: 11/20/17  7:00 AM  Result Value Ref Range   Glucose-Capillary 84 65 - 99 mg/dL  Glucose, capillary     Status: None   Collection Time: 11/20/17 11:31 AM  Result Value Ref Range   Glucose-Capillary 80 65 - 99 mg/dL  Glucose, capillary     Status: Abnormal   Collection Time: 11/20/17  4:43 PM  Result Value Ref Range   Glucose-Capillary 113 (H) 65 - 99 mg/dL  Glucose, capillary     Status: Abnormal   Collection Time: 11/20/17  9:26 PM  Result Value Ref Range   Glucose-Capillary 128 (H) 65 - 99 mg/dL  Glucose, capillary     Status: None   Collection Time: 11/21/17  6:55 AM  Result Value Ref Range   Glucose-Capillary 96 65 -  99 mg/dL  Basic metabolic panel     Status: Abnormal   Collection Time: 11/21/17  7:19 AM  Result Value Ref Range   Sodium 140 135 - 145 mmol/L   Potassium 4.2 3.5 - 5.1 mmol/L   Chloride 109 101 - 111 mmol/L   CO2 19 (L) 22 - 32 mmol/L   Glucose, Bld 104 (H) 65 - 99 mg/dL   BUN 82 (H) 6 - 20 mg/dL   Creatinine, Ser 2.73 (H) 0.44 - 1.00 mg/dL   Calcium 8.4 (L) 8.9 - 10.3 mg/dL   GFR calc non Af Amer 18 (L) >60 mL/min   GFR calc Af Amer 21 (L) >60 mL/min    Comment: (NOTE) The eGFR has been calculated using the CKD EPI equation. This calculation has not been validated in all clinical situations. eGFR's persistently <60 mL/min signify possible Chronic Kidney Disease.    Anion gap 12 5 - 15    Comment: Performed at [facility] [address], [place], [place] [address]  Glucose, capillary     Status: None    Collection Time: 11/21/17 11:28 AM  Result Value Ref Range   Glucose-Capillary 93 65 - 99 mg/dL  Glucose, capillary     Status: None   Collection Time: 11/21/17  4:42 PM  Result Value Ref Range   Glucose-Capillary 92 65 - 99 mg/dL  Glucose, capillary     Status: Abnormal   Collection Time: 11/21/17  8:29 PM  Result Value Ref Range   Glucose-Capillary 101 (H) 65 - 99 mg/dL  Glucose, capillary     Status: None   Collection Time: 11/22/17  6:33 AM  Result Value Ref Range   Glucose-Capillary 74 65 - 99 mg/dL    General: NAD. Vital signs reviewed. HEENT: Normocephalic, atraumatic.  Cardio: RRR. No JVD. Resp: CTA B/L and unlabored GI: BS positive and ND Musc/Skel:  No edmea or tenderness. Neuro: Alert and oriented x2 Motor:  LUE: 5/5 proximal to distal RUE: 4+/5 proximal to distal (stable) RLE: HF: 2+/5, ADF/PF 2/5 (unchanged) LLE: HF 3-/5, ADF/PF 2+/5 (unchanged) Skin: Intact. Warm and dry.  Psych: Flat, cooperative.   Assessment/Plan: 1. Functional deficits secondary to debility and encephalopathy which require 3+ hours per day of interdisciplinary therapy in a comprehensive inpatient rehab setting. Physiatrist is providing close team supervision and 24 hour management of active medical problems listed below. Physiatrist and rehab team continue to assess barriers to discharge/monitor patient progress toward functional and medical goals. FIM: Function - Bathing Position: Wheelchair/chair at sink Body parts bathed by patient: Right arm, Left arm, Chest, Abdomen Body parts bathed by helper: Front perineal area, Buttocks, Back Bathing not applicable: Left upper leg, Left lower leg, Right lower leg, Right upper leg Assist Level: 2 helpers  Function- Upper Body Dressing/Undressing What is the patient wearing?: Hospital gown Bra - Perfomed by patient: Thread/unthread left bra strap Bra - Perfomed by helper: Thread/unthread right bra strap, Hook/unhook bra (pull down sports bra),  Thread/unthread left bra strap Pull over shirt/dress - Perfomed by patient: Thread/unthread right sleeve, Thread/unthread left sleeve, Put head through opening Pull over shirt/dress - Perfomed by helper: Pull shirt over trunk Assist Level: 2 helpers Function - Lower Body Dressing/Undressing What is the patient wearing?: Hospital Gown Position: Bed Underwear - Performed by helper: Thread/unthread right underwear leg, Thread/unthread left underwear leg Pants- Performed by helper: Thread/unthread right pants leg, Thread/unthread left pants leg, Pull pants up/down Assist for footwear: Maximal assist Assist for lower body  dressing: 2 Helpers Set up : To obtain clothing/put away  Function - Toileting Toileting activity did not occur: No continent bowel/bladder event Toileting steps completed by helper: Adjust clothing prior to toileting, Performs perineal hygiene, Adjust clothing after toileting(per Bandon, NT report) Assist level: Touching or steadying assistance (Pt.75%)(per Haskell Flirt Aquit, NT report)  Function - Air cabin crew transfer activity did not occur: Safety/medical concerns Assist level to toilet: 2 helpers(per [place], NT report)  Function - Chair/bed transfer Chair/bed transfer method: Lateral scoot Chair/bed transfer assist level: Total assist (Pt < 25%) Chair/bed transfer assistive device: Sliding board Chair/bed transfer details: Verbal cues for precautions/safety, Verbal cues for safe use of DME/AE, Manual facilitation for weight shifting, Manual facilitation for placement  Function - Locomotion: Wheelchair Will patient use wheelchair at discharge?: Yes Type: Manual Max wheelchair distance: 150' Assist Level: Touching or steadying assistance (Pt > 75%) Assist Level: Touching or steadying assistance (Pt > 75%) Wheel 150 feet activity did not occur: Safety/medical concerns Assist Level: Touching or steadying assistance (Pt > 75%) Turns  around,maneuvers to table,bed, and toilet,negotiates 3% grade,maneuvers on rugs and over doorsills: No Function - Locomotion: Ambulation Ambulation activity did not occur: Safety/medical concerns Walk 10 feet activity did not occur: Safety/medical concerns Walk 50 feet with 2 turns activity did not occur: Safety/medical concerns Walk 150 feet activity did not occur: Safety/medical concerns Walk 10 feet on uneven surfaces activity did not occur: Safety/medical concerns  Function - Comprehension Comprehension: Auditory Comprehension assist level: Follows basic conversation/direction with extra time/assistive device  Function - Expression Expression: Verbal Expression assist level: Expresses basic 75 - 89% of the time/requires cueing 10 - 24% of the time. Needs helper to occlude trach/needs to repeat words.  Function - Social Interaction Social Interaction assist level: Interacts appropriately 75 - 89% of the time - Needs redirection for appropriate language or to initiate interaction.  Function - Problem Solving Problem solving assist level: Solves basic 50 - 74% of the time/requires cueing 25 - 49% of the time  Function - Memory Memory assist level: Recognizes or recalls 25 - 49% of the time/requires cueing 50 - 75% of the time Patient normally able to recall (first 3 days only): That he or she is in a hospital  Medical Problem List and Plan: 1.Functional and mobility deficitssecondary to debility and encephalopathy from multiple medical issues   Cont CIR    repeat MRI brain and T-spine suggesting stable/improvement. Neurology signed off. 2. DVT Prophylaxis/Anticoagulation: Pharmaceutical:Lovenox 3. Pain Management:tylenol prn 4. Mood:LCSW to follow for evaluation and support.   team to continue to provide support    xanax prn for anxiety 5. Neuropsych: This patientis not fullycapable of making decisions onherown behalf. 6. Skin/Wound Care:Air mattress for pressure  relief measures. Local measures to help managesacral /labialMASD PRAFO's for bilateral LE's 7. Fluids/Electrolytes/Nutrition:Monitor I/O.    Continue to offer supplements to help with protein calorie malnutrition.   Cont Remeron 15 8. C diff colitis:    Vancomycincompleted 2/6 Continue enteric precautions Stools are improving 9. HTN: Monitor BP bid.    Nifedipine bid increased to 60 on 2/6   Imdur increased to 90 on 2/4   Metoprolol increased to 100 BID on 2/3   Hydralazine 10 3 times a day started on 2/13, increased to 25 on 2/14   Remains elevated on 2/18 Vitals:   11/21/17 2052 11/22/17 0235  BP: (!) 168/97 (!) 160/90  Pulse: 82 85  Resp:  18  Temp:  (!)  97.5 F (36.4 C)  SpO2:  98%  10 T2DM with retinopathy, neuropathy, andnephropathy:Monitor BS ac/hs.    SSI for elevated BS CBG (last 3)  Recent Labs    11/21/17 1642 11/21/17 2029 11/22/17 0633  GLUCAP 92 101* 74    Relatively controlled on 2/18 11. H/o depression: Continue Effexor and Remeron.  12. Neurogenic bladder:Has chronic indwelling catheter for 2+ months.   Foley out, I/O caths, keep strict I/Os.  13.AKI onCKD with history of overload:    Cr 2.73 on 2/18, IVF started by nephrology, d/ced due to fluid overload, will speak with Nephro   Daily Labs    Encourage fluids   Appreciate nephrology recs   Cont to monitor 14. Diffuse CAD: treated medically with Metoprolol, Zetia and Plavix. 15.  ABLA on anemia of chronic disease:    Stool OB now neg   Hb 11.4 on 2/15   Appreciate GI recs, EGD/colonoscopy performed on 2/10 showing a total of 5 duodenal ulcers, which are presumed to be a source of bleeding.  16. Leukocytosis: Resolved 17. Hypoalbuminemia   Cont supplement 18. Hyperkalemia: Resolved   Cont to monitor 19. Acute lower UTI   UA is equivocal, urine culture with Pseudomonas   Cipro completed 2/11-2/13 20.  GI bleed   Believed to be secondary to duodenal  ulcers   Appreciate GI recs, 40 mg Protonix twice daily    H. pylori serology negative   Polyp biopsy on colonoscopy results to be sent patient for GI 21. Leukocytosis   WBCs 7.3 on 2/15  Repeat UA relatively unremarkable for infection, Ucx multi species   Labs pending  C diff PCR + but toxin negative.   22. Dyspnea  CXR ordered  Will wpeak with Neprho     LOS (Days) 17 A FACE TO FACE EVALUATION WAS PERFORMED  Jaevian Shean Lorie Phenix 11/22/2017, 9:42 AM

## 2017-11-22 NOTE — Progress Notes (Signed)
Speech Language Pathology Daily Session Note  Patient Details  Name: Laura [name] MRN: [id number] Date of Birth: Apr 25, 1961  Today's Date: 11/22/2017 SLP Individual Time: 1100-1157 SLP Individual Time Calculation (min): 57 min  Short Term Goals: Week 3: SLP Short Term Goal 1 (Week 3): Pt will utilize external memory aides to recall new dialy information with Mod A cues.  SLP Short Term Goal 2 (Week 3): Pt will sustain attention to basic familiar task for ~ 20 minutes with Min A cues.  SLP Short Term Goal 3 (Week 3): Pt will complete basic familiar problem solving task related to ADL with Min A cues.  SLP Short Term Goal 4 (Week 3): Pt will increase vocal intensity to achieve intelligibility in mildly noisy environments with mod verbal cues.    Skilled Therapeutic Interventions: Skilled ST services focused on cognitive skills. SLP facilitated delayed recall of novel information following listening to simple paragraphs in a distracting environment, pt required min A verbal cues for recall with 5 minute delay. Pt demonstrated sustained attention during recall tasks and conversation with Min A verbal cues for redirection in 30 minute intervals. SLP facilitated functional problem solving with lunch time tray, pt required Min A verbal cues to set up tray, locate items on tray and put condiments on sandwich. Pt required Mod A verbal cues to slow rate of speech and over articulate words in conversation in order to increase speech intelligibility. Pt was left in room with visitor and call bell within reach. Recommend to continue skilled ST services.     Function:  Eating Eating   Modified Consistency Diet: Yes Eating Assist Level: More than reasonable amount of time;Set up assist for   Eating Set Up Assist For: Opening containers       Cognition Comprehension Comprehension assist level: Follows basic conversation/direction with extra time/assistive device  Expression   Expression assist  level: Expresses basic 75 - 89% of the time/requires cueing 10 - 24% of the time. Needs helper to occlude trach/needs to repeat words.  Social Interaction Social Interaction assist level: Interacts appropriately 75 - 89% of the time - Needs redirection for appropriate language or to initiate interaction.  Problem Solving Problem solving assist level: Solves basic 75 - 89% of the time/requires cueing 10 - 24% of the time  Memory Memory assist level: Recognizes or recalls 75 - 89% of the time/requires cueing 10 - 24% of the time    Pain Pain Assessment Pain Assessment: No/denies pain  Therapy/Group: Individual Therapy  Zakry Caso  [facility] 11/22/2017, 12:20 PM

## 2017-11-22 NOTE — Progress Notes (Signed)
S:  Having dyspnea with exertion Lying flat in bed on RA on my exam with nl resp effort Rec 80 IV lasix today Labs soverall stable   O:BP 111/64 (BP Location: Left Arm)   Pulse 73   Temp 98.2 F (36.8 C) (Oral)   Resp 18   Ht 4' 11"  (1.499 m)   Wt 57.8 kg (127 lb 6.8 oz)   SpO2 93%   BMI 25.74 kg/m   Intake/Output Summary (Last 24 hours) at 11/22/2017 1652 Last data filed at 11/22/2017 1420 Gross per 24 hour  Intake 1540 ml  Output 750 ml  Net 790 ml   Intake/Output: I/O last 3 completed shifts: In: 1180 [P.O.:280; I.V.:900] Out: 800 [Urine:800]  Intake/Output this shift:  Total I/O In: 480 [P.O.:480] Out: 400 [Urine:400] Weight change: -1.168 kg (-9.2 oz) Gen: NAD CVS: no rub Resp: cta Abd: benign Ext: 3+ edema b/l  Recent Labs  Lab 11/16/17 0535 11/17/17 0458 11/18/17 1110 11/19/17 0738 11/20/17 0548 11/21/17 0719 11/22/17 0943  NA 143 142 141 139 139 140 138  K 4.1 4.3 4.1 4.1 4.0 4.2 4.1  CL 110 110 111 108 109 109 110  CO2 20* 20* 19* 18* 19* 19* 17*  GLUCOSE 128* 126* 129* 112* 92 104* 80  BUN 75* 80* 78* 80* 77* 82* 81*  CREATININE 2.69* 2.60* 2.63* 2.68* 2.72* 2.73* 2.86*  ALBUMIN 2.3*  --  2.4* 2.2*  --   --  2.4*  CALCIUM 8.8* 8.8* 8.7* 8.5* 8.5* 8.4* 8.4*  PHOS 5.4*  --  5.0* 5.2*  --   --  5.4*   Liver Function Tests: Recent Labs  Lab 11/18/17 1110 11/19/17 0738 11/22/17 0943  ALBUMIN 2.4* 2.2* 2.4*   No results for input(s): LIPASE, AMYLASE in the last 168 hours. No results for input(s): AMMONIA in the last 168 hours. CBC: Recent Labs  Lab 11/17/17 0458 11/19/17 0738 11/22/17 0943  WBC 10.9* 7.3 8.9  NEUTROABS 6.7 4.2 5.1  HGB 12.0 11.4* 11.8*  HCT 36.5 35.5* 35.6*  MCV 86.3 86.6 87.5  PLT 467* 430* 414*   Cardiac Enzymes: No results for input(s): CKTOTAL, CKMB, CKMBINDEX, TROPONINI in the last 168 hours. CBG: Recent Labs  Lab 11/21/17 1128 11/21/17 1642 11/21/17 2029 11/22/17 0633 11/22/17 1215  GLUCAP 93 92 101*  74 83    Iron Studies: No results for input(s): IRON, TIBC, TRANSFERRIN, FERRITIN in the last 72 hours. Studies/Results: Dg Chest Port 1 View  Result Date: 11/22/2017 CLINICAL DATA:  Shortness of breath and lower extremity swelling. EXAM: PORTABLE CHEST 1 VIEW COMPARISON:  PA and lateral chest 10/30/2017. Single-view of the chest 08/24/2017. FINDINGS: There is cardiomegaly with pulmonary edema and small to moderate pleural effusions, larger on the left. Aeration is worse than on the most recent examination. No pneumothorax. Lung volumes are low. IMPRESSION: Pulmonary edema with small to moderate pleural effusions, larger on the left. Cardiomegaly noted. Electronically Signed   By: Inge Rise M.D.   On: 11/22/2017 09:28   . collagenase   Topical BID  . enoxaparin (LOVENOX) injection  30 mg Subcutaneous Q24H  . ezetimibe  10 mg Oral Daily  . feeding supplement (NEPRO CARB STEADY)  237 mL Oral TID WC  . feeding supplement (PRO-STAT SUGAR FREE 64)  30 mL Oral BID  . [START ON 11/23/2017] furosemide  40 mg Intravenous Q12H  . glycerin (Pediatric)  1 suppository Rectal QPC supper  . hydrALAZINE  25 mg Oral Q8H  . insulin  aspart  0-9 Units Subcutaneous TID WC  . isosorbide mononitrate  90 mg Oral Daily  . metoprolol tartrate  100 mg Oral BID  . mirtazapine  15 mg Oral QHS  . multivitamin  1 tablet Oral QHS  . NIFEdipine  60 mg Oral BID  . pantoprazole  40 mg Oral BID AC  . patiromer  8.4 g Oral Daily  . sodium bicarbonate  650 mg Oral BID  . venlafaxine XR  150 mg Oral Q breakfast    BMET    Component Value Date/Time   NA 138 11/22/2017 0943   K 4.1 11/22/2017 0943   CL 110 11/22/2017 0943   CO2 17 (L) 11/22/2017 0943   GLUCOSE 80 11/22/2017 0943   BUN 81 (H) 11/22/2017 0943   CREATININE 2.86 (H) 11/22/2017 0943   CALCIUM 8.4 (L) 11/22/2017 0943   GFRNONAA 17 (L) 11/22/2017 0943   GFRAA 20 (L) 11/22/2017 0943   CBC    Component Value Date/Time   WBC 8.9 11/22/2017 0943    RBC 4.07 11/22/2017 0943   HGB 11.8 (L) 11/22/2017 0943   HCT 35.6 (L) 11/22/2017 0943   PLT 414 (H) 11/22/2017 0943   MCV 87.5 11/22/2017 0943   MCH 29.0 11/22/2017 0943   MCHC 33.1 11/22/2017 0943   RDW 16.4 (H) 11/22/2017 0943   LYMPHSABS 3.1 11/22/2017 0943   MONOABS 0.4 11/22/2017 0943   EOSABS 0.2 11/22/2017 0943   BASOSABS 0.1 11/22/2017 0943      Assessment/Plan:  1. AKI/CKD stage 4- likley baseline diabetic nephropathy and worsening with acute health issues.  Labs oerall stable today.  2. ABLA due to GI Bleed and anemia of CKD stage 4- s/p transfusion 3. Normal AG Met Acidosis on NaHCO3 650 BID 4. Hyperkalemia on daily veltassa K in 4s --  5. CDI - s/p completion of po vancomycin for recent C Diff 6. HTN- stable 7. Severe deconditioning/debilitation- cont with PT/OT with inpatient rehab.  Would cont daily lasix 40 PO BID.  Might ned to hold veltassa. Daily RFP.    Pearson Grippe MD [phone]

## 2017-11-22 NOTE — Progress Notes (Signed)
Social Work Patient ID: Laura [name], female   DOB: Feb 11, 1961, 57 y.o.   MRN: [id number]   Have left another VM for spouse asking that he contact me to provide confirmed d/c plans and caregiver information in order to set up education ASAP.    Brelan Hannen, LCSW

## 2017-11-23 ENCOUNTER — Inpatient Hospital Stay (HOSPITAL_COMMUNITY): Payer: No Typology Code available for payment source | Admitting: Physical Therapy

## 2017-11-23 ENCOUNTER — Inpatient Hospital Stay (HOSPITAL_COMMUNITY): Payer: No Typology Code available for payment source | Admitting: Speech Pathology

## 2017-11-23 ENCOUNTER — Encounter (HOSPITAL_COMMUNITY): Payer: No Typology Code available for payment source | Admitting: Psychology

## 2017-11-23 ENCOUNTER — Inpatient Hospital Stay (HOSPITAL_COMMUNITY): Payer: No Typology Code available for payment source | Admitting: Occupational Therapy

## 2017-11-23 DIAGNOSIS — R197 Diarrhea, unspecified: Secondary | ICD-10-CM

## 2017-11-23 LAB — GLUCOSE, CAPILLARY
GLUCOSE-CAPILLARY: 99 mg/dL (ref 65–99)
Glucose-Capillary: 85 mg/dL (ref 65–99)
Glucose-Capillary: 81 mg/dL (ref 65–99)
Glucose-Capillary: 75 mg/dL (ref 65–99)

## 2017-11-23 LAB — RENAL FUNCTION PANEL
Albumin: 2.2 g/dL — ABNORMAL LOW (ref 3.5–5.0)
Anion gap: 10 (ref 5–15)
BUN: 83 mg/dL — ABNORMAL HIGH (ref 6–20)
CALCIUM: 8.5 mg/dL — AB (ref 8.9–10.3)
CO2: 19 mmol/L — ABNORMAL LOW (ref 22–32)
CREATININE: 2.82 mg/dL — AB (ref 0.44–1.00)
Chloride: 110 mmol/L (ref 101–111)
GFR calc Af Amer: 20 mL/min — ABNORMAL LOW (ref >60)
GFR, EST NON AFRICAN AMERICAN: 18 mL/min — AB (ref >60)
Glucose, Bld: 83 mg/dL (ref 65–99)
Phosphorus: 5.6 mg/dL — ABNORMAL HIGH (ref 2.5–4.6)
Potassium: 4.1 mmol/L (ref 3.5–5.1)
SODIUM: 139 mmol/L (ref 135–145)

## 2017-11-23 MED ORDER — BISACODYL 10 MG RE SUPP: 10.0000 mg | Freq: Every day | RECTAL | Status: DC | PRN

## 2017-11-23 MED ORDER — FLEET ENEMA 7-19 GM/118ML RE ENEM: 1.0000 | ENEMA | Freq: Every day | RECTAL | Status: DC | PRN

## 2017-11-23 MED ORDER — PATIROMER SORBITEX CALCIUM 8.4 G PO PACK: 8.4000 g | PACK | Freq: Every day | ORAL | Status: DC

## 2017-11-23 MED ORDER — CALCIUM POLYCARBOPHIL 625 MG PO TABS
1250.0000 mg | ORAL_TABLET | Freq: Two times a day (BID) | ORAL | Status: DC
  Administered 2017-11-23 – 2017-12-02 (×19): 1250 mg via ORAL
  Filled 2017-11-23 (×19): qty 2

## 2017-11-23 NOTE — Progress Notes (Signed)
Discussed patient with Dr. Baxter Flattery. She felt that patient could be developing spores and have a relapse as risk highest at 2-4 weeks but patient did get suppository and has been on Valtessa which could also be a cause of symptoms. She recommended monitoring for 24 hours off these medications--if symptoms resolve and patient asymptomatic no need to treat. If symptoms recur to treat with oral vancomycin. She agreed with recheck CBC in am and add fiber to see if this helps with symptoms.

## 2017-11-23 NOTE — Progress Notes (Signed)
Subjective/Complaints: Patient seen lying in bed this morning. She states she slept well overnight. Discussed with nursing regarding explosive bowel movement yesterday, did not appear to be C. Difficile like.  ROS: Limited due to cognition  Objective: Vital Signs: Blood pressure (!) 151/71, pulse 80, temperature 97.6 F (36.4 C), temperature source Oral, resp. rate 20, height 4' 11"  (1.499 m), weight 57.8 kg (127 lb 6.8 oz), SpO2 94 %. Dg Chest Port 1 View  Result Date: 11/22/2017 CLINICAL DATA:  Shortness of breath and lower extremity swelling. EXAM: PORTABLE CHEST 1 VIEW COMPARISON:  PA and lateral chest 10/30/2017. Single-view of the chest 08/24/2017. FINDINGS: There is cardiomegaly with pulmonary edema and small to moderate pleural effusions, larger on the left. Aeration is worse than on the most recent examination. No pneumothorax. Lung volumes are low. IMPRESSION: Pulmonary edema with small to moderate pleural effusions, larger on the left. Cardiomegaly noted. Electronically Signed   By: Inge Rise M.D.   On: 11/22/2017 09:28   Results for orders placed or performed during the hospital encounter of 11/05/17 (from the past 72 hour(s))  Glucose, capillary     Status: None   Collection Time: 11/20/17 11:31 AM  Result Value Ref Range   Glucose-Capillary 80 65 - 99 mg/dL  Glucose, capillary     Status: Abnormal   Collection Time: 11/20/17  4:43 PM  Result Value Ref Range   Glucose-Capillary 113 (H) 65 - 99 mg/dL  Glucose, capillary     Status: Abnormal   Collection Time: 11/20/17  9:26 PM  Result Value Ref Range   Glucose-Capillary 128 (H) 65 - 99 mg/dL  Glucose, capillary     Status: None   Collection Time: 11/21/17  6:55 AM  Result Value Ref Range   Glucose-Capillary 96 65 - 99 mg/dL  Basic metabolic panel     Status: Abnormal   Collection Time: 11/21/17  7:19 AM  Result Value Ref Range   Sodium 140 135 - 145 mmol/L   Potassium 4.2 3.5 - 5.1 mmol/L   Chloride 109 101 -  111 mmol/L   CO2 19 (L) 22 - 32 mmol/L   Glucose, Bld 104 (H) 65 - 99 mg/dL   BUN 82 (H) 6 - 20 mg/dL   Creatinine, Ser 2.73 (H) 0.44 - 1.00 mg/dL   Calcium 8.4 (L) 8.9 - 10.3 mg/dL   GFR calc non Af Amer 18 (L) >60 mL/min   GFR calc Af Amer 21 (L) >60 mL/min    Comment: (NOTE) The eGFR has been calculated using the CKD EPI equation. This calculation has not been validated in all clinical situations. eGFR's persistently <60 mL/min signify possible Chronic Kidney Disease.    Anion gap 12 5 - 15    Comment: Performed at [facility] [address] [place], [place] [address]  Glucose, capillary     Status: None   Collection Time: 11/21/17 11:28 AM  Result Value Ref Range   Glucose-Capillary 93 65 - 99 mg/dL  Glucose, capillary     Status: None   Collection Time: 11/21/17  4:42 PM  Result Value Ref Range   Glucose-Capillary 92 65 - 99 mg/dL  Glucose, capillary     Status: Abnormal   Collection Time: 11/21/17  8:29 PM  Result Value Ref Range   Glucose-Capillary 101 (H) 65 - 99 mg/dL  Glucose, capillary     Status: None   Collection Time: 11/22/17  6:33 AM  Result Value Ref Range   Glucose-Capillary  74 65 - 99 mg/dL  CBC with Differential/Platelet     Status: Abnormal   Collection Time: 11/22/17  9:43 AM  Result Value Ref Range   WBC 8.9 4.0 - 10.5 K/uL   RBC 4.07 3.87 - 5.11 MIL/uL   Hemoglobin 11.8 (L) 12.0 - 15.0 g/dL   HCT 35.6 (L) 36.0 - 46.0 %   MCV 87.5 78.0 - 100.0 fL   MCH 29.0 26.0 - 34.0 pg   MCHC 33.1 30.0 - 36.0 g/dL   RDW 16.4 (H) 11.5 - 15.5 %   Platelets 414 (H) 150 - 400 K/uL   Neutrophils Relative % 57 %   Neutro Abs 5.1 1.7 - 7.7 K/uL   Lymphocytes Relative 35 %   Lymphs Abs 3.1 0.7 - 4.0 K/uL   Monocytes Relative 4 %   Monocytes Absolute 0.4 0.1 - 1.0 K/uL   Eosinophils Relative 3 %   Eosinophils Absolute 0.2 0.0 - 0.7 K/uL   Basophils Relative 1 %   Basophils Absolute 0.1 0.0 - 0.1 K/uL    Comment: Performed at [facility], [address] [place], [place] [address]  Renal function panel     Status: Abnormal   Collection Time: 11/22/17  9:43 AM  Result Value Ref Range   Sodium 138 135 - 145 mmol/L   Potassium 4.1 3.5 - 5.1 mmol/L   Chloride 110 101 - 111 mmol/L   CO2 17 (L) 22 - 32 mmol/L   Glucose, Bld 80 65 - 99 mg/dL   BUN 81 (H) 6 - 20 mg/dL   Creatinine, Ser 2.86 (H) 0.44 - 1.00 mg/dL   Calcium 8.4 (L) 8.9 - 10.3 mg/dL   Phosphorus 5.4 (H) 2.5 - 4.6 mg/dL   Albumin 2.4 (L) 3.5 - 5.0 g/dL   GFR calc non Af Amer 17 (L) >60 mL/min   GFR calc Af Amer 20 (L) >60 mL/min    Comment: (NOTE) The eGFR has been calculated using the CKD EPI equation. This calculation has not been validated in all clinical situations. eGFR's persistently <60 mL/min signify possible Chronic Kidney Disease.    Anion gap 11 5 - 15    Comment: Performed at [facility] [address], [place], [place] [address]  Glucose, capillary     Status: None   Collection Time: 11/22/17 12:15 PM  Result Value Ref Range   Glucose-Capillary 83 65 - 99 mg/dL  Glucose, capillary     Status: Abnormal   Collection Time: 11/22/17  5:33 PM  Result Value Ref Range   Glucose-Capillary 121 (H) 65 - 99 mg/dL  Glucose, capillary     Status: Abnormal   Collection Time: 11/22/17  8:59 PM  Result Value Ref Range   Glucose-Capillary 110 (H) 65 - 99 mg/dL  Renal function panel     Status: Abnormal   Collection Time: 11/23/17  4:51 AM  Result Value Ref Range   Sodium 139 135 - 145 mmol/L   Potassium 4.1 3.5 - 5.1 mmol/L   Chloride 110 101 - 111 mmol/L   CO2 19 (L) 22 - 32 mmol/L   Glucose, Bld 83 65 - 99 mg/dL   BUN 83 (H) 6 - 20 mg/dL   Creatinine, Ser 2.82 (H) 0.44 - 1.00 mg/dL   Calcium 8.5 (L) 8.9 - 10.3 mg/dL   Phosphorus 5.6 (H) 2.5 - 4.6 mg/dL   Albumin 2.2 (L) 3.5 - 5.0 g/dL   GFR calc non Af Amer 18 (  L) >60 mL/min   GFR calc Af Amer 20 (L) >60 mL/min    Comment: (NOTE) The eGFR has been calculated using the CKD EPI equation. This  calculation has not been validated in all clinical situations. eGFR's persistently <60 mL/min signify possible Chronic Kidney Disease.    Anion gap 10 5 - 15    Comment: Performed at [facility] [address], [place], [place] [address]  Glucose, capillary     Status: None   Collection Time: 11/23/17  6:27 AM  Result Value Ref Range   Glucose-Capillary 85 65 - 99 mg/dL    General: NAD. Vital signs reviewed. HEENT: Normocephalic, atraumatic.  Cardio: RRR. No JVD. Resp: CTA B/L and Unlabored GI: BS positive and ND Musc/Skel:  No edmea or tenderness. Neuro: Alert and oriented x3 with cues Motor:  LUE: 5/5 proximal to distal RUE: 4+/5 proximal to distal (stable) RLE: HF: 2+/5, ADF/PF 2/5 (improving) LLE: HF 3-/5, ADF/PF 2+/5 (improving) Skin: Intact. Warm and dry.  Psych: Flat, cooperative.   Assessment/Plan: 1. Functional deficits secondary to debility and encephalopathy which require 3+ hours per day of interdisciplinary therapy in a comprehensive inpatient rehab setting. Physiatrist is providing close team supervision and 24 hour management of active medical problems listed below. Physiatrist and rehab team continue to assess barriers to discharge/monitor patient progress toward functional and medical goals. FIM: Function - Bathing Position: Wheelchair/chair at sink Body parts bathed by patient: Right arm, Left arm, Chest, Abdomen Body parts bathed by helper: Front perineal area, Buttocks, Back Bathing not applicable: Left upper leg, Left lower leg, Right lower leg, Right upper leg Assist Level: 2 helpers  Function- Upper Body Dressing/Undressing What is the patient wearing?: Hospital gown Bra - Perfomed by patient: Thread/unthread left bra strap Bra - Perfomed by helper: Thread/unthread right bra strap, Hook/unhook bra (pull down sports bra), Thread/unthread left bra strap Pull over shirt/dress - Perfomed by patient: Thread/unthread right sleeve, Thread/unthread  left sleeve, Put head through opening Pull over shirt/dress - Perfomed by helper: Pull shirt over trunk Assist Level: 2 helpers Function - Lower Body Dressing/Undressing What is the patient wearing?: Hospital Gown Position: Bed Underwear - Performed by helper: Thread/unthread right underwear leg, Thread/unthread left underwear leg Pants- Performed by helper: Thread/unthread right pants leg, Thread/unthread left pants leg, Pull pants up/down Assist for footwear: Maximal assist Assist for lower body dressing: 2 Helpers Set up : To obtain clothing/put away  Function - Toileting Toileting activity did not occur: No continent bowel/bladder event Toileting steps completed by helper: Adjust clothing prior to toileting, Performs perineal hygiene, Adjust clothing after toileting(per [place], NT report) Assist level: Touching or steadying assistance (Pt.75%)(per [place] Flirt [place], NT report)  Function - Air cabin crew transfer activity did not occur: Safety/medical concerns Assist level to toilet: 2 helpers(per [place], NT report)  Function - Chair/bed transfer Chair/bed transfer method: Lateral scoot Chair/bed transfer assist level: Total assist (Pt < 25%) Chair/bed transfer assistive device: Sliding board Chair/bed transfer details: Verbal cues for precautions/safety, Verbal cues for safe use of DME/AE, Manual facilitation for weight shifting, Manual facilitation for placement  Function - Locomotion: Wheelchair Will patient use wheelchair at discharge?: Yes Type: Manual Max wheelchair distance: 150' Assist Level: Touching or steadying assistance (Pt > 75%) Assist Level: Touching or steadying assistance (Pt > 75%) Wheel 150 feet activity did not occur: Safety/medical concerns Assist Level: Touching or steadying assistance (Pt > 75%) Turns around,maneuvers to table,bed, and toilet,negotiates 3% grade,maneuvers on  rugs and over doorsills: No Function - Locomotion:  Ambulation Ambulation activity did not occur: Safety/medical concerns Walk 10 feet activity did not occur: Safety/medical concerns Walk 50 feet with 2 turns activity did not occur: Safety/medical concerns Walk 150 feet activity did not occur: Safety/medical concerns Walk 10 feet on uneven surfaces activity did not occur: Safety/medical concerns  Function - Comprehension Comprehension: Auditory Comprehension assist level: Follows basic conversation/direction with no assist  Function - Expression Expression: Verbal Expression assist level: Expresses basic needs/ideas: With no assist  Function - Social Interaction Social Interaction assist level: Interacts appropriately 75 - 89% of the time - Needs redirection for appropriate language or to initiate interaction.  Function - Problem Solving Problem solving assist level: Solves basic 50 - 74% of the time/requires cueing 25 - 49% of the time  Function - Memory Memory assist level: Recognizes or recalls 50 - 74% of the time/requires cueing 25 - 49% of the time Patient normally able to recall (first 3 days only): That he or she is in a hospital  Medical Problem List and Plan: 1.Functional and mobility deficitssecondary to debility and encephalopathy from multiple medical issues   Cont CIR    repeat MRI brain and T-spine suggesting stable/improvement. Neurology signed off. 2. DVT Prophylaxis/Anticoagulation: Pharmaceutical:Lovenox 3. Pain Management:tylenol prn 4. Mood:LCSW to follow for evaluation and support.   team to continue to provide support    xanax prn for anxiety 5. Neuropsych: This patientis not fullycapable of making decisions onherown behalf. 6. Skin/Wound Care:Air mattress for pressure relief measures. Local measures to help managesacral /labialMASD PRAFO's for bilateral LE's 7. Fluids/Electrolytes/Nutrition:Monitor I/O.    Continue to offer supplements to help with protein calorie  malnutrition.   Cont Remeron 15 8. C diff colitis:    Vancomycincompleted 2/6 Continue enteric precautions  Repeat C diff PCR + but toxin negative.     Will speak with ID 9. HTN: Monitor BP bid.    Nifedipine bid increased to 60 on 2/6   Imdur increased to 90 on 2/4   Metoprolol increased to 100 BID on 2/3   Hydralazine 10 3 times a day started on 2/13, increased to 25 on 2/14   Remains elevated on 2/19 Vitals:   11/22/17 2308 11/23/17 0529  BP:  (!) 151/71  Pulse: 88 80  Resp:  20  Temp:  97.6 F (36.4 C)  SpO2:  94%  10 T2DM with retinopathy, neuropathy, andnephropathy:Monitor BS ac/hs.    SSI for elevated BS CBG (last 3)  Recent Labs    11/22/17 1733 11/22/17 2059 11/23/17 0627  GLUCAP 121* 110* 85    Relatively controlled on 2/19 11. H/o depression: Continue Effexor and Remeron.  12. Neurogenic bladder:Has chronic indwelling catheter for 2+ months.   Foley out, I/O caths, keep strict I/Os.  13.AKI onCKD with history of overload:    Cr 2.82 on 2/19, IVF started by nephrology, d/ced due to fluid overload, no real changes per Nephro   Daily Labs    Encourage fluids   Appreciate nephrology recs   Cont to monitor 14. Diffuse CAD: treated medically with Metoprolol, Zetia and Plavix. 15.  ABLA on anemia of chronic disease:    Stool OB now neg   Hb 11.8 on 2/19   Appreciate GI recs, EGD/colonoscopy performed on 2/10 showing a total of 5 duodenal ulcers, which are presumed to be a source of bleeding.  16. Leukocytosis: Resolved   WBCs 8.9 on 2/15  Repeat UA relatively unremarkable for infection,  Ucx multi species   Labs ordered for tomorrow 17. Hypoalbuminemia   Cont supplement 18. Hyperkalemia: Resolved   Cont to monitor 19. Acute lower UTI   UA is equivocal, urine culture with Pseudomonas   Cipro completed 2/11-2/13 20.  GI bleed   Believed to be secondary to duodenal ulcers   Appreciate GI recs, 40 mg Protonix twice daily    H.  pylori serology negative   Polyp biopsy on colonoscopy results to be sent patient for GI 21. Dyspnea   Continue 40 twice a day Lasix  CXR reviewed, suggesting fluid overload    LOS (Days) 18 A FACE TO FACE EVALUATION WAS PERFORMED  Ronan Dion Lorie Phenix 11/23/2017, 9:19 AM

## 2017-11-23 NOTE — Progress Notes (Signed)
Occupational Therapy Weekly Progress Note  Patient Details  Name: Laura [name] MRN: [id number] Date of Birth: December 08, 1960  Beginning of progress report period: November 15, 2017 End of progress report period: November 23, 2017  Today's Date: 11/23/2017 OT Individual Time: 1100-1155 OT Individual Time Calculation (min): 55 min    Patient has met 1 of 4 short term goals.  Pt is making minimal progress towards goals. Pt can tolerate weight bearing through BLE, however requiring total assist +2 or use of lift equipment for standing.  Have introduced transfers via slide board to decrease burden of care, however pt continues to lean backwards during transitional movements increasing fall risk.  LB bathing/dressing require +2 when attempting to complete at sit > stand level.  Medically, pt now with increased fluid overload and diarrhea impacting participation.  Plan to begin family education to prepare for d/c and further assess pt needs.  Patient continues to demonstrate the following deficits: muscle weakness,decreased safety awareness and decreased memory, and decreased sitting balance, decreased standing balance, decreased postural control and decreased balance strategies and therefore will continue to benefit from skilled OT intervention to enhance overall performance with BADL and Reduce care partner burden.  Patient not progressing toward long term goals.  See goal revision..  Plan of care revisions: downgraded to mod assist overall.  OT Short Term Goals Week 2:  OT Short Term Goal 1 (Week 2): Pt will complete LB dressing with modA OT Short Term Goal 1 - Progress (Week 2): Progressing toward goal OT Short Term Goal 2 (Week 2): Pt will perform shower transfer with mod A  OT Short Term Goal 2 - Progress (Week 2): Discontinued (comment) OT Short Term Goal 3 (Week 2): Pt with complete sit <> stand with max assist of one caregiver OT Short Term Goal 3 - Progress (Week 2): Discontinued  (comment) OT Short Term Goal 4 (Week 2): Pt will complete grooming at sink side with min A  OT Short Term Goal 4 - Progress (Week 2): Met Week 3:  OT Short Term Goal 1 (Week 3): STG = LTGs due to remaining LOS  Skilled Therapeutic Interventions/Progress Updates:    Treatment session with focus on functional transfers, trunk control, and BUE strengthening.  Pt received seated in w/c.  Educated on use of drop arm BSC for transfers for toileting as pt continues to require +2 for standing and transfers or use of mechanical lift.  Completed transfer to drop arm BSC with slide board with max- total assist due to tendency to lean backwards causing her to slide forward on board, increasing fall risk.  Engaged in lateral leans to simulate pulling pants over hips as needed for toileting.  Attempted to return to w/c, however due to pt leaning backwards and holding on to [facility] arm rest required +2 to complete squat pivot as slide board was sliding due to pt position.  Pt worn out from transfer training.  Provided elevating leg rests as pt reporting discomfort from swelling.  Engaged in 3 sets of [place] with theraband with diagonals and horizontals.  Pt left upright in w/c with legs elevated, quick release belt and chair alarm on, and all needs in reach.  Therapy Documentation Precautions:  Precautions Precautions: Fall Precaution Comments: monitor blood pressure Restrictions Weight Bearing Restrictions: No Pain: Pain Assessment Pain Assessment: No/denies pain Pain Score: 0-No pain  See Function Navigator for Current Functional Status.   Therapy/Group: Individual Therapy  [name] 11/23/2017, 12:10 PM

## 2017-11-23 NOTE — Progress Notes (Signed)
Nutrition Follow-up  DOCUMENTATION CODES:   Not applicable  INTERVENTION:  Continue Nepro Shake po TID, each supplement provides 425 kcal and 19 grams protein.  Continue 30 ml Prostat po BID, each supplement provides 100 kcal and 15 grams of protein.   Encourage adequate PO intake.   NUTRITION DIAGNOSIS:   Increased nutrient needs related to wound healing as evidenced by estimated needs; ongoing  GOAL:   Patient will meet greater than or equal to 90% of their needs; met via supplements  MONITOR:   PO intake, Supplement acceptance, Labs, Weight trends, I & O's, Skin  REASON FOR ASSESSMENT:   Malnutrition Screening Tool    ASSESSMENT:   57 year old female(from [place]) with T2DM with neuropathy and retinopathy, medical HTN, medical non-compliance, CKD stage IV, admission to [facility] hospital 08/21/17 for nephrotic syndrome with fluid overload and encephalopathy She was admitted to [facility] 10/30/17 with weakness and lethargy, loose stools and leucocytosis.  Meal completion has been varied from 10-50%. Pt with loose stools/diarrhea. Per PA, likely related to glycerin suppository as part of bowel regimen. Order has been discontinued. Fibercon has been ordered to help bulk stools. Pt currently has Nepro shake and prostat ordered and has been consuming them. RD to continue with current orders to aid in adequate nutrition.   Labs and medications reviewed. Phosphorous elevated at 5.6.  Diet Order:  Diet renal/carb modified with fluid restriction Diet-HS Snack? Nothing; Fluid restriction: 1200 mL Fluid; Room service appropriate? Yes; Fluid consistency: Thin  EDUCATION NEEDS:   Education needs have been addressed  Skin:  Skin Assessment: Skin Integrity Issues: Skin Integrity Issues:: Unstageable Unstageable: sacrum  Last BM:  2/13  Height:   Ht Readings from Last 1 Encounters:  11/05/17 4' 11"  (1.499 m)    Weight:   Wt Readings from Last 1 Encounters:  11/22/17 127 lb 6.8  oz (57.8 kg)    Ideal Body Weight:  44.5 kg  BMI:  Body mass index is 25.74 kg/m.  Estimated Nutritional Needs:   Kcal:  1600-1800  Protein:  70-85 grams  Fluid:  Per MD    Corrin Parker, MS, RD, LDN Pager # [phone] After hours/ weekend pager # [phone]

## 2017-11-23 NOTE — Progress Notes (Signed)
S:  Having some diarrhea Working with therapy this morning, in good spirits, denies dyspnea On twice daily Lasix at the current time Morning labs: Serum creatinine stable at 2.82, BUN 83, potassium 4.1, bicarbonate 19 Documented 1.2 L urine output yesterday   O:BP (!) 151/71 (BP Location: Left Arm)   Pulse 80   Temp 97.6 F (36.4 C) (Oral)   Resp 20   Ht 4' 11"  (1.499 m)   Wt 57.8 kg (127 lb 6.8 oz)   SpO2 94%   BMI 25.74 kg/m   Intake/Output Summary (Last 24 hours) at 11/23/2017 1034 Last data filed at 11/23/2017 0900 Gross per 24 hour  Intake 980 ml  Output 1200 ml  Net -220 ml   Intake/Output: I/O last 3 completed shifts: In: 1800 [P.O.:900; I.V.:900] Out: 1550 [Urine:1550]  Intake/Output this shift:  Total I/O In: 300 [P.O.:300] Out: -  Weight change:  Gen: NAD CVS: no rub Resp: cta Abd: benign Ext: 3+ edema b/l  Recent Labs  Lab 11/17/17 0458 11/18/17 1110 11/19/17 0738 11/20/17 0548 11/21/17 0719 11/22/17 0943 11/23/17 0451  NA 142 141 139 139 140 138 139  K 4.3 4.1 4.1 4.0 4.2 4.1 4.1  CL 110 111 108 109 109 110 110  CO2 20* 19* 18* 19* 19* 17* 19*  GLUCOSE 126* 129* 112* 92 104* 80 83  BUN 80* 78* 80* 77* 82* 81* 83*  CREATININE 2.60* 2.63* 2.68* 2.72* 2.73* 2.86* 2.82*  ALBUMIN  --  2.4* 2.2*  --   --  2.4* 2.2*  CALCIUM 8.8* 8.7* 8.5* 8.5* 8.4* 8.4* 8.5*  PHOS  --  5.0* 5.2*  --   --  5.4* 5.6*   Liver Function Tests: Recent Labs  Lab 11/19/17 0738 11/22/17 0943 11/23/17 0451  ALBUMIN 2.2* 2.4* 2.2*   No results for input(s): LIPASE, AMYLASE in the last 168 hours. No results for input(s): AMMONIA in the last 168 hours. CBC: Recent Labs  Lab 11/17/17 0458 11/19/17 0738 11/22/17 0943  WBC 10.9* 7.3 8.9  NEUTROABS 6.7 4.2 5.1  HGB 12.0 11.4* 11.8*  HCT 36.5 35.5* 35.6*  MCV 86.3 86.6 87.5  PLT 467* 430* 414*   Cardiac Enzymes: No results for input(s): CKTOTAL, CKMB, CKMBINDEX, TROPONINI in the last 168 hours. CBG: Recent Labs   Lab 11/22/17 0633 11/22/17 1215 11/22/17 1733 11/22/17 2059 11/23/17 0627  GLUCAP 74 83 121* 110* 85    Iron Studies: No results for input(s): IRON, TIBC, TRANSFERRIN, FERRITIN in the last 72 hours. Studies/Results: Dg Chest Port 1 View  Result Date: 11/22/2017 CLINICAL DATA:  Shortness of breath and lower extremity swelling. EXAM: PORTABLE CHEST 1 VIEW COMPARISON:  PA and lateral chest 10/30/2017. Single-view of the chest 08/24/2017. FINDINGS: There is cardiomegaly with pulmonary edema and small to moderate pleural effusions, larger on the left. Aeration is worse than on the most recent examination. No pneumothorax. Lung volumes are low. IMPRESSION: Pulmonary edema with small to moderate pleural effusions, larger on the left. Cardiomegaly noted. Electronically Signed   By: Inge Rise M.D.   On: 11/22/2017 09:28   . collagenase   Topical BID  . enoxaparin (LOVENOX) injection  30 mg Subcutaneous Q24H  . ezetimibe  10 mg Oral Daily  . feeding supplement (NEPRO CARB STEADY)  237 mL Oral TID WC  . feeding supplement (PRO-STAT SUGAR FREE 64)  30 mL Oral BID  . furosemide  40 mg Intravenous Q12H  . hydrALAZINE  25 mg Oral Q8H  . insulin  aspart  0-9 Units Subcutaneous TID WC  . isosorbide mononitrate  90 mg Oral Daily  . metoprolol tartrate  100 mg Oral BID  . mirtazapine  15 mg Oral QHS  . multivitamin  1 tablet Oral QHS  . NIFEdipine  60 mg Oral BID  . pantoprazole  40 mg Oral BID AC  . [START ON 11/25/2017] patiromer  8.4 g Oral Daily  . polycarbophil  1,250 mg Oral BID AC  . sodium bicarbonate  650 mg Oral BID  . venlafaxine XR  150 mg Oral Q breakfast    BMET    Component Value Date/Time   NA 139 11/23/2017 0451   K 4.1 11/23/2017 0451   CL 110 11/23/2017 0451   CO2 19 (L) 11/23/2017 0451   GLUCOSE 83 11/23/2017 0451   BUN 83 (H) 11/23/2017 0451   CREATININE 2.82 (H) 11/23/2017 0451   CALCIUM 8.5 (L) 11/23/2017 0451   GFRNONAA 18 (L) 11/23/2017 0451   GFRAA 20 (L)  11/23/2017 0451   CBC    Component Value Date/Time   WBC 8.9 11/22/2017 0943   RBC 4.07 11/22/2017 0943   HGB 11.8 (L) 11/22/2017 0943   HCT 35.6 (L) 11/22/2017 0943   PLT 414 (H) 11/22/2017 0943   MCV 87.5 11/22/2017 0943   MCH 29.0 11/22/2017 0943   MCHC 33.1 11/22/2017 0943   RDW 16.4 (H) 11/22/2017 0943   LYMPHSABS 3.1 11/22/2017 0943   MONOABS 0.4 11/22/2017 0943   EOSABS 0.2 11/22/2017 0943   BASOSABS 0.1 11/22/2017 0943      Assessment/Plan:  1. AKI/CKD stage 4- likley baseline diabetic nephropathy and worsening with acute health issues.  Labs oerall stable today.  2. ABLA due to GI Bleed and anemia of CKD stage 4- s/p transfusion 3. Normal AG Met Acidosis on NaHCO3 650 BID 4. Hyperkalemia  K in 4s --  5. CDI - s/p completion of po vancomycin for recent C Diff 6. HTN- stable 7. Severe deconditioning/debilitation- cont with PT/OT with inpatient rehab.  Continue Lasix.  Agree with holding V[place] at the current time.  Continue daily renal function panel.Pearson Grippe MD [phone]

## 2017-11-23 NOTE — Progress Notes (Signed)
Physical Therapy Note  Patient Details  Name: Laura [name] MRN: [id number] Date of Birth: Jan 21, 1961 Today's Date: 11/23/2017    Time: [phone] 70 minutes  1:1 No c/o pain.  Pt propelled w/c with bilat UEs 40' x 2 with min A due to decreased attention and motor planning.  Sit to stand in parallel bars x 5 with max A with PT blocking pt's knees and providing cues at hips and trunk for full extension. pt able to stand 30 seconds at a time before fatigue.  Scoot/squat pivot transfer training with max A due to pt with posterior lean.   Seated forward leaning repetition with blocked practice with bilat UEs on ball with manual facilitation at trunk and core.  Nustep for UE/LE strength and endurance x 6 minutes with pt c/o LE fatigue but no pain.  Seated UE and attention task with peg activity with cues for following directions and memory of instructions for task.  Pt left in room with quick release belt and chair alarm, needs at hand.   DONAWERTH,KAREN 11/23/2017, 1:00 PM

## 2017-11-23 NOTE — Progress Notes (Signed)
Patient with multiple stools last night --did receive glycerin suppository as part of bowel program. Will d/c  For now. Hold Valtessa (diarrhea/GI issues also a side effect) as now on lasix bid with stable K+ levels. Will add fiber for bulking agent.

## 2017-11-23 NOTE — Progress Notes (Signed)
Speech Language Pathology Daily Session Note  Patient Details  Name: Sian Rockers MRN: [id number] Date of Birth: 04/09/1961  Today's Date: 11/23/2017 SLP Individual Time: 0806-0901 SLP Individual Time Calculation (min): 55 min  Short Term Goals: Week 3: SLP Short Term Goal 1 (Week 3): Pt will utilize external memory aides to recall new dialy information with Mod A cues.  SLP Short Term Goal 2 (Week 3): Pt will sustain attention to basic familiar task for ~ 20 minutes with Min A cues.  SLP Short Term Goal 3 (Week 3): Pt will complete basic familiar problem solving task related to ADL with Min A cues.  SLP Short Term Goal 4 (Week 3): Pt will increase vocal intensity to achieve intelligibility in mildly noisy environments with mod verbal cues.    Skilled Therapeutic Interventions:   Pt was seen for skilled ST targeting cognitive goals. Pt was bright and alert upon therapist's arrival, agreeable to getting out of bed.  Pt transferred to wheelchair with assistance from nurse tech but pt needed max to total cues to reposition hips back in wheelchair due to weakness.   Pt was verbose during therapy session today and needed intermittent min verbal cues for redirection to her breakfast meal.  Pt still having poor PO intake which she reports to be related to decreased appetite.   After completing meal, pt was able to sequence sinkside ADLs with mod I.  Pt was  left in wheelchair with quick release belt donned, chair alarm set, and call bell within reach.  Continue per current plan of care.     Function:  Eating Eating   Modified Consistency Diet: No Eating Assist Level: More than reasonable amount of time           Cognition Comprehension Comprehension assist level: Follows basic conversation/direction with no assist  Expression   Expression assist level: Expresses basic needs/ideas: With extra time/assistive device  Social Interaction Social Interaction assist level: Interacts  appropriately 75 - 89% of the time - Needs redirection for appropriate language or to initiate interaction.  Problem Solving Problem solving assist level: Solves basic 75 - 89% of the time/requires cueing 10 - 24% of the time  Memory Memory assist level: Recognizes or recalls 50 - 74% of the time/requires cueing 25 - 49% of the time    Pain Pain Assessment Pain Assessment: No/denies pain Pain Score: 0-No pain  Therapy/Group: Individual Therapy  Bettylou Frew, Selinda Orion 11/23/2017, 11:02 AM

## 2017-11-23 NOTE — Consult Note (Signed)
Neuropsychological Consultation   Patient:   Laura [name]   DOB:   1961-01-01  MR Number:  [id number]  Location:  [place] [address] [facility] [address] [id number] [place] [address] Dept: [facility]: [phone]           Date of Service:   11/23/2017  Start Time:   3 PM End Time:   4 PM  Provider/Observer:  Ilean Skill, Psy.D.       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  Stage IV CKD.  Admitted to [facility] hospital on 08/21/2017 for nephrotic syndrome with fluid overload and encephalopathy.  Cardiac, renal and neurologic issues noted.  Continued with weakness and lethargy.  Dx with C diff.  Patient has reported SI for some time going back well past current admission.  No reports of actual attempts or active self harm behaviors although medical no-compliance could be passive self harm behavior.  Patient denies this element and stays she was trying to treat her medical issues homiopathically.  This is still a possibility.    Reason for Service: [name] was referred for neuropsychological consultation due to concerns about coping and possible issues of clinical depression.  Below is the HPI for the current admission.   [name] is a 57 year old female(from [place])with T2DM with neuropathy and retinopathy, medical HTN, medical non-compliance,CKDstage IV, [facility] with fluid overload and encephalopathy.She was treated with HD briefly and extensive cardiac, renal and neurologic work up done revealing diffuse CAD, depression, urinary retention due to neurogenic bladder as well as ADEM. She was treated with steroids and discharged to [facility]  rehab on 10/07/17. Will likely need HD in the near future per notes. She was admitted to [facility] 10/30/17 with weakness and lethargy, loose stools and leucocytosis. She was started on IV rocephin due to concerns of urosepsis but and diarrhea due to C diff colitis. BC and UCS negative but stools positive for C diff. She was started on oral vancomycin for treatment and has had decrease in loose stools. WOC following input on moisture associated skin damageddue to fecal and urinary incontinence. Po intake poor and multiple supplements added to help with malnutrition. She continues to have diffuse weakness query ciritcal illness myopathy as well as sever debility. CIR recommended due to functional deficits.  Current Status:  Today it was very hard to have indept discussions with patient.  She was asleep upon arival and hard to wake.  Her speech was very low and void of depth in cognitive.  She did state at one time that she wanted to kill self in very non-emotional way, almost mechanically.  She then immediately stated she wanted to go to sleep.  I asked if she wanted to die or go to sleep and she said "go to sleep."  Patient reports that she has stated this many times before.  She also reports that she is not actively thinking about or wanting to harm self.    Behavioral Observation: [name]  presents as a 57 y.o.-year-old Right  Female who appeared her stated age. her dress was Appropriate and she was Well Groomed and her manners were Appropriate to the situation.  her participation was indicative of Inattentive, Redirectable and  Resistant behaviors.  There were any physical disabilities noted.  she displayed an inappropriate level of cooperation and motivation.     Interactions:    Minimal Drowsy, Redirectable and Resistant  Attention:   abnormal and attention span appeared shorter than expected for age  Memory:   abnormal; global memory impairment noted  Visuo-spatial:  not examined  Speech  (Volume):  low  Speech:   slurred; garbled  Thought Process:  Circumstantial  Though Content:  WNL; not homicidal and while reported at one point that she wanted to kill self when questioned in depth denied actually wanting to die but was just upset with all of her medical issues.  Orientation:   person, place and situation  Judgment:   Poor  Planning:   Poor  Affect:    Depressed, Flat and Lethargic  Mood:    Dysphoric  Insight:   Shallow  Intelligence:   normal  Medical History:   Past Medical History:  Diagnosis Date  . C. difficile colitis   . CAD (coronary artery disease)   . CHF (congestive heart failure) ([place])   . Chronic bronchitis ([place])   . Diabetic peripheral neuropathy ([place])   . Duodenal ulcer   . High cholesterol   . Hypertension   . Nephrotic syndrome   . Type II diabetes mellitus ([place])        Psychiatric History:  Patient likely has prior history of depression and likely not fully engaged in self care over past few years.  Her medical issues have worsened mostly due to non-compliance.  Family Med/Psych History:  Family History  Problem Relation Age of Onset  . Diabetes Father     Risk of Suicide/Violence: low Reports are that the patient has made similar statements before with no active attempts to harm self.  She is not an immediate danger to self.    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  Stage IV CKD.  Admitted to [facility] hospital on 08/21/2017 for nephrotic syndrome with fluid overload and encephalopathy.  Cardiac, renal and neurologic issues noted.  Continued with weakness and lethargy.  Dx with C diff.  Patient has reported SI for some time going back well past current admission.  No reports of actual attempts or active self harm behaviors although medical no-compliance could be passive self harm behavior.  Patient denies this element and  stays she was trying to treat her medical issues homiopathically.  This is still a possibility.   Today it was very hard to have indept discussions with patient.  She was asleep upon arival and hard to wake.  Her speech was very low and void of depth in cognitive.  She did state at one time that she wanted to kill self in very non-emotional way, almost mechanically.  She then immediately stated she wanted to go to sleep.  I asked if she wanted to die or go to sleep and she said "go to sleep."  Patient reports that she has stated this many times before.  She also reports that she is not actively thinking about or wanting to harm self.    Patient does not appear to be in any immediate danger to self or others.  However, it is likely that depression etc are playing some roll in her long-term issues and should be addressed after discharge from inpatient unit if possible.    Diagnosis:  Prior history of depression and dependent behaviors       Electronically Signed   _______________________ Ilean Skill, Psy.D.

## 2017-11-24 ENCOUNTER — Inpatient Hospital Stay (HOSPITAL_COMMUNITY): Payer: No Typology Code available for payment source | Admitting: Physical Therapy

## 2017-11-24 ENCOUNTER — Encounter (HOSPITAL_COMMUNITY): Payer: No Typology Code available for payment source | Admitting: Speech Pathology

## 2017-11-24 ENCOUNTER — Inpatient Hospital Stay (HOSPITAL_COMMUNITY): Payer: No Typology Code available for payment source | Admitting: Occupational Therapy

## 2017-11-24 ENCOUNTER — Inpatient Hospital Stay (HOSPITAL_COMMUNITY): Payer: No Typology Code available for payment source

## 2017-11-24 DIAGNOSIS — R55 Syncope and collapse: Secondary | ICD-10-CM | POA: Insufficient documentation

## 2017-11-24 DIAGNOSIS — N390 Urinary tract infection, site not specified: Secondary | ICD-10-CM

## 2017-11-24 DIAGNOSIS — N179 Acute kidney failure, unspecified: Secondary | ICD-10-CM

## 2017-11-24 LAB — CBC WITH DIFFERENTIAL/PLATELET
BASOS PCT: 0 %
Basophils Absolute: 0 10*3/uL (ref 0.0–0.1)
EOS ABS: 0.2 10*3/uL (ref 0.0–0.7)
Eosinophils Relative: 3 %
HCT: 32.4 % — ABNORMAL LOW (ref 36.0–46.0)
HEMOGLOBIN: 10.7 g/dL — AB (ref 12.0–15.0)
LYMPHS PCT: 43 %
Lymphs Abs: 3.4 10*3/uL (ref 0.7–4.0)
MCH: 28.5 pg (ref 26.0–34.0)
MCHC: 33 g/dL (ref 30.0–36.0)
MCV: 86.4 fL (ref 78.0–100.0)
MONO ABS: 0.5 10*3/uL (ref 0.1–1.0)
Monocytes Relative: 6 %
NEUTROS PCT: 48 %
Neutro Abs: 3.8 10*3/uL (ref 1.7–7.7)
PLATELETS: 375 10*3/uL (ref 150–400)
RBC: 3.75 MIL/uL — ABNORMAL LOW (ref 3.87–5.11)
RDW: 16.5 % — ABNORMAL HIGH (ref 11.5–15.5)
WBC: 7.9 10*3/uL (ref 4.0–10.5)

## 2017-11-24 LAB — TROPONIN I

## 2017-11-24 LAB — RENAL FUNCTION PANEL
ANION GAP: 11 (ref 5–15)
Albumin: 2.2 g/dL — ABNORMAL LOW (ref 3.5–5.0)
BUN: 87 mg/dL — ABNORMAL HIGH (ref 6–20)
CALCIUM: 8.5 mg/dL — AB (ref 8.9–10.3)
CHLORIDE: 110 mmol/L (ref 101–111)
CO2: 19 mmol/L — AB (ref 22–32)
Creatinine, Ser: 2.93 mg/dL — ABNORMAL HIGH (ref 0.44–1.00)
GFR, EST AFRICAN AMERICAN: 20 mL/min — AB (ref >60)
GFR, EST NON AFRICAN AMERICAN: 17 mL/min — AB (ref >60)
Glucose, Bld: 80 mg/dL (ref 65–99)
Phosphorus: 5.4 mg/dL — ABNORMAL HIGH (ref 2.5–4.6)
Potassium: 3.6 mmol/L (ref 3.5–5.1)
SODIUM: 140 mmol/L (ref 135–145)

## 2017-11-24 LAB — GLUCOSE, CAPILLARY
GLUCOSE-CAPILLARY: 112 mg/dL — AB (ref 65–99)
GLUCOSE-CAPILLARY: 88 mg/dL (ref 65–99)
GLUCOSE-CAPILLARY: 95 mg/dL (ref 65–99)
Glucose-Capillary: 100 mg/dL — ABNORMAL HIGH (ref 65–99)

## 2017-11-24 LAB — HEMOGLOBIN AND HEMATOCRIT, BLOOD
HCT: 33.6 % — ABNORMAL LOW (ref 36.0–46.0)
Hemoglobin: 11 g/dL — ABNORMAL LOW (ref 12.0–15.0)

## 2017-11-24 MED ORDER — POTASSIUM CHLORIDE CRYS ER 20 MEQ PO TBCR
40.0000 meq | EXTENDED_RELEASE_TABLET | Freq: Every day | ORAL | Status: AC
  Administered 2017-11-24 – 2017-11-25 (×2): 40 meq via ORAL
  Filled 2017-11-24 (×2): qty 2

## 2017-11-24 MED ORDER — FUROSEMIDE 10 MG/ML IJ SOLN
40.0000 mg | Freq: Once | INTRAMUSCULAR | Status: AC
  Administered 2017-11-24: 40 mg via INTRAVENOUS
  Filled 2017-11-24: qty 4

## 2017-11-24 MED ORDER — FUROSEMIDE 10 MG/ML IJ SOLN
40.0000 mg | Freq: Two times a day (BID) | INTRAMUSCULAR | Status: DC
  Administered 2017-11-25 – 2017-11-26 (×3): 40 mg via INTRAVENOUS
  Filled 2017-11-24 (×3): qty 4

## 2017-11-24 NOTE — Progress Notes (Addendum)
Physical Therapy Note  Patient Details  Name: Laura [name] MRN: [id number] Date of Birth: 08/11/1961 Today's Date: 11/24/2017  1345-1540, 55 min individual tx Pain: none per pt  Pam, PA advised this PT to see pt for tx, proceeding slowly.  See vitals.  Pt c/o dizziness upon sitting up EOB, but resolved in a couple of minutes.  Deflated bed> w/c lateral scoot with slide board, to R +2  assist to stabilize chair and assist pt with lifting hips.  Max cues for head/hips relationship.  In therapy room, w/c>< mat level transfer with 4" curb-step under pt 's feet, and shoes donned, mod assist each direction for slide board transfer.    Pt stated that she wanted to stay up in w/c at end of tx.  Secundino Ginger, NT informed.  Quick release belt donned, seat alarm set and bil LEs elevated.    See function navigator for current status.  Vikram Tillett 11/24/2017, 12:31 PM

## 2017-11-24 NOTE — Progress Notes (Signed)
Patient with syncopal episode during attempts at car transfer in the gym. Husband here for family education.  She aroused once supine but complained of difficulty breathing. Symptoms improved once HOB raised to 30 degrees but still reported upper chest discomfort. Blood pressure and heart rate stable. No diarrhea yesterday or overnight.   Heart: RRR Lungs expiratory wheezes heard in upper airway and increased WOB. Ext: edema BUE improving. Abdomen: No pain or discomfort.   A/P:  1. Syncope with SOB: Will treat with dose of IV lasix as appears to be overloaded. Will check EKG and cardiac enzymes to rule out cardiac event. Repeat CXR for follow up on CHF.   2. Urinary retention: Missed 8 am cath due to therapy. I/O cath for 500 cc and feels better.

## 2017-11-24 NOTE — Progress Notes (Signed)
Subjective/Complaints: Pt seen lying in bed this AM.  She slept well overnight.  She denies complaints.  Later notified regarding syncopal episode (see PA note).   ROS: Limited due to cognition  Objective: Vital Signs: Blood pressure (!) 144/74, pulse 88, temperature 97.8 F (36.6 C), temperature source Oral, resp. rate 18, height _0  (1.499 m), weight 57.4 kg (126 lb 8.7 oz), SpO2 90 %. Dg Chest Port 1 View  Result Date: 11/24/2017 CLINICAL DATA:  Shortness of Breath EXAM: PORTABLE CHEST 1 VIEW COMPARISON:  November 22, 2017 FINDINGS: There is persistent airspace consolidation in the left lower lobe with small left pleural effusion. There is patchy airspace opacity throughout the right mid lower lung zones, a new finding. There is a small loculated effusion on the right. There is cardiomegaly with pulmonary venous hypertension. No adenopathy evident. There is degenerative change in each shoulder. IMPRESSION: 1. New airspace opacity felt to represent pneumonia in the right mid and lower lung zones. Small loculated pleural effusion on the right. 2. Persistent airspace consolidation left lower lobe with small left pleural effusion. 3. Underlying pulmonary vascular congestion with cardiomegaly and pulmonary venous hypertension. Electronically Signed   By: Lowella Grip III M.D.   On: 11/24/2017 11:53   Results for orders placed or performed during the hospital encounter of 11/05/17 (from the past 72 hour(s))  Glucose, capillary     Status: None   Collection Time: 11/21/17  4:42 PM  Result Value Ref Range   Glucose-Capillary 92 65 - 99 mg/dL  Glucose, capillary     Status: Abnormal   Collection Time: 11/21/17  8:29 PM  Result Value Ref Range   Glucose-Capillary 101 (H) 65 - 99 mg/dL  Glucose, capillary     Status: None   Collection Time: 11/22/17  6:33 AM  Result Value Ref Range   Glucose-Capillary 74 65 - 99 mg/dL  CBC with Differential/Platelet     Status: Abnormal   Collection  Time: 11/22/17  9:43 AM  Result Value Ref Range   WBC 8.9 4.0 - 10.5 K/uL   RBC 4.07 3.87 - 5.11 MIL/uL   Hemoglobin 11.8 (L) 12.0 - 15.0 g/dL   HCT 35.6 (L) 36.0 - 46.0 %   MCV 87.5 78.0 - 100.0 fL   MCH 29.0 26.0 - 34.0 pg   MCHC 33.1 30.0 - 36.0 g/dL   RDW 16.4 (H) 11.5 - 15.5 %   Platelets 414 (H) 150 - 400 K/uL   Neutrophils Relative % 57 %   Neutro Abs 5.1 1.7 - 7.7 K/uL   Lymphocytes Relative 35 %   Lymphs Abs 3.1 0.7 - 4.0 K/uL   Monocytes Relative 4 %   Monocytes Absolute 0.4 0.1 - 1.0 K/uL   Eosinophils Relative 3 %   Eosinophils Absolute 0.2 0.0 - 0.7 K/uL   Basophils Relative 1 %   Basophils Absolute 0.1 0.0 - 0.1 K/uL    Comment: Performed at [facility], [address] [place], [place] [address]  Renal function panel     Status: Abnormal   Collection Time: 11/22/17  9:43 AM  Result Value Ref Range   Sodium 138 135 - 145 mmol/L   Potassium 4.1 3.5 - 5.1 mmol/L   Chloride 110 101 - 111 mmol/L   CO2 17 (L) 22 - 32 mmol/L   Glucose, Bld 80 65 - 99 mg/dL   BUN 81 (H) 6 - 20 mg/dL   Creatinine, Ser 2.86 (H) 0.44 - 1.00 mg/dL  Calcium 8.4 (L) 8.9 - 10.3 mg/dL   Phosphorus 5.4 (H) 2.5 - 4.6 mg/dL   Albumin 2.4 (L) 3.5 - 5.0 g/dL   GFR calc non Af Amer 17 (L) >60 mL/min   GFR calc Af Amer 20 (L) >60 mL/min    Comment: (NOTE) The eGFR has been calculated using the CKD EPI equation. This calculation has not been validated in all clinical situations. eGFR's persistently <60 mL/min signify possible Chronic Kidney Disease.    Anion gap 11 5 - 15    Comment: Performed at [facility] [address] [place], [place] [address]  Glucose, capillary     Status: None   Collection Time: 11/22/17 12:15 PM  Result Value Ref Range   Glucose-Capillary 83 65 - 99 mg/dL  Glucose, capillary     Status: Abnormal   Collection Time: 11/22/17  5:33 PM  Result Value Ref Range   Glucose-Capillary 121 (H) 65 - 99 mg/dL  Glucose, capillary     Status: Abnormal    Collection Time: 11/22/17  8:59 PM  Result Value Ref Range   Glucose-Capillary 110 (H) 65 - 99 mg/dL  Renal function panel     Status: Abnormal   Collection Time: 11/23/17  4:51 AM  Result Value Ref Range   Sodium 139 135 - 145 mmol/L   Potassium 4.1 3.5 - 5.1 mmol/L   Chloride 110 101 - 111 mmol/L   CO2 19 (L) 22 - 32 mmol/L   Glucose, Bld 83 65 - 99 mg/dL   BUN 83 (H) 6 - 20 mg/dL   Creatinine, Ser 2.82 (H) 0.44 - 1.00 mg/dL   Calcium 8.5 (L) 8.9 - 10.3 mg/dL   Phosphorus 5.6 (H) 2.5 - 4.6 mg/dL   Albumin 2.2 (L) 3.5 - 5.0 g/dL   GFR calc non Af Amer 18 (L) >60 mL/min   GFR calc Af Amer 20 (L) >60 mL/min    Comment: (NOTE) The eGFR has been calculated using the CKD EPI equation. This calculation has not been validated in all clinical situations. eGFR's persistently <60 mL/min signify possible Chronic Kidney Disease.    Anion gap 10 5 - 15    Comment: Performed at [facility] [address], [place], [place] [address]  Glucose, capillary     Status: None   Collection Time: 11/23/17  6:27 AM  Result Value Ref Range   Glucose-Capillary 85 65 - 99 mg/dL  Glucose, capillary     Status: None   Collection Time: 11/23/17 11:31 AM  Result Value Ref Range   Glucose-Capillary 99 65 - 99 mg/dL  Glucose, capillary     Status: None   Collection Time: 11/23/17  5:21 PM  Result Value Ref Range   Glucose-Capillary 81 65 - 99 mg/dL  Glucose, capillary     Status: None   Collection Time: 11/23/17  9:03 PM  Result Value Ref Range   Glucose-Capillary 75 65 - 99 mg/dL  Renal function panel     Status: Abnormal   Collection Time: 11/24/17  5:40 AM  Result Value Ref Range   Sodium 140 135 - 145 mmol/L   Potassium 3.6 3.5 - 5.1 mmol/L   Chloride 110 101 - 111 mmol/L   CO2 19 (L) 22 - 32 mmol/L   Glucose, Bld 80 65 - 99 mg/dL   BUN 87 (H) 6 - 20 mg/dL   Creatinine, Ser 2.93 (H) 0.44 - 1.00 mg/dL   Calcium 8.5 (L) 8.9 -  10.3 mg/dL   Phosphorus 5.4 (H) 2.5 - 4.6 mg/dL   Albumin  2.2 (L) 3.5 - 5.0 g/dL   GFR calc non Af Amer 17 (L) >60 mL/min   GFR calc Af Amer 20 (L) >60 mL/min    Comment: (NOTE) The eGFR has been calculated using the CKD EPI equation. This calculation has not been validated in all clinical situations. eGFR's persistently <60 mL/min signify possible Chronic Kidney Disease.    Anion gap 11 5 - 15    Comment: Performed at [facility] [address], [place], [place] [address]  CBC with Differential/Platelet     Status: Abnormal   Collection Time: 11/24/17  5:40 AM  Result Value Ref Range   WBC 7.9 4.0 - 10.5 K/uL    Comment: WHITE COUNT CONFIRMED ON SMEAR   RBC 3.75 (L) 3.87 - 5.11 MIL/uL   Hemoglobin 10.7 (L) 12.0 - 15.0 g/dL   HCT 32.4 (L) 36.0 - 46.0 %   MCV 86.4 78.0 - 100.0 fL   MCH 28.5 26.0 - 34.0 pg   MCHC 33.0 30.0 - 36.0 g/dL   RDW 16.5 (H) 11.5 - 15.5 %   Platelets 375 150 - 400 K/uL    Comment: PLATELET COUNT CONFIRMED BY SMEAR   Neutrophils Relative % 48 %   Lymphocytes Relative 43 %   Monocytes Relative 6 %   Eosinophils Relative 3 %   Basophils Relative 0 %   Neutro Abs 3.8 1.7 - 7.7 K/uL   Lymphs Abs 3.4 0.7 - 4.0 K/uL   Monocytes Absolute 0.5 0.1 - 1.0 K/uL   Eosinophils Absolute 0.2 0.0 - 0.7 K/uL   Basophils Absolute 0.0 0.0 - 0.1 K/uL   Smear Review MORPHOLOGY UNREMARKABLE     Comment: Performed at [facility], [place] [address], [place], [place] [address]  Glucose, capillary     Status: Abnormal   Collection Time: 11/24/17  6:26 AM  Result Value Ref Range   Glucose-Capillary 112 (H) 65 - 99 mg/dL  Glucose, capillary     Status: Abnormal   Collection Time: 11/24/17 11:17 AM  Result Value Ref Range   Glucose-Capillary 100 (H) 65 - 99 mg/dL  Troponin I     Status: None   Collection Time: 11/24/17 11:38 AM  Result Value Ref Range   Troponin I <0.03 <0.03 ng/mL    Comment: Performed at [facility], [place] [address] [place], [place] [address]    General: NAD. Vital signs reviewed. HEENT:  Normocephalic, atraumatic.  Cardio: RRR. No JVD. Resp: CTA B/L and Unlabored GI: BS positive and ND Musc/Skel:  No edmea or tenderness. Neuro: Alert  Motor:  LUE: 5/5 proximal to distal RUE: 4+/5 proximal to distal  RLE: HF: 2+/5, ADF/PF 2/5 (stable) LLE: HF 3-/5, ADF/PF 2+/5 (stable) Skin: Intact. Warm and dry.  Psych: Flat, cooperative.   Assessment/Plan: 1. Functional deficits secondary to debility and encephalopathy which require 3+ hours per day of interdisciplinary therapy in a comprehensive inpatient rehab setting. Physiatrist is providing close team supervision and 24 hour management of active medical problems listed below. Physiatrist and rehab team continue to assess barriers to discharge/monitor patient progress toward functional and medical goals. FIM: Function - Bathing Position: Bed Body parts bathed by patient: Right arm, Left arm, Chest, Abdomen, Right upper leg, Left upper leg Body parts bathed by helper: Front perineal area, Buttocks, Right lower leg, Left lower leg, Back Bathing not applicable: Left upper leg, Left  lower leg, Right lower leg, Right upper leg Assist Level: (Mod assist)  Function- Upper Body Dressing/Undressing What is the patient wearing?: Pull over shirt/dress Bra - Perfomed by patient: Thread/unthread left bra strap Bra - Perfomed by helper: Thread/unthread right bra strap, Hook/unhook bra (pull down sports bra), Thread/unthread left bra strap Pull over shirt/dress - Perfomed by patient: Thread/unthread right sleeve, Thread/unthread left sleeve, Put head through opening Pull over shirt/dress - Perfomed by helper: Pull shirt over trunk Assist Level: Touching or steadying assistance(Pt > 75%) Function - Lower Body Dressing/Undressing What is the patient wearing?: Pants, Non-skid slipper socks Position: Bed Underwear - Performed by helper: Thread/unthread right underwear leg, Thread/unthread left underwear leg Pants- Performed by patient: Pull  pants up/down Pants- Performed by helper: Thread/unthread left pants leg, Thread/unthread right pants leg Non-skid slipper socks- Performed by helper: Don/doff right sock, Don/doff left sock Assist for footwear: Maximal assist Assist for lower body dressing: (Max assist) Set up : To obtain clothing/put away  Function - Toileting Toileting activity did not occur: No continent bowel/bladder event Toileting steps completed by helper: Adjust clothing prior to toileting, Performs perineal hygiene, Adjust clothing after toileting(per [place], NT report) Assist level: Touching or steadying assistance (Pt.75%)(per [place], NT report)  Function - Air cabin crew transfer activity did not occur: Safety/medical concerns Toilet transfer assistive device: Drop arm commode, Sliding board, Bedside commode Assist level to toilet: 2 helpers(per [place], NT report) Assist level to bedside commode (at bedside): Total assist (Pt < 25%) Assist level from bedside commode (at bedside): Total assist (Pt < 25%)  Function - Chair/bed transfer Chair/bed transfer method: Lateral scoot Chair/bed transfer assist level: Total assist (Pt < 25%) Chair/bed transfer assistive device: Sliding board Chair/bed transfer details: Verbal cues for precautions/safety, Verbal cues for safe use of DME/AE, Manual facilitation for weight shifting, Manual facilitation for placement  Function - Locomotion: Wheelchair Will patient use wheelchair at discharge?: Yes Type: Manual Max wheelchair distance: 150' Assist Level: Touching or steadying assistance (Pt > 75%) Assist Level: Touching or steadying assistance (Pt > 75%) Wheel 150 feet activity did not occur: Safety/medical concerns Assist Level: Touching or steadying assistance (Pt > 75%) Turns around,maneuvers to table,bed, and toilet,negotiates 3% grade,maneuvers on rugs and over doorsills: No Function - Locomotion: Ambulation Ambulation  activity did not occur: Safety/medical concerns Walk 10 feet activity did not occur: Safety/medical concerns Walk 50 feet with 2 turns activity did not occur: Safety/medical concerns Walk 150 feet activity did not occur: Safety/medical concerns Walk 10 feet on uneven surfaces activity did not occur: Safety/medical concerns  Function - Comprehension Comprehension: Auditory Comprehension assist level: Understands basic 90% of the time/cues < 10% of the time  Function - Expression Expression: Verbal Expression assist level: Expresses basic 90% of the time/requires cueing < 10% of the time.  Function - Social Interaction Social Interaction assist level: Interacts appropriately 75 - 89% of the time - Needs redirection for appropriate language or to initiate interaction.  Function - Problem Solving Problem solving assist level: Solves basic 75 - 89% of the time/requires cueing 10 - 24% of the time  Function - Memory Memory assist level: Recognizes or recalls 50 - 74% of the time/requires cueing 25 - 49% of the time Patient normally able to recall (first 3 days only): That he or she is in a hospital  Medical Problem List and Plan: 1.Functional and mobility deficitssecondary to debility and encephalopathy from multiple medical issues   Cont CIR  repeat MRI brain and T-spine suggesting stable/improvement. Neurology signed off. 2. DVT Prophylaxis/Anticoagulation: Pharmaceutical:Lovenox 3. Pain Management:tylenol prn 4. Mood:LCSW to follow for evaluation and support.   team to continue to provide support    xanax prn for anxiety 5. Neuropsych: This patientis not fullycapable of making decisions onherown behalf. 6. Skin/Wound Care:Air mattress for pressure relief measures. Local measures to help managesacral /labialMASD PRAFO's for bilateral LE's 7. Fluids/Electrolytes/Nutrition:Monitor I/O.    Continue to offer supplements to help with protein calorie  malnutrition.   Cont Remeron 15 qhs 8. C diff colitis:    Vancomycincompleted 2/6 Continue enteric precautions  Repeat C diff PCR + but toxin negative.     Appreciate ID recs, no plans to treat at present 9. HTN: Monitor BP bid.    Nifedipine bid increased to 60 on 2/6   Imdur increased to 90 on 2/4   Metoprolol increased to 100 BID on 2/3   Hydralazine 10 3 times a day started on 2/13, increased to 25 on 2/14   Remains elevated on 2/20 Vitals:   11/24/17 0356 11/24/17 1200  BP: (!) 150/75 (!) 144/74  Pulse: 86 88  Resp: 18   Temp: 97.8 F (36.6 C)   SpO2: 98% 90%  10 T2DM with retinopathy, neuropathy, andnephropathy:Monitor BS ac/hs.    SSI for elevated BS CBG (last 3)  Recent Labs    11/23/17 2103 11/24/17 0626 11/24/17 1117  GLUCAP 75 112* 100*    Relatively controlled on 2/20 11. H/o depression: Continue Effexor and Remeron.  12. Neurogenic bladder:Has chronic indwelling catheter for 2+ months.   Foley out, I/O caths, keep strict I/Os.  13.AKI onCKD with history of overload:    Cr 2.93 on 2/20, IVF started by nephrology, d/ced due to fluid overload, no real changes per Nephro   Daily Labs    Encourage fluids   Appreciate nephrology recs   Cont to monitor 14. Diffuse CAD: treated medically with Metoprolol, Zetia and Plavix. 15.  ABLA on anemia of chronic disease:    Stool OB now neg   Hb 10.7 on 2/20   Appreciate GI recs, EGD/colonoscopy performed on 2/10 showing a total of 5 duodenal ulcers, which are presumed to be a source of bleeding.  16. Leukocytosis: Resolved   Repeat UA relatively unremarkable for infection, Ucx multi species  17. Hypoalbuminemia   Cont supplement 18. Hyperkalemia: Resolved   Cont to monitor    K+ 40 daily x2 days due to lasix 19. Acute lower UTI   UA is equivocal, urine culture with Pseudomonas   Cipro completed 2/11-2/13 20.  GI bleed   Believed to be secondary to duodenal ulcers   Appreciate GI recs, 40  mg Protonix twice daily    H. pylori serology negative   Polyp biopsy on colonoscopy results to be sent patient for GI 21. Dyspnea   Continue Lasix  CXR reviewed, suggesting fluid overload 22. Syncope  Will speak with teaching service   Additional IV dose lasix given on 2/20   ECG/CE ordered  Repeat CXR ordered      LOS (Days) 19 A FACE TO FACE EVALUATION WAS PERFORMED  Cyriah Childrey Lorie Phenix 11/24/2017, 1:14 PM

## 2017-11-24 NOTE — Progress Notes (Signed)
Speech Language Pathology Daily Session Note  Patient Details  Name: Laura [name] MRN: [id number] Date of Birth: Sep 22, 1961  Today's Date: 11/24/2017 SLP Individual Time: 1219-7588 SLP Individual Time Calculation (min): 33 min  Short Term Goals: Week 3: SLP Short Term Goal 1 (Week 3): Pt will utilize external memory aides to recall new dialy information with Mod A cues.  SLP Short Term Goal 2 (Week 3): Pt will sustain attention to basic familiar task for ~ 20 minutes with Min A cues.  SLP Short Term Goal 3 (Week 3): Pt will complete basic familiar problem solving task related to ADL with Min A cues.  SLP Short Term Goal 4 (Week 3): Pt will increase vocal intensity to achieve intelligibility in mildly noisy environments with mod verbal cues.    Skilled Therapeutic Interventions:  Pt was seen for skilled ST targeting family education.  Pt's husband was present and remained actively engaged in training.  SLP discussed pt's current goals and level of function, emphasizing pt's current deficits related to recall, problem solving, and attention in the setting of a combination of multiple medical comorbidities including encephalopathy, depression, and medication effects.  Discussed strategies for improving pt's mentation and maximizing pt's independence for basic functional tasks such as making environmental modifications to maintain alertness, posting calendars and written reminders around the house, and keeping a basic, consistent routine across caregivers in the home environment.  All husband's questions were answered to his satisfaction at this time.  Pt was left in bed with bed alarm set and call bell within reach.  Continue per current plan of care.    Function:  Eating Eating                 Cognition Comprehension Comprehension assist level: Understands basic 90% of the time/cues < 10% of the time  Expression   Expression assist level: Expresses basic 90% of the time/requires  cueing < 10% of the time.  Social Interaction Social Interaction assist level: Interacts appropriately 75 - 89% of the time - Needs redirection for appropriate language or to initiate interaction.  Problem Solving Problem solving assist level: Solves basic 75 - 89% of the time/requires cueing 10 - 24% of the time  Memory Memory assist level: Recognizes or recalls 50 - 74% of the time/requires cueing 25 - 49% of the time    Pain Pain Assessment Pain Assessment: No/denies pain  Therapy/Group: Individual Therapy  Jennipher Weatherholtz, Selinda Orion 11/24/2017, 12:01 PM

## 2017-11-24 NOTE — Consult Note (Addendum)
Name: Laura [name] MRN: [id number] DOB: 11/25/1960    ADMISSION DATE:  11/05/2017 CONSULTATION DATE:  2/20  REFERRING MD :  Rehab MD   CHIEF COMPLAINT:  Dyspnea   BRIEF PATIENT DESCRIPTION: 57yo female with multiple chronic medical issues including poorly controlled DM, CKD 4, CAD, anemia, CDiff colitis with recent prolonged hospitalization followed by SNF stay and readmission for UTI, now in CIR for severe deconditioning.  She has been volume overloaded with some ongoing attempts at diuresis, but on 2/20 had ?syncopal episode with worsening dyspnea and PCCM consulted to comment on CXR and recs re: abx.   SIGNIFICANT EVENTS    STUDIES:  BLE venous dopplers 2/20>>>   HISTORY OF PRESENT ILLNESS:  57yo female with multiple chronic medical issues including poorly controlled DM, CKD 4, CAD, anemia, CDiff colitis with recent prolonged hospitalization followed by SNF stay and readmission for UTI, now in CIR for severe deconditioning.  She has been volume overloaded with some ongoing attempts at diuresis, but on 2/20 had ?syncopal episode with worsening dyspnea and PCCM consulted to comment on CXR and recs re: abx.   Currently feeling better.  Still some mild SOB, mild orthopnea.  Denies chest pain, dizziness, further syncope, hemoptysis, fever, cough, purulent sputum, leg/calf pain.   PAST MEDICAL HISTORY :   has a past medical history of C. difficile colitis, CAD (coronary artery disease), CHF (congestive heart failure) (HCC), Chronic bronchitis ([place]), Diabetic peripheral neuropathy ([place]), Duodenal ulcer, High cholesterol, Hypertension, Nephrotic syndrome, and Type II diabetes mellitus ([place]).  has a past surgical history that includes Cesarean section; Tubal ligation; Esophagogastroduodenoscopy (egd) with propofol (N/A, 11/14/2017); Colonoscopy with propofol (N/A, 11/14/2017); and Cardiac catheterization (2018). Prior to Admission medications   Medication Sig Start Date End Date Taking?  Authorizing Provider  acetaminophen (TYLENOL) 325 MG tablet Take 650 mg by mouth every 4 (four) hours as needed for mild pain.    [provider]  aluminum-magnesium hydroxide-simethicone (MAALOX) 200-200-20 MG/5ML SUSP Take 60 mLs by mouth 4 (four) times daily -  before meals and at bedtime.    [provider]  Amino Acids-Protein Hydrolys (FEEDING SUPPLEMENT, PRO-STAT SUGAR FREE 64,) LIQD Take 30 mLs by mouth 2 (two) times daily. 11/05/17   Colbert Ewing, MD  clopidogrel (PLAVIX) 75 MG tablet Take 75 mg by mouth daily.    [provider]  ezetimibe (ZETIA) 10 MG tablet Take 10 mg by mouth daily.    [provider]  feeding supplement, GLUCERNA SHAKE, (GLUCERNA SHAKE) LIQD Take 237 mLs by mouth 2 (two) times daily between meals. 11/05/17   Colbert Ewing, MD  Hydrocortisone (GERHARDT'S BUTT CREAM) CREA Apply 1 application topically 4 (four) times daily. 11/05/17   Colbert Ewing, MD  insulin aspart (NOVOLOG) 100 UNIT/ML injection Inject 0-10 Units into the skin See admin instructions. If blood sugar is <70 call MD, 71-250=0, 251-300=2u, 301-350=4,351-400=6u, 401-500=8u, >500=10u and call MD    [provider]  insulin glargine (LANTUS) 100 UNIT/ML injection Inject 5 Units into the skin at bedtime.    [provider]  isosorbide mononitrate (IMDUR) 60 MG 24 hr tablet Take 60 mg by mouth daily.    [provider]  metoprolol tartrate (LOPRESSOR) 50 MG tablet Take 50 mg by mouth 2 (two) times daily.    [provider]  mirtazapine (REMERON) 15 MG tablet Take 15 mg by mouth at bedtime.    [provider]  NIFEdipine (PROCARDIA-XL/ADALAT-CC/NIFEDICAL-XL) 30 MG 24 hr tablet Take 30 mg by  mouth 2 (two) times daily.    [provider]  pantoprazole (PROTONIX) 40 MG tablet Take 40 mg by mouth daily.    [provider]  vancomycin (VANCOCIN) 50 mg/mL oral solution Take 2.5 mLs (125 mg total) by mouth every 6 (six)  hours. 11/05/17   Colbert Ewing, MD  venlafaxine XR (EFFEXOR-XR) 150 MG 24 hr capsule Take 150 mg by mouth daily with breakfast.    [provider]   Allergies  Allergen Reactions  . Aspirin Hives  . Gabapentin     Excessive sedation (slept for 2 days)  . Statins Other (See Comments)    Joint pain    FAMILY HISTORY:  family history includes Diabetes in her father. SOCIAL HISTORY:  reports that  has never smoked. she has never used smokeless tobacco. She reports that she does not drink alcohol or use drugs.  REVIEW OF SYSTEMS:   As per HPI - All other systems reviewed and were neg.    SUBJECTIVE:   VITAL SIGNS: Temp:  [97.8 F (36.6 C)] 97.8 F (36.6 C) (02/20 0356) Pulse Rate:  [85-90] 85 (02/20 1414) Resp:  [18] 18 (02/20 0356) BP: (134-161)/(64-78) 134/64 (02/20 1414) SpO2:  [90 %-98 %] 93 % (02/20 1414)  PHYSICAL EXAMINATION: General:  Chronically ill appearing female, NAD in bed  Neuro:  Awake, alert, fairly appropriate, significant generalized weakness  HEENT: Mucous membranes moist, no JVD Cardiovascular: S1-S2, RRR Lungs: Respirations are even and nonlabored on room air, few bibasilar crackles otherwise essentially clear Abdomen: Round, soft, nontender Musculoskeletal: Warm and dry, 2+ pitting BLE edema, LLE slightly larger than RLE, no tenderness, redness, negative Homans  Recent Labs  Lab 11/22/17 0943 11/23/17 0451 11/24/17 0540  NA 138 139 140  K 4.1 4.1 3.6  CL 110 110 110  CO2 17* 19* 19*  BUN 81* 83* 87*  CREATININE 2.86* 2.82* 2.93*  GLUCOSE 80 83 80   Recent Labs  Lab 11/19/17 0738 11/22/17 0943 11/24/17 0540  HGB 11.4* 11.8* 10.7*  HCT 35.5* 35.6* 32.4*  WBC 7.3 8.9 7.9  PLT 430* 414* 375   Dg Chest Port 1 View  Result Date: 11/24/2017 CLINICAL DATA:  Shortness of Breath EXAM: PORTABLE CHEST 1 VIEW COMPARISON:  November 22, 2017 FINDINGS: There is persistent airspace consolidation in the left lower lobe with small left  pleural effusion. There is patchy airspace opacity throughout the right mid lower lung zones, a new finding. There is a small loculated effusion on the right. There is cardiomegaly with pulmonary venous hypertension. No adenopathy evident. There is degenerative change in each shoulder. IMPRESSION: 1. New airspace opacity felt to represent pneumonia in the right mid and lower lung zones. Small loculated pleural effusion on the right. 2. Persistent airspace consolidation left lower lobe with small left pleural effusion. 3. Underlying pulmonary vascular congestion with cardiomegaly and pulmonary venous hypertension. Electronically Signed   By: Lowella Grip III M.D.   On: 11/24/2017 11:53    ASSESSMENT / PLAN:  Syncope Dyspnea Abnormal chest x-ray  Suspect her infiltrates are more likely to represent asymmetric edema given clinical suspicion for volume overload in the setting of acute on CKD 4.  Could be some low-level suspicion for PE given syncope this morning.  She is on Lovenox for DVT prophylaxis but unsure if she was on prophylaxis while at SNF.  No fevers, no leukocytosis, no cough, no purulent sputum.  Doubt infectious etiology at this time.  Plan- Ongoing gentle diuresis as renal  function tolerates Renal following Mobilize/pulmonary hygiene-continue rehab efforts Would hold off on antibiotics for now especially given recent history C. difficile colitis Will order BLE venous Dopplers for completeness given syncope and low level suspicion of PE Hold off on CTA chest for now given renal disease Follow-up chest x-ray intermittently   Nickolas Madrid, NP 11/24/2017  2:38 PM Pager: [phone] or [phone]  Attending:  I have seen and examined the patient with nurse practitioner/resident and agree with the note above.  We formulated the plan together and I elicited the following history.    57 year old female who has struggled with C. difficile, has been hospitalized for a  prolonged period of time recently had a UTI and is profoundly deconditioned.  Pulmonary and critical care medicine was consulted for dyspnea and a questionable syncopal episode.  Yesterday she was given diuretics and she says she is now breathing better.  She denies fevers chills or cough.  No mucus production.  On exam: Lungs are clear to auscultation with the exception of some crackles in the bases normal effort Regular rate and rhythm no murmurs gallops or rubs Some edema still and elbows   Chest x-ray images reviewed showing bilateral lower lobe airspace disease and pleural effusions, cardiomegaly  Impression: Pulmonary edema Volume overload Cardiomegaly Mild dyspnea  Discussion: Agree with Lasix Consider echocardiogram  Pulmonary and critical care medicine will sign off  Roselie Awkward, MD [place] P[facility]M Pager: [phone] Cell: [phone] After 3pm or if no response, call [phone]

## 2017-11-24 NOTE — Progress Notes (Signed)
Occupational Therapy Session Note  Patient Details  Name: Laura [name] MRN: [id number] Date of Birth: 1960/12/16  Today's Date: 11/24/2017 OT Individual Time: 1000-1100 OT Individual Time Calculation (min): 60 min    Short Term Goals: Week 3:  OT Short Term Goal 1 (Week 3): STG = LTGs due to remaining LOS  Skilled Therapeutic Interventions/Progress Updates:    Treatment session with focus on family education with pt's husband.  Engaged in [name] bathing/dressing at bed level for energy conservation and decreased burden of care for caregiver.  Educated on rationale for bed level LB bathing/dressing.  Pt's husband asking questions about a "shower bay", discussed decreased endurance and recommendation for bed level bathing at current time.  UB bathing/dressing completed seated at EOB with min guard for sitting balance and min assist to pull shirt over trunk.  Educated on use of drop arm BSC for toileting, recommending use of slide board to decrease burden of care and increase pt participation.  Max cues for pt positioning secondary to pt tendency to lean backwards during transfers.  Total assist transfers secondary to fatigue and body positioning.  Educated on proper placement for slide board.  Pt's husband setup slide board for transfer to w/c, min cues for proper setup.  During transfer pt leaning back causing pt's husband to just pick her up at the hips and place her in the chair while therapist assisted with guiding hips.  Educated on proper body mechanics for caregiver, to minimize total assist lifting, to decrease injury as pt will require frequent transfers.  Max cues for breathing technique as pt visibly fatigued and SOB post transfers.    Therapy Documentation Precautions:  Precautions Precautions: Fall Precaution Comments: monitor blood pressure Restrictions Weight Bearing Restrictions: No General: General PT Missed Treatment Reason: Patient ill (Comment);MD hold (Comment)(medical  event) Vital Signs: Therapy Vitals Pulse Rate: 88 BP: (!) 144/74 Patient Position (if appropriate): Lying Oxygen Therapy SpO2: 90 % O2 Device: Not Delivered Pain: Pain Assessment Pain Assessment: No/denies pain  See Function Navigator for Current Functional Status.   Therapy/Group: Individual Therapy  [name] 11/24/2017, 12:48 PM

## 2017-11-24 NOTE — Progress Notes (Signed)
S:  Had ?syncope during therapy tody No c/o currently, lying flat in bed on RA w/o dyspnea Cont on lasix Labs stable   O:BP (!) 144/74 (BP Location: Left Arm)   Pulse 88   Temp 97.8 F (36.6 C) (Oral)   Resp 18   Ht 4' 11" (1.499 m)   Wt 57.4 kg (126 lb 8.7 oz)   SpO2 90%   BMI 25.56 kg/m   Intake/Output Summary (Last 24 hours) at 11/24/2017 1304 Last data filed at 11/24/2017 0815 Gross per 24 hour  Intake 360 ml  Output 1100 ml  Net -740 ml   Intake/Output: I/O last 3 completed shifts: In: 740 [P.O.:740] Out: 1900 [Urine:1900]  Intake/Output this shift:  Total I/O In: 120 [P.O.:120] Out: -  Weight change:  Gen: NAD CVS: no rub Resp: cta Abd: benign Ext: 3+ edema b/l  Recent Labs  Lab 11/18/17 1110 11/19/17 0738 11/20/17 0548 11/21/17 0719 11/22/17 0943 11/23/17 0451 11/24/17 0540  NA 141 139 139 140 138 139 140  K 4.1 4.1 4.0 4.2 4.1 4.1 3.6  CL 111 108 109 109 110 110 110  CO2 19* 18* 19* 19* 17* 19* 19*  GLUCOSE 129* 112* 92 104* 80 83 80  BUN 78* 80* 77* 82* 81* 83* 87*  CREATININE 2.63* 2.68* 2.72* 2.73* 2.86* 2.82* 2.93*  ALBUMIN 2.4* 2.2*  --   --  2.4* 2.2* 2.2*  CALCIUM 8.7* 8.5* 8.5* 8.4* 8.4* 8.5* 8.5*  PHOS 5.0* 5.2*  --   --  5.4* 5.6* 5.4*   Liver Function Tests: Recent Labs  Lab 11/22/17 0943 11/23/17 0451 11/24/17 0540  ALBUMIN 2.4* 2.2* 2.2*   No results for input(s): LIPASE, AMYLASE in the last 168 hours. No results for input(s): AMMONIA in the last 168 hours. CBC: Recent Labs  Lab 11/19/17 0738 11/22/17 0943 11/24/17 0540  WBC 7.3 8.9 7.9  NEUTROABS 4.2 5.1 3.8  HGB 11.4* 11.8* 10.7*  HCT 35.5* 35.6* 32.4*  MCV 86.6 87.5 86.4  PLT 430* 414* 375   Cardiac Enzymes: Recent Labs  Lab 11/24/17 1138  TROPONINI <0.03   CBG: Recent Labs  Lab 11/23/17 1131 11/23/17 1721 11/23/17 2103 11/24/17 0626 11/24/17 1117  GLUCAP 99 81 75 112* 100*    Iron Studies: No results for input(s): IRON, TIBC, TRANSFERRIN,  FERRITIN in the last 72 hours. Studies/Results: Dg Chest Port 1 View  Result Date: 11/24/2017 CLINICAL DATA:  Shortness of Breath EXAM: PORTABLE CHEST 1 VIEW COMPARISON:  November 22, 2017 FINDINGS: There is persistent airspace consolidation in the left lower lobe with small left pleural effusion. There is patchy airspace opacity throughout the right mid lower lung zones, a new finding. There is a small loculated effusion on the right. There is cardiomegaly with pulmonary venous hypertension. No adenopathy evident. There is degenerative change in each shoulder. IMPRESSION: 1. New airspace opacity felt to represent pneumonia in the right mid and lower lung zones. Small loculated pleural effusion on the right. 2. Persistent airspace consolidation left lower lobe with small left pleural effusion. 3. Underlying pulmonary vascular congestion with cardiomegaly and pulmonary venous hypertension. Electronically Signed   By: Lowella Grip III M.D.   On: 11/24/2017 11:53   . collagenase   Topical BID  . enoxaparin (LOVENOX) injection  30 mg Subcutaneous Q24H  . ezetimibe  10 mg Oral Daily  . feeding supplement (NEPRO CARB STEADY)  237 mL Oral TID WC  . feeding supplement (PRO-STAT SUGAR FREE 64)  30 mL  Oral BID  . [START ON 11/25/2017] furosemide  40 mg Intravenous Q12H  . hydrALAZINE  25 mg Oral Q8H  . insulin aspart  0-9 Units Subcutaneous TID WC  . isosorbide mononitrate  90 mg Oral Daily  . metoprolol tartrate  100 mg Oral BID  . mirtazapine  15 mg Oral QHS  . multivitamin  1 tablet Oral QHS  . NIFEdipine  60 mg Oral BID  . pantoprazole  40 mg Oral BID AC  . polycarbophil  1,250 mg Oral BID AC  . sodium bicarbonate  650 mg Oral BID  . venlafaxine XR  150 mg Oral Q breakfast    BMET    Component Value Date/Time   NA 140 11/24/2017 0540   K 3.6 11/24/2017 0540   CL 110 11/24/2017 0540   CO2 19 (L) 11/24/2017 0540   GLUCOSE 80 11/24/2017 0540   BUN 87 (H) 11/24/2017 0540   CREATININE 2.93  (H) 11/24/2017 0540   CALCIUM 8.5 (L) 11/24/2017 0540   GFRNONAA 17 (L) 11/24/2017 0540   GFRAA 20 (L) 11/24/2017 0540   CBC    Component Value Date/Time   WBC 7.9 11/24/2017 0540   RBC 3.75 (L) 11/24/2017 0540   HGB 10.7 (L) 11/24/2017 0540   HCT 32.4 (L) 11/24/2017 0540   PLT 375 11/24/2017 0540   MCV 86.4 11/24/2017 0540   MCH 28.5 11/24/2017 0540   MCHC 33.0 11/24/2017 0540   RDW 16.5 (H) 11/24/2017 0540   LYMPHSABS 3.4 11/24/2017 0540   MONOABS 0.5 11/24/2017 0540   EOSABS 0.2 11/24/2017 0540   BASOSABS 0.0 11/24/2017 0540      Assessment/Plan:  1. AKI/CKD stage 4- likley baseline diabetic nephropathy and worsening with acute health issues.  Labs oerall stable today.  2. ABLA due to GI Bleed and anemia of CKD stage 4- s/p transfusion 3. Normal AG Met Acidosis on NaHCO3 650 BID 4. Hyperkalemia  K in 4s --  5. CDI - s/p completion of po vancomycin for recent C Diff 6. HTN- stable 7. Severe deconditioning/debilitation- cont with PT/OT with inpatient rehab.  Continue Lasix.  Agree with holding [place] at the current time.  Continue daily renal function panel.Pearson Grippe MD [phone]

## 2017-11-24 NOTE — Progress Notes (Signed)
Physical Therapy Note  Patient Details  Name: Laura [name] MRN: [id number] Date of Birth: April 18, 1961 Today's Date: 11/24/2017    Time: 1100-1120 20 minutes  1:1 NO c/o pain.  Pt seen with husband present for pt/family education.  Pt's leg rests adjusted for improved positioning and pt's husband educated on goals of w/c seating for improved posture and efficiency with w/c propulsion.  Simulated car transfer to sedan height pt PT performing and husband observing pt mod/max A for sliding board to/from car. As pt exits car she became unresponsive with eyes closed, RN and PA made aware and nursing performed rapid response. Pt missed final 40 minutes of treatment.   Marquiz Sotelo 11/24/2017, 11:34 AM

## 2017-11-24 NOTE — Consult Note (Signed)
Date: 11/24/2017               Patient Name:  Laura [name] MRN: [id number]  DOB: 24-Nov-1960 Age / Sex: 57 y.o., female   PCP: System, Pcp Not In         Requesting Physician: Dr. Posey Pronto, Domenick Bookbinder, MD    Consulting Reason:  Syncopal Episode     Chief Complaint: Syncopal Episode  History of Present Illness: Laura [name] is a 57 yo F with a history of DM (Insulin dependent), HFrEF, and Cognitive delay admitted to [facility] from 1/26 - 2/9 with Hypoglycemia, UTI, A/C Renal Failure, Deconditioning, and Diarrhea. She was treated with PO Vancomycin for her C. Diff + diarrhea; Received rocephin andindwelling catheter was exchanged due to her UTI; was given gentle IVF fluids, which improved her ARF; and was ultimately discharged to CIR for rehabilitation due to her deconditioning.  On 2/20 she experienced a syncopal event during physical therapy in CIR. She states that she was practicing transferring from her wheelchair to a car when she noticed she felt funny and began to experience tunnel vision. She states that she was then helped back into he wheel chair where she briefly passed out. She states she loss consciousness only briefly and was able to communicate when she awoke. She endorsed feeling as though her heart was racing just prior to syncopal episode. She states that she was feeling some dyspnea but she attributed it to the on going physical therapy session at that time. She additionally endorsed holding her breath while transferring as she was bracing herself for pain that she experiences with transfers due to a wound on her sacrum. She states she has felt normal since the event with no recurrence of symptoms. She denies any recent chest pain or recent dark stools.  Recent labs are without significant electrolyte abnormality. She has mild NAGMA and Elevated BUN and Cr consistent with her CKD. AM CBC shows Hgb stable at 8.7 and no leukocytosis. Patient has been normo to hypertensive in CIR and has been  receiving increasing doses of lasix recently due to volume overload in the setting of CKD.  Internal Medicine consulted to evaluate patient's syncopal episode.  Meds: Current Facility-Administered Medications  Medication Dose Route Frequency Provider Last Rate Last Dose  . acetaminophen (TYLENOL) tablet 325-650 mg  325-650 mg Oral Q4H PRN Bary Leriche, PA-C   650 mg at 11/16/17 0933  . ALPRAZolam Duanne Moron) tablet 0.25 mg  0.25 mg Oral TID PRN Meredith Staggers, MD   0.25 mg at 11/23/17 2156  . alum & mag hydroxide-simeth (MAALOX/MYLANTA) 200-200-20 MG/5ML suspension 30 mL  30 mL Oral Q4H PRN Love, Pamela S, PA-C      . bisacodyl (DULCOLAX) suppository 10 mg  10 mg Rectal Daily PRN Love, Pamela S, PA-C      . collagenase (SANTYL) ointment   Topical BID Love, Pamela S, PA-C      . diphenhydrAMINE (BENADRYL) 12.5 MG/5ML elixir 12.5-25 mg  12.5-25 mg Oral Q6H PRN Love, Pamela S, PA-C      . enoxaparin (LOVENOX) injection 30 mg  30 mg Subcutaneous Q24H Love, Pamela S, PA-C   30 mg at 11/23/17 2159  . ezetimibe (ZETIA) tablet 10 mg  10 mg Oral Daily Bary Leriche, PA-C   10 mg at 11/24/17 0932  . feeding supplement (NEPRO CARB STEADY) liquid 237 mL  237 mL Oral TID WC LoveIvan Anchors, PA-C   237 mL at 11/24/17 1147  .  feeding supplement (PRO-STAT SUGAR FREE 64) liquid 30 mL  30 mL Oral BID Bary Leriche, PA-C   30 mL at 11/24/17 0816  . [START ON 11/25/2017] furosemide (LASIX) injection 40 mg  40 mg Intravenous Q12H Love, Pamela S, PA-C      . guaiFENesin-dextromethorphan (ROBITUSSIN DM) 100-10 MG/5ML syrup 5-10 mL  5-10 mL Oral Q6H PRN Love, Pamela S, PA-C      . hydrALAZINE (APRESOLINE) tablet 25 mg  25 mg Oral Q8H [name], MD   25 mg at 11/24/17 0636  . insulin aspart (novoLOG) injection 0-9 Units  0-9 Units Subcutaneous TID WC Bary Leriche, PA-C   1 Units at 11/22/17 1850  . isosorbide mononitrate (IMDUR) 24 hr tablet 90 mg  90 mg Oral Daily [name], MD   90 mg at 11/24/17 0816   . lip balm (BLISTEX) ointment   Topical PRN [name], MD      . methocarbamol (ROBAXIN) tablet 500 mg  500 mg Oral QID PRN Bary Leriche, PA-C   500 mg at 11/11/17 1926  . metoprolol tartrate (LOPRESSOR) tablet 100 mg  100 mg Oral BID Charlett Blake, MD   100 mg at 11/24/17 0816  . mirtazapine (REMERON) tablet 15 mg  15 mg Oral QHS Bary Leriche, PA-C   15 mg at 11/23/17 2156  . multivitamin (RENA-VIT) tablet 1 tablet  1 tablet Oral QHS Bary Leriche, PA-C   1 tablet at 11/23/17 2155  . NIFEdipine (PROCARDIA-XL/ADALAT CC) 24 hr tablet 60 mg  60 mg Oral BID [name], MD   60 mg at 11/24/17 0816  . pantoprazole (PROTONIX) EC tablet 40 mg  40 mg Oral BID AC Gatha Mayer, MD   40 mg at 11/24/17 0631  . polycarbophil (FIBERCON) tablet 1,250 mg  1,250 mg Oral BID AC LoveIvan Anchors, PA-C   1,250 mg at 11/24/17 1204  . polyethylene glycol (MIRALAX / GLYCOLAX) packet 17 g  17 g Oral Daily PRN Love, Pamela S, PA-C      . potassium chloride SA (K-DUR,KLOR-CON) CR tablet 40 mEq  40 mEq Oral Daily Posey Pronto, Domenick Bookbinder, MD      . prochlorperazine (COMPAZINE) tablet 5-10 mg  5-10 mg Oral Q6H PRN Bary Leriche, PA-C   5 mg at 11/15/17 1006   Or  . prochlorperazine (COMPAZINE) injection 5-10 mg  5-10 mg Intramuscular Q6H PRN Love, Pamela S, PA-C       Or  . prochlorperazine (COMPAZINE) suppository 12.5 mg  12.5 mg Rectal Q6H PRN Love, Pamela S, PA-C      . sodium bicarbonate tablet 650 mg  650 mg Oral BID Elmarie Shiley, MD   650 mg at 11/24/17 0816  . sodium phosphate (FLEET) 7-19 GM/118ML enema 1 enema  1 enema Rectal Daily PRN Love, Pamela S, PA-C      . traZODone (DESYREL) tablet 25-50 mg  25-50 mg Oral QHS PRN Bary Leriche, PA-C   50 mg at 11/10/17 2343  . venlafaxine XR (EFFEXOR-XR) 24 hr capsule 150 mg  150 mg Oral Q breakfast Bary Leriche, PA-C   150 mg at 11/24/17 0277    Allergies: Allergies as of 11/05/2017 - Review Complete 11/05/2017  Allergen Reaction Noted  .  Aspirin Hives 10/30/2017  . Statins Other (See Comments) 11/05/2017   Past Medical History:  Diagnosis Date  . C. difficile colitis   . CAD (coronary artery disease)   .  CHF (congestive heart failure) ([place])   . Chronic bronchitis ([place])   . Diabetic peripheral neuropathy ([place])   . Duodenal ulcer   . High cholesterol   . Hypertension   . Nephrotic syndrome   . Type II diabetes mellitus ([place])    Past Surgical History:  Procedure Laterality Date  . CARDIAC CATHETERIZATION  2018  . CESAREAN SECTION    . COLONOSCOPY WITH PROPOFOL N/A 11/14/2017   Procedure: COLONOSCOPY WITH PROPOFOL;  Surgeon: Yetta Flock, MD;  Location: [facility];  Service: Gastroenterology;  Laterality: N/A;  . ESOPHAGOGASTRODUODENOSCOPY (EGD) WITH PROPOFOL N/A 11/14/2017   Procedure: ESOPHAGOGASTRODUODENOSCOPY (EGD) WITH PROPOFOL;  Surgeon: Yetta Flock, MD;  Location: [facility];  Service: Gastroenterology;  Laterality: N/A;  . TUBAL LIGATION     Family History  Problem Relation Age of Onset  . Diabetes Father    Social History   Socioeconomic History  . Marital status: Married    Spouse name: Not on file  . Number of children: Not on file  . Years of education: Not on file  . Highest education level: Not on file  Social Needs  . Financial resource strain: Not hard at all  . Food insecurity - worry: Never true  . Food insecurity - inability: Not on file  . Transportation needs - medical: No  . Transportation needs - non-medical: No  Occupational History  . Not on file  Tobacco Use  . Smoking status: Never Smoker  . Smokeless tobacco: Never Used  Substance and Sexual Activity  . Alcohol use: No    Frequency: Never  . Drug use: No  . Sexual activity: No  Other Topics Concern  . Not on file  Social History Narrative  . Not on file    Review of Systems: A complete review of symptoms was negative except as per HPI.  Physical Exam: Blood pressure (!) 144/74, pulse 88,  temperature 97.8 F (36.6 C), temperature source Oral, resp. rate 18, height 4\' 11"  (1.499 m), weight 126 lb 8.7 oz (57.4 kg), SpO2 90 %. Physical Exam  Constitutional: She appears well-developed and well-nourished.  Wheel chair bound  HENT:  Head: Normocephalic and atraumatic.  Eyes: EOM are normal. Right eye exhibits no discharge. Left eye exhibits no discharge.  Cardiovascular: Normal rate, regular rhythm, normal heart sounds and intact distal pulses.  Pulmonary/Chest: Effort normal and breath sounds normal. No respiratory distress.  Abdominal: Soft. Bowel sounds are normal. She exhibits no distension. There is no tenderness.  Musculoskeletal: She exhibits no tenderness.  @-3+ Pitting edema Bilateral LEs  Neurological: She is alert.  Skin: Skin is warm and dry.   Lab results: BMP Latest Ref Rng & Units 11/24/2017 11/23/2017 11/22/2017  Glucose 65 - 99 mg/dL 80 83 80  BUN 6 - 20 mg/dL 87(H) 83(H) 81(H)  Creatinine 0.44 - 1.00 mg/dL 2.93(H) 2.82(H) 2.86(H)  Sodium 135 - 145 mmol/L 140 139 138  Potassium 3.5 - 5.1 mmol/L 3.6 4.1 4.1  Chloride 101 - 111 mmol/L 110 110 110  CO2 22 - 32 mmol/L 19(L) 19(L) 17(L)  Calcium 8.9 - 10.3 mg/dL 8.5(L) 8.5(L) 8.4(L)   CBC Latest Ref Rng & Units 11/24/2017 11/22/2017 11/19/2017  WBC 4.0 - 10.5 K/uL 7.9 8.9 7.3  Hemoglobin 12.0 - 15.0 g/dL 10.7(L) 11.8(L) 11.4(L)  Hematocrit 36.0 - 46.0 % 32.4(L) 35.6(L) 35.5(L)  Platelets 150 - 400 K/uL 375 414(H) 430(H)   Imaging results:  Dg Chest Port 1 View  Result Date: 11/24/2017 CLINICAL  DATA:  Shortness of Breath EXAM: PORTABLE CHEST 1 VIEW COMPARISON:  November 22, 2017 FINDINGS: There is persistent airspace consolidation in the left lower lobe with small left pleural effusion. There is patchy airspace opacity throughout the right mid lower lung zones, a new finding. There is a small loculated effusion on the right. There is cardiomegaly with pulmonary venous hypertension. No adenopathy evident. There is  degenerative change in each shoulder. IMPRESSION: 1. New airspace opacity felt to represent pneumonia in the right mid and lower lung zones. Small loculated pleural effusion on the right. 2. Persistent airspace consolidation left lower lobe with small left pleural effusion. 3. Underlying pulmonary vascular congestion with cardiomegaly and pulmonary venous hypertension. Electronically Signed   By: Lowella Grip III M.D.   On: 11/24/2017 11:53    Other results: EKG: there are no previous tracings available for comparison, normal sinus rhythm, nonspecific ST and T waves changes, prolonged QT interval.  Assessment, Plan, & Recommendations by Problem:  Syncope and Collapse: Brief syncopal episode while transferring and holding her breath with associated prodrome of tunnel vision and increased heart rate, and feeling like she was going to pass out is consistent with vasovagal episode. Will obtain orthostatic vitals (laying and sitting as patient cannot stand and was not standing during event) to rule out any component of orthostatic hypotension in this patient on lasix. No palpitiations or significant increase in shortness of breath. EKG without arrhythmia or ischemic changes. Troponin negative. Low suspicion for any arrhythmogenic cause or PE. Pulmonology also consulted and are obtaining LE dopplers for completeness (No unilateral swelling or tenderness noted on my exam). Patient has 5 duodenal ulcer Identified on EGD 2/10, but no signs of bleeding and stable Hgb this AM. - Suspect vasovagal epsiode - Orthostatic vital signs - H/H to verify stable Hgb with recent duodenal ulcers  Thank you for including [place] in the care of this patient. Provided the above comes back negative, Internal Medicine to sign off at this time. Please contact us with further questions.  Signed: Neva Seat, MD 11/24/2017, 1:43 PM

## 2017-11-25 ENCOUNTER — Inpatient Hospital Stay (HOSPITAL_COMMUNITY): Payer: No Typology Code available for payment source | Admitting: Speech Pathology

## 2017-11-25 ENCOUNTER — Inpatient Hospital Stay (HOSPITAL_COMMUNITY)
Admission: RE | Admit: 2017-11-25 | Discharge: 2017-11-25 | Disposition: A | Discharge status: Home / Self Care | Payer: No Typology Code available for payment source | Source: Intra-hospital | Attending: Adult Health | Admitting: Adult Health

## 2017-11-25 ENCOUNTER — Inpatient Hospital Stay (HOSPITAL_COMMUNITY): Payer: No Typology Code available for payment source | Admitting: Occupational Therapy

## 2017-11-25 ENCOUNTER — Inpatient Hospital Stay (HOSPITAL_COMMUNITY): Payer: No Typology Code available for payment source | Admitting: Physical Therapy

## 2017-11-25 ENCOUNTER — Ambulatory Visit (HOSPITAL_COMMUNITY): Payer: No Typology Code available for payment source

## 2017-11-25 DIAGNOSIS — M7989 Other specified soft tissue disorders: Secondary | ICD-10-CM

## 2017-11-25 DIAGNOSIS — R55 Syncope and collapse: Secondary | ICD-10-CM | POA: Insufficient documentation

## 2017-11-25 DIAGNOSIS — R9389 Abnormal findings on diagnostic imaging of other specified body structures: Secondary | ICD-10-CM | POA: Insufficient documentation

## 2017-11-25 DIAGNOSIS — R1030 Lower abdominal pain, unspecified: Secondary | ICD-10-CM

## 2017-11-25 DIAGNOSIS — Z7401 Bed confinement status: Secondary | ICD-10-CM

## 2017-11-25 DIAGNOSIS — Z993 Dependence on wheelchair: Secondary | ICD-10-CM

## 2017-11-25 DIAGNOSIS — Z87440 Personal history of urinary (tract) infections: Secondary | ICD-10-CM

## 2017-11-25 LAB — RENAL FUNCTION PANEL
ALBUMIN: 2.3 g/dL — AB (ref 3.5–5.0)
Anion gap: 12 (ref 5–15)
BUN: 86 mg/dL — AB (ref 6–20)
CALCIUM: 8.6 mg/dL — AB (ref 8.9–10.3)
CO2: 17 mmol/L — AB (ref 22–32)
Chloride: 112 mmol/L — ABNORMAL HIGH (ref 101–111)
Creatinine, Ser: 2.96 mg/dL — ABNORMAL HIGH (ref 0.44–1.00)
GFR calc Af Amer: 19 mL/min — ABNORMAL LOW (ref >60)
GFR calc non Af Amer: 17 mL/min — ABNORMAL LOW (ref >60)
GLUCOSE: 82 mg/dL (ref 65–99)
PHOSPHORUS: 5.2 mg/dL — AB (ref 2.5–4.6)
Potassium: 4.1 mmol/L (ref 3.5–5.1)
Sodium: 141 mmol/L (ref 135–145)

## 2017-11-25 LAB — GLUCOSE, CAPILLARY
GLUCOSE-CAPILLARY: 99 mg/dL (ref 65–99)
GLUCOSE-CAPILLARY: 98 mg/dL (ref 65–99)
Glucose-Capillary: 73 mg/dL (ref 65–99)
Glucose-Capillary: 123 mg/dL — ABNORMAL HIGH (ref 65–99)
Glucose-Capillary: 134 mg/dL — ABNORMAL HIGH (ref 65–99)

## 2017-11-25 LAB — CBC
HCT: 33.8 % — ABNORMAL LOW (ref 36.0–46.0)
Hemoglobin: 10.9 g/dL — ABNORMAL LOW (ref 12.0–15.0)
MCH: 27.9 pg (ref 26.0–34.0)
MCHC: 32.2 g/dL (ref 30.0–36.0)
MCV: 86.4 fL (ref 78.0–100.0)
Platelets: 362 10*3/uL (ref 150–400)
RBC: 3.91 MIL/uL (ref 3.87–5.11)
RDW: 16.7 % — ABNORMAL HIGH (ref 11.5–15.5)
WBC: 11.4 10*3/uL — ABNORMAL HIGH (ref 4.0–10.5)

## 2017-11-25 LAB — TROPONIN I

## 2017-11-25 NOTE — Progress Notes (Signed)
S:  Working in gym, no c/o No further syncope ? 7L UOP yesterday? Stable SCr  O:BP (!) 145/65 (BP Location: Left Arm)   Pulse 80   Temp 98 F (36.7 C) (Oral)   Resp 18   Ht _0  (1.499 m)   Wt 57.4 kg (126 lb 8.7 oz)   SpO2 97%   BMI 25.56 kg/m   Intake/Output Summary (Last 24 hours) at 11/25/2017 1203 Last data filed at 11/25/2017 0809 Gross per 24 hour  Intake 160 ml  Output 6850 ml  Net -6690 ml   Intake/Output: I/O last 3 completed shifts: In: 280 [P.O.:280] Out: 7600 [Urine:7600]  Intake/Output this shift:  Total I/O In: -  Out: 350 [Urine:350] Weight change:  Gen: NAD CVS: no rub Resp: cta Abd: benign Ext: 3+ edema b/l  Recent Labs  Lab 11/19/17 0738 11/20/17 0548 11/21/17 0719 11/22/17 0943 11/23/17 0451 11/24/17 0540 11/25/17 0457  NA 139 139 140 138 139 140 141  K 4.1 4.0 4.2 4.1 4.1 3.6 4.1  CL 108 109 109 110 110 110 112*  CO2 18* 19* 19* 17* 19* 19* 17*  GLUCOSE 112* 92 104* 80 83 80 82  BUN 80* 77* 82* 81* 83* 87* 86*  CREATININE 2.68* 2.72* 2.73* 2.86* 2.82* 2.93* 2.96*  ALBUMIN 2.2*  --   --  2.4* 2.2* 2.2* 2.3*  CALCIUM 8.5* 8.5* 8.4* 8.4* 8.5* 8.5* 8.6*  PHOS 5.2*  --   --  5.4* 5.6* 5.4* 5.2*   Liver Function Tests: Recent Labs  Lab 11/23/17 0451 11/24/17 0540 11/25/17 0457  ALBUMIN 2.2* 2.2* 2.3*   No results for input(s): LIPASE, AMYLASE in the last 168 hours. No results for input(s): AMMONIA in the last 168 hours. CBC: Recent Labs  Lab 11/19/17 0738 11/22/17 0943 11/24/17 0540 11/24/17 1719 11/25/17 0457  WBC 7.3 8.9 7.9  --  11.4*  NEUTROABS 4.2 5.1 3.8  --   --   HGB 11.4* 11.8* 10.7* 11.0* 10.9*  HCT 35.5* 35.6* 32.4* 33.6* 33.8*  MCV 86.6 87.5 86.4  --  86.4  PLT 430* 414* 375  --  362   Cardiac Enzymes: Recent Labs  Lab 11/24/17 1138 11/24/17 1657 11/24/17 2328  TROPONINI <0.03 <0.03 <0.03   CBG: Recent Labs  Lab 11/24/17 1636 11/24/17 2118 11/25/17 0644 11/25/17 0706 11/25/17 1142  GLUCAP 88  95 73 99 98    Iron Studies: No results for input(s): IRON, TIBC, TRANSFERRIN, FERRITIN in the last 72 hours. Studies/Results: Dg Chest Port 1 View  Result Date: 11/24/2017 CLINICAL DATA:  Shortness of Breath EXAM: PORTABLE CHEST 1 VIEW COMPARISON:  November 22, 2017 FINDINGS: There is persistent airspace consolidation in the left lower lobe with small left pleural effusion. There is patchy airspace opacity throughout the right mid lower lung zones, a new finding. There is a small loculated effusion on the right. There is cardiomegaly with pulmonary venous hypertension. No adenopathy evident. There is degenerative change in each shoulder. IMPRESSION: 1. New airspace opacity felt to represent pneumonia in the right mid and lower lung zones. Small loculated pleural effusion on the right. 2. Persistent airspace consolidation left lower lobe with small left pleural effusion. 3. Underlying pulmonary vascular congestion with cardiomegaly and pulmonary venous hypertension. Electronically Signed   By: Lowella Grip III M.D.   On: 11/24/2017 11:53   . collagenase   Topical BID  . enoxaparin (LOVENOX) injection  30 mg Subcutaneous Q24H  . ezetimibe  10 mg Oral Daily  .  feeding supplement (NEPRO CARB STEADY)  237 mL Oral TID WC  . feeding supplement (PRO-STAT SUGAR FREE 64)  30 mL Oral BID  . furosemide  40 mg Intravenous Q12H  . hydrALAZINE  25 mg Oral Q8H  . insulin aspart  0-9 Units Subcutaneous TID WC  . isosorbide mononitrate  90 mg Oral Daily  . metoprolol tartrate  100 mg Oral BID  . mirtazapine  15 mg Oral QHS  . multivitamin  1 tablet Oral QHS  . NIFEdipine  60 mg Oral BID  . pantoprazole  40 mg Oral BID AC  . polycarbophil  1,250 mg Oral BID AC  . sodium bicarbonate  650 mg Oral BID  . venlafaxine XR  150 mg Oral Q breakfast    BMET    Component Value Date/Time   NA 141 11/25/2017 0457   K 4.1 11/25/2017 0457   CL 112 (H) 11/25/2017 0457   CO2 17 (L) 11/25/2017 0457   GLUCOSE 82  11/25/2017 0457   BUN 86 (H) 11/25/2017 0457   CREATININE 2.96 (H) 11/25/2017 0457   CALCIUM 8.6 (L) 11/25/2017 0457   GFRNONAA 17 (L) 11/25/2017 0457   GFRAA 19 (L) 11/25/2017 0457   CBC    Component Value Date/Time   WBC 11.4 (H) 11/25/2017 0457   RBC 3.91 11/25/2017 0457   HGB 10.9 (L) 11/25/2017 0457   HCT 33.8 (L) 11/25/2017 0457   PLT 362 11/25/2017 0457   MCV 86.4 11/25/2017 0457   MCH 27.9 11/25/2017 0457   MCHC 32.2 11/25/2017 0457   RDW 16.7 (H) 11/25/2017 0457   LYMPHSABS 3.4 11/24/2017 0540   MONOABS 0.5 11/24/2017 0540   EOSABS 0.2 11/24/2017 0540   BASOSABS 0.0 11/24/2017 0540      Assessment/Plan:  1. AKI/CKD stage 4- likley baseline diabetic nephropathy and worsening with acute health issues.  Labs oerall stable today.  2. ABLA due to GI Bleed and anemia of CKD stage 4- s/p transfusion 3. Normal AG Met Acidosis on NaHCO3 650 BID 4. Hyperkalemia  K in 4s -- resolved no binder needed  5. CDI - s/p completion of po vancomycin for recent C Diff 6. HTN- stable 7. Severe deconditioning/debilitation- cont with PT/OT with inpatient rehab.  Cont lasix, consider compression hose for LEE.    Pearson Grippe MD [phone]

## 2017-11-25 NOTE — Patient Care Conference (Signed)
Inpatient RehabilitationTeam Conference and Plan of Care Update Date: 11/24/2017   Time: 2:35 PM    Patient Name: Starbuck      Medical Record Number: [id number]  Date of Birth: Apr 12, 1961 Sex: Female         Room/Bed: 4M12C/4M12C-01 Payor Info: Payor: [employer] / Plan: [place] / Product Type: *No Product type* /    Admitting Diagnosis: [place] Debility  Admit Date/Time:  11/05/2017  5:50 PM Admission Comments: No comment available   Primary Diagnosis:  <principal problem not specified> Principal Problem: <principal problem not specified>  Patient Active Problem List   Diagnosis Date Noted  . Abnormal chest x-ray   . Vasovagal syncope   . Syncope and collapse   . Diarrhea   . SOB (shortness of breath)   . Hypertensive crisis   . Duodenal ulcer   . Acute lower UTI   . Gastrointestinal hemorrhage associated with duodenal ulcer   . Uncontrolled hypertension   . Anticoagulated   . Benign neoplasm of transverse colon   . Abnormal urinalysis   . Nephrotic syndrome   . Rectal bleeding   . Abnormal MRI   . Uncontrolled type 2 diabetes mellitus with hyperglycemia ([place])   . Diabetic peripheral neuropathy ([place])   . Weakness of both lower extremities   . Hyperkalemia   . Hypoglycemia   . Type 2 diabetes mellitus with complication, with long-term current use of insulin ([place])   . Neurogenic bladder   . Hypoalbuminemia due to protein-calorie malnutrition ([place])   . AKI (acute kidney injury) ([place])   . Stage 3 chronic kidney disease ([place])   . Physical debility 11/05/2017  . Critical illness myopathy   . Encephalopathy   . Diabetes mellitus type 2 in nonobese ([facility])   . Hyperlipidemia   . Cognitive impairment   . Benign essential HTN   . Chronic diastolic congestive heart failure ([place])   . Chronic kidney disease (CKD), stage IV (severe) ([place])   . Acute blood loss anemia   . Anemia   . Leukocytosis   . C. difficile colitis   . Urinary tract infection 10/30/2017   . Pressure injury of skin 10/30/2017    Expected Discharge Date: Expected Discharge Date: 11/27/17  Team Members Present: Physician leading conference: Dr. Delice Lesch Social Worker Present: Lennart Pall, LCSW Nurse Present: Frances Maywood, RN PT Present: Roderic Ovens, PT OT Present: [name], OT SLP Present: Windell Moulding, SLP PPS Coordinator present : Daiva Nakayama, RN, CRRN     Current Status/Progress Goal Weekly Team Focus  Medical   Functional and mobility deficits secondary to debility and encephalopathy from multiple medical issues  Improve mobility, safety, BP, neurogenix bladder, AKI, ?C.dif, eval for syncope  See above   Bowel/Bladder   incontient of bowel, in/out cath q8  manage bowel and bladder with mod assist  timed tolieting q 2, encourage patient to try to void before caths   Swallow/Nutrition/ Hydration             ADL's   Min assist UB bathing/dressing, +2 LB bathing/dressing at sit > stand level or even with lateral leans - recommend bed level for LB.  Mod A to +2 for slide board transfers  downgraded LB dressing to mod assist, transfers to Mod assist, Max assist toileting and d/c standing goal, supervision UB bathing/dressing/grooming  ADL retraining, activity tolerance, strengthening, transfers, pt/family education   Mobility   mod/max A sliding board, min A w/c  downgraded to mod  A transfers  family ed, d/c planning, transfers   Communication             Safety/Cognition/ Behavioral Observations  no change, mod-max assist for basic tasks, mentation fluctuates greatly   min assist, downgraded   continue to address orientation, recall of basic information, problem solving   Pain   no complaints of pain  pain < 3  Assess pain q shift and prn    Skin   unstageable to sacrum/ BID dressing santyl/moistened to dry guaze and tegderm  no new skin issues   Assess skin q shift and prn    Rehab Goals Patient on target to meet rehab goals: No Rehab Goals  Revised: anticipate downgrading of goals overall *See Care Plan and progress notes for long and short-term goals.     Barriers to Discharge  Current Status/Progress Possible Resolutions Date Resolved   Physician    Medical stability;Decreased caregiver support;Incontinence;Neurogenic Bowel & Bladder;Wound Care;Lack of/limited family support;Other (comments)  LE weakness  See above  Therapies, optimize BP meds, follow labs, Nephro/GI/ID/Teaching service recs, workup for syncope      Nursing                  PT                    OT                  SLP                SW Lack of/limited family support;Decreased caregiver support;Incontinence;Nutrition means   Continuing to talk with spouse about need for "hands on" education as he still reports the he and daughter will provide needed assistance.  Feel they are not realistic about the level of care pt needs and they are resistant to consider SNF          Discharge Planning/Teaching Needs:  Spouse aware that pt will require 24/7 mod assist w/c level care and MUST begin family ed.  He was in this morning to begin when pt had a syncopal event.  We discussed concern that he must have all caregivers complete education and, again, spoke with him about possible SNF.  Teaching begun this morning with spouse.   Team Discussion:  Syncopal event today.  MD requests "strict I and O's".  Still a max assist overall.  Husband was able to complete some "hands on" training today but often simply "carried" pt from one point to another.  PA able to have extensive conversation with husband about her complex medical picture and longer term care needs.  SW reports spouse now may be more receptive to SNF if insurance would approve.  Revisions to Treatment Plan:  None    Continued Need for Acute Rehabilitation Level of Care: The patient requires daily medical management by a physician with specialized training in physical medicine and rehabilitation for the  following conditions: Daily direction of a multidisciplinary physical rehabilitation program to ensure safe treatment while eliciting the highest outcome that is of practical value to the patient.: Yes Daily medical management of patient stability for increased activity during participation in an intensive rehabilitation regime.: Yes Daily analysis of laboratory values and/or radiology reports with any subsequent need for medication adjustment of medical intervention for : Diabetes problems;Wound care problems;Blood pressure problems;Urological problems;Renal problems;Other;Neurological problems;Mood/behavior problems  Ladavia Lindenbaum 11/25/2017, 12:07 PM

## 2017-11-25 NOTE — Progress Notes (Signed)
Subjective/Complaints: Patient seen lying in bed this morning. She slept well overnight. Nursing noted increased edema.  ROS: Ltd. due to cognition, but appears to deny CP, SOB, nausea, vomiting, diarrhea.  Objective: Vital Signs: Blood pressure (!) 145/65, pulse 80, temperature 98 F (36.7 C), temperature source Oral, resp. rate 18, height 4' 11" (1.499 m), weight 57.4 kg (126 lb 8.7 oz), SpO2 97 %. Dg Chest Port 1 View  Result Date: 11/24/2017 CLINICAL DATA:  Shortness of Breath EXAM: PORTABLE CHEST 1 VIEW COMPARISON:  November 22, 2017 FINDINGS: There is persistent airspace consolidation in the left lower lobe with small left pleural effusion. There is patchy airspace opacity throughout the right mid lower lung zones, a new finding. There is a small loculated effusion on the right. There is cardiomegaly with pulmonary venous hypertension. No adenopathy evident. There is degenerative change in each shoulder. IMPRESSION: 1. New airspace opacity felt to represent pneumonia in the right mid and lower lung zones. Small loculated pleural effusion on the right. 2. Persistent airspace consolidation left lower lobe with small left pleural effusion. 3. Underlying pulmonary vascular congestion with cardiomegaly and pulmonary venous hypertension. Electronically Signed   By: Lowella Grip III M.D.   On: 11/24/2017 11:53   Results for orders placed or performed during the hospital encounter of 11/05/17 (from the past 72 hour(s))  CBC with Differential/Platelet     Status: Abnormal   Collection Time: 11/22/17  9:43 AM  Result Value Ref Range   WBC 8.9 4.0 - 10.5 K/uL   RBC 4.07 3.87 - 5.11 MIL/uL   Hemoglobin 11.8 (L) 12.0 - 15.0 g/dL   HCT 35.6 (L) 36.0 - 46.0 %   MCV 87.5 78.0 - 100.0 fL   MCH 29.0 26.0 - 34.0 pg   MCHC 33.1 30.0 - 36.0 g/dL   RDW 16.4 (H) 11.5 - 15.5 %   Platelets 414 (H) 150 - 400 K/uL   Neutrophils Relative % 57 %   Neutro Abs 5.1 1.7 - 7.7 K/uL   Lymphocytes Relative 35 %    Lymphs Abs 3.1 0.7 - 4.0 K/uL   Monocytes Relative 4 %   Monocytes Absolute 0.4 0.1 - 1.0 K/uL   Eosinophils Relative 3 %   Eosinophils Absolute 0.2 0.0 - 0.7 K/uL   Basophils Relative 1 %   Basophils Absolute 0.1 0.0 - 0.1 K/uL    Comment: Performed at [facility], [address], [place], [place] [address]  Renal function panel     Status: Abnormal   Collection Time: 11/22/17  9:43 AM  Result Value Ref Range   Sodium 138 135 - 145 mmol/L   Potassium 4.1 3.5 - 5.1 mmol/L   Chloride 110 101 - 111 mmol/L   CO2 17 (L) 22 - 32 mmol/L   Glucose, Bld 80 65 - 99 mg/dL   BUN 81 (H) 6 - 20 mg/dL   Creatinine, Ser 2.86 (H) 0.44 - 1.00 mg/dL   Calcium 8.4 (L) 8.9 - 10.3 mg/dL   Phosphorus 5.4 (H) 2.5 - 4.6 mg/dL   Albumin 2.4 (L) 3.5 - 5.0 g/dL   GFR calc non Af Amer 17 (L) >60 mL/min   GFR calc Af Amer 20 (L) >60 mL/min    Comment: (NOTE) The eGFR has been calculated using the CKD EPI equation. This calculation has not been validated in all clinical situations. eGFR's persistently <60 mL/min signify possible Chronic Kidney Disease.    Anion gap 11 5 - 15  Comment: Performed at [facility], [place] [address] [place], [place] [address]  Glucose, capillary     Status: None   Collection Time: 11/22/17 12:15 PM  Result Value Ref Range   Glucose-Capillary 83 65 - 99 mg/dL  Glucose, capillary     Status: Abnormal   Collection Time: 11/22/17  5:33 PM  Result Value Ref Range   Glucose-Capillary 121 (H) 65 - 99 mg/dL  Glucose, capillary     Status: Abnormal   Collection Time: 11/22/17  8:59 PM  Result Value Ref Range   Glucose-Capillary 110 (H) 65 - 99 mg/dL  Renal function panel     Status: Abnormal   Collection Time: 11/23/17  4:51 AM  Result Value Ref Range   Sodium 139 135 - 145 mmol/L   Potassium 4.1 3.5 - 5.1 mmol/L   Chloride 110 101 - 111 mmol/L   CO2 19 (L) 22 - 32 mmol/L   Glucose, Bld 83 65 - 99 mg/dL   BUN 83 (H) 6 - 20 mg/dL   Creatinine, Ser 2.82 (H)  0.44 - 1.00 mg/dL   Calcium 8.5 (L) 8.9 - 10.3 mg/dL   Phosphorus 5.6 (H) 2.5 - 4.6 mg/dL   Albumin 2.2 (L) 3.5 - 5.0 g/dL   GFR calc non Af Amer 18 (L) >60 mL/min   GFR calc Af Amer 20 (L) >60 mL/min    Comment: (NOTE) The eGFR has been calculated using the CKD EPI equation. This calculation has not been validated in all clinical situations. eGFR's persistently <60 mL/min signify possible Chronic Kidney Disease.    Anion gap 10 5 - 15    Comment: Performed at [facility] [address], [place], [place] [address]  Glucose, capillary     Status: None   Collection Time: 11/23/17  6:27 AM  Result Value Ref Range   Glucose-Capillary 85 65 - 99 mg/dL  Glucose, capillary     Status: None   Collection Time: 11/23/17 11:31 AM  Result Value Ref Range   Glucose-Capillary 99 65 - 99 mg/dL  Glucose, capillary     Status: None   Collection Time: 11/23/17  5:21 PM  Result Value Ref Range   Glucose-Capillary 81 65 - 99 mg/dL  Glucose, capillary     Status: None   Collection Time: 11/23/17  9:03 PM  Result Value Ref Range   Glucose-Capillary 75 65 - 99 mg/dL  Renal function panel     Status: Abnormal   Collection Time: 11/24/17  5:40 AM  Result Value Ref Range   Sodium 140 135 - 145 mmol/L   Potassium 3.6 3.5 - 5.1 mmol/L   Chloride 110 101 - 111 mmol/L   CO2 19 (L) 22 - 32 mmol/L   Glucose, Bld 80 65 - 99 mg/dL   BUN 87 (H) 6 - 20 mg/dL   Creatinine, Ser 2.93 (H) 0.44 - 1.00 mg/dL   Calcium 8.5 (L) 8.9 - 10.3 mg/dL   Phosphorus 5.4 (H) 2.5 - 4.6 mg/dL   Albumin 2.2 (L) 3.5 - 5.0 g/dL   GFR calc non Af Amer 17 (L) >60 mL/min   GFR calc Af Amer 20 (L) >60 mL/min    Comment: (NOTE) The eGFR has been calculated using the CKD EPI equation. This calculation has not been validated in all clinical situations. eGFR's persistently <60 mL/min signify possible Chronic Kidney Disease.    Anion gap 11 5 - 15    Comment: Performed at [facility], [place] [address],  [place], [place] [address]  CBC with Differential/Platelet     Status: Abnormal   Collection Time: 11/24/17  5:40 AM  Result Value Ref Range   WBC 7.9 4.0 - 10.5 K/uL    Comment: WHITE COUNT CONFIRMED ON SMEAR   RBC 3.75 (L) 3.87 - 5.11 MIL/uL   Hemoglobin 10.7 (L) 12.0 - 15.0 g/dL   HCT 32.4 (L) 36.0 - 46.0 %   MCV 86.4 78.0 - 100.0 fL   MCH 28.5 26.0 - 34.0 pg   MCHC 33.0 30.0 - 36.0 g/dL   RDW 16.5 (H) 11.5 - 15.5 %   Platelets 375 150 - 400 K/uL    Comment: PLATELET COUNT CONFIRMED BY SMEAR   Neutrophils Relative % 48 %   Lymphocytes Relative 43 %   Monocytes Relative 6 %   Eosinophils Relative 3 %   Basophils Relative 0 %   Neutro Abs 3.8 1.7 - 7.7 K/uL   Lymphs Abs 3.4 0.7 - 4.0 K/uL   Monocytes Absolute 0.5 0.1 - 1.0 K/uL   Eosinophils Absolute 0.2 0.0 - 0.7 K/uL   Basophils Absolute 0.0 0.0 - 0.1 K/uL   Smear Review MORPHOLOGY UNREMARKABLE     Comment: Performed at [facility], [place] [address], [place], [place] [address]  Glucose, capillary     Status: Abnormal   Collection Time: 11/24/17  6:26 AM  Result Value Ref Range   Glucose-Capillary 112 (H) 65 - 99 mg/dL  Glucose, capillary     Status: Abnormal   Collection Time: 11/24/17 11:17 AM  Result Value Ref Range   Glucose-Capillary 100 (H) 65 - 99 mg/dL  Troponin I     Status: None   Collection Time: 11/24/17 11:38 AM  Result Value Ref Range   Troponin I <0.03 <0.03 ng/mL    Comment: Performed at [facility], [place] [address], [place], [place] [address]  Glucose, capillary     Status: None   Collection Time: 11/24/17  4:36 PM  Result Value Ref Range   Glucose-Capillary 88 65 - 99 mg/dL  Troponin I     Status: None   Collection Time: 11/24/17  4:57 PM  Result Value Ref Range   Troponin I <0.03 <0.03 ng/mL    Comment: Performed at [facility] [address] [place], [place] [address]  Hemoglobin and hematocrit, blood     Status: Abnormal   Collection Time: 11/24/17  5:19 PM  Result Value Ref  Range   Hemoglobin 11.0 (L) 12.0 - 15.0 g/dL   HCT 33.6 (L) 36.0 - 46.0 %    Comment: Performed at [facility] [address] [place], [place] [address]  Glucose, capillary     Status: None   Collection Time: 11/24/17  9:18 PM  Result Value Ref Range   Glucose-Capillary 95 65 - 99 mg/dL  Troponin I     Status: None   Collection Time: 11/24/17 11:28 PM  Result Value Ref Range   Troponin I <0.03 <0.03 ng/mL    Comment: Performed at [facility] [address], [place], [place] [address]  Renal function panel     Status: Abnormal   Collection Time: 11/25/17  4:57 AM  Result Value Ref Range   Sodium 141 135 - 145 mmol/L   Potassium 4.1 3.5 - 5.1 mmol/L   Chloride 112 (H) 101 - 111 mmol/L   CO2 17 (L) 22 - 32  mmol/L   Glucose, Bld 82 65 - 99 mg/dL   BUN 86 (H) 6 - 20 mg/dL   Creatinine, Ser 2.96 (H) 0.44 - 1.00 mg/dL   Calcium 8.6 (L) 8.9 - 10.3 mg/dL   Phosphorus 5.2 (H) 2.5 - 4.6 mg/dL   Albumin 2.3 (L) 3.5 - 5.0 g/dL   GFR calc non Af Amer 17 (L) >60 mL/min   GFR calc Af Amer 19 (L) >60 mL/min    Comment: (NOTE) The eGFR has been calculated using the CKD EPI equation. This calculation has not been validated in all clinical situations. eGFR's persistently <60 mL/min signify possible Chronic Kidney Disease.    Anion gap 12 5 - 15    Comment: Performed at [facility] [address] [place], [place] [address]  CBC     Status: Abnormal   Collection Time: 11/25/17  4:57 AM  Result Value Ref Range   WBC 11.4 (H) 4.0 - 10.5 K/uL   RBC 3.91 3.87 - 5.11 MIL/uL   Hemoglobin 10.9 (L) 12.0 - 15.0 g/dL   HCT 33.8 (L) 36.0 - 46.0 %   MCV 86.4 78.0 - 100.0 fL   MCH 27.9 26.0 - 34.0 pg   MCHC 32.2 30.0 - 36.0 g/dL   RDW 16.7 (H) 11.5 - 15.5 %   Platelets 362 150 - 400 K/uL    Comment: Performed at [facility], [place] [address] [place], [place] [address]  Glucose, capillary     Status: None   Collection Time: 11/25/17  6:44 AM  Result Value Ref Range    Glucose-Capillary 73 65 - 99 mg/dL  Glucose, capillary     Status: None   Collection Time: 11/25/17  7:06 AM  Result Value Ref Range   Glucose-Capillary 99 65 - 99 mg/dL    General: NAD. Vital signs reviewed. HEENT: Normocephalic, atraumatic.  Cardio: RRR. No JVD. Resp: CTA B/L and unlabored GI: BS positive and ND Musc/Skel:  No edmea or tenderness. Neuro: Alert and oriented 3 with cues Motor:  LUE: 5/5 proximal to distal RUE: 4+/5 proximal to distal  RLE: HF: 2+/5, ADF/PF 2/5 (unchanged) LLE: HF 3-/5, ADF/PF 2+/5 (unchanged) Skin: Intact. Warm and dry.  Psych: Flat, cooperative.   Assessment/Plan: 1. Functional deficits secondary to debility and encephalopathy which require 3+ hours per day of interdisciplinary therapy in a comprehensive inpatient rehab setting. Physiatrist is providing close team supervision and 24 hour management of active medical problems listed below. Physiatrist and rehab team continue to assess barriers to discharge/monitor patient progress toward functional and medical goals. FIM: Function - Bathing Position: Bed Body parts bathed by patient: Right arm, Left arm, Chest, Abdomen, Right upper leg, Left upper leg Body parts bathed by helper: Front perineal area, Buttocks, Right lower leg, Left lower leg, Back Bathing not applicable: Left upper leg, Left lower leg, Right lower leg, Right upper leg Assist Level: (Mod assist)  Function- Upper Body Dressing/Undressing What is the patient wearing?: Pull over shirt/dress Bra - Perfomed by patient: Thread/unthread left bra strap Bra - Perfomed by helper: Thread/unthread right bra strap, Hook/unhook bra (pull down sports bra), Thread/unthread left bra strap Pull over shirt/dress - Perfomed by patient: Thread/unthread right sleeve, Thread/unthread left sleeve, Put head through opening Pull over shirt/dress - Perfomed by helper: Pull shirt over trunk Assist Level: Touching or steadying assistance(Pt >  75%) Function - Lower Body Dressing/Undressing What is the patient wearing?: Pants, Non-skid slipper socks Position: Bed Underwear - Performed by  helper: Thread/unthread right underwear leg, Thread/unthread left underwear leg Pants- Performed by patient: Pull pants up/down Pants- Performed by helper: Thread/unthread left pants leg, Thread/unthread right pants leg Non-skid slipper socks- Performed by helper: Don/doff right sock, Don/doff left sock Assist for footwear: Maximal assist Assist for lower body dressing: (Max assist) Set up : To obtain clothing/put away  Function - Toileting Toileting activity did not occur: No continent bowel/bladder event Toileting steps completed by helper: Adjust clothing prior to toileting, Performs perineal hygiene, Adjust clothing after toileting(per [place] Flirt Aquit, NT report) Assist level: Touching or steadying assistance (Pt.75%)(per Haskell Flirt Aquit, NT report)  Function - Air cabin crew transfer activity did not occur: Safety/medical concerns Toilet transfer assistive device: Drop arm commode, Sliding board, Bedside commode Assist level to toilet: 2 helpers(per [place], NT report) Assist level to bedside commode (at bedside): Total assist (Pt < 25%) Assist level from bedside commode (at bedside): Total assist (Pt < 25%)  Function - Chair/bed transfer Chair/bed transfer method: Lateral scoot Chair/bed transfer assist level: 2 helpers Chair/bed transfer assistive device: Sliding board(deflated bed) Chair/bed transfer details: Verbal cues for precautions/safety, Verbal cues for safe use of DME/AE, Manual facilitation for weight shifting, Manual facilitation for placement  Function - Locomotion: Wheelchair Will patient use wheelchair at discharge?: Yes Type: Manual Max wheelchair distance: 150' Assist Level: Touching or steadying assistance (Pt > 75%) Assist Level: Touching or steadying assistance (Pt > 75%) Wheel 150 feet  activity did not occur: Safety/medical concerns Assist Level: Touching or steadying assistance (Pt > 75%) Turns around,maneuvers to table,bed, and toilet,negotiates 3% grade,maneuvers on rugs and over doorsills: No Function - Locomotion: Ambulation Ambulation activity did not occur: Safety/medical concerns Walk 10 feet activity did not occur: Safety/medical concerns Walk 50 feet with 2 turns activity did not occur: Safety/medical concerns Walk 150 feet activity did not occur: Safety/medical concerns Walk 10 feet on uneven surfaces activity did not occur: Safety/medical concerns  Function - Comprehension Comprehension: Auditory Comprehension assist level: Understands basic 90% of the time/cues < 10% of the time  Function - Expression Expression: Verbal Expression assist level: Expresses basic 90% of the time/requires cueing < 10% of the time.  Function - Social Interaction Social Interaction assist level: Interacts appropriately 75 - 89% of the time - Needs redirection for appropriate language or to initiate interaction.  Function - Problem Solving Problem solving assist level: Solves basic 75 - 89% of the time/requires cueing 10 - 24% of the time  Function - Memory Memory assist level: Recognizes or recalls 50 - 74% of the time/requires cueing 25 - 49% of the time Patient normally able to recall (first 3 days only): That he or she is in a hospital  Medical Problem List and Plan: 1.Functional and mobility deficitssecondary to debility and encephalopathy from multiple medical issues   Cont CIR    repeat MRI brain and T-spine suggesting stable/improvement. Neurology signed off. 2. DVT Prophylaxis/Anticoagulation: Pharmaceutical:Lovenox 3. Pain Management:tylenol prn 4. Mood:LCSW to follow for evaluation and support.   team to continue to provide support    xanax prn for anxiety 5. Neuropsych: This patientis not fullycapable of making decisions onherown behalf. 6.  Skin/Wound Care:Air mattress for pressure relief measures. Local measures to help managesacral /labialMASD PRAFO's for bilateral LE's 7. Fluids/Electrolytes/Nutrition:Monitor I/O.    Continue to offer supplements to help with protein calorie malnutrition.   Cont Remeron 15 qhs 8. C diff colitis:    Vancomycincompleted 2/6 Continue enteric precautions  Repeat C diff PCR +  but toxin negative.     Appreciate ID recs, no plans to treat at present 9. HTN: Monitor BP bid.    Nifedipine bid increased to 60 on 2/6   Imdur increased to 90 on 2/4   Metoprolol increased to 100 BID on 2/3   Hydralazine 10 3 times a day started on 2/13, increased to 25 on 2/14   Labile on 2/21 Vitals:   11/24/17 2214 11/25/17 0411  BP: (!) 156/70 (!) 145/65  Pulse: 93 80  Resp:  18  Temp:  98 F (36.7 C)  SpO2:  97%  10 T2DM with retinopathy, neuropathy, andnephropathy:Monitor BS ac/hs.    SSI for elevated BS CBG (last 3)  Recent Labs    11/24/17 2118 11/25/17 0644 11/25/17 0706  GLUCAP 95 73 99    Relatively controlled on 2/21 11. H/o depression: Continue Effexor and Remeron.  12. Neurogenic bladder:Has chronic indwelling catheter for 2+ months.   Foley out, I/O caths, keep strict I/Os.  13.AKI onCKD with history of overload:    Cr 2.96 on 2/21, IVF started by nephrology, d/ced due to fluid overload, no real changes per Nephro   Daily Labs    Encourage fluids   Appreciate nephrology recs   Cont to monitor 14. Diffuse CAD: treated medically with Metoprolol, Zetia and Plavix. 15.  ABLA on anemia of chronic disease:    Stool OB now neg   Hb 10.9 on 2/21   Appreciate GI recs, EGD/colonoscopy performed on 2/10 showing a total of 5 duodenal ulcers, which are presumed to be a source of bleeding.  16. Leukocytosis:    WBCs 11.4 on 2/21   Afebrile   Labs ordered for tomorrow   Repeat UA relatively unremarkable for infection, Ucx multi species  17.  Hypoalbuminemia   Cont supplement 18. Hyperkalemia: Resolved   Cont to monitor    K+ 40 daily x2 days due to lasix 19. Acute lower UTI   UA is equivocal, urine culture with Pseudomonas   Cipro completed 2/11-2/13 20.  GI bleed   Believed to be secondary to duodenal ulcers   Appreciate GI recs, 40 mg Protonix twice daily    H. pylori serology negative   Polyp biopsy on colonoscopy results to be sent patient for GI 21. Dyspnea   Continue Lasix  CXR reviewed, suggesting fluid overload 22. Syncope  Appreciate teaching service recs, likely vasovagal   Additional IV dose lasix given on 2/20   ECG/CE reviewed, no significant acute concerns.  Cardiac enzymes negative, ECG  Repeat CXR reviewed, showing opacities. Appreciate pulmonary recs suggesting likely fluid overload and less likely pneumonia.    Lower extremity Dopplers ordered to rule out PE, pending  Will order repeat chest x-ray for tomorrow as well as labs to rule out pneumonia      LOS (Days) 20 A FACE TO FACE EVALUATION WAS PERFORMED  Ankit Lorie Phenix 11/25/2017, 9:15 AM

## 2017-11-25 NOTE — Progress Notes (Signed)
BLE dopplers positive for acute peroneal DVT in RLE. She did have dopplers 10/06/17 that was negative.  She has had a good day today--increased activity, more alert and no dyspnea. Discussed with Dr Deveron Furlong monitor with repeat dopplers as too high risk for renal failure with CTA chest and question sensitivity of V/Q scan with pulmonary edema.  Will discuss with pulmonary. Repeat CXR ordered for tomorrow.

## 2017-11-25 NOTE — Progress Notes (Addendum)
Physical Therapy Note  Patient Details  Name: Gerardine Peltz MRN: [id number] Date of Birth: Feb 25, 1961 Today's Date: 11/25/2017  1100-1205, 65 min individual tx Pain: none per pt  Pt sitting up and looking brighter than yesterday.  Pt oriented to day of week, month, location, and situation.  Slide board transfer to level mat with shoes donned for traction, and 4" stool under feet, with min/mod assist, mod cues for head/hips relationship, forward wt shift and elevating hips. and significant extra time.  Pt sat EOM x 35 min with wedge under pelvis to offset posterior pelvic tilt, with stool under feet for 90-90-90 degree positions at LE joints.  Activities for dynamic sitting balance included reaching out of BOS with either hand in all directions, with contralateral UE support.  R/L lateral leans x 5, trunk ext/flexion without UE support x 10.  No c/o dizziness; pt provided multiple breaks during activity. Also active assistive R ankle DF, active R PF and L active PF/DF in sitting, x 10 each. Edema and weakness limit pt's RLE movements.    Pt stated " I need to be out of the bed more often, exercising, so that I can get stronger."  Slide board transfer to return to w/c.  Pt able to scoot backwards in chair with tactile and VCs.  W/c propulsion using bil UEs on level tile x 25' to fatigue.  Pt left resting in w/c with quick release belt applied , alarm set and all needs within reach. Pt set up for lunch.  See function navigator for current status.   Camylle Whicker 11/25/2017, 7:57 AM

## 2017-11-25 NOTE — Progress Notes (Signed)
   Subjective: Ms Douglass was seen resting in her bed this morning. She states she was feeling well, but tired. She denies any further dizziness or linghtheadedness. She further denies any chest pain, palpitation, or shortness of breath. She denies dysuria. She has no other complaints or questions this morning.  Objective:  Vital signs in last 24 hours: Vitals:   11/24/17 1500 11/24/17 1526 11/24/17 2214 11/25/17 0411  BP: 133/68 134/67 (!) 156/70 (!) 145/65  Pulse: 87 82 93 80  Resp:  18  18  Temp:  97.9 F (36.6 C)  98 F (36.7 C)  TempSrc:  Oral  Oral  SpO2:  94%  97%  Weight:      Height:       Physical Exam  Constitutional: She appears well-developed and well-nourished.  Wheelchair/Bed bound female  HENT:  Head: Normocephalic and atraumatic.  Eyes: EOM are normal. Right eye exhibits no discharge. Left eye exhibits no discharge.  Cardiovascular: Normal rate, regular rhythm, normal heart sounds and intact distal pulses.  Pulmonary/Chest: Effort normal and breath sounds normal. No respiratory distress.  Abdominal: Soft. Bowel sounds are normal. She exhibits no distension.  Tenderness to palpation of suprapubic region  Musculoskeletal: She exhibits no deformity.  2-3+ pitting edema bilateral LEs  Neurological: She is alert.  Skin: Skin is warm and dry.    Assessment/Plan:  Syncope and Collapse 2/2 Vasovagal episode: Brief syncopal episode while transferring and holding her breath with associated prodrome of tunnel vision and increased heart rate, and feeling like she was going to pass out is consistent with vasovagal episode. Orthostatic vitals negative (laying and sitting as patient cannot stand and was not standing during event). EKG without arrhythmia or ischemic changes. Troponin negative x2. Low suspicion for any arrhythmogenic cause or PE. Patient has 5 duodenal ulcers Identified on EGD 2/10, but no signs of bleeding and Hgb remains stable at 10.9. - Leukocytosis noted on  AM labs, and suprapubic tenderness on exam. Consider repeat U/A with patients history or recent UTI.  Thank you for including [place] in the care of this patient. Internal Medicine to sign off at this time. Please contact us with further questions.  Neva Seat, MD 11/25/2017, 6:28 AM Pager: [phone]

## 2017-11-25 NOTE — Progress Notes (Signed)
Physical Therapy Session Note  Patient Details  Name: Laura [name] MRN: [id number] Date of Birth: 1960/11/27  Today's Date: 11/25/2017 PT Individual Time: 1330-1355 PT Individual Time Calculation (min): 25 min   Skilled Therapeutic Interventions/Progress Updates:  Therapist spoke with pt's RN Felizardo Hoffmann) regarding pt's new diagnosis of R DVT & RN states that pt is cleared to participate in therapy. No restrictions were communicated to this therapist.   Pt received in bed & agreeable to tx. Pt noting pain in RLE and pt educated on + DVT in that extremity. Pt performed the following LLE strengthening exercises: heel slides, ankle pumps, straight leg raises, hip abduction slides, and short arc quads with assistance as needed. Therapist provided instruction for proper technique. Pt then performed BUE strengthening exercises with yellow theraband including bicep curls and tricep extensions with max multimodal cuing for proper technique. At end of session pt left in bed with all needs within reach.    Therapy Documentation Precautions:  Precautions Precautions: Fall Precaution Comments: monitor blood pressure Restrictions Weight Bearing Restrictions: No   See Function Navigator for Current Functional Status.   Therapy/Group: Individual Therapy  Waunita Schooner 11/25/2017, 4:07 PM

## 2017-11-25 NOTE — Progress Notes (Signed)
Bilateral lower extremity venous duplex has been completed. There is evidence of acute deep vein thrombosis involving a short segment of the peroneal veins of the right lower extremity. Negative for DVT on the left. Results were given to the patient's nurse, Awdrea.  11/25/17 1:31 PM Laura [name] RVT

## 2017-11-25 NOTE — Progress Notes (Signed)
Occupational Therapy Session Note  Patient Details  Name: Laura [name] MRN: [id number] Date of Birth: Jun 25, 1961  Today's Date: 11/25/2017 OT Individual Time: 8882-8003 OT Individual Time Calculation (min): 60 min    Short Term Goals: Week 3:  OT Short Term Goal 1 (Week 3): STG = LTGs due to remaining LOS  Skilled Therapeutic Interventions/Progress Updates:    Treatment session with focus on mobility, postural control, and UB strengthening to decrease burden of care with bathing/dressing tasks.  Pt received supine in bed willing to engage in treatment session.  Completed LB bathing and dressing at bed level with min assist for rolling, therapist positioning BLE to increase success with rolling.  Pt able to pull pants over hips in sidelying with cues.  Transferred bed > w/c via slide board from deflated bed with max-total assist.  Pt initiating slight weight shift and lift off during transfer, however still requiring total assist for transfer.  Completed UB bathing and dressing seated in w/c with setup for items.  Engaged in UE arm bike for [place] with pt able to complete 1:30 forward and only 30 seconds backwards before requiring rest break.  Cues for breathing technique during mobility and exercise.  Pt returned to room and left upright in w/c with chair alarm and quick release belt fastened.  Pt's husband scheduled to arrive at 22 for further family education, however was not present during session.  Therapy Documentation Precautions:  Precautions Precautions: Fall Precaution Comments: monitor blood pressure Restrictions Weight Bearing Restrictions: No Pain: Pain Assessment Pain Assessment: No/denies pain  See Function Navigator for Current Functional Status.   Therapy/Group: Individual Therapy  [name] 11/25/2017, 9:49 AM

## 2017-11-25 NOTE — Progress Notes (Signed)
Speech Language Pathology Daily Session Note  Patient Details  Name: Laura [name] MRN: [id number] Date of Birth: 02/06/1961  Today's Date: 11/25/2017 SLP Individual Time: 1001-1100 SLP Individual Time Calculation (min): 59 min  Short Term Goals: Week 3: SLP Short Term Goal 1 (Week 3): Pt will utilize external memory aides to recall new dialy information with Mod A cues.  SLP Short Term Goal 2 (Week 3): Pt will sustain attention to basic familiar task for ~ 20 minutes with Min A cues.  SLP Short Term Goal 3 (Week 3): Pt will complete basic familiar problem solving task related to ADL with Min A cues.  SLP Short Term Goal 4 (Week 3): Pt will increase vocal intensity to achieve intelligibility in mildly noisy environments with mod verbal cues.    Skilled Therapeutic Interventions:  Pt was seen for skilled ST targeting cognitive goals.   SLP facilitated the session with a previously taught card game to address recall of new information.  Pt could recall 2 out of 3 rules of task with supervision but needed mod-max assist verbal cues for working memory of task rules and procedures in order to effectively plan and execute a problem solving strategy.  SLP also facilitated the session with a basic puzzle task to address sustained attention goals. Pt sustained her attention to task for ~15 minutes with supervision cues.  Pt was returned to room and left in wheelchair with quick release belt donned and call bell within reach.  Continue per current plan of care.     Function:  Eating Eating                 Cognition Comprehension Comprehension assist level: Follows basic conversation/direction with extra time/assistive device  Expression   Expression assist level: Expresses basic needs/ideas: With extra time/assistive device  Social Interaction Social Interaction assist level: Interacts appropriately 75 - 89% of the time - Needs redirection for appropriate language or to initiate interaction.   Problem Solving Problem solving assist level: Solves basic 75 - 89% of the time/requires cueing 10 - 24% of the time  Memory Memory assist level: Recognizes or recalls 50 - 74% of the time/requires cueing 25 - 49% of the time    Pain Pain Assessment Pain Assessment: No/denies pain  Therapy/Group: Individual Therapy  Deneane Stifter, Selinda Orion 11/25/2017, 12:14 PM

## 2017-11-26 ENCOUNTER — Inpatient Hospital Stay (HOSPITAL_COMMUNITY): Payer: No Typology Code available for payment source

## 2017-11-26 ENCOUNTER — Inpatient Hospital Stay (HOSPITAL_COMMUNITY): Payer: No Typology Code available for payment source | Admitting: Occupational Therapy

## 2017-11-26 ENCOUNTER — Inpatient Hospital Stay (HOSPITAL_COMMUNITY): Payer: No Typology Code available for payment source | Admitting: Speech Pathology

## 2017-11-26 ENCOUNTER — Inpatient Hospital Stay (HOSPITAL_COMMUNITY): Payer: No Typology Code available for payment source | Admitting: Physical Therapy

## 2017-11-26 DIAGNOSIS — I82491 Acute embolism and thrombosis of other specified deep vein of right lower extremity: Secondary | ICD-10-CM

## 2017-11-26 DIAGNOSIS — I82409 Acute embolism and thrombosis of unspecified deep veins of unspecified lower extremity: Secondary | ICD-10-CM | POA: Insufficient documentation

## 2017-11-26 DIAGNOSIS — S0990XA Unspecified injury of head, initial encounter: Secondary | ICD-10-CM

## 2017-11-26 DIAGNOSIS — J810 Acute pulmonary edema: Secondary | ICD-10-CM | POA: Insufficient documentation

## 2017-11-26 LAB — CBC WITH DIFFERENTIAL/PLATELET
BASOS ABS: 0.1 10*3/uL (ref 0.0–0.1)
Basophils Relative: 1 %
Eosinophils Absolute: 0.4 10*3/uL (ref 0.0–0.7)
Eosinophils Relative: 3 %
HEMATOCRIT: 35.1 % — AB (ref 36.0–46.0)
Hemoglobin: 11.5 g/dL — ABNORMAL LOW (ref 12.0–15.0)
LYMPHS PCT: 34 %
Lymphs Abs: 3.5 10*3/uL (ref 0.7–4.0)
MCH: 28.3 pg (ref 26.0–34.0)
MCHC: 32.8 g/dL (ref 30.0–36.0)
MCV: 86.5 fL (ref 78.0–100.0)
Monocytes Absolute: 0.9 10*3/uL (ref 0.1–1.0)
Monocytes Relative: 8 %
NEUTROS ABS: 5.6 10*3/uL (ref 1.7–7.7)
NEUTROS PCT: 54 %
Platelets: 386 10*3/uL (ref 150–400)
RBC: 4.06 MIL/uL (ref 3.87–5.11)
RDW: 16.7 % — AB (ref 11.5–15.5)
WBC: 10.5 10*3/uL (ref 4.0–10.5)

## 2017-11-26 LAB — RENAL FUNCTION PANEL
ALBUMIN: 2.5 g/dL — AB (ref 3.5–5.0)
ANION GAP: 12 (ref 5–15)
BUN: 81 mg/dL — ABNORMAL HIGH (ref 6–20)
CALCIUM: 8.9 mg/dL (ref 8.9–10.3)
CO2: 20 mmol/L — ABNORMAL LOW (ref 22–32)
Chloride: 111 mmol/L (ref 101–111)
Creatinine, Ser: 2.98 mg/dL — ABNORMAL HIGH (ref 0.44–1.00)
GFR, EST AFRICAN AMERICAN: 19 mL/min — AB (ref >60)
GFR, EST NON AFRICAN AMERICAN: 17 mL/min — AB (ref >60)
Glucose, Bld: 103 mg/dL — ABNORMAL HIGH (ref 65–99)
PHOSPHORUS: 4.6 mg/dL (ref 2.5–4.6)
POTASSIUM: 4.2 mmol/L (ref 3.5–5.1)
SODIUM: 143 mmol/L (ref 135–145)

## 2017-11-26 LAB — GLUCOSE, CAPILLARY
GLUCOSE-CAPILLARY: 169 mg/dL — AB (ref 65–99)
GLUCOSE-CAPILLARY: 164 mg/dL — AB (ref 65–99)
Glucose-Capillary: 92 mg/dL (ref 65–99)
Glucose-Capillary: 165 mg/dL — ABNORMAL HIGH (ref 65–99)

## 2017-11-26 MED ORDER — BUMETANIDE 1 MG PO TABS
1.0000 mg | ORAL_TABLET | Freq: Two times a day (BID) | ORAL | Status: DC
  Administered 2017-11-26: 1 mg via ORAL
  Filled 2017-11-26: qty 1

## 2017-11-26 MED ORDER — BUMETANIDE 1 MG PO TABS
2.0000 mg | ORAL_TABLET | Freq: Two times a day (BID) | ORAL | Status: DC
  Administered 2017-11-26 – 2017-12-01 (×10): 2 mg via ORAL
  Filled 2017-11-26 (×10): qty 2

## 2017-11-26 NOTE — Progress Notes (Signed)
Social Work Patient ID: Laura [name], female   DOB: 22-May-1961, 57 y.o.   MRN: [id number]   Difficult discussion with pt's spouse yesterday afternoon. Spouse tearful and he talks about his concern of pt's care needs and medical instability.  He feels he will need to pursue SNF as he and family are unable to provide current level of care needed.  I will speak with insurance about change in d/c plan and to confirm coverage of SNF.  Spouse has not yet spoken with pt about this plan - will follow up with him again at the beginning of the week.  Will alert tx team.  Lennart Pall, LCSW

## 2017-11-26 NOTE — Progress Notes (Signed)
Occupational Therapy Session Note  Patient Details  Name: Laura [name] MRN: [id number] Date of Birth: Sep 04, 1961  Today's Date: 11/26/2017 OT Individual Time: 1102-1206 OT Individual Time Calculation (min): 64 min    Short Term Goals: Week 3:  OT Short Term Goal 1 (Week 3): STG = LTGs due to remaining LOS  Skilled Therapeutic Interventions/Progress Updates:    Treatment session with focus on functional mobility, trunk control, and activity tolerance during self-care tasks.  Pt willing to engage in bathing at shower level this session.  Completed slide board transfer w/c > roll in shower chair with 2nd person present to stabilize board and shower chair, transfer completed with max assist.  Pt demonstrating increased trunk control and weight shifting allowing for increased ease of transfer.  Pt completed lateral leans and lifting of alternating hips to doff pants and brief while seated in shower chair.  Pt bathed and washed hair with setup and min cues for sequencing at shower level.  Pt incontinent of bowel, requiring additional clean up at bed level post shower.  Utilized Stedy for transfer from shower chair to bed for energy conservation.  Engaged in sit > stand in [place] with pt demonstrating increased weight shift and lift off in to standing with min tactile cues for weight shift.  Donned pants at bed level for energy conservation and secondary to RN changing dressing on wound on bottom.  Transferred back to w/c via Stedy and left upright in w/c with chair alarm and quick release belt on and all needs in reach.  Therapy Documentation Precautions:  Precautions Precautions: Fall Precaution Comments: monitor blood pressure Restrictions Weight Bearing Restrictions: No Pain: Pain Assessment Pain Assessment: No/denies pain  See Function Navigator for Current Functional Status.   Therapy/Group: Individual Therapy  [name] 11/26/2017, 12:18 PM

## 2017-11-26 NOTE — Progress Notes (Addendum)
Discussed attempt out of bed with patient--she reports that she was trying to get the pants off the floor and hit her head but it doesn't hurt. Per reports patient was lodged between the bed and the bed rail. She realizes that she shouldn't have done it but perseverated on needing to get the pants off the floor. Cognitively at baseline line with poor memory, insight or awareness of deficits. Will get CT head for follow up.   BLE with increase in edema--1-2+ up to mid thigh. Face also appears fuller. Wt - 136 lbs today up by 10 lbs.

## 2017-11-26 NOTE — Progress Notes (Signed)
Patient found hanging in between side rails with arm lodged in between bed and side rail, patient stated that she hit her head on side table, she was trying to get out of bed to put on her pants no signs of injury noted no c/o  Pain, vitals stable PA  [place] notified no further orders given will continue to monitor.

## 2017-11-26 NOTE — Progress Notes (Signed)
Speech Language Pathology Weekly Progress and Session Note  Patient Details  Name: Laura [name] MRN: [id number] Date of Birth: 01/05/1961  Beginning of progress report period: November 19, 2017  End of progress report period: November 26, 2017   Today's Date: 11/26/2017 SLP Individual Time: 1300-1400 SLP Individual Time Calculation (min): 60 min  Short Term Goals: Week 3: SLP Short Term Goal 1 (Week 3): Pt will utilize external memory aides to recall new dialy information with Mod A cues.  SLP Short Term Goal 1 - Progress (Week 3): Met SLP Short Term Goal 2 (Week 3): Pt will sustain attention to basic familiar task for ~ 20 minutes with Min A cues.  SLP Short Term Goal 2 - Progress (Week 3): Met SLP Short Term Goal 3 (Week 3): Pt will complete basic familiar problem solving task related to ADL with Min A cues.  SLP Short Term Goal 3 - Progress (Week 3): Met SLP Short Term Goal 4 (Week 3): Pt will increase vocal intensity to achieve intelligibility in mildly noisy environments with mod verbal cues.   SLP Short Term Goal 4 - Progress (Week 3): Met    New Short Term Goals: Week 4: SLP Short Term Goal 1 (Week 4): Pt will utilize external memory aides to recall new dialy information with Min A cues.  SLP Short Term Goal 2 (Week 4): Pt will selectively attend to basic familiar task in a mildly distracting environment  for ~ 20 minutes with Min A cues.  SLP Short Term Goal 3 (Week 4): Pt will complete novel, basic familiar problem solving task with Min A cues.  SLP Short Term Goal 4 (Week 4): Pt will increase vocal intensity to achieve intelligibility in mildly noisy environments with min verbal cues.    Weekly Progress Updates:   Pt has made functional gains this reporting period and has met 4 out of 4 short term goals.  Pt is currently mod-max assist for tasks due to ongoing cognitive deficits.  Pt has demonstrated improved attention to tasks and recall of daily information.  Pt and family  education is ongoing.  Pt would continue to benefit from skilled ST while inpatient in order to maximize functional independence and reduce burden of care prior to discharge.  Per report from Education officer, museum, pt's family would like to now pursue nursing home placement due to pt's frail medical state and heavy burden of care.     Intensity: Minumum of 1-2 x/day, 30 to 90 minutes Frequency: 3 to 5 out of 7 days Duration/Length of Stay: 18 to 21 days Treatment/Interventions: Cognitive remediation/compensation;Cueing hierarchy;Functional tasks;Patient/family education;Therapeutic Activities;Internal/external aids   Daily Session  Skilled Therapeutic Interventions:  Pt was seen for skilled ST targeting cognitive goals.  SLP facilitated the session with a novel board game targeting problem solving.  Pt completed task with max faded to mod assist for recall of task rules and procedures in order  to plan and execute a problem solving strategy.  Pt was returned to room and left in wheelchair with quick release belt donned and call bell within reach.  Continue per current plan of care.         Function:   Eating Eating                 Cognition Comprehension Comprehension assist level: Understands basic 90% of the time/cues < 10% of the time  Expression   Expression assist level: Expresses basic needs/ideas: With extra time/assistive device  Social Interaction Social Interaction  assist level: Interacts appropriately 75 - 89% of the time - Needs redirection for appropriate language or to initiate interaction.  Problem Solving Problem solving assist level: Solves basic 75 - 89% of the time/requires cueing 10 - 24% of the time  Memory Memory assist level: Recognizes or recalls 50 - 74% of the time/requires cueing 25 - 49% of the time   General    Pain Pain Assessment Pain Assessment: No/denies pain  Therapy/Group: Individual Therapy  Arleene Settle, Selinda Orion 11/26/2017, 4:07 PM

## 2017-11-26 NOTE — Progress Notes (Signed)
S:  Legs wrapped Now on bumex No c/o from Pt Still with effusions on CXR Stable renal function  O:BP (!) 161/83 (BP Location: Right Arm)   Pulse 91   Temp 98.3 F (36.8 C) (Oral)   Resp 16   Ht 4' 11"  (1.499 m)   Wt 51.7 kg (114 lb)   SpO2 93%   BMI 23.03 kg/m   Intake/Output Summary (Last 24 hours) at 11/26/2017 1214 Last data filed at 11/26/2017 0934 Gross per 24 hour  Intake 120 ml  Output 2950 ml  Net -2830 ml   Intake/Output: I/O last 3 completed shifts: In: 480 [P.O.:480] Out: 2300 [Urine:2300]  Intake/Output this shift:  Total I/O In: -  Out: 1600 [Urine:1600] Weight change:  Gen: NAD CVS: no rub Resp: cta Abd: benign Ext: 3+ edema b/l  Recent Labs  Lab 11/20/17 0548 11/21/17 0719 11/22/17 0943 11/23/17 0451 11/24/17 0540 11/25/17 0457 11/26/17 0644  NA 139 140 138 139 140 141 143  K 4.0 4.2 4.1 4.1 3.6 4.1 4.2  CL 109 109 110 110 110 112* 111  CO2 19* 19* 17* 19* 19* 17* 20*  GLUCOSE 92 104* 80 83 80 82 103*  BUN 77* 82* 81* 83* 87* 86* 81*  CREATININE 2.72* 2.73* 2.86* 2.82* 2.93* 2.96* 2.98*  ALBUMIN  --   --  2.4* 2.2* 2.2* 2.3* 2.5*  CALCIUM 8.5* 8.4* 8.4* 8.5* 8.5* 8.6* 8.9  PHOS  --   --  5.4* 5.6* 5.4* 5.2* 4.6   Liver Function Tests: Recent Labs  Lab 11/24/17 0540 11/25/17 0457 11/26/17 0644  ALBUMIN 2.2* 2.3* 2.5*   No results for input(s): LIPASE, AMYLASE in the last 168 hours. No results for input(s): AMMONIA in the last 168 hours. CBC: Recent Labs  Lab 11/22/17 0943 11/24/17 0540 11/24/17 1719 11/25/17 0457 11/26/17 0644  WBC 8.9 7.9  --  11.4* 10.5  NEUTROABS 5.1 3.8  --   --  5.6  HGB 11.8* 10.7* 11.0* 10.9* 11.5*  HCT 35.6* 32.4* 33.6* 33.8* 35.1*  MCV 87.5 86.4  --  86.4 86.5  PLT 414* 375  --  362 386   Cardiac Enzymes: Recent Labs  Lab 11/24/17 1138 11/24/17 1657 11/24/17 2328  TROPONINI <0.03 <0.03 <0.03   CBG: Recent Labs  Lab 11/25/17 1142 11/25/17 1708 11/25/17 2203 11/26/17 0629  11/26/17 1153  GLUCAP 98 123* 134* 92 169*    Iron Studies: No results for input(s): IRON, TIBC, TRANSFERRIN, FERRITIN in the last 72 hours. Studies/Results: Dg Chest 2 View  Result Date: 11/26/2017 CLINICAL DATA:  Leukocytosis EXAM: CHEST  2 VIEW COMPARISON:  11/24/2017 FINDINGS: Cardiomegaly with vascular congestion. Bilateral perihilar and lower lobe opacities with small bilateral effusions. Findings could reflect edema or infection. IMPRESSION: Bilateral lower lobe airspace opacities with layering effusions. Findings could reflect edema or infection. No real change since prior study. Electronically Signed   By: Rolm Baptise M.D.   On: 11/26/2017 07:35   . bumetanide  2 mg Oral BID  . collagenase   Topical BID  . enoxaparin (LOVENOX) injection  30 mg Subcutaneous Q24H  . ezetimibe  10 mg Oral Daily  . feeding supplement (NEPRO CARB STEADY)  237 mL Oral TID WC  . feeding supplement (PRO-STAT SUGAR FREE 64)  30 mL Oral BID  . hydrALAZINE  25 mg Oral Q8H  . insulin aspart  0-9 Units Subcutaneous TID WC  . isosorbide mononitrate  90 mg Oral Daily  . metoprolol tartrate  100  mg Oral BID  . mirtazapine  15 mg Oral QHS  . multivitamin  1 tablet Oral QHS  . NIFEdipine  60 mg Oral BID  . pantoprazole  40 mg Oral BID AC  . polycarbophil  1,250 mg Oral BID AC  . venlafaxine XR  150 mg Oral Q breakfast    BMET    Component Value Date/Time   NA 143 11/26/2017 0644   K 4.2 11/26/2017 0644   CL 111 11/26/2017 0644   CO2 20 (L) 11/26/2017 0644   GLUCOSE 103 (H) 11/26/2017 0644   BUN 81 (H) 11/26/2017 0644   CREATININE 2.98 (H) 11/26/2017 0644   CALCIUM 8.9 11/26/2017 0644   GFRNONAA 17 (L) 11/26/2017 0644   GFRAA 19 (L) 11/26/2017 0644   CBC    Component Value Date/Time   WBC 10.5 11/26/2017 0644   RBC 4.06 11/26/2017 0644   HGB 11.5 (L) 11/26/2017 0644   HCT 35.1 (L) 11/26/2017 0644   PLT 386 11/26/2017 0644   MCV 86.5 11/26/2017 0644   MCH 28.3 11/26/2017 0644   MCHC 32.8  11/26/2017 0644   RDW 16.7 (H) 11/26/2017 0644   LYMPHSABS 3.5 11/26/2017 0644   MONOABS 0.9 11/26/2017 0644   EOSABS 0.4 11/26/2017 0644   BASOSABS 0.1 11/26/2017 0644      Assessment/Plan:  1. AKI/CKD stage 4- likley baseline diabetic nephropathy and worsening with acute health issues.  Labs oerall stable today.  2. ABLA due to GI Bleed and anemia of CKD stage 4- s/p transfusion 3. Normal AG Met Acidosis stopped [facility] 2/22 4. Hyperkalemia  K in 4s -- resolved no binder needed  5. CDI - s/p completion of po vancomycin for recent C Diff 6. HTN- stable 7. Severe deconditioning/debilitation- cont with PT/OT with inpatient rehab.  Would increse bumex dosing ot 53m PO BID.    Cont to follow. Daily RFP. Stop NahCO3 with edema. No [employer] Phos products in CKD.   RPearson GrippeMD [phone]

## 2017-11-26 NOTE — Progress Notes (Signed)
Subjective/Complaints: Patient seen lying in bed this morning. She states she slept well overnight. She is more alert this morning. She is concerned about her right lower extremity DVT and asks if she will need surgery.  Pt reported hit her head on head table.    ROS: Denies CP, SOB, nausea, vomiting, diarrhea.  Objective: Vital Signs: Blood pressure (!) 161/83, pulse 91, temperature 98.3 F (36.8 C), temperature source Oral, resp. rate 16, height 4' 11"  (1.499 m), weight 61.7 kg (136 lb), SpO2 93 %. Dg Chest 2 View  Result Date: 11/26/2017 CLINICAL DATA:  Leukocytosis EXAM: CHEST  2 VIEW COMPARISON:  11/24/2017 FINDINGS: Cardiomegaly with vascular congestion. Bilateral perihilar and lower lobe opacities with small bilateral effusions. Findings could reflect edema or infection. IMPRESSION: Bilateral lower lobe airspace opacities with layering effusions. Findings could reflect edema or infection. No real change since prior study. Electronically Signed   By: Rolm Baptise M.D.   On: 11/26/2017 07:35   Dg Chest Port 1 View  Result Date: 11/24/2017 CLINICAL DATA:  Shortness of Breath EXAM: PORTABLE CHEST 1 VIEW COMPARISON:  November 22, 2017 FINDINGS: There is persistent airspace consolidation in the left lower lobe with small left pleural effusion. There is patchy airspace opacity throughout the right mid lower lung zones, a new finding. There is a small loculated effusion on the right. There is cardiomegaly with pulmonary venous hypertension. No adenopathy evident. There is degenerative change in each shoulder. IMPRESSION: 1. New airspace opacity felt to represent pneumonia in the right mid and lower lung zones. Small loculated pleural effusion on the right. 2. Persistent airspace consolidation left lower lobe with small left pleural effusion. 3. Underlying pulmonary vascular congestion with cardiomegaly and pulmonary venous hypertension. Electronically Signed   By: Lowella Grip III M.D.   On:  11/24/2017 11:53   Results for orders placed or performed during the hospital encounter of 11/05/17 (from the past 72 hour(s))  Glucose, capillary     Status: None   Collection Time: 11/23/17 11:31 AM  Result Value Ref Range   Glucose-Capillary 99 65 - 99 mg/dL  Glucose, capillary     Status: None   Collection Time: 11/23/17  5:21 PM  Result Value Ref Range   Glucose-Capillary 81 65 - 99 mg/dL  Glucose, capillary     Status: None   Collection Time: 11/23/17  9:03 PM  Result Value Ref Range   Glucose-Capillary 75 65 - 99 mg/dL  Renal function panel     Status: Abnormal   Collection Time: 11/24/17  5:40 AM  Result Value Ref Range   Sodium 140 135 - 145 mmol/L   Potassium 3.6 3.5 - 5.1 mmol/L   Chloride 110 101 - 111 mmol/L   CO2 19 (L) 22 - 32 mmol/L   Glucose, Bld 80 65 - 99 mg/dL   BUN 87 (H) 6 - 20 mg/dL   Creatinine, Ser 2.93 (H) 0.44 - 1.00 mg/dL   Calcium 8.5 (L) 8.9 - 10.3 mg/dL   Phosphorus 5.4 (H) 2.5 - 4.6 mg/dL   Albumin 2.2 (L) 3.5 - 5.0 g/dL   GFR calc non Af Amer 17 (L) >60 mL/min   GFR calc Af Amer 20 (L) >60 mL/min    Comment: (NOTE) The eGFR has been calculated using the CKD EPI equation. This calculation has not been validated in all clinical situations. eGFR's persistently <60 mL/min signify possible Chronic Kidney Disease.    Anion gap 11 5 - 15    Comment: Performed  at [facility], [place] [address] [place], [place] [address]  CBC with Differential/Platelet     Status: Abnormal   Collection Time: 11/24/17  5:40 AM  Result Value Ref Range   WBC 7.9 4.0 - 10.5 K/uL    Comment: WHITE COUNT CONFIRMED ON SMEAR   RBC 3.75 (L) 3.87 - 5.11 MIL/uL   Hemoglobin 10.7 (L) 12.0 - 15.0 g/dL   HCT 32.4 (L) 36.0 - 46.0 %   MCV 86.4 78.0 - 100.0 fL   MCH 28.5 26.0 - 34.0 pg   MCHC 33.0 30.0 - 36.0 g/dL   RDW 16.5 (H) 11.5 - 15.5 %   Platelets 375 150 - 400 K/uL    Comment: PLATELET COUNT CONFIRMED BY SMEAR   Neutrophils Relative % 48 %   Lymphocytes  Relative 43 %   Monocytes Relative 6 %   Eosinophils Relative 3 %   Basophils Relative 0 %   Neutro Abs 3.8 1.7 - 7.7 K/uL   Lymphs Abs 3.4 0.7 - 4.0 K/uL   Monocytes Absolute 0.5 0.1 - 1.0 K/uL   Eosinophils Absolute 0.2 0.0 - 0.7 K/uL   Basophils Absolute 0.0 0.0 - 0.1 K/uL   Smear Review MORPHOLOGY UNREMARKABLE     Comment: Performed at [facility], [place] [address] [place], [place] [address]  Glucose, capillary     Status: Abnormal   Collection Time: 11/24/17  6:26 AM  Result Value Ref Range   Glucose-Capillary 112 (H) 65 - 99 mg/dL  Glucose, capillary     Status: Abnormal   Collection Time: 11/24/17 11:17 AM  Result Value Ref Range   Glucose-Capillary 100 (H) 65 - 99 mg/dL  Troponin I     Status: None   Collection Time: 11/24/17 11:38 AM  Result Value Ref Range   Troponin I <0.03 <0.03 ng/mL    Comment: Performed at [facility], [place] [address], [place], [place] [address]  Glucose, capillary     Status: None   Collection Time: 11/24/17  4:36 PM  Result Value Ref Range   Glucose-Capillary 88 65 - 99 mg/dL  Troponin I     Status: None   Collection Time: 11/24/17  4:57 PM  Result Value Ref Range   Troponin I <0.03 <0.03 ng/mL    Comment: Performed at [facility] [address] [place], [place] [address]  Hemoglobin and hematocrit, blood     Status: Abnormal   Collection Time: 11/24/17  5:19 PM  Result Value Ref Range   Hemoglobin 11.0 (L) 12.0 - 15.0 g/dL   HCT 33.6 (L) 36.0 - 46.0 %    Comment: Performed at [facility] [address] [place], [place] [address]  Glucose, capillary     Status: None   Collection Time: 11/24/17  9:18 PM  Result Value Ref Range   Glucose-Capillary 95 65 - 99 mg/dL  Troponin I     Status: None   Collection Time: 11/24/17 11:28 PM  Result Value Ref Range   Troponin I <0.03 <0.03 ng/mL    Comment: Performed at [facility] [address] [place], [place] [address]  Renal function panel     Status:  Abnormal   Collection Time: 11/25/17  4:57 AM  Result Value Ref Range   Sodium 141 135 - 145 mmol/L   Potassium 4.1 3.5 - 5.1 mmol/L   Chloride 112 (H) 101 - 111 mmol/L   CO2 17 (L)  22 - 32 mmol/L   Glucose, Bld 82 65 - 99 mg/dL   BUN 86 (H) 6 - 20 mg/dL   Creatinine, Ser 2.96 (H) 0.44 - 1.00 mg/dL   Calcium 8.6 (L) 8.9 - 10.3 mg/dL   Phosphorus 5.2 (H) 2.5 - 4.6 mg/dL   Albumin 2.3 (L) 3.5 - 5.0 g/dL   GFR calc non Af Amer 17 (L) >60 mL/min   GFR calc Af Amer 19 (L) >60 mL/min    Comment: (NOTE) The eGFR has been calculated using the CKD EPI equation. This calculation has not been validated in all clinical situations. eGFR's persistently <60 mL/min signify possible Chronic Kidney Disease.    Anion gap 12 5 - 15    Comment: Performed at [facility] [address] [place], [place] [address]  CBC     Status: Abnormal   Collection Time: 11/25/17  4:57 AM  Result Value Ref Range   WBC 11.4 (H) 4.0 - 10.5 K/uL   RBC 3.91 3.87 - 5.11 MIL/uL   Hemoglobin 10.9 (L) 12.0 - 15.0 g/dL   HCT 33.8 (L) 36.0 - 46.0 %   MCV 86.4 78.0 - 100.0 fL   MCH 27.9 26.0 - 34.0 pg   MCHC 32.2 30.0 - 36.0 g/dL   RDW 16.7 (H) 11.5 - 15.5 %   Platelets 362 150 - 400 K/uL    Comment: Performed at [facility], [place] [address], [place], [place] [address]  Glucose, capillary     Status: None   Collection Time: 11/25/17  6:44 AM  Result Value Ref Range   Glucose-Capillary 73 65 - 99 mg/dL  Glucose, capillary     Status: None   Collection Time: 11/25/17  7:06 AM  Result Value Ref Range   Glucose-Capillary 99 65 - 99 mg/dL  Glucose, capillary     Status: None   Collection Time: 11/25/17 11:42 AM  Result Value Ref Range   Glucose-Capillary 98 65 - 99 mg/dL   Comment 1 Notify RN   Glucose, capillary     Status: Abnormal   Collection Time: 11/25/17  5:08 PM  Result Value Ref Range   Glucose-Capillary 123 (H) 65 - 99 mg/dL   Comment 1 Notify RN   Glucose, capillary     Status: Abnormal    Collection Time: 11/25/17 10:03 PM  Result Value Ref Range   Glucose-Capillary 134 (H) 65 - 99 mg/dL   Comment 1 Notify RN   Glucose, capillary     Status: None   Collection Time: 11/26/17  6:29 AM  Result Value Ref Range   Glucose-Capillary 92 65 - 99 mg/dL  Renal function panel     Status: Abnormal   Collection Time: 11/26/17  6:44 AM  Result Value Ref Range   Sodium 143 135 - 145 mmol/L   Potassium 4.2 3.5 - 5.1 mmol/L   Chloride 111 101 - 111 mmol/L   CO2 20 (L) 22 - 32 mmol/L   Glucose, Bld 103 (H) 65 - 99 mg/dL   BUN 81 (H) 6 - 20 mg/dL   Creatinine, Ser 2.98 (H) 0.44 - 1.00 mg/dL   Calcium 8.9 8.9 - 10.3 mg/dL   Phosphorus 4.6 2.5 - 4.6 mg/dL   Albumin 2.5 (L) 3.5 - 5.0 g/dL   GFR calc non Af Amer 17 (L) >60 mL/min   GFR calc Af Amer 19 (L) >60 mL/min    Comment: (NOTE) The eGFR has been calculated using the CKD  EPI equation. This calculation has not been validated in all clinical situations. eGFR's persistently <60 mL/min signify possible Chronic Kidney Disease.    Anion gap 12 5 - 15    Comment: Performed at [facility] [address] [place], [place] [address]  CBC with Differential/Platelet     Status: Abnormal   Collection Time: 11/26/17  6:44 AM  Result Value Ref Range   WBC 10.5 4.0 - 10.5 K/uL   RBC 4.06 3.87 - 5.11 MIL/uL   Hemoglobin 11.5 (L) 12.0 - 15.0 g/dL   HCT 35.1 (L) 36.0 - 46.0 %   MCV 86.5 78.0 - 100.0 fL   MCH 28.3 26.0 - 34.0 pg   MCHC 32.8 30.0 - 36.0 g/dL   RDW 16.7 (H) 11.5 - 15.5 %   Platelets 386 150 - 400 K/uL   Neutrophils Relative % 54 %   Neutro Abs 5.6 1.7 - 7.7 K/uL   Lymphocytes Relative 34 %   Lymphs Abs 3.5 0.7 - 4.0 K/uL   Monocytes Relative 8 %   Monocytes Absolute 0.9 0.1 - 1.0 K/uL   Eosinophils Relative 3 %   Eosinophils Absolute 0.4 0.0 - 0.7 K/uL   Basophils Relative 1 %   Basophils Absolute 0.1 0.0 - 0.1 K/uL    Comment: Performed at [facility] [address], [place], [place] [address]     General: NAD. Vital signs reviewed. HEENT: Normocephalic, atraumatic.  Cardio: RRR. No JVD. Resp: CTA B/L and unlaboured GI: BS positive and ND Musc/Skel:  No tenderness. Lower extremity edema. Neuro: Alert and oriented 3 with increased time Motor:  LUE: 5/5 proximal to distal RUE: 4+/5 proximal to distal  RLE: HF: 2+/5, ADF/PF 2/5 (stable) LLE: HF 3-/5, ADF/PF 2+/5 (stable) Skin: Intact. Warm and dry.  Psych: Flat, cooperative.   Assessment/Plan: 1. Functional deficits secondary to debility and encephalopathy which require 3+ hours per day of interdisciplinary therapy in a comprehensive inpatient rehab setting. Physiatrist is providing close team supervision and 24 hour management of active medical problems listed below. Physiatrist and rehab team continue to assess barriers to discharge/monitor patient progress toward functional and medical goals. FIM: Function - Bathing Position: Bed Body parts bathed by patient: Right arm, Left arm, Chest, Abdomen, Right upper leg, Left upper leg Body parts bathed by helper: Front perineal area, Buttocks, Right lower leg, Left lower leg, Back Bathing not applicable: Left upper leg, Left lower leg, Right lower leg, Right upper leg Assist Level: (Mod assist)  Function- Upper Body Dressing/Undressing What is the patient wearing?: Pull over shirt/dress Bra - Perfomed by patient: Thread/unthread left bra strap Bra - Perfomed by helper: Thread/unthread right bra strap, Hook/unhook bra (pull down sports bra), Thread/unthread left bra strap Pull over shirt/dress - Perfomed by patient: Thread/unthread right sleeve, Thread/unthread left sleeve, Put head through opening, Pull shirt over trunk Pull over shirt/dress - Perfomed by helper: Pull shirt over trunk Assist Level: Supervision or verbal cues, Set up Set up : To obtain clothing/put away Function - Lower Body Dressing/Undressing What is the patient wearing?: Pants, Non-skid slipper  socks Position: Bed Underwear - Performed by helper: Thread/unthread right underwear leg, Thread/unthread left underwear leg Pants- Performed by patient: Pull pants up/down Pants- Performed by helper: Thread/unthread left pants leg, Thread/unthread right pants leg Non-skid slipper socks- Performed by helper: Don/doff right sock, Don/doff left sock Assist for footwear: Maximal assist Assist for lower body dressing: (Total assist) Set up : To obtain clothing/put away  Function - Toileting Toileting activity did not occur: No continent bowel/bladder event Toileting steps completed by helper: Adjust clothing prior to toileting, Performs perineal hygiene, Adjust clothing after toileting(per Haskell Flirt Aquit, NT report) Assist level: Touching or steadying assistance (Pt.75%)(per Haskell Flirt Aquit, NT report)  Function - Air cabin crew transfer activity did not occur: Safety/medical concerns Toilet transfer assistive device: Drop arm commode, Sliding board, Bedside commode Assist level to toilet: 2 helpers(per [place], NT report) Assist level to bedside commode (at bedside): Total assist (Pt < 25%) Assist level from bedside commode (at bedside): Total assist (Pt < 25%)  Function - Chair/bed transfer Chair/bed transfer method: Lateral scoot Chair/bed transfer assist level: Moderate assist (Pt 50 - 74%/lift or lower) Chair/bed transfer assistive device: Sliding board Chair/bed transfer details: Manual facilitation for placement, Manual facilitation for weight shifting, Verbal cues for technique  Function - Locomotion: Wheelchair Will patient use wheelchair at discharge?: Yes Type: Manual Max wheelchair distance: 25 Assist Level: Touching or steadying assistance (Pt > 75%) Assist Level: Touching or steadying assistance (Pt > 75%) Wheel 150 feet activity did not occur: Safety/medical concerns Assist Level: Touching or steadying assistance (Pt > 75%) Turns around,maneuvers to  table,bed, and toilet,negotiates 3% grade,maneuvers on rugs and over doorsills: No Function - Locomotion: Ambulation Ambulation activity did not occur: Safety/medical concerns Walk 10 feet activity did not occur: Safety/medical concerns Walk 50 feet with 2 turns activity did not occur: Safety/medical concerns Walk 150 feet activity did not occur: Safety/medical concerns Walk 10 feet on uneven surfaces activity did not occur: Safety/medical concerns  Function - Comprehension Comprehension: Auditory Comprehension assist level: Follows basic conversation/direction with extra time/assistive device  Function - Expression Expression: Verbal Expression assist level: Expresses basic needs/ideas: With extra time/assistive device  Function - Social Interaction Social Interaction assist level: Interacts appropriately 75 - 89% of the time - Needs redirection for appropriate language or to initiate interaction.  Function - Problem Solving Problem solving assist level: Solves basic 75 - 89% of the time/requires cueing 10 - 24% of the time  Function - Memory Memory assist level: Recognizes or recalls 50 - 74% of the time/requires cueing 25 - 49% of the time Patient normally able to recall (first 3 days only): Current season, Location of own room, Staff names and faces, That he or she is in a hospital  Medical Problem List and Plan: 1.Functional and mobility deficitssecondary to debility and encephalopathy from multiple medical issues   Cont CIR   Repeat MRI brain and T-spine suggesting stable/improvement. Neurology signed off.   Head CT ordered for today due to ?head trauma 2. DVT Prophylaxis/Anticoagulation: Pharmaceutical:Lovenox   Right peroneal DVT, patient too high risk for renal complications with CTA. VQ scan likely inconclusive given edema. Discussed with Pulm, will monitor for now 3. Pain Management:tylenol prn 4. Mood:LCSW to follow for evaluation and support.   team to continue  to provide support    xanax prn for anxiety 5. Neuropsych: This patientis not fullycapable of making decisions onherown behalf. 6. Skin/Wound Care:Air mattress for pressure relief measures. Local measures to help managesacral /labialMASD PRAFO's for bilateral LE's 7. Fluids/Electrolytes/Nutrition:Monitor I/O.    Continue to offer supplements to help with protein calorie malnutrition.   Cont Remeron 15 qhs 8. C diff colitis: Resolved   Vancomycincompleted 2/6 Continue enteric precautions  Repeat C diff PCR + but toxin negative.     Appreciate ID recs, no plans to treat at present 9. HTN: Monitor BP bid.  Nifedipine bid increased to 60 on 2/6   Imdur increased to 90 on 2/4   Metoprolol increased to 100 BID on 2/3   Hydralazine 10 3 times a day started on 2/13, increased to 25 on 2/14   Elevated on 2/22 Vitals:   11/26/17 0152 11/26/17 0550  BP: (!) 171/90 (!) 161/83  Pulse: 87 91  Resp: 16 16  Temp: 97.9 F (36.6 C) 98.3 F (36.8 C)  SpO2: 93% 93%  10 T2DM with retinopathy, neuropathy, andnephropathy:Monitor BS ac/hs.    SSI for elevated BS CBG (last 3)  Recent Labs    11/25/17 1708 11/25/17 2203 11/26/17 0629  GLUCAP 123* 134* 92    Relatively controlled on 2/22 11. H/o depression: Continue Effexor and Remeron.  12. Neurogenic bladder:Has chronic indwelling catheter for 2+ months.   Foley out, I/O caths, keep strict I/Os.  13.AKI onCKD with history of overload:    Cr 2.98 on 2/22, IVF started by nephrology, d/ced due to fluid overload, no real changes per Nephro   Daily Labs    Encourage fluids   Appreciate nephrology recs   Cont to monitor 14. Diffuse CAD: treated medically with Metoprolol, Zetia and Plavix. 15.  ABLA on anemia of chronic disease:    Stool OB now neg   Hb 11.5 on 2/22   Appreciate GI recs, EGD/colonoscopy performed on 2/10 showing a total of 5 duodenal ulcers, which are presumed to be a source of  bleeding.  16. Leukocytosis:    WBCs 10.5 on 2/22   Afebrile   Labs ordered for Monday   Repeat UA relatively unremarkable for infection, Ucx multi species  17. Hypoalbuminemia   Cont supplement 18. Hyperkalemia: Resolved   Cont to monitor    K+ 40 daily x2 days due to lasix, changed to Bumex on 2/22 19. Acute lower UTI   UA is equivocal, urine culture with Pseudomonas   Cipro completed 2/11-2/13 20.  GI bleed   Believed to be secondary to duodenal ulcers   Appreciate GI recs, 40 mg Protonix twice daily    H. pylori serology negative   Polyp biopsy on colonoscopy results to be sent patient for GI 21. Dyspnea   Continue Lasix  CXR reviewed, suggesting fluid overload 22. Syncope:   Appreciate teaching service recs, likely vasovagal   Additional IV dose lasix given on 2/20   ECG/CE reviewed, no significant acute concerns.  Cardiac enzymes negative, ECG  Repeat CXR reviewed, showing opacities. Appreciate pulmonary recs suggesting likely fluid overload and less likely pneumonia.   23. Pulmonary edema   Continue Lasix   Chest x-ray reviewed, showing persistent edema    LOS (Days) 21 A FACE TO FACE EVALUATION WAS PERFORMED  Lynora Dymond Lorie Phenix 11/26/2017, 8:55 AM

## 2017-11-26 NOTE — Progress Notes (Signed)
Physical Therapy Note  Patient Details  Name: Laura [name] MRN: [id number] Date of Birth: 15-Jun-1961 Today's Date: 11/26/2017    Time: 406 010 4635 55 minutes  1:1 No c/o pain.  Pt missed initial 15 minutes of session due to in/out cath by nursing.  Pt performs bed mobility with mod A.  Sliding board transfers to w/c and mat with max A, total cuing for forward lean and assist to lift bottom to slide across board.  pt seated on mat in gym with 4'' step under LEs to promote 90-90-90 sitting.  Seated peg board task with total A for organization and following directions.  Seated ball toss and trunk diagonals and UE therex with supervision for sitting balance, improving UE and core strength. Pt able to sit edge of mat x 25 minutes.  W/c mobility with bilat UEs with mod cuing, supervision for propulsion 2 x 50'.  Pt max verbal and visual cues to avoid obstacles during w/c propulsion.   Laura [name] 11/26/2017, 10:41 AM

## 2017-11-27 ENCOUNTER — Inpatient Hospital Stay (HOSPITAL_COMMUNITY): Payer: No Typology Code available for payment source | Admitting: Occupational Therapy

## 2017-11-27 DIAGNOSIS — J810 Acute pulmonary edema: Secondary | ICD-10-CM

## 2017-11-27 LAB — RENAL FUNCTION PANEL
Albumin: 2.3 g/dL — ABNORMAL LOW (ref 3.5–5.0)
Anion gap: 11 (ref 5–15)
BUN: 87 mg/dL — AB (ref 6–20)
CHLORIDE: 110 mmol/L (ref 101–111)
CO2: 22 mmol/L (ref 22–32)
CREATININE: 2.93 mg/dL — AB (ref 0.44–1.00)
Calcium: 8.6 mg/dL — ABNORMAL LOW (ref 8.9–10.3)
GFR calc Af Amer: 20 mL/min — ABNORMAL LOW (ref >60)
GFR, EST NON AFRICAN AMERICAN: 17 mL/min — AB (ref >60)
GLUCOSE: 104 mg/dL — AB (ref 65–99)
Phosphorus: 4.9 mg/dL — ABNORMAL HIGH (ref 2.5–4.6)
Potassium: 4 mmol/L (ref 3.5–5.1)
Sodium: 143 mmol/L (ref 135–145)

## 2017-11-27 LAB — GLUCOSE, CAPILLARY
GLUCOSE-CAPILLARY: 145 mg/dL — AB (ref 65–99)
GLUCOSE-CAPILLARY: 132 mg/dL — AB (ref 65–99)
Glucose-Capillary: 89 mg/dL (ref 65–99)
Glucose-Capillary: 111 mg/dL — ABNORMAL HIGH (ref 65–99)

## 2017-11-27 NOTE — Progress Notes (Signed)
Occupational Therapy Session Note  Patient Details  Name: Laura [name] MRN: [id number] Date of Birth: Oct 28, 1960  Today's Date: 11/27/2017 OT Individual Time: 0910-1000 OT Individual Time Calculation (min): 50 min    Skilled Therapeutic Interventions/Progress Updates:  Though patient c/o fatigue and pain in her right DVT leg when she moved in her w/c (patient in w/c upon approachfor OT),   She concurred to complete grooming (S) in w/c at sink.    Otherwise, she worked on w/c pushup, trunk control and bilateral upper extremityand R lower extremity strengthening.   Patientstated she was informednot to stand or move her right DVT leg.   However, nurse indicated no mobility or R LE restrictions ordered as far as she knew.   Patient was left with seat belt, callbell andphonewithin reach at theendof the session.   Therapy Documentation Precautions:  Precautions Precautions: Fall Precaution Comments: monitor blood pressure Restrictions Weight Bearing Restrictions: Yes General: General OT Amount of Missed Time: 10 Minutes(10)  Pain:  See Function Navigator for Current Functional Status.   Therapy/Group: Individual Therapy  Alfredia Ferguson [facility] 11/27/2017, 2:53 PM

## 2017-11-27 NOTE — Progress Notes (Signed)
S:  Improved UOP yesterday at least 3.4L on Bumex AM labs pending  O:BP (!) 146/66 (BP Location: Left Arm)   Pulse 85   Temp 98.4 F (36.9 C) (Oral)   Resp 16   Ht 4' 11"  (1.499 m)   Wt 46.3 kg (102 lb)   SpO2 94%   BMI 20.60 kg/m   Intake/Output Summary (Last 24 hours) at 11/27/2017 0720 Last data filed at 11/27/2017 0230 Gross per 24 hour  Intake 360 ml  Output 3350 ml  Net -2990 ml   Intake/Output: I/O last 3 completed shifts: In: 360 [P.O.:360] Out: 4100 [Urine:4100]  Intake/Output this shift:  No intake/output data recorded. Weight change: -9.979 kg () Gen: NAD CVS: no rub Resp: cta Abd: benign Ext: 3+ edema b/l  Recent Labs  Lab 11/21/17 0719 11/22/17 0943 11/23/17 0451 11/24/17 0540 11/25/17 0457 11/26/17 0644  NA 140 138 139 140 141 143  K 4.2 4.1 4.1 3.6 4.1 4.2  CL 109 110 110 110 112* 111  CO2 19* 17* 19* 19* 17* 20*  GLUCOSE 104* 80 83 80 82 103*  BUN 82* 81* 83* 87* 86* 81*  CREATININE 2.73* 2.86* 2.82* 2.93* 2.96* 2.98*  ALBUMIN  --  2.4* 2.2* 2.2* 2.3* 2.5*  CALCIUM 8.4* 8.4* 8.5* 8.5* 8.6* 8.9  PHOS  --  5.4* 5.6* 5.4* 5.2* 4.6   Liver Function Tests: Recent Labs  Lab 11/24/17 0540 11/25/17 0457 11/26/17 0644  ALBUMIN 2.2* 2.3* 2.5*   No results for input(s): LIPASE, AMYLASE in the last 168 hours. No results for input(s): AMMONIA in the last 168 hours. CBC: Recent Labs  Lab 11/22/17 0943 11/24/17 0540 11/24/17 1719 11/25/17 0457 11/26/17 0644  WBC 8.9 7.9  --  11.4* 10.5  NEUTROABS 5.1 3.8  --   --  5.6  HGB 11.8* 10.7* 11.0* 10.9* 11.5*  HCT 35.6* 32.4* 33.6* 33.8* 35.1*  MCV 87.5 86.4  --  86.4 86.5  PLT 414* 375  --  362 386   Cardiac Enzymes: Recent Labs  Lab 11/24/17 1138 11/24/17 1657 11/24/17 2328  TROPONINI <0.03 <0.03 <0.03   CBG: Recent Labs  Lab 11/26/17 0629 11/26/17 1153 11/26/17 1704 11/26/17 2128 11/27/17 0652  GLUCAP 92 169* 164* 165* 89    Iron Studies: No results for input(s): IRON, TIBC,  TRANSFERRIN, FERRITIN in the last 72 hours. Studies/Results: Dg Chest 2 View  Result Date: 11/26/2017 CLINICAL DATA:  Leukocytosis EXAM: CHEST  2 VIEW COMPARISON:  11/24/2017 FINDINGS: Cardiomegaly with vascular congestion. Bilateral perihilar and lower lobe opacities with small bilateral effusions. Findings could reflect edema or infection. IMPRESSION: Bilateral lower lobe airspace opacities with layering effusions. Findings could reflect edema or infection. No real change since prior study. Electronically Signed   By: Rolm Baptise M.D.   On: 11/26/2017 07:35   Ct Head Wo Contrast  Result Date: 11/26/2017 CLINICAL DATA:  Fall.  Hit top of head. EXAM: CT HEAD WITHOUT CONTRAST TECHNIQUE: Contiguous axial images were obtained from the base of the skull through the vertex without intravenous contrast. COMPARISON:  11/11/2017 FINDINGS: Brain: Chronic right basal ganglia lacunar infarct noted. Mild patchy low attenuation in the subcortical and periventricular white matter noted compatible with chronic small vessel ischemic change. No evidence for acute brain infarct, intracranial hemorrhage or mass. Vascular: No hyperdense vessel or unexpected calcification. Skull: Normal. Negative for fracture or focal lesion. Sinuses/Orbits: There is chronic appearing asymmetric opacification of the right maxillary sinus, there is also chronic opacification of the  sphenoid sinus. Other: None IMPRESSION: 1. No acute intracranial abnormality. 2. Chronic small vessel ischemic change. Old right basal ganglia infarct noted. 3. 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 Electronically Signed   By: Kerby Moors M.D.   On: 11/26/2017 16:56   . bumetanide  2 mg Oral BID  . collagenase   Topical BID  . enoxaparin (LOVENOX) injection  30 mg Subcutaneous Q24H  . ezetimibe  10 mg Oral Daily  . feeding supplement (NEPRO CARB STEADY)  237 mL Oral TID WC  . feeding supplement (PRO-STAT SUGAR FREE 64)  30 mL Oral  BID  . hydrALAZINE  25 mg Oral Q8H  . insulin aspart  0-9 Units Subcutaneous TID WC  . isosorbide mononitrate  90 mg Oral Daily  . metoprolol tartrate  100 mg Oral BID  . mirtazapine  15 mg Oral QHS  . multivitamin  1 tablet Oral QHS  . NIFEdipine  60 mg Oral BID  . pantoprazole  40 mg Oral BID AC  . polycarbophil  1,250 mg Oral BID AC  . venlafaxine XR  150 mg Oral Q breakfast    BMET    Component Value Date/Time   NA 143 11/26/2017 0644   K 4.2 11/26/2017 0644   CL 111 11/26/2017 0644   CO2 20 (L) 11/26/2017 0644   GLUCOSE 103 (H) 11/26/2017 0644   BUN 81 (H) 11/26/2017 0644   CREATININE 2.98 (H) 11/26/2017 0644   CALCIUM 8.9 11/26/2017 0644   GFRNONAA 17 (L) 11/26/2017 0644   GFRAA 19 (L) 11/26/2017 0644   CBC    Component Value Date/Time   WBC 10.5 11/26/2017 0644   RBC 4.06 11/26/2017 0644   HGB 11.5 (L) 11/26/2017 0644   HCT 35.1 (L) 11/26/2017 0644   PLT 386 11/26/2017 0644   MCV 86.5 11/26/2017 0644   MCH 28.3 11/26/2017 0644   MCHC 32.8 11/26/2017 0644   RDW 16.7 (H) 11/26/2017 0644   LYMPHSABS 3.5 11/26/2017 0644   MONOABS 0.9 11/26/2017 0644   EOSABS 0.4 11/26/2017 0644   BASOSABS 0.1 11/26/2017 0644      Assessment/Plan:  1. AKI/CKD stage 4- likley baseline diabetic nephropathy and worsening with acute health issues.   2. ABLA due to GI Bleed and anemia of CKD stage 4- s/p transfusion 3. Normal AG Met Acidosis stopped [facility] 2/22 4. Hyperkalemia  K in 4s -- resolved no binder needed  5. CDI - s/p completion of po vancomycin for recent C Diff 6. HTN- stable 7. Severe deconditioning/debilitation- cont with PT/OT with inpatient rehab.  Cont Bumex.  Cont to follow. Daily RFP.  Pearson Grippe MD [phone]

## 2017-11-27 NOTE — Progress Notes (Signed)
Subjective/Complaints: Patient slept well overnight.  She denies any significant pain.  She is concerned about bilateral lower extremity edema and recently diagnosed DVT.  No other complaints.  Objective: Vital Signs: Blood pressure (!) 146/66, pulse 85, temperature 98.4 F (36.9 C), temperature source Oral, resp. rate 16, height 4\' 11"  (1.499 m), weight 102 lb (46.3 kg), SpO2 94 %.  No acute distress HEENT exam atraumatic, normocephalic Cardiac exam S1 and S2 are regular without gallop Chest x-ray to auscultation without any increased work of breathing. Abdominal exam active bowel sounds, soft Extremities with 2+ bilateral lower extremity edema Neurologic: She is alert.  Assessment/Plan: 1. Functional deficits secondary to debility and encephalopathy   Medical Problem List and Plan: 1.Functional and mobility deficitssecondary to debility and encephalopathy from multiple medical issues   Cont CIR   Repeat MRI brain and T-spine suggesting stable/improvement. Neurology signed off.   CT of the head yesterday without any acute intracranial abnormality.  There is chronic small vessel ischemic changes and old right basal ganglia infarct.  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 2. DVT Prophylaxis/Anticoagulation: Pharmaceutical:Lovenox   Right peroneal DVT, patient too high risk for renal complications with CTA. VQ scan likely inconclusive given edema.  3. Pain Management:tylenol prn 4. Mood:LCSW to follow for evaluation and support.   team to continue to provide support    xanax prn for anxiety 5. Neuropsych: This patientis not fullycapable of making decisions onherown behalf. 6. Skin/Wound Care:Air mattress for pressure relief measures. Local measures to help managesacral /labialMASD PRAFO's for bilateral LE's 7. Fluids/Electrolytes/Nutrition:Monitor I/O.    Continue to offer supplements to help with protein calorie malnutrition.   Cont Remeron 15 qhs 8. C diff colitis: Resolved-contact precautions.   Vancomycincompleted 2/6 Continue enteric precautions  Repeat C diff PCR + but toxin negative.     Appreciate ID recs, no plans to treat at present 9. HTN: Monitor BP bid. 142/73-154/77 Continue current medications.  May need to be adjusted tomorrow.    Vitals:   11/26/17 2104 11/27/17 0218  BP: (!) 142/73 (!) 146/66  Pulse:  85  Resp:  16  Temp:  98.4 F (36.9 C)  SpO2:  94%  10 T2DM with retinopathy, neuropathy, andnephropathy:Monitor BS ac/hs.    SSI for elevated BS CBG (last 3)  Recent Labs    11/26/17 1704 11/26/17 2128 11/27/17 0652  GLUCAP 164* 165* 89    Relatively controlled on 2/23 11. H/o depression: Continue Effexor and Remeron.  12. Neurogenic bladder:Has chronic indwelling catheter for 2+ months.   Foley out, I/O caths, keep strict I/Os.  13.AKI onCKD with history of overload:    Cr 2.98 on 2/22, IVF started by nephrology, d/ced due to fluid overload, no real changes per Nephro   Daily Labs    Encourage fluids   Appreciate nephrology recs   Cont to monitor 14. Diffuse CAD: treated medically with Metoprolol, Zetia and Plavix. 15.  ABLA on anemia of chronic disease:    Stool OB now neg   Hb 11.5 on 2/22   Appreciate GI recs, EGD/colonoscopy performed on 2/10 showing a total of 5 duodenal ulcers, which are presumed to be a source of bleeding.  16. Leukocytosis:    WBCs 10.5 on 2/22   Afebrile   Labs ordered for Monday   Repeat UA relatively unremarkable for infection, Ucx multi species  17. Hypoalbuminemia   Cont supplement 18. Hyperkalemia: Resolved   Cont to monitor    K+ 40 daily x2 days  due to lasix, changed to Bumex on 2/22 19. Acute lower UTI   UA is equivocal, urine culture with Pseudomonas   Cipro completed 2/11-2/13 20.  GI bleed   Believed to be secondary to duodenal ulcers   Appreciate GI recs, 40 mg Protonix twice daily    H. pylori serology  negative   Polyp biopsy on colonoscopy results to be sent patient for GI 21. Dyspnea   Continue Lasix  CXR reviewed, suggesting fluid overload 22. Syncope:   No recurrence.    23. Pulmonary edema   Continue Lasix   Chest x-ray reviewed, showing persistent edema    LOS (Days) 22 A FACE TO FACE EVALUATION WAS PERFORMED  Bruce H Swords 11/27/2017, 8:22 AM

## 2017-11-28 ENCOUNTER — Inpatient Hospital Stay (HOSPITAL_COMMUNITY): Payer: No Typology Code available for payment source | Admitting: Physical Therapy

## 2017-11-28 LAB — RENAL FUNCTION PANEL
ALBUMIN: 2.3 g/dL — AB (ref 3.5–5.0)
ANION GAP: 10 (ref 5–15)
BUN: 89 mg/dL — ABNORMAL HIGH (ref 6–20)
CO2: 22 mmol/L (ref 22–32)
Calcium: 8.4 mg/dL — ABNORMAL LOW (ref 8.9–10.3)
Chloride: 108 mmol/L (ref 101–111)
Creatinine, Ser: 2.99 mg/dL — ABNORMAL HIGH (ref 0.44–1.00)
GFR calc non Af Amer: 16 mL/min — ABNORMAL LOW (ref >60)
GFR, EST AFRICAN AMERICAN: 19 mL/min — AB (ref >60)
GLUCOSE: 99 mg/dL (ref 65–99)
PHOSPHORUS: 4.6 mg/dL (ref 2.5–4.6)
POTASSIUM: 3.7 mmol/L (ref 3.5–5.1)
Sodium: 140 mmol/L (ref 135–145)

## 2017-11-28 LAB — GLUCOSE, CAPILLARY
GLUCOSE-CAPILLARY: 84 mg/dL (ref 65–99)
GLUCOSE-CAPILLARY: 137 mg/dL — AB (ref 65–99)
GLUCOSE-CAPILLARY: 155 mg/dL — AB (ref 65–99)
Glucose-Capillary: 131 mg/dL — ABNORMAL HIGH (ref 65–99)

## 2017-11-28 NOTE — Progress Notes (Signed)
S:  Further good UOP Stable labs this AM K 3.7 Edema lessened  O:BP (!) 143/85 (BP Location: Left Arm)   Pulse 92   Temp 97.9 F (36.6 C) (Oral)   Resp 18   Ht 4' 11"  (1.499 m)   Wt 46.7 kg (103 lb)   SpO2 93%   BMI 20.80 kg/m   Intake/Output Summary (Last 24 hours) at 11/28/2017 1026 Last data filed at 11/28/2017 0800 Gross per 24 hour  Intake 480 ml  Output 3650 ml  Net -3170 ml   Intake/Output: I/O last 3 completed shifts: In: 540 [P.O.:540] Out: 4100 [Urine:4100]  Intake/Output this shift:  Total I/O In: 120 [P.O.:120] Out: -  Weight change: -4.99 kg () Gen: NAD CVS: no rub Resp: cta Abd: benign Ext: 3+ edema b/l  Recent Labs  Lab 11/22/17 0943 11/23/17 0451 11/24/17 0540 11/25/17 0457 11/26/17 0644 11/27/17 0703 11/28/17 0602  NA 138 139 140 141 143 143 140  K 4.1 4.1 3.6 4.1 4.2 4.0 3.7  CL 110 110 110 112* 111 110 108  CO2 17* 19* 19* 17* 20* 22 22  GLUCOSE 80 83 80 82 103* 104* 99  BUN 81* 83* 87* 86* 81* 87* 89*  CREATININE 2.86* 2.82* 2.93* 2.96* 2.98* 2.93* 2.99*  ALBUMIN 2.4* 2.2* 2.2* 2.3* 2.5* 2.3* 2.3*  CALCIUM 8.4* 8.5* 8.5* 8.6* 8.9 8.6* 8.4*  PHOS 5.4* 5.6* 5.4* 5.2* 4.6 4.9* 4.6   Liver Function Tests: Recent Labs  Lab 11/26/17 0644 11/27/17 0703 11/28/17 0602  ALBUMIN 2.5* 2.3* 2.3*   No results for input(s): LIPASE, AMYLASE in the last 168 hours. No results for input(s): AMMONIA in the last 168 hours. CBC: Recent Labs  Lab 11/22/17 0943 11/24/17 0540 11/24/17 1719 11/25/17 0457 11/26/17 0644  WBC 8.9 7.9  --  11.4* 10.5  NEUTROABS 5.1 3.8  --   --  5.6  HGB 11.8* 10.7* 11.0* 10.9* 11.5*  HCT 35.6* 32.4* 33.6* 33.8* 35.1*  MCV 87.5 86.4  --  86.4 86.5  PLT 414* 375  --  362 386   Cardiac Enzymes: Recent Labs  Lab 11/24/17 1138 11/24/17 1657 11/24/17 2328  TROPONINI <0.03 <0.03 <0.03   CBG: Recent Labs  Lab 11/27/17 0652 11/27/17 1158 11/27/17 1620 11/27/17 2104 11/28/17 0650  GLUCAP 89 111* 145* 132*  84    Iron Studies: No results for input(s): IRON, TIBC, TRANSFERRIN, FERRITIN in the last 72 hours. Studies/Results: Ct Head Wo Contrast  Result Date: 11/26/2017 CLINICAL DATA:  Fall.  Hit top of head. EXAM: CT HEAD WITHOUT CONTRAST TECHNIQUE: Contiguous axial images were obtained from the base of the skull through the vertex without intravenous contrast. COMPARISON:  11/11/2017 FINDINGS: Brain: Chronic right basal ganglia lacunar infarct noted. Mild patchy low attenuation in the subcortical and periventricular white matter noted compatible with chronic small vessel ischemic change. No evidence for acute brain infarct, intracranial hemorrhage or mass. Vascular: No hyperdense vessel or unexpected calcification. Skull: Normal. Negative for fracture or focal lesion. Sinuses/Orbits: There is chronic appearing asymmetric opacification of the right maxillary sinus, there is also chronic opacification of the sphenoid sinus. Other: None IMPRESSION: 1. No acute intracranial abnormality. 2. Chronic small vessel ischemic change. Old right basal ganglia infarct noted. 3. 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 Electronically Signed   By: Kerby Moors M.D.   On: 11/26/2017 16:56   . bumetanide  2 mg Oral BID  . collagenase   Topical BID  . enoxaparin (LOVENOX) injection  30 mg Subcutaneous Q24H  . ezetimibe  10 mg Oral Daily  . feeding supplement (NEPRO CARB STEADY)  237 mL Oral TID WC  . feeding supplement (PRO-STAT SUGAR FREE 64)  30 mL Oral BID  . hydrALAZINE  25 mg Oral Q8H  . insulin aspart  0-9 Units Subcutaneous TID WC  . isosorbide mononitrate  90 mg Oral Daily  . metoprolol tartrate  100 mg Oral BID  . mirtazapine  15 mg Oral QHS  . multivitamin  1 tablet Oral QHS  . NIFEdipine  60 mg Oral BID  . pantoprazole  40 mg Oral BID AC  . polycarbophil  1,250 mg Oral BID AC  . venlafaxine XR  150 mg Oral Q breakfast    BMET    Component Value Date/Time   NA 140 11/28/2017  0602   K 3.7 11/28/2017 0602   CL 108 11/28/2017 0602   CO2 22 11/28/2017 0602   GLUCOSE 99 11/28/2017 0602   BUN 89 (H) 11/28/2017 0602   CREATININE 2.99 (H) 11/28/2017 0602   CALCIUM 8.4 (L) 11/28/2017 0602   GFRNONAA 16 (L) 11/28/2017 0602   GFRAA 19 (L) 11/28/2017 0602   CBC    Component Value Date/Time   WBC 10.5 11/26/2017 0644   RBC 4.06 11/26/2017 0644   HGB 11.5 (L) 11/26/2017 0644   HCT 35.1 (L) 11/26/2017 0644   PLT 386 11/26/2017 0644   MCV 86.5 11/26/2017 0644   MCH 28.3 11/26/2017 0644   MCHC 32.8 11/26/2017 0644   RDW 16.7 (H) 11/26/2017 0644   LYMPHSABS 3.5 11/26/2017 0644   MONOABS 0.9 11/26/2017 0644   EOSABS 0.4 11/26/2017 0644   BASOSABS 0.1 11/26/2017 0644      Assessment/Plan:  1. AKI/CKD stage 4- likley baseline diabetic nephropathy and worsening with acute health issues.  [place] at her new baseline 2. ABLA due to GI Bleed and anemia of CKD stage 4- s/p transfusion 3. Normal AG Met Acidosis stopped [facility] 2/22 4. Hyperkalemia  K in 4s 5. CDI - s/p completion of po vancomycin for recent C Diff 6. HTN- stable 7. Severe deconditioning/debilitation- cont with PT/OT with inpatient rehab.  Cont Bumex.  Cont to follow. Daily RFP. No further suggestions and will sign off for now. Call with any questions or concerns.  Will need to f/u in our office and I will make arrangements.  Pearson Grippe MD [phone]

## 2017-11-28 NOTE — Progress Notes (Signed)
Subjective/Complaints: Patient slept well overnight.  She has no complaints.  She is thinks that her lower extremity edema has improved.  Objective: Vital Signs: Blood pressure (!) 143/85, pulse 92, temperature 97.9 F (36.6 C), temperature source Oral, resp. rate 18, height 4\' 11"  (1.499 m), weight 103 lb (46.7 kg), SpO2 93 %.  In general she is in no acute distress. HEENT exam atraumatic, normocephalic Cardiac exam S1 and S2 are regular without gallop Chest is clear to auscultation without any increased work of breathing. Abdominal exam active bowel sounds, soft Extremities with 1+ bilateral lower extremity edema extends to the knees bilaterally. Neurologic exam she is alert and oriented. .  Assessment/Plan: 1. Functional deficits secondary to debility and encephalopathy   Medical Problem List and Plan: 1.Functional and mobility deficitssecondary to debility and encephalopathy from multiple medical issues   Cont CIR   Repeat MRI brain and T-spine suggesting stable/improvement. Neurology signed off.    2. DVT Prophylaxis/Anticoagulation: Pharmaceutical:Lovenox   Right peroneal DVT, patient too high risk for renal complications with CTA. VQ scan likely inconclusive given edema.  3. Pain Management:tylenol prn 4. Mood:LCSW to follow for evaluation and support.   team to continue to provide support    xanax prn for anxiety 5. Neuropsych: This patientis not fullycapable of making decisions onherown behalf. 6. Skin/Wound Care:Air mattress for pressure relief measures. Local measures to help managesacral /labialMASD PRAFO's for bilateral LE's 7. Fluids/Electrolytes/Nutrition:Monitor I/O.    Continue to offer supplements to help with protein calorie malnutrition.   Cont Remeron 15 qhs 8. C diff colitis: Resolved-contact precautions.   9. HTN: Monitor BP bid. 126/61-164/83 Continue current medications.  She is on multiple medications.    Vitals:   11/27/17 2051 11/28/17 0601  BP: (!) 164/83 (!) 143/85  Pulse:  92  Resp:  18  Temp:  97.9 F (36.6 C)  SpO2:  93%  10 T2DM with retinopathy, neuropathy, andnephropathy:Monitor BS ac/hs.    SSI for elevated BS CBG (last 3)  Recent Labs    11/27/17 1620 11/27/17 2104 11/28/17 0650  GLUCAP 145* 132* 84    Relatively controlled on 2/24 11. H/o depression: Continue Effexor and Remeron.  12. Neurogenic bladder:Has chronic indwelling catheter for 2+ months.   Foley out, I/O caths, keep strict I/Os.  13.AKI onCKD with history of overload:    Cr 2.98 on 2/22, IVF started by nephrology, d/ced due to fluid overload, no real changes per Nephro   Daily Labs    Encourage fluids   Appreciate nephrology recs   Cont to monitor Lab Results  Component Value Date   CREATININE 2.99 (H) 11/28/2017   CREATININE 2.93 (H) 11/27/2017   CREATININE 2.98 (H) 11/26/2017    14. Diffuse CAD: treated medically with Metoprolol, Zetia and Plavix. 15.  ABLA on anemia of chronic disease:    Stool OB now neg    Lab Results  Component Value Date   HGB 11.5 (L) 11/26/2017    16. Leukocytosis:     Lab Results  Component Value Date   WBC 10.5 11/26/2017   No signs of infection  17. Hypoalbuminemia   Cont supplement 18. Hyperkalemia: Resolved    Lab Results  Component Value Date   K 3.7 11/28/2017   19. Acute lower UTI   resolved 20.  GI bleed   Believed to be secondary to duodenal ulcers   Appreciate GI recs, 40 mg Protonix twice daily    H. pylori serology negative   Polyp biopsy on  colonoscopy results to be sent patient for GI 21. Dyspnea   Continue Lasix  CXR reviewed, suggesting fluid overload 22. Syncope:   No recurrence.    23. Pulmonary edema   Continue Lasix   Chest x-ray reviewed, showing persistent edema    LOS (Days) 23 A FACE TO FACE EVALUATION WAS PERFORMED  Juanluis Guastella H Macklyn Glandon 11/28/2017, 9:05 AM

## 2017-11-28 NOTE — Progress Notes (Signed)
Physical Therapy Note  Patient Details  Name: Laura [name] MRN: [id number] Date of Birth: 04/30/1961 Today's Date: 11/28/2017    Time: 845-926 41 minutes  1:1 No c/o pain.  Session focused on sitting balance and activity tolerance.  Sliding board transfers throughout session with 4'' step under LEs with pt able to transfer with min/mod A to/from recliner, w/c and mat table.  Sitting balance on mat table to ball toss all directions, UE therex with weighted ball and trunk diagonals with weighted ball all with supervision for sitting balance.  w/c mobility with supervision/min A, min/mod cuing for attention to Rt side and to avoid obstacles.  Pt in good spirits today. Pt left in room with alarm set, quick release belt on , needs at hand.   [name] 11/28/2017, 9:26 AM

## 2017-11-29 ENCOUNTER — Inpatient Hospital Stay (HOSPITAL_COMMUNITY): Payer: No Typology Code available for payment source | Admitting: Physical Therapy

## 2017-11-29 ENCOUNTER — Inpatient Hospital Stay (HOSPITAL_COMMUNITY): Payer: No Typology Code available for payment source

## 2017-11-29 ENCOUNTER — Inpatient Hospital Stay (HOSPITAL_COMMUNITY): Payer: No Typology Code available for payment source | Admitting: Occupational Therapy

## 2017-11-29 DIAGNOSIS — M7989 Other specified soft tissue disorders: Secondary | ICD-10-CM

## 2017-11-29 DIAGNOSIS — W19XXXA Unspecified fall, initial encounter: Secondary | ICD-10-CM | POA: Insufficient documentation

## 2017-11-29 DIAGNOSIS — Z86718 Personal history of other venous thrombosis and embolism: Secondary | ICD-10-CM

## 2017-11-29 DIAGNOSIS — N184 Chronic kidney disease, stage 4 (severe): Secondary | ICD-10-CM

## 2017-11-29 LAB — RENAL FUNCTION PANEL
ANION GAP: 12 (ref 5–15)
Albumin: 2.4 g/dL — ABNORMAL LOW (ref 3.5–5.0)
BUN: 87 mg/dL — ABNORMAL HIGH (ref 6–20)
CO2: 22 mmol/L (ref 22–32)
Calcium: 8.6 mg/dL — ABNORMAL LOW (ref 8.9–10.3)
Chloride: 108 mmol/L (ref 101–111)
Creatinine, Ser: 2.98 mg/dL — ABNORMAL HIGH (ref 0.44–1.00)
GFR, EST AFRICAN AMERICAN: 19 mL/min — AB (ref >60)
GFR, EST NON AFRICAN AMERICAN: 17 mL/min — AB (ref >60)
Glucose, Bld: 97 mg/dL (ref 65–99)
Phosphorus: 4.6 mg/dL (ref 2.5–4.6)
Potassium: 3.7 mmol/L (ref 3.5–5.1)
SODIUM: 142 mmol/L (ref 135–145)

## 2017-11-29 LAB — GLUCOSE, CAPILLARY
GLUCOSE-CAPILLARY: 85 mg/dL (ref 65–99)
GLUCOSE-CAPILLARY: 130 mg/dL — AB (ref 65–99)
GLUCOSE-CAPILLARY: 134 mg/dL — AB (ref 65–99)
Glucose-Capillary: 109 mg/dL — ABNORMAL HIGH (ref 65–99)
Glucose-Capillary: 202 mg/dL — ABNORMAL HIGH (ref 65–99)

## 2017-11-29 NOTE — Progress Notes (Signed)
Occupational Therapy Session Note  Patient Details  Name: Laura [name] MRN: [id number] Date of Birth: August 16, 1961  Today's Date: 11/29/2017 OT Individual Time: 1050-1200 OT Individual Time Calculation (min): 70 min    Short Term Goals: Week 3:  OT Short Term Goal 1 (Week 3): STG = LTGs due to remaining LOS  Skilled Therapeutic Interventions/Progress Updates:    Treatment session with focus on functional transfers and sequencing/organization.  Pt received upright in w/c, reporting she had done something bad this AM - referencing her fall.  Discussed need for continued assistance with mobility and self-care tasks and to call for assistance with pt reporting understanding but that she wants to do this "by myself".  Pt propelled w/c 12' with cues for sequencing and obstacle negotiation.  Blocked practice with transfers with slide board, placed 4" step under feet to increase ability to push through legs during transfers.  Ambulated to complete slide board transfers with mod-max A and improved weight shift with support through BLE.  Engaged in peg activity in unsupported sitting to challenge balance while focusing on sequencing/organization of task.  Pt with increased difficulty with diagonal pattern requiring max cues and redirection to replicate pattern.  Returned to room and back to bed at end of session in preparation for BLE dopplers.  Therapy Documentation Precautions:  Precautions Precautions: Fall Precaution Comments: monitor blood pressure Restrictions Weight Bearing Restrictions: No Pain: Pain Assessment Pain Assessment: No/denies pain  See Function Navigator for Current Functional Status.   Therapy/Group: Individual Therapy  [name] 11/29/2017, 12:14 PM

## 2017-11-29 NOTE — Progress Notes (Signed)
Subjective/Complaints: Patient seen lying in bed this a.m.  Informed by nursing patient had a fall early this morning.  Patient also states that he fell out of bed trying to get her pants on this morning.  Reminded of safety.  Patient fell on her bottom without head trauma.  She asks that I prefer her recovery.  ROS: Denies CP, SOB, nausea, vomiting, diarrhea.  Objective: Vital Signs: Blood pressure (!) 155/93, pulse 90, temperature 98.1 F (36.7 C), temperature source Oral, resp. rate 18, height 4' 11"  (1.499 m), weight 47.2 kg (104 lb), SpO2 95 %. No results found. Results for orders placed or performed during the hospital encounter of 11/05/17 (from the past 72 hour(s))  Glucose, capillary     Status: Abnormal   Collection Time: 11/26/17 11:53 AM  Result Value Ref Range   Glucose-Capillary 169 (H) 65 - 99 mg/dL  Glucose, capillary     Status: Abnormal   Collection Time: 11/26/17  5:04 PM  Result Value Ref Range   Glucose-Capillary 164 (H) 65 - 99 mg/dL  Glucose, capillary     Status: Abnormal   Collection Time: 11/26/17  9:28 PM  Result Value Ref Range   Glucose-Capillary 165 (H) 65 - 99 mg/dL   Comment 1 Notify RN   Glucose, capillary     Status: None   Collection Time: 11/27/17  6:52 AM  Result Value Ref Range   Glucose-Capillary 89 65 - 99 mg/dL   Comment 1 Notify RN   Renal function panel     Status: Abnormal   Collection Time: 11/27/17  7:03 AM  Result Value Ref Range   Sodium 143 135 - 145 mmol/L   Potassium 4.0 3.5 - 5.1 mmol/L   Chloride 110 101 - 111 mmol/L   CO2 22 22 - 32 mmol/L   Glucose, Bld 104 (H) 65 - 99 mg/dL   BUN 87 (H) 6 - 20 mg/dL   Creatinine, Ser 2.93 (H) 0.44 - 1.00 mg/dL   Calcium 8.6 (L) 8.9 - 10.3 mg/dL   Phosphorus 4.9 (H) 2.5 - 4.6 mg/dL   Albumin 2.3 (L) 3.5 - 5.0 g/dL   GFR calc non Af Amer 17 (L) >60 mL/min   GFR calc Af Amer 20 (L) >60 mL/min    Comment: (NOTE) The eGFR has been calculated using the CKD EPI equation. This calculation  has not been validated in all clinical situations. eGFR's persistently <60 mL/min signify possible Chronic Kidney Disease.    Anion gap 11 5 - 15    Comment: Performed at [facility] [address] [place], [place] [address]  Glucose, capillary     Status: Abnormal   Collection Time: 11/27/17 11:58 AM  Result Value Ref Range   Glucose-Capillary 111 (H) 65 - 99 mg/dL  Glucose, capillary     Status: Abnormal   Collection Time: 11/27/17  4:20 PM  Result Value Ref Range   Glucose-Capillary 145 (H) 65 - 99 mg/dL  Glucose, capillary     Status: Abnormal   Collection Time: 11/27/17  9:04 PM  Result Value Ref Range   Glucose-Capillary 132 (H) 65 - 99 mg/dL   Comment 1 Notify RN   Renal function panel     Status: Abnormal   Collection Time: 11/28/17  6:02 AM  Result Value Ref Range   Sodium 140 135 - 145 mmol/L   Potassium 3.7 3.5 - 5.1 mmol/L   Chloride 108 101 - 111 mmol/L   CO2 22 22 -  32 mmol/L   Glucose, Bld 99 65 - 99 mg/dL   BUN 89 (H) 6 - 20 mg/dL   Creatinine, Ser 2.99 (H) 0.44 - 1.00 mg/dL   Calcium 8.4 (L) 8.9 - 10.3 mg/dL   Phosphorus 4.6 2.5 - 4.6 mg/dL   Albumin 2.3 (L) 3.5 - 5.0 g/dL   GFR calc non Af Amer 16 (L) >60 mL/min   GFR calc Af Amer 19 (L) >60 mL/min    Comment: (NOTE) The eGFR has been calculated using the CKD EPI equation. This calculation has not been validated in all clinical situations. eGFR's persistently <60 mL/min signify possible Chronic Kidney Disease.    Anion gap 10 5 - 15    Comment: Performed at [facility] [address], [place], [place] [address]  Glucose, capillary     Status: None   Collection Time: 11/28/17  6:50 AM  Result Value Ref Range   Glucose-Capillary 84 65 - 99 mg/dL   Comment 1 Notify RN   Glucose, capillary     Status: Abnormal   Collection Time: 11/28/17 11:33 AM  Result Value Ref Range   Glucose-Capillary 137 (H) 65 - 99 mg/dL  Glucose, capillary     Status: Abnormal   Collection Time: 11/28/17  4:48  PM  Result Value Ref Range   Glucose-Capillary 155 (H) 65 - 99 mg/dL  Glucose, capillary     Status: Abnormal   Collection Time: 11/28/17  9:40 PM  Result Value Ref Range   Glucose-Capillary 131 (H) 65 - 99 mg/dL  Renal function panel     Status: Abnormal   Collection Time: 11/29/17  6:28 AM  Result Value Ref Range   Sodium 142 135 - 145 mmol/L   Potassium 3.7 3.5 - 5.1 mmol/L   Chloride 108 101 - 111 mmol/L   CO2 22 22 - 32 mmol/L   Glucose, Bld 97 65 - 99 mg/dL   BUN 87 (H) 6 - 20 mg/dL   Creatinine, Ser 2.98 (H) 0.44 - 1.00 mg/dL   Calcium 8.6 (L) 8.9 - 10.3 mg/dL   Phosphorus 4.6 2.5 - 4.6 mg/dL   Albumin 2.4 (L) 3.5 - 5.0 g/dL   GFR calc non Af Amer 17 (L) >60 mL/min   GFR calc Af Amer 19 (L) >60 mL/min    Comment: (NOTE) The eGFR has been calculated using the CKD EPI equation. This calculation has not been validated in all clinical situations. eGFR's persistently <60 mL/min signify possible Chronic Kidney Disease.    Anion gap 12 5 - 15    Comment: Performed at [facility] [address] [place], [place] [address]  Glucose, capillary     Status: None   Collection Time: 11/29/17  6:29 AM  Result Value Ref Range   Glucose-Capillary 85 65 - 99 mg/dL    General: NAD. Vital signs reviewed. HEENT: Normocephalic, atraumatic.  Cardio: RRR. No JVD. Resp: CTA B/L and unlabored. GI: BS positive and ND Musc/Skel:  No tenderness. Lower extremity edema, persistent. Neuro: Alert and oriented 3 with increased time (stable) Motor:  LUE: 5/5 proximal to distal RUE: 4+/5 proximal to distal  RLE: HF: 2+/5, ADF/PF 2/5 (unchanged) LLE: HF 3-/5, ADF/PF 2+/5 (unchanged) Skin: Intact. Warm and dry.  Psych: Flat, cooperative.   Assessment/Plan: 1. Functional deficits secondary to debility and encephalopathy which require 3+ hours per day of interdisciplinary therapy in a comprehensive inpatient rehab setting. Physiatrist is providing close team supervision and 24 hour  management of active medical problems listed below. Physiatrist and rehab team continue to assess barriers to discharge/monitor patient progress toward functional and medical goals. FIM: Function - Bathing Position: Shower Body parts bathed by patient: Right arm, Left arm, Chest, Abdomen, Right upper leg, Left upper leg Body parts bathed by helper: Front perineal area, Buttocks, Right lower leg, Left lower leg, Back Bathing not applicable: Left upper leg, Left lower leg, Right lower leg, Right upper leg Assist Level: (Mod assist)  Function- Upper Body Dressing/Undressing What is the patient wearing?: Pull over shirt/dress Bra - Perfomed by patient: Thread/unthread left bra strap Bra - Perfomed by helper: Thread/unthread right bra strap, Hook/unhook bra (pull down sports bra), Thread/unthread left bra strap Pull over shirt/dress - Perfomed by patient: Thread/unthread right sleeve, Thread/unthread left sleeve, Put head through opening, Pull shirt over trunk Pull over shirt/dress - Perfomed by helper: Pull shirt over trunk Assist Level: Supervision or verbal cues, Set up Set up : To obtain clothing/put away Function - Lower Body Dressing/Undressing What is the patient wearing?: Pants, Non-skid slipper socks Position: Bed Underwear - Performed by helper: Thread/unthread right underwear leg, Thread/unthread left underwear leg Pants- Performed by patient: Pull pants up/down Pants- Performed by helper: Thread/unthread left pants leg, Thread/unthread right pants leg, Pull pants up/down Non-skid slipper socks- Performed by helper: Don/doff right sock, Don/doff left sock Assist for footwear: Maximal assist Assist for lower body dressing: More than reasonable time, Touching or steadying assistance (Pt > 75%)(Total assist) Set up : To obtain clothing/put away  Function - Toileting Toileting activity did not occur: No continent bowel/bladder event Toileting steps completed by helper: Adjust  clothing prior to toileting, Performs perineal hygiene, Adjust clothing after toileting Assist level: Touching or steadying assistance (Pt.75%)(per Ivy Lyndee Leo Aquit, NT report)  Function - Air cabin crew transfer activity did not occur: Safety/medical concerns Toilet transfer assistive device: Drop arm commode, Sliding board, Bedside commode Assist level to toilet: 2 helpers(per [place], NT report) Assist level to bedside commode (at bedside): Total assist (Pt < 25%) Assist level from bedside commode (at bedside): Total assist (Pt < 25%)  Function - Chair/bed transfer Chair/bed transfer method: Lateral scoot Chair/bed transfer assist level: Moderate assist (Pt 50 - 74%/lift or lower) Chair/bed transfer assistive device: Sliding board Chair/bed transfer details: Manual facilitation for placement, Manual facilitation for weight shifting, Verbal cues for technique  Function - Locomotion: Wheelchair Will patient use wheelchair at discharge?: Yes Type: Manual Max wheelchair distance: 25 Assist Level: Touching or steadying assistance (Pt > 75%) Assist Level: Touching or steadying assistance (Pt > 75%) Wheel 150 feet activity did not occur: Safety/medical concerns Assist Level: Touching or steadying assistance (Pt > 75%) Turns around,maneuvers to table,bed, and toilet,negotiates 3% grade,maneuvers on rugs and over doorsills: No Function - Locomotion: Ambulation Ambulation activity did not occur: Safety/medical concerns Walk 10 feet activity did not occur: Safety/medical concerns Walk 50 feet with 2 turns activity did not occur: Safety/medical concerns Walk 150 feet activity did not occur: Safety/medical concerns Walk 10 feet on uneven surfaces activity did not occur: Safety/medical concerns  Function - Comprehension Comprehension: Auditory Comprehension assist level: Understands basic 75 - 89% of the time/ requires cueing 10 - 24% of the time  Function -  Expression Expression: Verbal Expression assist level: Expresses basic needs/ideas: With extra time/assistive device  Function - Social Interaction Social Interaction assist level: Interacts appropriately 75 - 89% of the time - Needs redirection for appropriate language or to initiate interaction.  Function -  Problem Solving Problem solving assist level: Solves basic 75 - 89% of the time/requires cueing 10 - 24% of the time  Function - Memory Memory assist level: Recognizes or recalls 50 - 74% of the time/requires cueing 25 - 49% of the time Patient normally able to recall (first 3 days only): Current season, That he or she is in a hospital  Medical Problem List and Plan: 1.Functional and mobility deficitssecondary to debility and encephalopathy from multiple medical issues   Cont CIR   Repeat MRI brain and T-spine suggesting stable/improvement. Neurology signed off.   Head CT reviewed, no acute intracranial abnormalities. 2. DVT Prophylaxis/Anticoagulation: Pharmaceutical:Lovenox   Right peroneal DVT, patient too high risk for renal complications with CTA. VQ scan likely inconclusive given edema. Discussed with Pulm, will monitor for now 3. Pain Management:tylenol prn 4. Mood:LCSW to follow for evaluation and support.   team to continue to provide support    xanax prn for anxiety 5. Neuropsych: This patientis not fullycapable of making decisions onherown behalf. 6. Skin/Wound Care:Air mattress for pressure relief measures. Local measures to help managesacral /labialMASD PRAFO's for bilateral LE's 7. Fluids/Electrolytes/Nutrition:Monitor I/O.    Continue to offer supplements to help with protein calorie malnutrition.   Cont Remeron 15 qhs 8. C diff colitis: Resolved   Vancomycincompleted 2/6 Continue enteric precautions  Repeat C diff PCR + but toxin negative.     Appreciate ID recs, no plans to treat at present 9. HTN: Monitor BP  bid.    Nifedipine bid increased to 60 on 2/6   Imdur increased to 90 on 2/4   Metoprolol increased to 100 BID on 2/3   Hydralazine 10 3 times a day started on 2/13, increased to 25 on 2/14   Elevated on 2/25 Vitals:   11/29/17 0504 11/29/17 0700  BP: (!) 159/84 (!) 155/93  Pulse: 89 90  Resp: 18 18  Temp: 98.1 F (36.7 C)   SpO2: 96% 95%  10 T2DM with retinopathy, neuropathy, andnephropathy:Monitor BS ac/hs.    SSI for elevated BS CBG (last 3)  Recent Labs    11/28/17 1648 11/28/17 2140 11/29/17 0629  GLUCAP 155* 131* 85    Labile on 2/25 11. H/o depression: Continue Effexor and Remeron.  12. Neurogenic bladder:Has chronic indwelling catheter for 2+ months.   Foley out, I/O caths, keep strict I/Os.  13.AKI onCKD with history of overload:    Cr 2.98 on 2/25, IVF started by nephrology, d/ced due to fluid overload, no real changes per Nephro   Daily Labs    Encourage fluids   Appreciate nephrology recs   Cont to monitor 14. Diffuse CAD: treated medically with Metoprolol, Zetia and Plavix. 15.  ABLA on anemia of chronic disease:    Stool OB now neg   Hb 11.5 on 2/22   Appreciate GI recs, EGD/colonoscopy performed on 2/10 showing a total of 5 duodenal ulcers, which are presumed to be a source of bleeding.  16. Leukocytosis:?  Resolved   WBCs 10.5 on 2/22   Afebrile   Repeat UA relatively unremarkable for infection, Ucx multi species  17. Hypoalbuminemia   Cont supplement 18. Hyperkalemia: Resolved   Cont to monitor    K+ 40 daily x2 days due to lasix, changed to Bumex on 2/22 19. Acute lower UTI   UA is equivocal, urine culture with Pseudomonas   Cipro completed 2/11-2/13 20.  GI bleed   Believed to be secondary to duodenal ulcers   Appreciate GI recs, 40 mg  Protonix twice daily    H. pylori serology negative   Polyp biopsy on colonoscopy results to be sent patient for GI 21. Dyspnea   Continue Lasix  CXR reviewed, suggesting fluid overload 22. Syncope:    Appreciate teaching service recs, likely vasovagal   Additional IV dose lasix given on 2/20   ECG/CE reviewed, no significant acute concerns.  Cardiac enzymes negative, ECG  Repeat CXR reviewed, showing opacities. Appreciate pulmonary recs suggesting likely fluid overload and less likely pneumonia.   23. Pulmonary edema   Continue Lasix   Chest x-ray reviewed, showing persistent edema 24.  Fall   Reminded patient of safety   Will order to Tele-sitter   No apparent head trauma and fall from low bed on bottom    LOS (Days) 24 A FACE TO FACE EVALUATION WAS PERFORMED  Addley Ballinger Lorie Phenix 11/29/2017, 10:51 AM

## 2017-11-29 NOTE — Progress Notes (Signed)
LE venous duplex prelim: negative for DVT. Kyleen Villatoro Eunice, RDMS, RVT  

## 2017-11-29 NOTE — Plan of Care (Signed)
Pt continues to get out of bed without assistance which caused her to fall on this am.  Tele sitter added to decrease risk of another fall.

## 2017-11-29 NOTE — Progress Notes (Signed)
Physical Therapy Note  Patient Details  Name: Laura [name] MRN: [id number] Date of Birth: 01/06/1961 Today's Date: 11/29/2017    Time: [phone] 55 minutes  1:1 No c/o pain.  Pt propels w/c with supervision, mod/max cuing for technique and attention to obstacles 2 x 50'.  Standing frame with focus on LE and core strength and activation.  Pt able to perform wt shifts laterally and A/P with tactile cues, manual facilitation and many repetitions. Pt with improving activation of LE and core mm. Pt able to tolerate standing 3 x 7 minutes.  Seated balance task with reaching forward and laterally with ball pass task. Pt supervision with seated balance, fatigues easily with this task requiring frequent rests.   DONAWERTH,KAREN 11/29/2017, 10:24 AM

## 2017-11-29 NOTE — Progress Notes (Signed)
Speech Language Pathology Daily Session Note  Patient Details  Name: Laura [name] MRN: [id number] Date of Birth: Jun 11, 1961  Today's Date: 11/29/2017 SLP Individual Time: 0810-0900 SLP Individual Time Calculation (min): 50 min  Short Term Goals: Week 4: SLP Short Term Goal 1 (Week 4): Pt will utilize external memory aides to recall new dialy information with Min A cues.  SLP Short Term Goal 2 (Week 4): Pt will selectively attend to basic familiar task in a mildly distracting environment  for ~ 20 minutes with Min A cues.  SLP Short Term Goal 3 (Week 4): Pt will complete novel, basic familiar problem solving task with Min A cues.  SLP Short Term Goal 4 (Week 4): Pt will increase vocal intensity to achieve intelligibility in mildly noisy environments with min verbal cues.    Skilled Therapeutic Interventions:Skilled ST services focused on swallow and cognitive skills. Nurse tech reported fall from bed this morning, pt had pressed call bell, however did not wait for help to arrive and attempted to reach for pants. Pt recalled fall events and stated that she should of waited for help to arrive. SLP created visual aid in room to remind pt to press call bell and then ait for assistance, pt demonstrated ability to read sign. SLP facilitated orientation pt required mod A verbal cues to utilizing visual aid in room. SLP facilitated PO consumption of breakfast tray focusing on functional problem solving skills. Pt demonstrated ability to feed self, however required Min A verbal to request assistance for cutting solids. Pt demonstrated recall and understanding of discharge plan with mod A verbal cues. Pt was ability to identify current deficits with Mod A verbal cues. SLP facilitated recall of novel information in spaced retrevial task, pt required initially max A verbal repetition cuse for immediate recall of 5 words,  then given up to three minute delay recalled 100% of words in mildly distracting  environment with use of repetition and visualization strategies. Pt recalled safety directions to press call bell and wait without visual aid, however concerned with safety awareness when left alone. Pt was left in room with call bell within reach and safety belt on. Recommend to continue skilled ST services.       Function:  Eating Eating   Modified Consistency Diet: No Eating Assist Level: More than reasonable amount of time;Supervision or verbal cues   Eating Set Up Assist For: Opening containers       Cognition Comprehension Comprehension assist level: Understands basic 75 - 89% of the time/ requires cueing 10 - 24% of the time  Expression   Expression assist level: Expresses basic needs/ideas: With extra time/assistive device  Social Interaction Social Interaction assist level: Interacts appropriately 75 - 89% of the time - Needs redirection for appropriate language or to initiate interaction.  Problem Solving Problem solving assist level: Solves basic 50 - 74% of the time/requires cueing 25 - 49% of the time  Memory Memory assist level: Recognizes or recalls 75 - 89% of the time/requires cueing 10 - 24% of the time    Pain Pain Assessment Pain Assessment: No/denies pain  Therapy/Group: Individual Therapy  Kainan Patty  [facility] 11/29/2017, 12:46 PM

## 2017-11-29 NOTE — Significant Event (Signed)
Staff heard patient call out and responded to room.  Patient observed sitting upright on the floor, leaning back on the bed  between the two siderails closest to the window.  Patient yelling "Hurry, help!" repeatedly.  Staff called for help.  Nurse assessed patient, no obvious signs of injury noted.  Patient was assisted to the bed with 2 helpers.  Vital signs obtained. Toileting provided.  Patient complains of right knee pain.  No new swelling or bruising at the site.  Dr Posey Pronto notified at [place].  Husband, Legrand Como, notified at [phone].

## 2017-11-30 ENCOUNTER — Inpatient Hospital Stay (HOSPITAL_COMMUNITY): Payer: No Typology Code available for payment source | Admitting: Physical Therapy

## 2017-11-30 ENCOUNTER — Inpatient Hospital Stay (HOSPITAL_COMMUNITY): Payer: No Typology Code available for payment source

## 2017-11-30 ENCOUNTER — Inpatient Hospital Stay (HOSPITAL_COMMUNITY): Payer: No Typology Code available for payment source | Admitting: Occupational Therapy

## 2017-11-30 LAB — GLUCOSE, CAPILLARY
GLUCOSE-CAPILLARY: 214 mg/dL — AB (ref 65–99)
Glucose-Capillary: 114 mg/dL — ABNORMAL HIGH (ref 65–99)
Glucose-Capillary: 135 mg/dL — ABNORMAL HIGH (ref 65–99)
Glucose-Capillary: 189 mg/dL — ABNORMAL HIGH (ref 65–99)

## 2017-11-30 LAB — RENAL FUNCTION PANEL
ALBUMIN: 2.2 g/dL — AB (ref 3.5–5.0)
Anion gap: 12 (ref 5–15)
BUN: 87 mg/dL — AB (ref 6–20)
CALCIUM: 8.6 mg/dL — AB (ref 8.9–10.3)
CO2: 21 mmol/L — ABNORMAL LOW (ref 22–32)
CREATININE: 2.9 mg/dL — AB (ref 0.44–1.00)
Chloride: 109 mmol/L (ref 101–111)
GFR calc Af Amer: 20 mL/min — ABNORMAL LOW (ref >60)
GFR calc non Af Amer: 17 mL/min — ABNORMAL LOW (ref >60)
GLUCOSE: 104 mg/dL — AB (ref 65–99)
Phosphorus: 4.9 mg/dL — ABNORMAL HIGH (ref 2.5–4.6)
Potassium: 3.9 mmol/L (ref 3.5–5.1)
SODIUM: 142 mmol/L (ref 135–145)

## 2017-11-30 NOTE — NC FL2 (Signed)
[place] LEVEL OF CARE SCREENING TOOL     IDENTIFICATION  Patient Name: Underwood Birthdate: Nov 19, 1960 Sex: female Admission Date (Current Location): 11/05/2017  [facility] and [place] Number:  Herbalist and Address:  [facility] [place], [place] [address] [place], [place] [address]      Provider Number: [id number]  Attending Physician Name and Address:  [name], MD  Relative Name and Phone Number:       Current Level of Care: Other (Comment)(Acute Inpatient Rehab) Recommended Level of Care: [place] Prior Approval Number:    Date Approved/Denied:   PASRR Number: [id number]  Discharge Plan: SNF    Current Diagnoses: Patient Active Problem List   Diagnosis Date Noted  . Fall   . Acute pulmonary edema (HCC)   . Head trauma   . Deep venous thrombosis ([place])   . Abnormal chest x-ray   . Vasovagal syncope   . Syncope and collapse   . Diarrhea   . SOB (shortness of breath)   . Hypertensive crisis   . Duodenal ulcer   . Acute lower UTI   . Gastrointestinal hemorrhage associated with duodenal ulcer   . Uncontrolled hypertension   . Anticoagulated   . Benign neoplasm of transverse colon   . Abnormal urinalysis   . Nephrotic syndrome   . Rectal bleeding   . Abnormal MRI   . Uncontrolled type 2 diabetes mellitus with hyperglycemia ([place])   . Diabetic peripheral neuropathy ([place])   . Weakness of both lower extremities   . Hyperkalemia   . Hypoglycemia   . Type 2 diabetes mellitus with complication, with long-term current use of insulin ([place])   . Neurogenic bladder   . Hypoalbuminemia due to protein-calorie malnutrition ([place])   . AKI (acute kidney injury) ([place])   . Stage 3 chronic kidney disease ([place])   . Physical debility 11/05/2017  . Critical illness myopathy   . Encephalopathy   . Diabetes mellitus type 2 in nonobese (HCC)   . Hyperlipidemia   . Cognitive impairment   . Benign essential HTN    . Chronic diastolic congestive heart failure ([place])   . Chronic kidney disease (CKD), stage IV (severe) ([place])   . Acute blood loss anemia   . Anemia   . Leukocytosis   . C. difficile colitis   . Urinary tract infection 10/30/2017  . Pressure injury of skin 10/30/2017    Orientation RESPIRATION BLADDER Height & Weight     Self, Situation, Place  Normal Incontinent, External catheter(I/O caths q 8 hrs) Weight: 47.2 kg (104 lb) Height:  4\' 11"  (149.9 cm)  BEHAVIORAL SYMPTOMS/MOOD NEUROLOGICAL BOWEL NUTRITION STATUS      Incontinent Diet(renal/ carb modified diet with 1231ml daily fluid restriction)  AMBULATORY STATUS COMMUNICATION OF NEEDS Skin   Extensive Assist Verbally (unstageable to sacrum/ BID dressing santyl/moistened to dry guaze and tegderm )   PU Stage 2 Dressing: BID                   Personal Care Assistance Level of Assistance  Bathing, Feeding, Dressing, Total care Bathing Assistance: Maximum assistance Feeding assistance: Limited assistance Dressing Assistance: Maximum assistance Total Care Assistance: Maximum assistance   Functional Limitations Info    Sight Info: Adequate Hearing Info: Adequate Speech Info: Adequate    SPECIAL CARE FACTORS FREQUENCY  PT (By licensed PT), OT (By licensed OT), Bowel and bladder program, Speech therapy     PT  Frequency: 5x/wk OT Frequency: 5x/wk Bowel and Bladder Program Frequency: qd   Speech Therapy Frequency: 5x/wk      Contractures Contractures Info: Not present    Additional Factors Info  Code Status, Allergies, Isolation Precautions Code Status Info: Full Allergies Info: Aspirin, gabapentin, statins     Isolation Precautions Info: enteric -  most recent repeat C diff PCR + but toxin negative     Current Medications (11/30/2017):  This is the current hospital active medication list Current Facility-Administered Medications  Medication Dose Route Frequency Provider Last Rate Last Dose  . acetaminophen  (TYLENOL) tablet 325-650 mg  325-650 mg Oral Q4H PRN Bary Leriche, PA-C   650 mg at 11/16/17 0933  . ALPRAZolam Duanne Moron) tablet 0.25 mg  0.25 mg Oral TID PRN Meredith Staggers, MD   0.25 mg at 11/26/17 3664  . alum & mag hydroxide-simeth (MAALOX/MYLANTA) 200-200-20 MG/5ML suspension 30 mL  30 mL Oral Q4H PRN Love, Pamela S, PA-C      . bisacodyl (DULCOLAX) suppository 10 mg  10 mg Rectal Daily PRN Love, Pamela S, PA-C      . bumetanide (BUMEX) tablet 2 mg  2 mg Oral BID Pearson Grippe B, MD   2 mg at 11/30/17 0817  . collagenase (SANTYL) ointment   Topical BID Love, Pamela S, PA-C      . diphenhydrAMINE (BENADRYL) 12.5 MG/5ML elixir 12.5-25 mg  12.5-25 mg Oral Q6H PRN Love, Pamela S, PA-C      . enoxaparin (LOVENOX) injection 30 mg  30 mg Subcutaneous Q24H Love, Pamela S, PA-C   30 mg at 11/29/17 2137  . ezetimibe (ZETIA) tablet 10 mg  10 mg Oral Daily Bary Leriche, PA-C   10 mg at 11/30/17 4034  . feeding supplement (NEPRO CARB STEADY) liquid 237 mL  237 mL Oral TID WC LoveIvan Anchors, PA-C   237 mL at 11/29/17 1847  . feeding supplement (PRO-STAT SUGAR FREE 64) liquid 30 mL  30 mL Oral BID Bary Leriche, PA-C   30 mL at 11/30/17 0817  . guaiFENesin-dextromethorphan (ROBITUSSIN DM) 100-10 MG/5ML syrup 5-10 mL  5-10 mL Oral Q6H PRN Love, Pamela S, PA-C      . hydrALAZINE (APRESOLINE) tablet 25 mg  25 mg Oral Q8H Patel, Domenick Bookbinder, MD   25 mg at 11/30/17 0500  . insulin aspart (novoLOG) injection 0-9 Units  0-9 Units Subcutaneous TID WC LoveIvan Anchors, PA-C   1 Units at 11/29/17 1328  . isosorbide mononitrate (IMDUR) 24 hr tablet 90 mg  90 mg Oral Daily [name], MD   90 mg at 11/30/17 0816  . lip balm (BLISTEX) ointment   Topical PRN [name], MD      . methocarbamol (ROBAXIN) tablet 500 mg  500 mg Oral QID PRN Bary Leriche, PA-C   500 mg at 11/11/17 1926  . metoprolol tartrate (LOPRESSOR) tablet 100 mg  100 mg Oral BID Charlett Blake, MD   100 mg at 11/30/17 0817  .  mirtazapine (REMERON) tablet 15 mg  15 mg Oral QHS Love, Pamela S, PA-C   15 mg at 11/29/17 2137  . multivitamin (RENA-VIT) tablet 1 tablet  1 tablet Oral QHS Bary Leriche, PA-C   1 tablet at 11/29/17 2137  . NIFEdipine (PROCARDIA-XL/ADALAT CC) 24 hr tablet 60 mg  60 mg Oral BID [name], MD   60 mg at 11/30/17 0817  . pantoprazole (PROTONIX) EC tablet 40  mg  40 mg Oral BID AC Gatha Mayer, MD   40 mg at 11/30/17 0500  . polycarbophil (FIBERCON) tablet 1,250 mg  1,250 mg Oral BID AC LoveIvan Anchors, PA-C   1,250 mg at 11/29/17 1839  . polyethylene glycol (MIRALAX / GLYCOLAX) packet 17 g  17 g Oral Daily PRN Love, Pamela S, PA-C      . prochlorperazine (COMPAZINE) tablet 5-10 mg  5-10 mg Oral Q6H PRN Bary Leriche, PA-C   5 mg at 11/15/17 1006   Or  . prochlorperazine (COMPAZINE) injection 5-10 mg  5-10 mg Intramuscular Q6H PRN Love, Pamela S, PA-C       Or  . prochlorperazine (COMPAZINE) suppository 12.5 mg  12.5 mg Rectal Q6H PRN Love, Pamela S, PA-C      . traZODone (DESYREL) tablet 25-50 mg  25-50 mg Oral QHS PRN Bary Leriche, PA-C   50 mg at 11/10/17 2343  . venlafaxine XR (EFFEXOR-XR) 24 hr capsule 150 mg  150 mg Oral Q breakfast Bary Leriche, PA-C   150 mg at 11/30/17 7711     Discharge Medications: Please see discharge summary for a list of discharge medications.  Relevant Imaging Results:  Relevant Lab Results:   Additional Information SS# [id number];  using telesitter for supervision as pt had a fall two days ago from bed (her only one) - no physical restraints in place.  Traevion Poehler, LCSW

## 2017-11-30 NOTE — Progress Notes (Signed)
Occupational Therapy Session Note  Patient Details  Name: Laura [name] MRN: [id number] Date of Birth: 1961/08/29  Today's Date: 11/30/2017 OT Individual Time: 1430-1525 OT Individual Time Calculation (min): 55 min    Short Term Goals: Week 3:  OT Short Term Goal 1 (Week 3): STG = LTGs due to remaining LOS  Skilled Therapeutic Interventions/Progress Updates:    Treatment session with focus on BUE strength and endurance as needed for transfers and functional mobility. Pt received uprights in w/c.  Pt notified therapist of current plan to d/c to SNF for additional rehab prior to home, with pt reporting sad but understanding of rationale behind decision.  Engaged in w/c mobility with min assist as tendency to veer to Rt.  Engaged in [place] on UE arm bike for total of 10 mins, alternating 2 mins forward and 2 mins backward - requiring rest break after each 2 min set.  Engaged in ball toss activity with pt utilizing 1# dowel rod progressing to 2# due to increased strength and activity tolerance.  Utilized dowel rod to tap ball back to therapist.  Pt demonstrating increased trunk control with weight shifts during ball activity.  Returned to room and left upright in w/c with quick release belt intact and all needs in reach.  Therapy Documentation Precautions:  Precautions Precautions: Fall Precaution Comments: monitor blood pressure Restrictions Weight Bearing Restrictions: No General:   Vital Signs: Therapy Vitals Temp: (!) 97.5 F (36.4 C) Temp Source: Axillary Pulse Rate: 80 Resp: 18 BP: 126/64 Patient Position (if appropriate): Sitting Oxygen Therapy SpO2: 96 % O2 Device: Room Air Pain:  Pt with no c/o pain  See Function Navigator for Current Functional Status.   Therapy/Group: Individual Therapy  [name] 11/30/2017, 3:50 PM

## 2017-11-30 NOTE — Progress Notes (Signed)
Nutrition Follow-up  DOCUMENTATION CODES:   Not applicable  INTERVENTION:  Continue Nepro Shake po TID, each supplement provides 425 kcal and 19 grams protein.  Continue 30 ml Prostat po BID, each supplement provides 100 kcal and 15 grams of protein.   Encourage adequate PO intake.   NUTRITION DIAGNOSIS:   Increased nutrient needs related to wound healing as evidenced by estimated needs; ongoing  GOAL:   Patient will meet greater than or equal to 90% of their needs; met   MONITOR:   PO intake, Supplement acceptance, Labs, Weight trends, I & O's, Skin  REASON FOR ASSESSMENT:   Malnutrition Screening Tool    ASSESSMENT:   57 year old female(from [place]) with T2DM with neuropathy and retinopathy, medical HTN, medical non-compliance, CKD stage IV, admission to [facility] hospital 08/21/17 for nephrotic syndrome with fluid overload and encephalopathy She was admitted to [facility] 10/30/17 with weakness and lethargy, loose stools and leucocytosis.  Meal completion has been 30-50%. Pt has nutritional supplements ordered to aid in caloric and protein needs (Nepro and prostat) and has been consuming them. Nutritional needs are met via supplements. RD to continue with current orders. Pt encouraged to eat her food at meals and to drink her supplements.    Diet Order:  Diet renal/carb modified with fluid restriction Diet-HS Snack? Nothing; Fluid restriction: 1200 mL Fluid; Room service appropriate? Yes; Fluid consistency: Thin  EDUCATION NEEDS:   Education needs have been addressed  Skin:  Skin Assessment: Skin Integrity Issues: Skin Integrity Issues:: Unstageable Unstageable: sacrum  Last BM:  2/26  Height:   Ht Readings from Last 1 Encounters:  11/05/17 4' 11"  (1.499 m)    Weight:   Wt Readings from Last 1 Encounters:  11/30/17 108 lb (49 kg)  Rehab admission 11/05/17: 121 lb (54.9 kg) Net I/O since admission: -12 L Weight loss likely related to fluid status.  Ideal Body  Weight:  44.5 kg  BMI:  Body mass index is 21.81 kg/m.  Estimated Nutritional Needs:   Kcal:  1600-1800  Protein:  70-85 grams  Fluid:  Per MD    Corrin Parker, MS, RD, LDN Pager # [phone] After hours/ weekend pager # [phone]

## 2017-11-30 NOTE — Progress Notes (Signed)
Physical Therapy Note  Patient Details  Name: Laura [name] MRN: [id number] Date of Birth: [birth year] Today's Date: 11/30/2017    Time: 830-925 55 minutes  1:1 No c/o pain.  Pt performs rolling in bed with min A with total A to don pants.  Pt supine to sit with min A.  Sliding board transfers to w/c with mod/max A , max cuing and facilitation for forward lean and wt shifting to transfer.  Supine therex on mat with focus on core, hip and pelvic strengthening.  Bridging, LTR, with LEs on ball hip/knee flex/ext, adductor squeezing all with manual facilitation and assist.  Rolling supine to prone with mod A.  Prone on elbows x 2 minutes, pt gets into quadruped with max A, able to maintain quadruped with mod A x 2 minutes.  Pt performs w/c mobility with supervision/max cuing x 100'.   Jarryn Altland 11/30/2017, 9:26 AM

## 2017-11-30 NOTE — Progress Notes (Signed)
Subjective/Complaints: Pt seen lying in bed this AM.  She slept well overnight.  She again asks me to pray for her.    ROS: Denies CP, SOB, nausea, vomiting, diarrhea.  Objective: Vital Signs: Blood pressure (!) 193/96, pulse 88, temperature 98.3 F (36.8 C), temperature source Oral, resp. rate 18, height 4' 11"  (1.499 m), weight 47.2 kg (104 lb), SpO2 94 %. No results found. Results for orders placed or performed during the hospital encounter of 11/05/17 (from the past 72 hour(s))  Glucose, capillary     Status: Abnormal   Collection Time: 11/27/17 11:58 AM  Result Value Ref Range   Glucose-Capillary 111 (H) 65 - 99 mg/dL  Glucose, capillary     Status: Abnormal   Collection Time: 11/27/17  4:20 PM  Result Value Ref Range   Glucose-Capillary 145 (H) 65 - 99 mg/dL  Glucose, capillary     Status: Abnormal   Collection Time: 11/27/17  9:04 PM  Result Value Ref Range   Glucose-Capillary 132 (H) 65 - 99 mg/dL   Comment 1 Notify RN   Renal function panel     Status: Abnormal   Collection Time: 11/28/17  6:02 AM  Result Value Ref Range   Sodium 140 135 - 145 mmol/L   Potassium 3.7 3.5 - 5.1 mmol/L   Chloride 108 101 - 111 mmol/L   CO2 22 22 - 32 mmol/L   Glucose, Bld 99 65 - 99 mg/dL   BUN 89 (H) 6 - 20 mg/dL   Creatinine, Ser 2.99 (H) 0.44 - 1.00 mg/dL   Calcium 8.4 (L) 8.9 - 10.3 mg/dL   Phosphorus 4.6 2.5 - 4.6 mg/dL   Albumin 2.3 (L) 3.5 - 5.0 g/dL   GFR calc non Af Amer 16 (L) >60 mL/min   GFR calc Af Amer 19 (L) >60 mL/min    Comment: (NOTE) The eGFR has been calculated using the CKD EPI equation. This calculation has not been validated in all clinical situations. eGFR's persistently <60 mL/min signify possible Chronic Kidney Disease.    Anion gap 10 5 - 15    Comment: Performed at [facility] [address], [place], [place] [address]  Glucose, capillary     Status: None   Collection Time: 11/28/17  6:50 AM  Result Value Ref Range   Glucose-Capillary 84 65  - 99 mg/dL   Comment 1 Notify RN   Glucose, capillary     Status: Abnormal   Collection Time: 11/28/17 11:33 AM  Result Value Ref Range   Glucose-Capillary 137 (H) 65 - 99 mg/dL  Glucose, capillary     Status: Abnormal   Collection Time: 11/28/17  4:48 PM  Result Value Ref Range   Glucose-Capillary 155 (H) 65 - 99 mg/dL  Glucose, capillary     Status: Abnormal   Collection Time: 11/28/17  9:40 PM  Result Value Ref Range   Glucose-Capillary 131 (H) 65 - 99 mg/dL  Renal function panel     Status: Abnormal   Collection Time: 11/29/17  6:28 AM  Result Value Ref Range   Sodium 142 135 - 145 mmol/L   Potassium 3.7 3.5 - 5.1 mmol/L   Chloride 108 101 - 111 mmol/L   CO2 22 22 - 32 mmol/L   Glucose, Bld 97 65 - 99 mg/dL   BUN 87 (H) 6 - 20 mg/dL   Creatinine, Ser 2.98 (H) 0.44 - 1.00 mg/dL   Calcium 8.6 (L) 8.9 - 10.3 mg/dL   Phosphorus  4.6 2.5 - 4.6 mg/dL   Albumin 2.4 (L) 3.5 - 5.0 g/dL   GFR calc non Af Amer 17 (L) >60 mL/min   GFR calc Af Amer 19 (L) >60 mL/min    Comment: (NOTE) The eGFR has been calculated using the CKD EPI equation. This calculation has not been validated in all clinical situations. eGFR's persistently <60 mL/min signify possible Chronic Kidney Disease.    Anion gap 12 5 - 15    Comment: Performed at [facility] [address] [place], [place] [address]  Glucose, capillary     Status: None   Collection Time: 11/29/17  6:29 AM  Result Value Ref Range   Glucose-Capillary 85 65 - 99 mg/dL  Glucose, capillary     Status: Abnormal   Collection Time: 11/29/17 11:57 AM  Result Value Ref Range   Glucose-Capillary 130 (H) 65 - 99 mg/dL  Glucose, capillary     Status: Abnormal   Collection Time: 11/29/17  1:12 PM  Result Value Ref Range   Glucose-Capillary 134 (H) 65 - 99 mg/dL  Glucose, capillary     Status: Abnormal   Collection Time: 11/29/17  4:48 PM  Result Value Ref Range   Glucose-Capillary 109 (H) 65 - 99 mg/dL  Glucose, capillary     Status:  Abnormal   Collection Time: 11/29/17  8:55 PM  Result Value Ref Range   Glucose-Capillary 202 (H) 65 - 99 mg/dL  Glucose, capillary     Status: Abnormal   Collection Time: 11/30/17  6:35 AM  Result Value Ref Range   Glucose-Capillary 114 (H) 65 - 99 mg/dL    General: NAD. Vital signs reviewed. HEENT: Normocephalic, atraumatic.  Cardio: RRR. No JVD. Resp: CTA B/L and unlabored. GI: BS positive and ND Musc/Skel:  No tenderness. Lower extremity edema, unchanged. Neuro: Alert and oriented 3 with increased time (unchanged) Motor:  LUE: 5/5 proximal to distal RUE: 4+/5 proximal to distal  RLE: HF: 2+/5, ADF/PF 2/5 (slightly improved) LLE: HF 3-/5, ADF/PF 2+/5 (unchanged) Skin: Intact. Warm and dry.  Psych: Flat, cooperative.   Assessment/Plan: 1. Functional deficits secondary to debility and encephalopathy which require 3+ hours per day of interdisciplinary therapy in a comprehensive inpatient rehab setting. Physiatrist is providing close team supervision and 24 hour management of active medical problems listed below. Physiatrist and rehab team continue to assess barriers to discharge/monitor patient progress toward functional and medical goals. FIM: Function - Bathing Position: Shower Body parts bathed by patient: Right arm, Left arm, Chest, Abdomen, Right upper leg, Left upper leg Body parts bathed by helper: Front perineal area, Buttocks, Right lower leg, Left lower leg, Back Bathing not applicable: Left upper leg, Left lower leg, Right lower leg, Right upper leg Assist Level: (Mod assist)  Function- Upper Body Dressing/Undressing What is the patient wearing?: Pull over shirt/dress Bra - Perfomed by patient: Thread/unthread left bra strap Bra - Perfomed by helper: Thread/unthread right bra strap, Hook/unhook bra (pull down sports bra), Thread/unthread left bra strap Pull over shirt/dress - Perfomed by patient: Thread/unthread right sleeve, Thread/unthread left sleeve, Put head  through opening, Pull shirt over trunk Pull over shirt/dress - Perfomed by helper: Pull shirt over trunk Assist Level: Supervision or verbal cues, Set up Set up : To obtain clothing/put away Function - Lower Body Dressing/Undressing What is the patient wearing?: Pants, Non-skid slipper socks Position: Bed Underwear - Performed by helper: Thread/unthread right underwear leg, Thread/unthread left underwear leg Pants- Performed by patient: Pull pants up/down  Pants- Performed by helper: Thread/unthread left pants leg, Thread/unthread right pants leg, Pull pants up/down Non-skid slipper socks- Performed by helper: Don/doff right sock, Don/doff left sock Assist for footwear: Maximal assist Assist for lower body dressing: More than reasonable time, Touching or steadying assistance (Pt > 75%)(Total assist) Set up : To obtain clothing/put away  Function - Toileting Toileting activity did not occur: No continent bowel/bladder event Toileting steps completed by helper: Adjust clothing prior to toileting, Performs perineal hygiene, Adjust clothing after toileting Assist level: Touching or steadying assistance (Pt.75%)(per Ivy Lyndee Leo Aquit, NT report)  Function - Air cabin crew transfer activity did not occur: Safety/medical concerns Toilet transfer assistive device: Drop arm commode, Sliding board, Bedside commode Assist level to toilet: 2 helpers(per Cavalero, NT report) Assist level to bedside commode (at bedside): Total assist (Pt < 25%) Assist level from bedside commode (at bedside): Total assist (Pt < 25%)  Function - Chair/bed transfer Chair/bed transfer method: Lateral scoot Chair/bed transfer assist level: Moderate assist (Pt 50 - 74%/lift or lower) Chair/bed transfer assistive device: Sliding board Chair/bed transfer details: Manual facilitation for placement, Manual facilitation for weight shifting, Verbal cues for technique  Function - Locomotion: Wheelchair Will  patient use wheelchair at discharge?: Yes Type: Manual Max wheelchair distance: 25 Assist Level: Touching or steadying assistance (Pt > 75%) Assist Level: Touching or steadying assistance (Pt > 75%) Wheel 150 feet activity did not occur: Safety/medical concerns Assist Level: Touching or steadying assistance (Pt > 75%) Turns around,maneuvers to table,bed, and toilet,negotiates 3% grade,maneuvers on rugs and over doorsills: No Function - Locomotion: Ambulation Ambulation activity did not occur: Safety/medical concerns Walk 10 feet activity did not occur: Safety/medical concerns Walk 50 feet with 2 turns activity did not occur: Safety/medical concerns Walk 150 feet activity did not occur: Safety/medical concerns Walk 10 feet on uneven surfaces activity did not occur: Safety/medical concerns  Function - Comprehension Comprehension: Auditory Comprehension assist level: Understands basic 75 - 89% of the time/ requires cueing 10 - 24% of the time  Function - Expression Expression: Verbal Expression assist level: Expresses basic needs/ideas: With extra time/assistive device  Function - Social Interaction Social Interaction assist level: Interacts appropriately 75 - 89% of the time - Needs redirection for appropriate language or to initiate interaction.  Function - Problem Solving Problem solving assist level: Solves basic 50 - 74% of the time/requires cueing 25 - 49% of the time  Function - Memory Memory assist level: Recognizes or recalls 75 - 89% of the time/requires cueing 10 - 24% of the time Patient normally able to recall (first 3 days only): Current season, That he or she is in a hospital  Medical Problem List and Plan: 1.Functional and mobility deficitssecondary to debility and encephalopathy from multiple medical issues   Cont CIR   Repeat MRI brain and T-spine suggesting stable/improvement. Neurology signed off. Head CT reviewed, no acute intracranial abnormalities. 2. DVT  Prophylaxis/Anticoagulation: Pharmaceutical:Lovenox   Right peroneal DVT, patient too high risk for renal complications with CTA. VQ scan likely inconclusive given edema. Discussed with Pulm, will monitor for now, repeat dopplers neg for DVT. 3. Pain Management:tylenol prn 4. Mood:LCSW to follow for evaluation and support.   team to continue to provide support    xanax prn for anxiety 5. Neuropsych: This patientis not fullycapable of making decisions onherown behalf. 6. Skin/Wound Care:Air mattress for pressure relief measures. Local measures to help managesacral /labialMASD PRAFO's for bilateral LE's 7. Fluids/Electrolytes/Nutrition:Monitor I/O.    Continue to offer  supplements to help with protein calorie malnutrition.   Cont Remeron 15 qhs 8. C diff colitis: Resolved   Vancomycincompleted 2/6 Continue enteric precautions  Repeat C diff PCR + but toxin negative.     Appreciate ID recs, no plans to treat at present 9. HTN: Monitor BP bid.    Nifedipine bid increased to 60 on 2/6   Imdur increased to 90 on 2/4   Metoprolol increased to 100 BID on 2/3   Hydralazine 10 3 times a day started on 2/13, increased to 25 on 2/14   Hypertensive crisis overnight Vitals:   11/29/17 2100 11/30/17 0400  BP: (!) 168/88 (!) 193/96  Pulse: 93 88  Resp:  18  Temp:  98.3 F (36.8 C)  SpO2:  94%  10 T2DM with retinopathy, neuropathy, andnephropathy:Monitor BS ac/hs.    SSI for elevated BS CBG (last 3)  Recent Labs    11/29/17 1648 11/29/17 2055 11/30/17 0635  GLUCAP 109* 202* 114*    Labile on 2/26 11. H/o depression: Continue Effexor and Remeron.  12. Neurogenic bladder:Has chronic indwelling catheter for 2+ months.   Foley out, I/O caths, keep strict I/Os.  13.AKI onCKD with history of overload:    Cr 2.98 on 2/25, IVF started by nephrology, d/ced due to fluid overload, no real changes per Nephro   Daily Labs    Encourage fluids    Appreciate nephrology recs   Cont to monitor 14. Diffuse CAD: treated medically with Metoprolol, Zetia and Plavix. 15.  ABLA on anemia of chronic disease:    Stool OB now neg   Hb 11.5 on 2/22   Appreciate GI recs, EGD/colonoscopy performed on 2/10 showing a total of 5 duodenal ulcers, which are presumed to be a source of bleeding.  16. Leukocytosis:?  Resolved   WBCs 10.5 on 2/22   Afebrile   Repeat UA relatively unremarkable for infection, Ucx multi species  17. Hypoalbuminemia   Cont supplement 18. Hyperkalemia: Resolved   Cont to monitor    K+ 40 daily x2 days due to lasix, changed to Bumex on 2/22 19. Acute lower UTI   UA is equivocal, urine culture with Pseudomonas   Cipro completed 2/11-2/13 20.  GI bleed   Believed to be secondary to duodenal ulcers   Appreciate GI recs, 40 mg Protonix twice daily    H. pylori serology negative   Polyp biopsy on colonoscopy results to be sent patient for GI 21. Dyspnea   Continue Lasix  CXR reviewed, suggesting fluid overload 22. Syncope: ?Resolved  Appreciate teaching service recs, likely vasovagal   Additional IV dose lasix given on 2/20   ECG/CE reviewed, no significant acute concerns.  Cardiac enzymes negative, ECG  Repeat CXR reviewed, showing opacities. Appreciate pulmonary recs suggesting likely fluid overload and less likely pneumonia.   23. Pulmonary edema   Continue Lasix   Chest x-ray reviewed, showing persistent edema 24.  Fall on 2/25   Reminded patient of safety   Cont Tele-sitter   No apparent head trauma and fall from low bed on bottom    LOS (Days) 25 A FACE TO FACE EVALUATION WAS PERFORMED  Mara Favero Lorie Phenix 11/30/2017, 8:37 AM

## 2017-11-30 NOTE — Progress Notes (Signed)
Physical Therapy Weekly Progress Note  Patient Details  Name: Laura [name] MRN: [id number] Date of Birth: 1961-02-01  Beginning of progress report period: November 22, 2017 End of progress report period: November 30, 2017   Patient is progressing towards long term goals and fluctuates between mod/max A for sliding board transfers and mobility.  W/c mobility with supervision with max cuing.  Pt improving seated balance to close supervision on supportive surface.  Pt's d/c plan changed to SNF.  Patient continues to demonstrate the following deficits muscle weakness, abnormal tone, decreased coordination and decreased motor planning, decreased attention, decreased awareness, decreased problem solving, decreased safety awareness and decreased memory and decreased sitting balance, decreased standing balance, decreased postural control and decreased balance strategies and therefore will continue to benefit from skilled PT intervention to increase functional independence with mobility.  Patient progressing toward long term goals..  Continue plan of care.  PT Short Term Goals Week 4:  PT Short Term Goal 1 (Week 4): = LTG  Skilled Therapeutic Interventions/Progress Updates:  Ambulation/gait training;Cognitive remediation/compensation;Discharge planning;DME/adaptive equipment instruction;Functional mobility training;Pain management;Psychosocial support;Splinting/orthotics;Therapeutic Activities;UE/LE Strength taining/ROM;Visual/perceptual remediation/compensation;Wheelchair propulsion/positioning;UE/LE Coordination activities;Therapeutic Exercise;Stair training;Skin care/wound management;Patient/family education;Neuromuscular re-education;Functional electrical stimulation;Disease management/prevention;Community reintegration;Balance/vestibular training    See Function Navigator for Current Functional Status.   Ranveer Wahlstrom 11/30/2017, 8:13 AM

## 2017-11-30 NOTE — Progress Notes (Signed)
Speech Language Pathology Daily Session Note  Patient Details  Name: Laura [name] MRN: [id number] Date of Birth: 11/06/1960  Today's Date: 11/30/2017 SLP Individual Time: 6606-3016 SLP Individual Time Calculation (min): 54 min  Short Term Goals: Week 4: SLP Short Term Goal 1 (Week 4): Pt will utilize external memory aides to recall new dialy information with Min A cues.  SLP Short Term Goal 2 (Week 4): Pt will selectively attend to basic familiar task in a mildly distracting environment  for ~ 20 minutes with Min A cues.  SLP Short Term Goal 3 (Week 4): Pt will complete novel, basic familiar problem solving task with Min A cues.  SLP Short Term Goal 4 (Week 4): Pt will increase vocal intensity to achieve intelligibility in mildly noisy environments with min verbal cues.    Skilled Therapeutic Interventions: This date, skilled ST intervention focused on cognitive skills. SLP facilitated session by providing Supervision questioning cues for pt to locate external memory aids around her room (e.g. calendar and reminders re: calling for RN vs getting up by herself) to aid in daily recall of functional information. After locating external aids, pt accurately identified month and year with Mod I and date and DOW given Min A verbal cues. Pt seen in mildly distracting environment and attended to familiar task for ~15 minutes given Min A verbal cues for redirection intermittently; of note, decreased attention observed with novel vs familiar tasks, at which time Mod-Max A verbal cues were required for pt to attend. Pt complete novel, basic familiar problem-solving tasks involving time management, calendar, and safety precautions with Min A verbal cues; however, when given sem-complex organization task involving grocery list, pt required Max A verbal and visual cues to complete with 100% accuracy, though cues were successfully faded to Min-Mod A verbal cues as the task progressed. Pt completed word association  recall task with 100% accuracy following rehearsal, as well as date, scenarios for call bell use, and, speech goals with Supervision questioning cues. Pt left with call bell and all needs within reach + RN notified of session ending. Continue current plan of care.   Function:  Eating Eating   Modified Consistency Diet: No Eating Assist Level: More than reasonable amount of time;Supervision or verbal cues           Cognition Comprehension Comprehension assist level: Understands basic 75 - 89% of the time/ requires cueing 10 - 24% of the time  Expression   Expression assist level: Expresses basic needs/ideas: With extra time/assistive device  Social Interaction Social Interaction assist level: Interacts appropriately 75 - 89% of the time - Needs redirection for appropriate language or to initiate interaction.  Problem Solving Problem solving assist level: Solves basic 50 - 74% of the time/requires cueing 25 - 49% of the time  Memory Memory assist level: Recognizes or recalls 75 - 89% of the time/requires cueing 10 - 24% of the time    Pain Pain Assessment Pain Assessment: No/denies pain  Therapy/Group: Individual Therapy  Robet Crutchfield A Rumaldo Difatta 11/30/2017, 11:08 AM

## 2017-11-30 NOTE — Progress Notes (Signed)
Physical Therapy Session Note  Patient Details  Name: Laura [name] MRN: [id number] Date of Birth: 10/24/1960  Today's Date: 11/30/2017 PT Individual Time: 1130-1200 PT Individual Time Calculation (min): 30 min   Short Term Goals: Week 4:  PT Short Term Goal 1 (Week 4): = LTG  Skilled Therapeutic Interventions/Progress Updates:   Session focused on functional bed mobility and slideboard transfer OOB to w/c. Pt requires multimodal cues for bed mobility and mod assist for trunk elevation. Pt able to maintain sitting balance with supervision using UE support. slideboard transfer with mod to max assist due to uphill transfer (no step in room for foot support) with pt utilizing UE's on armrest appropriately. W/c mobility training with max verbal cues for hand placement and efficient technique with tendency to veer to the R wall. PT added basic back for improved sitting tolerance and upright posture. Kinetron seated from w/c for functional LE strengthening and reciprocal movement pattern retraining x 2 reps of 1.5 min each intervals - requires some assist for full ROM.   Therapy Documentation Precautions:  Precautions Precautions: Fall Precaution Comments: monitor blood pressure Restrictions Weight Bearing Restrictions: No  Pain: Pain Assessment Pain Assessment: No/denies pain   See Function Navigator for Current Functional Status.   Therapy/Group: Individual Therapy  Canary Brim Ivory Broad, PT, DPT  11/30/2017, 12:07 PM

## 2017-11-30 NOTE — Plan of Care (Signed)
  Not Progressing RH BLADDER ELIMINATION RH STG MANAGE BLADDER WITH ASSISTANCE Description STG Manage Bladder With Assistance mod  11/30/2017 1000 - Not Progressing by Jaree Trinka, Renato Gails, RN RH STG MANAGE BLADDER WITH MEDICATION WITH ASSISTANCE Description STG Manage Bladder With Medication With Assistance. mod  11/30/2017 1000 - Not Progressing by Aviyana Sonntag, Renato Gails, RN RH STG MANAGE BLADDER WITH EQUIPMENT WITH ASSISTANCE Description STG Manage Bladder With Equipment With mod Assistance  11/30/2017 1000 - Not Progressing by Hillery Jacks, RN  Total assist required for In and out cath [facility] SKIN INTEGRITY RH STG SKIN FREE OF INFECTION/BREAKDOWN Description No new skin breakdown/infection with mod assistance  11/30/2017 1000 - Not Progressing by Corrine Tillis, Renato Gails, RN RH STG MAINTAIN SKIN INTEGRITY WITH ASSISTANCE Description STG Maintain Skin Integrity With mod Assistance.  11/30/2017 1000 - Not Progressing by Malayia Spizzirri, Renato Gails, RN RH STG ABLE TO PERFORM INCISION/WOUND CARE W/ASSISTANCE Description STG Able To Perform Incision/Wound Care With mod Assistance.  11/30/2017 1000 - Not Progressing by Hillery Jacks, RN  Total assist required for sacral dressing changes RH SAFETY RH STG ADHERE TO SAFETY PRECAUTIONS W/ASSISTANCE/DEVICE Description STG Adhere to Safety Precautions With supervision Assistance/Device.  11/30/2017 1000 - Not Progressing by Jahiem Franzoni, Renato Gails, RN RH STG DECREASED RISK OF FALL WITH ASSISTANCE Description STG Decreased Risk of Fall With supervision Assistance.  11/30/2017 1000 - Not Progressing by Ernestina Joe, Renato Gails, RN RH STG DEMO UNDERSTANDING HOME SAFETY PRECAUTIONS Description Verbalize home safety precaution with min assistance  11/30/2017 1000 - Not Progressing by Stevie Charter, Renato Gails, RN  Max assist required/telesitter-fall on 2/25

## 2017-12-01 ENCOUNTER — Inpatient Hospital Stay (HOSPITAL_COMMUNITY): Payer: No Typology Code available for payment source | Admitting: Physical Therapy

## 2017-12-01 ENCOUNTER — Inpatient Hospital Stay (HOSPITAL_COMMUNITY): Payer: No Typology Code available for payment source | Admitting: Speech Pathology

## 2017-12-01 ENCOUNTER — Inpatient Hospital Stay (HOSPITAL_COMMUNITY): Payer: No Typology Code available for payment source | Admitting: Occupational Therapy

## 2017-12-01 LAB — RENAL FUNCTION PANEL
Albumin: 2.2 g/dL — ABNORMAL LOW (ref 3.5–5.0)
Anion gap: 9 (ref 5–15)
BUN: 88 mg/dL — ABNORMAL HIGH (ref 6–20)
CO2: 24 mmol/L (ref 22–32)
Calcium: 8.3 mg/dL — ABNORMAL LOW (ref 8.9–10.3)
Chloride: 107 mmol/L (ref 101–111)
Creatinine, Ser: 3.17 mg/dL — ABNORMAL HIGH (ref 0.44–1.00)
GFR calc Af Amer: 18 mL/min — ABNORMAL LOW
GFR calc non Af Amer: 15 mL/min — ABNORMAL LOW
Glucose, Bld: 105 mg/dL — ABNORMAL HIGH (ref 65–99)
Phosphorus: 4.7 mg/dL — ABNORMAL HIGH (ref 2.5–4.6)
Potassium: 3.5 mmol/L (ref 3.5–5.1)
Sodium: 140 mmol/L (ref 135–145)

## 2017-12-01 LAB — GLUCOSE, CAPILLARY
Glucose-Capillary: 102 mg/dL — ABNORMAL HIGH (ref 65–99)
Glucose-Capillary: 123 mg/dL — ABNORMAL HIGH (ref 65–99)
Glucose-Capillary: 125 mg/dL — ABNORMAL HIGH (ref 65–99)
Glucose-Capillary: 187 mg/dL — ABNORMAL HIGH (ref 65–99)

## 2017-12-01 MED ORDER — BUMETANIDE 1 MG PO TABS
3.0000 mg | ORAL_TABLET | Freq: Two times a day (BID) | ORAL | Status: DC
  Administered 2017-12-01 – 2017-12-02 (×2): 3 mg via ORAL
  Filled 2017-12-01 (×2): qty 3

## 2017-12-01 NOTE — Progress Notes (Signed)
Social Work Patient ID: Laura [name], female   DOB: June 14, 1961, 57 y.o.   MRN: [id number]   Have received SNF bed offers today from both [facility] and [facility].  Pt and husband choosing [facility]  Tx team aware of plan to d/c tomorrow assuming insurance approval for the facility has been cleared.  Laura Zweig, LCSW

## 2017-12-01 NOTE — Progress Notes (Signed)
Speech Language Pathology Daily Session Note  Patient Details  Name: Laura [name] MRN: [id number] Date of Birth: 04-Aug-1961  Today's Date: 12/01/2017 SLP Individual Time: 0935-1000 SLP Individual Time Calculation (min): 25 min  Short Term Goals: Week 4: SLP Short Term Goal 1 (Week 4): Pt will utilize external memory aides to recall new dialy information with Min A cues.  SLP Short Term Goal 2 (Week 4): Pt will selectively attend to basic familiar task in a mildly distracting environment  for ~ 20 minutes with Min A cues.  SLP Short Term Goal 3 (Week 4): Pt will complete novel, basic familiar problem solving task with Min A cues.  SLP Short Term Goal 4 (Week 4): Pt will increase vocal intensity to achieve intelligibility in mildly noisy environments with min verbal cues.    Skilled Therapeutic Interventions: Pt was seen for skilled ST targeting cognitive goals.  Pt was sitting upright in wheelchair upon therapist's arrival.  Pt was able to recall activities from PT session immediately prior to ST with supervision question cues.  Pt had difficulty reading her schedule to facilitate recall of upcoming therapy sessions; therefore, SLP provided pt with a simplified, large print calendar which she could use with min assist.  Pt is aware of change in her discharge plan is disappointed that she is not going home; however, she verbalized understanding of the need for more therapy in order to get stronger before returning home.  Pt was left in wheelchair with call bell within reach.  Continue per current plan of care.     Function:  Eating Eating                 Cognition Comprehension Comprehension assist level: Understands basic 90% of the time/cues < 10% of the time  Expression   Expression assist level: Expresses basic needs/ideas: With extra time/assistive device  Social Interaction Social Interaction assist level: Interacts appropriately 75 - 89% of the time - Needs redirection for  appropriate language or to initiate interaction.  Problem Solving Problem solving assist level: Solves basic 50 - 74% of the time/requires cueing 25 - 49% of the time  Memory Memory assist level: Recognizes or recalls 50 - 74% of the time/requires cueing 25 - 49% of the time    Pain Pain Assessment Pain Assessment: No/denies pain Pain Score: 0-No pain  Therapy/Group: Individual Therapy  Jaleiah Asay, Selinda Orion 12/01/2017, 12:18 PM

## 2017-12-01 NOTE — Progress Notes (Signed)
Physical Therapy Note  Patient Details  Name: Laura [name] MRN: [id number] Date of Birth: 1961-04-21 Today's Date: 12/01/2017    Time: 830-924 54 minutes  1:1 No c/o pain.  Pt states she is "frustrated" by the fact that she is going to SNF, PT offered encouragement.  Rolling with min A and use of siderails with total A to don pants. Seated balance with supervision to don shirt.  Sliding board transfer to w/c with mod A.  Standing in parallel bars 5 x 1 minute with mod A to stand, PT blocking bilat knees to prevent buckling.  Attempted to take 1 step in parallel bars with pt requiring total A to prevent fall.  Seated balance with ball toss/tap all directions 3 x 3 mins with pt improving activity tolerance.  W/c mobility with supervision, max cuing for technique and sequencing.   Tudor Chandley 12/01/2017, 9:24 AM

## 2017-12-01 NOTE — Progress Notes (Signed)
Telesitter removed from patients room and taken to a higher priority patient.  Brita Romp, RN

## 2017-12-01 NOTE — Patient Care Conference (Signed)
Inpatient RehabilitationTeam Conference and Plan of Care Update Date: 12/01/2017   Time: 11:30 AM    Patient Name: Laura [name]      Medical Record Number: [id number]  Date of Birth: January 13, 1961 Sex: Female         Room/Bed: 4M12C/4M12C-01 Payor Info: Payor: [employer] / Plan: [place] / Product Type: *No Product type* /    Admitting Diagnosis: [place] Debility  Admit Date/Time:  11/05/2017  5:50 PM Admission Comments: No comment available   Primary Diagnosis:  <principal problem not specified> Principal Problem: <principal problem not specified>  Patient Active Problem List   Diagnosis Date Noted  . Fall   . Acute pulmonary edema (HCC)   . Head trauma   . Deep venous thrombosis ([place])   . Abnormal chest x-ray   . Vasovagal syncope   . Syncope and collapse   . Diarrhea   . SOB (shortness of breath)   . Hypertensive crisis   . Duodenal ulcer   . Acute lower UTI   . Gastrointestinal hemorrhage associated with duodenal ulcer   . Uncontrolled hypertension   . Anticoagulated   . Benign neoplasm of transverse colon   . Abnormal urinalysis   . Nephrotic syndrome   . Rectal bleeding   . Abnormal MRI   . Uncontrolled type 2 diabetes mellitus with hyperglycemia ([place])   . Diabetic peripheral neuropathy ([place])   . Weakness of both lower extremities   . Hyperkalemia   . Hypoglycemia   . Type 2 diabetes mellitus with complication, with long-term current use of insulin ([place])   . Neurogenic bladder   . Hypoalbuminemia due to protein-calorie malnutrition ([place])   . AKI (acute kidney injury) ([place])   . Stage 3 chronic kidney disease ([place])   . Physical debility 11/05/2017  . Critical illness myopathy   . Encephalopathy   . Diabetes mellitus type 2 in nonobese (HCC)   . Hyperlipidemia   . Cognitive impairment   . Benign essential HTN   . Chronic diastolic congestive heart failure ([place])   . Chronic kidney disease (CKD), stage IV (severe) ([place])   . Acute blood loss anemia    . Anemia   . Leukocytosis   . C. difficile colitis   . Urinary tract infection 10/30/2017  . Pressure injury of skin 10/30/2017    Expected Discharge Date: Expected Discharge Date: (SNF)  Team Members Present: Physician leading conference: Dr. Delice Lesch Social Worker Present: Lennart Pall, LCSW Nurse Present: Frances Maywood, RN PT Present: Roderic Ovens, PT OT Present: [name], OT SLP Present: Windell Moulding, SLP PPS Coordinator present : Daiva Nakayama, RN, CRRN     Current Status/Progress Goal Weekly Team Focus  Medical   Functional and mobility deficits secondary to debility and encephalopathy from multiple medical issues  Improve mobility, safety, cognition, BP  See above   Bowel/Bladder   incontinent of bowel, in/out cath q 6 LBM 2-27  manage bowel and bladder with mod assist   timed tolieting q 2, encourage patient to attempt to void before caths   Swallow/Nutrition/ Hydration             ADL's   Supervision UB bathing/dressing, max assist LB dressing at bed level.  Mod-max assist slide board transfers  mod assist LB dressing and toilet transfer, Max assist toileting, supervision UB bathing/dressing/grooming  ADL retraining, activity tolerance, strengthening, transfers   Mobility   mod/max A sliding board, supervision w/c  mod A transfers  activity tolerance, balance, strengthening  Communication             Safety/Cognition/ Behavioral Observations  mod assist, improved orientation and attention to tasks  min assist, downgraded   continue to address orientation, recall, attention, and problem solving   Pain   no complaints of pain   pain < or =3  Assess pain q shift and prn   Skin   unstageable to sacrum (non healing) BID santyl/moistened to dry guaze and tegderm  no new skin issues  Assess skin q shift and prn      *See Care Plan and progress notes for long and short-term goals.     Barriers to Discharge  Current Status/Progress Possible Resolutions Date  Resolved   Physician    Medical stability;Decreased caregiver support;Incontinence;Neurogenic Bowel & Bladder;Wound Care;Lack of/limited family support;Other (comments)  LE weakness  See above  Therapies, optimize BP meds, follow labs      Nursing                  PT                    OT                  SLP                SW                Discharge Planning/Teaching Needs:  d/c plan changed to SNF      Team Discussion:  No new medical issues;  Cognition some improved and doing better physically.  Still with telesitter.  Mod/max for transfers with some improvement in sitting balance.  Still +2 to stand.  Poor sequencing.  SW reports anticipated SNF bed in the next couple of days.  Revisions to Treatment Plan:  None    Continued Need for Acute Rehabilitation Level of Care: The patient requires daily medical management by a physician with specialized training in physical medicine and rehabilitation for the following conditions: Daily direction of a multidisciplinary physical rehabilitation program to ensure safe treatment while eliciting the highest outcome that is of practical value to the patient.: Yes Daily medical management of patient stability for increased activity during participation in an intensive rehabilitation regime.: Yes Daily analysis of laboratory values and/or radiology reports with any subsequent need for medication adjustment of medical intervention for : Diabetes problems;Blood pressure problems;Urological problems;Renal problems;Other;Neurological problems;Mood/behavior problems  Laura [name], [name] 12/01/2017, 3:35 PM

## 2017-12-01 NOTE — Plan of Care (Signed)
  Progressing Consults RH GENERAL PATIENT EDUCATION Description See Patient Education module for education specifics. 12/01/2017 1637 - Progressing by Brita Romp, RN Skin Care Protocol Initiated - if Braden Score 18 or less Description If consults are not indicated, leave blank or document N/A 12/01/2017 1637 - Progressing by Brita Romp, RN Nutrition Consult-if indicated 12/01/2017 1637 - Progressing by Brita Romp, RN Diabetes Guidelines if Diabetic/Glucose > 140 Description If diabetic or lab glucose is > 140 mg/dl - Initiate Diabetes/Hyperglycemia Guidelines & Document Interventions  12/01/2017 1637 - Progressing by Brita Romp, RN RH BLADDER ELIMINATION RH STG MANAGE BLADDER WITH ASSISTANCE Description STG Manage Bladder With Assistance mod  12/01/2017 1637 - Progressing by Brita Romp, RN RH STG MANAGE BLADDER WITH MEDICATION WITH ASSISTANCE Description STG Manage Bladder With Medication With Assistance. mod  12/01/2017 1637 - Progressing by Brita Romp, RN RH STG MANAGE BLADDER WITH EQUIPMENT WITH ASSISTANCE Description STG Manage Bladder With Equipment With mod Assistance  12/01/2017 1637 - Progressing by Brita Romp, RN RH SKIN INTEGRITY RH STG SKIN FREE OF INFECTION/BREAKDOWN Description No new skin breakdown/infection with mod assistance  12/01/2017 1637 - Progressing by Brita Romp, RN RH STG MAINTAIN SKIN INTEGRITY WITH ASSISTANCE Description STG Maintain Skin Integrity With mod Assistance.  12/01/2017 1637 - Progressing by Brita Romp, RN RH STG ABLE TO PERFORM INCISION/WOUND CARE W/ASSISTANCE Description STG Able To Perform Incision/Wound Care With mod Assistance.  12/01/2017 1637 - Progressing by Brita Romp, RN RH SAFETY RH STG ADHERE TO SAFETY PRECAUTIONS W/ASSISTANCE/DEVICE Description STG Adhere to Safety Precautions With supervision Assistance/Device.  12/01/2017 1637 - Progressing by  Brita Romp, RN RH STG DECREASED RISK OF FALL WITH ASSISTANCE Description STG Decreased Risk of Fall With supervision Assistance.  12/01/2017 1637 - Progressing by Brita Romp, RN RH STG DEMO UNDERSTANDING HOME SAFETY PRECAUTIONS Description Verbalize home safety precaution with min assistance  12/01/2017 1637 - Progressing by Brita Romp, RN

## 2017-12-01 NOTE — Progress Notes (Signed)
Occupational Therapy Note  Patient Details  Name: Laura [name] MRN: [id number] Date of Birth: December 23, 1960  Today's Date: 12/01/2017 OT Individual Time: 6073-7106 OT Individual Time Calculation (min): 29 min  and Today's Date: 12/01/2017 OT Missed Time: 31 Minutes Missed Time Reason: Patient unwilling/refused to participate without medical reason  Upon entering the room, pt supine in bed with covers pulled up. Pt not agreeable to OT intervention this session. She reports being very disappointed and upset secondary to upcoming SNF discharge. OT educated pt on benefits of rehab to continue to make changes. Pt verbalized understanding but continued to discuss concerns about SNF. OT provided therapeutic listening but unable to redirect pt to purposeful interventions. Pt remained in bed at end of session with bed alarm activated and call bell within reach.   Gypsy Decant 12/01/2017, 2:41 PM

## 2017-12-01 NOTE — Progress Notes (Signed)
Occupational Therapy Weekly Progress Note  Patient Details  Name: Laura [name] MRN: [id number] Date of Birth: 02/20/1961  Beginning of progress report period: November 23, 2017 End of progress report period: December 01, 2017  Today's Date: 12/01/2017 OT Individual Time: 1000-1058 OT Individual Time Calculation (min): 58 min    Patient has met 3 of 8 long term goals.  Short term goals not set due to estimated length of stay.  Pt is making slow progress towards goals.  Pt has demonstrated increased recall of body mechanics and trunk control with sitting balance and transfers.  Pt currently requires max assist with slide board transfers.  Continues to require max assist with LB dressing at bed level, +2 at sit> stand level due to BLE weakness.  Pt now to d/c to SNF for continued therapy prior to d/c home.  Patient continues to demonstrate the following deficits: muscle weakness,decreased safety awareness and decreased memory, and decreased sitting balance, decreased standing balance, decreased postural control and decreased balance strategies and therefore will continue to benefit from skilled OT intervention to enhance overall performance with BADL and Reduce care partner burden.  See Patient's Care Plan for progression toward long term goals.  Patient progressing toward long term goals..  Continue plan of care.  Skilled Therapeutic Interventions/Progress Updates:   Treatment session with focus on sit <> stand and standing tolerance as well as BUE strengthening.  Pt received upright in w/c declining bathing this session.  Pt propelled w/c around room with supervision/min cues for obstacle negotiation.  Engaged in sit > stand outside parallel bars with focus on anterior weight shift for standing.  Required blocking at knees for support to maintain standing.  Pt demonstrating increased trunk control and UB strength with ability to push up into standing and weight shift to scoot forward/backward  in w/c.  Therapist seated in front of pt to further promote anterior weight shift.  Attempted reaching to promote upright standing posture and decrease reliance on UE during standing.  Engaged in 3 sets of 10 chest presses and overhead presses with 2# dowel rod.  Pt propelled w/c 75' towards room with supervision.  Left upright in w/c with quick release belt intact, call bell in reach, and friend present.   Therapy Documentation Precautions:  Precautions Precautions: Fall Precaution Comments: monitor blood pressure Restrictions Weight Bearing Restrictions: No General:   Vital Signs: Therapy Vitals Temp: 98.2 F (36.8 C) Temp Source: Oral Pulse Rate: 91 Resp: 18 BP: (!) 150/71 Patient Position (if appropriate): Lying Oxygen Therapy SpO2: 92 % O2 Device: Room Air Pain:    See Function Navigator for Current Functional Status.   Therapy/Group: Individual Therapy  [name] 12/01/2017, 8:12 AM

## 2017-12-01 NOTE — Plan of Care (Signed)
  Progressing Consults RH GENERAL PATIENT EDUCATION Description See Patient Education module for education specifics. 12/01/2017 1638 - Progressing by Brita Romp, RN 12/01/2017 1637 - Progressing by Brita Romp, RN Skin Care Protocol Initiated - if Braden Score 18 or less Description If consults are not indicated, leave blank or document N/A 12/01/2017 1638 - Progressing by Brita Romp, RN 12/01/2017 1637 - Progressing by Brita Romp, RN Nutrition Consult-if indicated 12/01/2017 1638 - Progressing by Brita Romp, RN 12/01/2017 1637 - Progressing by Brita Romp, RN Diabetes Guidelines if Diabetic/Glucose > 140 Description If diabetic or lab glucose is > 140 mg/dl - Initiate Diabetes/Hyperglycemia Guidelines & Document Interventions  12/01/2017 1638 - Progressing by Brita Romp, RN 12/01/2017 1637 - Progressing by Brita Romp, RN RH SAFETY RH STG ADHERE TO SAFETY PRECAUTIONS W/ASSISTANCE/DEVICE Description STG Adhere to Safety Precautions With supervision Assistance/Device.  12/01/2017 1638 - Progressing by Brita Romp, RN 12/01/2017 1637 - Progressing by Brita Romp, RN RH STG DECREASED RISK OF FALL WITH ASSISTANCE Description STG Decreased Risk of Fall With supervision Assistance.  12/01/2017 1638 - Progressing by Brita Romp, RN 12/01/2017 1637 - Progressing by Brita Romp, RN RH STG DEMO UNDERSTANDING HOME SAFETY PRECAUTIONS Description Verbalize home safety precaution with min assistance  12/01/2017 1638 - Progressing by Brita Romp, RN 12/01/2017 1637 - Progressing by Brita Romp, RN   Not Progressing RH BLADDER ELIMINATION RH STG MANAGE BLADDER WITH ASSISTANCE Description STG Manage Bladder With Assistance mod  12/01/2017 1638 - Not Progressing by Brita Romp, RN 12/01/2017 1637 - Progressing by Brita Romp, RN RH STG MANAGE BLADDER WITH MEDICATION WITH  ASSISTANCE Description STG Manage Bladder With Medication With Assistance. mod  12/01/2017 1638 - Not Progressing by Brita Romp, RN 12/01/2017 1637 - Progressing by Brita Romp, RN RH STG MANAGE BLADDER WITH EQUIPMENT WITH ASSISTANCE Description STG Manage Bladder With Equipment With mod Assistance  12/01/2017 1638 - Not Progressing by Brita Romp, RN 12/01/2017 1637 - Progressing by Brita Romp, RN RH SKIN INTEGRITY RH STG SKIN FREE OF INFECTION/BREAKDOWN Description No new skin breakdown/infection with mod assistance  12/01/2017 1638 - Not Progressing by Brita Romp, RN 12/01/2017 1637 - Progressing by Brita Romp, RN RH STG MAINTAIN SKIN INTEGRITY WITH ASSISTANCE Description STG Maintain Skin Integrity With mod Assistance.  12/01/2017 1638 - Not Progressing by Brita Romp, RN 12/01/2017 1637 - Progressing by Brita Romp, RN RH STG ABLE TO PERFORM INCISION/WOUND CARE W/ASSISTANCE Description STG Able To Perform Incision/Wound Care With mod Assistance.  12/01/2017 1638 - Not Progressing by Brita Romp, RN 12/01/2017 1637 - Progressing by Brita Romp, RN

## 2017-12-01 NOTE — Progress Notes (Signed)
Subjective/Complaints: Pt seen lying in bed working with OT.  She slept well overnight.  She has concerns about therapies at SNF.    ROS: Denies CP, SOB, nausea, vomiting, diarrhea.  Objective: Vital Signs: Blood pressure (!) 143/70, pulse 88, temperature 98.2 F (36.8 C), temperature source Oral, resp. rate 18, height 4' 11"  (1.499 m), weight 49.4 kg (109 lb), SpO2 95 %. No results found. Results for orders placed or performed during the hospital encounter of 11/05/17 (from the past 72 hour(s))  Glucose, capillary     Status: Abnormal   Collection Time: 11/28/17  4:48 PM  Result Value Ref Range   Glucose-Capillary 155 (H) 65 - 99 mg/dL  Glucose, capillary     Status: Abnormal   Collection Time: 11/28/17  9:40 PM  Result Value Ref Range   Glucose-Capillary 131 (H) 65 - 99 mg/dL  Renal function panel     Status: Abnormal   Collection Time: 11/29/17  6:28 AM  Result Value Ref Range   Sodium 142 135 - 145 mmol/L   Potassium 3.7 3.5 - 5.1 mmol/L   Chloride 108 101 - 111 mmol/L   CO2 22 22 - 32 mmol/L   Glucose, Bld 97 65 - 99 mg/dL   BUN 87 (H) 6 - 20 mg/dL   Creatinine, Ser 2.98 (H) 0.44 - 1.00 mg/dL   Calcium 8.6 (L) 8.9 - 10.3 mg/dL   Phosphorus 4.6 2.5 - 4.6 mg/dL   Albumin 2.4 (L) 3.5 - 5.0 g/dL   GFR calc non Af Amer 17 (L) >60 mL/min   GFR calc Af Amer 19 (L) >60 mL/min    Comment: (NOTE) The eGFR has been calculated using the CKD EPI equation. This calculation has not been validated in all clinical situations. eGFR's persistently <60 mL/min signify possible Chronic Kidney Disease.    Anion gap 12 5 - 15    Comment: Performed at [facility] [address] [place], [place] [address]  Glucose, capillary     Status: None   Collection Time: 11/29/17  6:29 AM  Result Value Ref Range   Glucose-Capillary 85 65 - 99 mg/dL  Glucose, capillary     Status: Abnormal   Collection Time: 11/29/17 11:57 AM  Result Value Ref Range   Glucose-Capillary 130 (H) 65 - 99  mg/dL  Glucose, capillary     Status: Abnormal   Collection Time: 11/29/17  1:12 PM  Result Value Ref Range   Glucose-Capillary 134 (H) 65 - 99 mg/dL  Glucose, capillary     Status: Abnormal   Collection Time: 11/29/17  4:48 PM  Result Value Ref Range   Glucose-Capillary 109 (H) 65 - 99 mg/dL  Glucose, capillary     Status: Abnormal   Collection Time: 11/29/17  8:55 PM  Result Value Ref Range   Glucose-Capillary 202 (H) 65 - 99 mg/dL  Glucose, capillary     Status: Abnormal   Collection Time: 11/30/17  6:35 AM  Result Value Ref Range   Glucose-Capillary 114 (H) 65 - 99 mg/dL  Renal function panel     Status: Abnormal   Collection Time: 11/30/17  7:58 AM  Result Value Ref Range   Sodium 142 135 - 145 mmol/L   Potassium 3.9 3.5 - 5.1 mmol/L   Chloride 109 101 - 111 mmol/L   CO2 21 (L) 22 - 32 mmol/L   Glucose, Bld 104 (H) 65 - 99 mg/dL   BUN 87 (H) 6 - 20 mg/dL  Creatinine, Ser 2.90 (H) 0.44 - 1.00 mg/dL   Calcium 8.6 (L) 8.9 - 10.3 mg/dL   Phosphorus 4.9 (H) 2.5 - 4.6 mg/dL   Albumin 2.2 (L) 3.5 - 5.0 g/dL   GFR calc non Af Amer 17 (L) >60 mL/min   GFR calc Af Amer 20 (L) >60 mL/min    Comment: (NOTE) The eGFR has been calculated using the CKD EPI equation. This calculation has not been validated in all clinical situations. eGFR's persistently <60 mL/min signify possible Chronic Kidney Disease.    Anion gap 12 5 - 15    Comment: Performed at [facility] [address], [place], [place] [address]  Glucose, capillary     Status: Abnormal   Collection Time: 11/30/17 12:05 PM  Result Value Ref Range   Glucose-Capillary 135 (H) 65 - 99 mg/dL  Glucose, capillary     Status: Abnormal   Collection Time: 11/30/17  4:41 PM  Result Value Ref Range   Glucose-Capillary 189 (H) 65 - 99 mg/dL  Glucose, capillary     Status: Abnormal   Collection Time: 11/30/17  9:27 PM  Result Value Ref Range   Glucose-Capillary 214 (H) 65 - 99 mg/dL  Glucose, capillary     Status:  Abnormal   Collection Time: 12/01/17  6:29 AM  Result Value Ref Range   Glucose-Capillary 102 (H) 65 - 99 mg/dL  Renal function panel     Status: Abnormal   Collection Time: 12/01/17  6:47 AM  Result Value Ref Range   Sodium 140 135 - 145 mmol/L   Potassium 3.5 3.5 - 5.1 mmol/L   Chloride 107 101 - 111 mmol/L   CO2 24 22 - 32 mmol/L   Glucose, Bld 105 (H) 65 - 99 mg/dL   BUN 88 (H) 6 - 20 mg/dL   Creatinine, Ser 3.17 (H) 0.44 - 1.00 mg/dL   Calcium 8.3 (L) 8.9 - 10.3 mg/dL   Phosphorus 4.7 (H) 2.5 - 4.6 mg/dL   Albumin 2.2 (L) 3.5 - 5.0 g/dL   GFR calc non Af Amer 15 (L) >60 mL/min   GFR calc Af Amer 18 (L) >60 mL/min    Comment: (NOTE) The eGFR has been calculated using the CKD EPI equation. This calculation has not been validated in all clinical situations. eGFR's persistently <60 mL/min signify possible Chronic Kidney Disease.    Anion gap 9 5 - 15    Comment: Performed at [facility] [address], [place], [place] [address]  Glucose, capillary     Status: Abnormal   Collection Time: 12/01/17 11:56 AM  Result Value Ref Range   Glucose-Capillary 123 (H) 65 - 99 mg/dL    General: NAD. Vital signs reviewed. HEENT: Normocephalic, atraumatic.  Cardio: RRR. No JVD. Resp: CTA B/L and Unlabored. GI: BS positive and ND Musc/Skel:  No tenderness. Lower extremity edema, unchanged. Neuro: Alert and oriented 3 with increased time (stable) Motor:  LUE: 5/5 proximal to distal RUE: 4+/5 proximal to distal  RLE: HF: 2+/5, ADF/PF 2/5 (slowly improving) LLE: HF 3-/5, ADF/PF 2+/5 (slowly improving) Skin: Intact. Warm and dry.  Psych: Flat, tangential.   Assessment/Plan: 1. Functional deficits secondary to debility and encephalopathy which require 3+ hours per day of interdisciplinary therapy in a comprehensive inpatient rehab setting. Physiatrist is providing close team supervision and 24 hour management of active medical problems listed below. Physiatrist and rehab team  continue to assess barriers to discharge/monitor patient progress toward functional and medical  goals. FIM: Function - Bathing Position: Shower Body parts bathed by patient: Right arm, Left arm, Chest, Abdomen, Right upper leg, Left upper leg Body parts bathed by helper: Front perineal area, Buttocks, Right lower leg, Left lower leg, Back Bathing not applicable: Left upper leg, Left lower leg, Right lower leg, Right upper leg Assist Level: (Mod assist)  Function- Upper Body Dressing/Undressing What is the patient wearing?: Pull over shirt/dress Bra - Perfomed by patient: Thread/unthread left bra strap Bra - Perfomed by helper: Thread/unthread right bra strap, Hook/unhook bra (pull down sports bra), Thread/unthread left bra strap Pull over shirt/dress - Perfomed by patient: Thread/unthread right sleeve, Thread/unthread left sleeve, Put head through opening, Pull shirt over trunk Pull over shirt/dress - Perfomed by helper: Pull shirt over trunk Assist Level: Supervision or verbal cues, Set up Set up : To obtain clothing/put away Function - Lower Body Dressing/Undressing What is the patient wearing?: Pants, Non-skid slipper socks Position: Bed Underwear - Performed by helper: Thread/unthread right underwear leg, Thread/unthread left underwear leg Pants- Performed by patient: Pull pants up/down Pants- Performed by helper: Thread/unthread left pants leg, Thread/unthread right pants leg, Pull pants up/down Non-skid slipper socks- Performed by helper: Don/doff right sock, Don/doff left sock Assist for footwear: Maximal assist Assist for lower body dressing: More than reasonable time, Touching or steadying assistance (Pt > 75%)(Total assist) Set up : To obtain clothing/put away  Function - Toileting Toileting activity did not occur: No continent bowel/bladder event Toileting steps completed by helper: Adjust clothing prior to toileting, Performs perineal hygiene, Adjust clothing after  toileting Assist level: Touching or steadying assistance (Pt.75%)(per Ivy Lyndee Leo Aquit, NT report)  Function - Air cabin crew transfer activity did not occur: Safety/medical concerns Toilet transfer assistive device: Drop arm commode, Sliding board, Bedside commode Assist level to toilet: 2 helpers(per [place], NT report) Assist level to bedside commode (at bedside): Total assist (Pt < 25%) Assist level from bedside commode (at bedside): Total assist (Pt < 25%)  Function - Chair/bed transfer Chair/bed transfer method: Lateral scoot Chair/bed transfer assist level: Maximal assist (Pt 25 - 49%/lift and lower) Chair/bed transfer assistive device: Sliding board, Armrests Chair/bed transfer details: Manual facilitation for placement, Manual facilitation for weight shifting, Verbal cues for technique  Function - Locomotion: Wheelchair Will patient use wheelchair at discharge?: Yes Type: Manual Max wheelchair distance: 66' Assist Level: Supervision or verbal cues Assist Level: Supervision or verbal cues Wheel 150 feet activity did not occur: Safety/medical concerns Assist Level: Touching or steadying assistance (Pt > 75%) Turns around,maneuvers to table,bed, and toilet,negotiates 3% grade,maneuvers on rugs and over doorsills: No Function - Locomotion: Ambulation Ambulation activity did not occur: Safety/medical concerns Walk 10 feet activity did not occur: Safety/medical concerns Walk 50 feet with 2 turns activity did not occur: Safety/medical concerns Walk 150 feet activity did not occur: Safety/medical concerns Walk 10 feet on uneven surfaces activity did not occur: Safety/medical concerns  Function - Comprehension Comprehension: Auditory Comprehension assist level: Understands basic 90% of the time/cues < 10% of the time  Function - Expression Expression: Verbal Expression assist level: Expresses basic needs/ideas: With extra time/assistive device  Function -  Social Interaction Social Interaction assist level: Interacts appropriately 75 - 89% of the time - Needs redirection for appropriate language or to initiate interaction.  Function - Problem Solving Problem solving assist level: Solves basic 50 - 74% of the time/requires cueing 25 - 49% of the time  Function - Memory Memory assist level: Recognizes or recalls 50 -  74% of the time/requires cueing 25 - 49% of the time Patient normally able to recall (first 3 days only): That he or she is in a hospital, Current season  Medical Problem List and Plan: 1.Functional and mobility deficitssecondary to debility and encephalopathy from multiple medical issues   Cont CIR   Repeat MRI brain and T-spine suggesting stable/improvement. Neurology signed off. Head CT reviewed, no acute intracranial abnormalities. 2. DVT Prophylaxis/Anticoagulation: Pharmaceutical:Lovenox   Right peroneal DVT, patient too high risk for renal complications with CTA. VQ scan likely inconclusive given edema. Discussed with Pulm, will monitor for now, repeat dopplers neg for DVT. 3. Pain Management:tylenol prn 4. Mood:LCSW to follow for evaluation and support.   team to continue to provide support    xanax prn for anxiety 5. Neuropsych: This patientis not fullycapable of making decisions onherown behalf. 6. Skin/Wound Care:Air mattress for pressure relief measures. Local measures to help managesacral /labialMASD PRAFO's for bilateral LE's 7. Fluids/Electrolytes/Nutrition:Monitor I/O.    Continue to offer supplements to help with protein calorie malnutrition.   Cont Remeron 15 qhs 8. C diff colitis: Resolved   Vancomycincompleted 2/6 Continue enteric precautions  Repeat C diff PCR + but toxin negative.     Appreciate ID recs, no plans to treat at present 9. HTN: Monitor BP bid.    Nifedipine bid increased to 60 on 2/6   Imdur increased to 90 on 2/4   Metoprolol increased to  100 BID on 2/3   Hydralazine 10 3 times a day started on 2/13, increased to 25 on 2/14   Hypertensive crisis overnight again Vitals:   12/01/17 0626 12/01/17 1548  BP: (!) 150/71 (!) 143/70  Pulse: 91 88  Resp: 18 18  Temp: 98.2 F (36.8 C) 98.2 F (36.8 C)  SpO2: 92% 95%  10 T2DM with retinopathy, neuropathy, andnephropathy:Monitor BS ac/hs.    SSI for elevated BS CBG (last 3)  Recent Labs    11/30/17 2127 12/01/17 0629 12/01/17 1156  GLUCAP 214* 102* 123*    Labile on 2/27 11. H/o depression: Continue Effexor and Remeron.  12. Neurogenic bladder:Has chronic indwelling catheter for 2+ months.   Foley out, I/O caths, keep strict I/Os.  13.AKI onCKD with history of overload:    Cr 3.17 on 2/27, IVF started by nephrology, d/ced due to fluid overload, no real changes per Nephro   Daily Labs    Encourage fluids   Appreciate nephrology recs   Cont to monitor 14. Diffuse CAD: treated medically with Metoprolol, Zetia and Plavix. 15.  ABLA on anemia of chronic disease:    Stool OB now neg   Hb 11.5 on 2/22   Appreciate GI recs, EGD/colonoscopy performed on 2/10 showing a total of 5 duodenal ulcers, which are presumed to be a source of bleeding.  16. Leukocytosis:?  Resolved   WBCs 10.5 on 2/22   Afebrile   Repeat UA relatively unremarkable for infection, Ucx multi species  17. Hypoalbuminemia   Cont supplement 18. Hyperkalemia: Resolved   Cont to monitor    K+ 40 daily x2 days due to lasix, changed to Bumex on 2/22, increased on 2/27 19. Acute lower UTI   UA is equivocal, urine culture with Pseudomonas   Cipro completed 2/11-2/13 20.  GI bleed   Believed to be secondary to duodenal ulcers   Appreciate GI recs, 40 mg Protonix twice daily    H. pylori serology negative   Polyp biopsy on colonoscopy results to be sent patient for GI  21. Dyspnea   Continue Lasix  CXR reviewed, suggesting fluid overload 22. Syncope: ?Resolved  Appreciate teaching service recs, likely  vasovagal   Additional IV dose lasix given on 2/20   ECG/CE reviewed, no significant acute concerns.  Cardiac enzymes negative, ECG  Repeat CXR reviewed, showing opacities. Appreciate pulmonary recs suggesting likely fluid overload and less likely pneumonia.   23. Pulmonary edema   Continue Lasix   Chest x-ray reviewed, showing persistent edema 24.  Fall on 2/25   Reminded patient of safety   Cont Tele-sitter   No apparent head trauma and fall from low bed on bottom    LOS (Days) 26 A FACE TO FACE EVALUATION WAS PERFORMED  Laura [name] 12/01/2017, 3:52 PM

## 2017-12-01 NOTE — Discharge Summary (Signed)
Physician Discharge Summary  Patient ID: Laura [name] MRN: [id number] DOB/AGE: 08-Jun-1961 57 y.o.  Admit date: 11/05/2017 Discharge date: 12/02/2017  Discharge Diagnoses:  Principal Problem:   Physical debility Active Problems:   Chronic kidney disease (CKD), stage IV (severe) (HCC)   Anemia   Encephalopathy   Neurogenic bladder   Hypoalbuminemia due to protein-calorie malnutrition (HCC)   Uncontrolled diabetes mellitus with neurologic complication (HCC)   Diabetic peripheral neuropathy (HCC)   Benign neoplasm of transverse colon   Gastrointestinal hemorrhage associated with duodenal ulcer   Noninfectious acute disseminated encephalomyelitis (ADEM)   Discharged Condition: stable   Significant Diagnostic Studies: Dg Chest 2 View  Result Date: 11/26/2017 CLINICAL DATA:  Leukocytosis EXAM: CHEST  2 VIEW COMPARISON:  11/24/2017 FINDINGS: Cardiomegaly with vascular congestion. Bilateral perihilar and lower lobe opacities with small bilateral effusions. Findings could reflect edema or infection. IMPRESSION: Bilateral lower lobe airspace opacities with layering effusions. Findings could reflect edema or infection. No real change since prior study. Electronically Signed   By: Rolm Baptise M.D.   On: 11/26/2017 07:35   Ct Head Wo Contrast  Result Date: 11/26/2017 CLINICAL DATA:  Fall.  Hit top of head. EXAM: CT HEAD WITHOUT CONTRAST TECHNIQUE: Contiguous axial images were obtained from the base of the skull through the vertex without intravenous contrast. COMPARISON:  11/11/2017 FINDINGS: Brain: Chronic right basal ganglia lacunar infarct noted. Mild patchy low attenuation in the subcortical and periventricular white matter noted compatible with chronic small vessel ischemic change. No evidence for acute brain infarct, intracranial hemorrhage or mass. Vascular: No hyperdense vessel or unexpected calcification. Skull: Normal. Negative for fracture or focal lesion. Sinuses/Orbits: There is  chronic appearing asymmetric opacification of the right maxillary sinus, there is also chronic opacification of the sphenoid sinus. Other: None IMPRESSION: 1. No acute intracranial abnormality. 2. Chronic small vessel ischemic change. Old right basal ganglia infarct noted. 3. 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 Electronically Signed   By: Kerby Moors M.D.   On: 11/26/2017 16:56   Mr Brain Wo Contrast  Result Date: 11/11/2017 CLINICAL DATA:  Type 2 diabetes.  Encephalopathy.  Weakness. EXAM: MRI HEAD WITHOUT CONTRAST TECHNIQUE: Multiplanar, multiecho pulse sequences of the brain and surrounding structures were obtained without intravenous contrast. COMPARISON:  09/16/2017 MRI.  09/08/2017 CT. FINDINGS: Brain: There is no new or worsening finding. On the previous study, the patient had multiple acute foci of restricted diffusion within the brain in a distribution suggesting acute toxic or metabolic demyelination. This was most prominent within the middle cerebellar peduncles, the corpus callosum and the hemispheric white matter. Today, there is mild lingering T2 shine through signal in those regions. No new foci of restricted diffusion. No cortical or large vessel territory abnormality. No sign of mass lesion, hemorrhage, hydrocephalus or extra-axial collection. Vascular: Major vessels at the base of the brain show flow. Skull and upper cervical spine: Negative Sinuses/Orbits: Inflammatory changes of the paranasal sinuses on the right. Other: None IMPRESSION: No new or progressive findings. Evolutionary changes of the multiple foci of T2 signal and restricted diffusion within the deep brain as outlined above. Foci show abnormal T2 signal presently with T2 shine through but no true restricted diffusion. No new lesions. Presumably, this was toxic or metabolic demyelination which has run its course. Certainly, the patient could have lingering neurologic sequelae due to the underlying  damage in the areas of involvement. Electronically Signed   By: Nelson Chimes M.D.   On: 11/11/2017 21:40  Mr Cervical Spine Wo Contrast  Result Date: 11/10/2017 CLINICAL DATA:  57 y/o F; patient presenting with lower extremity weakness. History of diabetes, neuropathy, retinopathy, hypertension, chronic kidney disease. EXAM: MRI CERVICAL SPINE WITHOUT CONTRAST TECHNIQUE: Multiplanar, multisequence MR imaging of the cervical spine was performed. No intravenous contrast was administered. COMPARISON:  None. FINDINGS: Alignment: Straightening of cervical lordosis.  No listhesis. Vertebrae: No fracture, evidence of discitis, or bone lesion. Cord: No abnormal cord signal. Posterior Fossa, vertebral arteries, paraspinal tissues: Extensive sinus disease within the visible maxillary and sphenoid sinuses. Disc levels: C2-3: No significant disc displacement, foraminal stenosis, or canal stenosis. Mild facet hypertrophy. C3-4: Disc osteophyte complex with bilateral uncovertebral and facet hypertrophy. Mild foraminal and canal stenosis. C4-5: Disc osteophyte complex with bilateral uncovertebral and facet hypertrophy. Mild foraminal and canal stenosis. C5-6: Disc osteophyte complex with left-greater-than-right bilateral uncovertebral and facet hypertrophy. Mild left foraminal stenosis. No canal stenosis. C6-7: No significant disc displacement, foraminal stenosis, or canal stenosis. C7-T1: No significant disc displacement, foraminal stenosis, or canal stenosis. IMPRESSION: 1. No acute osseous abnormality or abnormal cord signal. 2. Mild cervical spondylosis greatest at the C3-4 and C4-5 levels. 3. Mild multifactorial C3-4 and C4-5 canal stenosis. No high-grade canal stenosis. 4. Mild bilateral C3-4, bilateral C4-5, and left C5-6 foraminal stenosis. No high-grade foraminal stenosis. 5. Extensive sinus disease within visible maxillary and sphenoid sinuses. Electronically Signed   By: Kristine Garbe M.D.   On:  11/10/2017 17:29   Mr Thoracic Spine Wo Contrast  Addendum Date: 11/12/2017   ADDENDUM REPORT: 11/12/2017 14:45 ADDENDUM: Dr. Leonel Ramsay called to discuss this case. I had not appreciated that there was a prior lumbar MRI which questioned possibility of abnormal signal in the distal cord. That is not visible on today's study, though there is considerable image degradation. My impression is that this is probably a normal area. Electronically Signed   By: Nelson Chimes M.D.   On: 11/12/2017 14:45   Result Date: 11/12/2017 CLINICAL DATA:  Type 2 diabetes. Neuropathy. Chronic kidney disease. Fluid overload and cephalopathy. Weakness. EXAM: MRI THORACIC SPINE WITHOUT CONTRAST TECHNIQUE: Multiplanar, multisequence MR imaging of the thoracic spine was performed. No intravenous contrast was administered. COMPARISON:  Radiography 10/30/2017 FINDINGS: Alignment: No significant malalignment. Mild curvature convex to the right. Vertebrae: No fracture or primary bone lesion. Cord:  No cord compression or primary cord lesion. Paraspinal and other soft tissues: Moderate to large bilateral effusions layering dependently. Disc levels: Non-compressive disc bulges throughout the thoracic region. Small right paracentral disc herniation at T6-7. Small central disc herniation at T8-9. No compressive effect upon the cord. No foraminal extension. Mild lower thoracic facet and ligamentous hypertrophy. IMPRESSION: No likely significant spinal finding. Minor changes of degenerative disc disease and minor thoracic curvature. No compressive stenosis of the canal or neural foramina. Bilateral pleural effusions layering dependently. Electronically Signed: By: Nelson Chimes M.D. On: 11/11/2017 21:33   Mr Lumbar Spine Wo Contrast  Result Date: 11/10/2017 CLINICAL DATA:  57 y/o F; evaluation of lower extremity weakness. History of diabetes, neuropathy, retinopathy, hypertension, and chronic kidney disease. EXAM: MRI LUMBAR SPINE WITHOUT  CONTRAST TECHNIQUE: Multiplanar, multisequence MR imaging of the lumbar spine was performed. No intravenous contrast was administered. COMPARISON:  Concurrent cervical spine MRI. FINDINGS: Motion degraded study. Segmentation:  Standard. Alignment:  Physiologic. Vertebrae:  No fracture, evidence of discitis, or bone lesion. Conus medullaris and cauda equina: Conus extends to the L1 level. On the sagittal T2 weighted sequence there is increased signal within the  conus medullaris without appreciable expansion. Paraspinal and other soft tissues: Negative. Disc levels: L1-2: No significant disc displacement, foraminal stenosis, or canal stenosis. L2-3: No significant disc displacement, foraminal stenosis, or canal stenosis. L3-4: Small disc bulge. No significant foraminal or canal stenosis. L4-5: Small disc bulge and mild facet hypertrophy. Mild bilateral foraminal and canal stenosis. L5-S1: No significant disc displacement, foraminal stenosis, or canal stenosis. IMPRESSION: 1. Increased signal within the conus medullaris on sagittal sequences may represent myelitis or edema from lower cord lesion, incompletely visualized. Thoracic spine MRI is recommended to further evaluate. 2. Mild lumbar spondylosis with predominantly discogenic degenerative changes at the L3-4 and L4-5 levels. 3. Mild L4-5 foraminal and canal stenosis.  No high-grade stenosis. These results will be called to the ordering clinician or representative by the Radiologist Assistant, and communication documented in the PACS or zVision Dashboard. Electronically Signed   By: Kristine Garbe M.D.   On: 11/10/2017 17:40   Dg Chest Port 1 View  Result Date: 11/24/2017 CLINICAL DATA:  Shortness of Breath EXAM: PORTABLE CHEST 1 VIEW COMPARISON:  November 22, 2017 FINDINGS: There is persistent airspace consolidation in the left lower lobe with small left pleural effusion. There is patchy airspace opacity throughout the right mid lower lung zones, a  new finding. There is a small loculated effusion on the right. There is cardiomegaly with pulmonary venous hypertension. No adenopathy evident. There is degenerative change in each shoulder. IMPRESSION: 1. New airspace opacity felt to represent pneumonia in the right mid and lower lung zones. Small loculated pleural effusion on the right. 2. Persistent airspace consolidation left lower lobe with small left pleural effusion. 3. Underlying pulmonary vascular congestion with cardiomegaly and pulmonary venous hypertension. Electronically Signed   By: Lowella Grip III M.D.   On: 11/24/2017 11:53   Dg Chest Port 1 View  Result Date: 11/22/2017 CLINICAL DATA:  Shortness of breath and lower extremity swelling. EXAM: PORTABLE CHEST 1 VIEW COMPARISON:  PA and lateral chest 10/30/2017. Single-view of the chest 08/24/2017. FINDINGS: There is cardiomegaly with pulmonary edema and small to moderate pleural effusions, larger on the left. Aeration is worse than on the most recent examination. No pneumothorax. Lung volumes are low. IMPRESSION: Pulmonary edema with small to moderate pleural effusions, larger on the left. Cardiomegaly noted. Electronically Signed   By: Inge Rise M.D.   On: 11/22/2017 09:28    Labs:  Basic Metabolic Panel: Recent Labs  Lab 11/26/17 7564 11/27/17 0703 11/28/17 0602 11/29/17 3329 11/30/17 0758 12/01/17 0647 12/02/17 0727  NA 143 143 140 142 142 140 142  K 4.2 4.0 3.7 3.7 3.9 3.5 3.5  CL 111 110 108 108 109 107 107  CO2 20* 22 22 22  21* 24 23  GLUCOSE 103* 104* 99 97 104* 105* 109*  BUN 81* 87* 89* 87* 87* 88* 90*  CREATININE 2.98* 2.93* 2.99* 2.98* 2.90* 3.17* 3.18*  CALCIUM 8.9 8.6* 8.4* 8.6* 8.6* 8.3* 8.6*  PHOS 4.6 4.9* 4.6 4.6 4.9* 4.7* 4.6    CBC: CBC Latest Ref Rng & Units 12/02/2017 11/26/2017 11/25/2017  WBC 4.0 - 10.5 K/uL 9.6 10.5 11.4(H)  Hemoglobin 12.0 - 15.0 g/dL 11.3(L) 11.5(L) 10.9(L)  Hematocrit 36.0 - 46.0 % 34.8(L) 35.1(L) 33.8(L)  Platelets  150 - 400 K/uL 314 386 362    CBG: Recent Labs  Lab 12/01/17 0629 12/01/17 1156 12/01/17 1710 12/01/17 2112 12/02/17 0659  GLUCAP 102* 123* 125* 187* 103*    Filed Weights   12/01/17 0900 12/01/17 1548  12/02/17 0551  Weight: 51.7 kg (114 lb) 49.4 kg (109 lb) 50.8 kg (112 lb)    Brief HPI:   [place] is a 57 year old female(from [place])with T2DM with neuropathy and retinopathy, medical HTN, medical non-compliance,CKDstage IV, admission [facility] 11/17/18for nephrotic syndrome with fluid overload and encephalopathy.She was treated with HD briefly and underwent extensive cardiac, renal and neurologic work up done revealing diffuse CAD, depression, urinary retention due to neurogenic bladder as well as ADEM. She was treated with steroids with minimal recovery neurologically and discharged to [facility] rehab on 10/07/17. She developed diarrhea with weakness, lethargy and leucocytosis and was admitted to [facility] 10/30/17.   She was started on IV rocephin due to concerns of urosepsis. BC and UCS negative but stools positive for C diff. She was started on oral vancomycin for treatment and has had decrease in loose stools. WOC following input on moisture associated skin damageddue to fecal and urinary incontinence and recommended local measures. Her po intake was reported to be poor and multiple supplements were added to help with protein calorie malnutrition. She continued to be limited by diffuse weakness query critical illness myopathy as well as severe debility with encephalopathy.  CIR was  recommended due to functional deficits.    Hospital Course: [place] was admitted to rehab 11/05/2017 for inpatient therapies to consist of PT, ST and OT at least three hours five days a week. Past admission physiatrist, therapy team and rehab RN have worked together to provide customized collaborative inpatient rehab. Her mentation continues to fluctuate between  confusion/confabulation with bouts of clarity. Renal status has been monitored closely as well as serial CBC. She had steady drop in H/H to 6.9 with Hem positive stools and episode of clot with BM per reports. Plavix was discontinued Dr. Havery Moros was consulted for input and recommended full work up. She underwent EGD and colonoscopy on 02/10  revealing duodenal ulcers in duodenal bulb and second part of duodenum without stigmata of bleeding. Colon prep was adequate and showed no evidence of bleeding and one polyp was biopsied. Pathology showed benign adenoma. She was transfused with 2 units PRBC and continues on PPI. H/H has been stable in 10-11 range. No further episodes of bleeding noted.   MRI lumbar and thoracic spine repeated due to ongoing issues with tetraplegia. Lumbar MRI showed increased signal in conus medullaris. Dr. Leonel Ramsay recommended repeating MRI C spine as well as MRI brain. This showed evolutionary changes of multiple foci of T2 signal in deep brain and was negative for new or progressive finding. MRI findings of change in conus felt to be an artifactual. Records were reviewed from [place] and all work up was felt to be appropriate and negative.  Weakness (with decreased reflexes) and encephalopathy felt to be due to demyelination in setting of nephrotic syndrome secondary to diabetic nephropathy. Supportive therapy and vitamin supplementation recommended.    Weights have been monitored closely with serial checks of renal status. She developed acute on chronic renal failure with hyperkalemia and was started on IVF for gentle hydrarion. She was placed on renal diet but started developing anasarca with rising BUN. Dr. Patel/Nephrology was consulted for input and recommended discontinuation of IVF and Lazarus Salines was added to help manage hyperkalemia. He felt worsening of renal status with metabolic acidosis was multifactorial due to poor intake, GIB and increased stool output from  colonoscopy prep. Sodium bicarb was added with resolution of acidosis. Hydralazine was added due to labile blood pressures and  to help manage peripheral edema. She did develop acute respiratory distress due to pulmonary edema on 2/18. She was started on lasix bid with improvement in respiratory symptoms and resolution of hyperkalemia. Valtessa and sodium bicarb were discontinued. She continues to have peripheral edema therefore lasix was changed to bumex and titrated up to 3 mg bid to help manage edema. Dr. Joelyn Oms felt that patient had progressed to CKD stage IV due to acute health issues and recommended follow up with nephrology after discharge.      MASD has been treated with air mattress overlay as well as attempts at toileting.  unstagable injury with slough on sacral area was treated with santyl as well as frequent cleansing to prevent contamination from stool. She has decreased rectal tone with sensory deficits and is incontinent of bowel.  She was started on bowel program with daily suppository but developed diarrhea therefore daily suppositories were discontinued. Fibercon and probiotic were added to help with bulking of stools.  She completed 2 week course of vancomycin on 11/10/17. Foley was discontinued on 2/6 and she continues to require caths 4 to 5 X day due to urinary retention and to keep bladder volumes < 350 cc. She was found to have Enterobacter/Peudomonas UTI and was treated with 3 day course of cipro. Follow up urine culture 2/14 was negative.   Her po intake continues to be poor despite family/friends providing food from home. Nephro has been offered on tid basis in addition to prostat to help with low calorie malnutrition.  She did have syncopal episode on 2/20 with question of transient dyspnea. CXR done showing question of PNA. Pulmonary was consulted for input as patient was afebrile without cough or other pulmonary symptoms.  Dr. Lake Bells recommended monitoring with serial CXR as well  as check of dopplers to rule out DVT due to BLE edema.  Doppler done on 2/21 did reveal acute DVT in right peroneal veins and monitoring was recommended due to recent GIB as well as minimal risk of propagation of below knee DVT.  Repeat CXR 2/22 showed no significant change in BLL opacities with layering pleural effusions. Repeat dopplers BLE on 2/25 was negative for DVT.  She continues to be afebrile without pulmonary symptoms.    Blood sugars were monitored on ac/hs basis. Low dose lantus was added for tighter control but she has had hypoglycemic episodes due to variable intake therefore this was discontinued. Recommend using SSI for elevated BS till intake stabilizes. Her mentation is improving with improvement in attention and some awareness of deficits but this continues to fluctuate later in the day and due to fatigue. She has been making slow progress due limitations from medical issues as well as tetraplegia with cognitive deficits. Her goals were downgraded and she continues to require extensive assistance. Her family is unable to provide care needed and elected on SNF for progressive therapies.  She will continue to receive follow up PT, OT and ST at [place] after discharge.    Rehab course: During patient's stay in rehab weekly team conferences were held to monitor patient's progress, set goals and discuss barriers to discharge. At admission, patient required max assist with ADL tasks and mobility. 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 She required total to max assist with multimodal cues for cognitive tasks. She  has had improvement in activity tolerance, balance and postural control.  She requires  supervision for bathing and UB dressing and max assist for LB dressing at bed level. She requires min assist for bed mobility and supervision for balance at EOB.  She is able to  perform sliding board transfer with mod assist.  She is able to stand in parallel bars with mod assist for one minute with bilateral knees blocked. She requires supervision to propel wheelchair for 50'. .  She is able to recall activity of day with supervisory cues and is able to express basic needs with increased time. She requires mod to max assist for recall and basic problem solving with min to mod cues.     Disposition: Skilled Nursing facility  Diet: Renal diet.   Special Instructions: 1. Monitor weights daily. 2. Cath 4-5 X day to keep bladder volumes < 350cc. 3. Continue to check renal function every 3-5 days and contact nephrology if input needed.  4. Air mattress overlay for pressure relief. Continue santyl with damp to dry dressing to slough on sacral area--change as needed prn soiling.  5. Monitor BS ac/hs and use SSI for elevated BS.   Discharge Instructions    Ambulatory referral to Physical Medicine Rehab   Complete by:  As directed    4 weeks follow up     Allergies as of 12/02/2017      Reactions   Aspirin Hives   Gabapentin    Excessive sedation (slept for 2 days)   Statins Other (See Comments)   Joint pain      Medication List    STOP taking these medications   aluminum-magnesium hydroxide-simethicone 200-200-20 MG/5ML Susp Commonly known as:  MAALOX   clopidogrel 75 MG tablet Commonly known as:  PLAVIX   insulin glargine 100 UNIT/ML injection Commonly known as:  LANTUS   vancomycin 50 mg/mL oral solution Commonly known as:  VANCOCIN     TAKE these medications   acetaminophen 325 MG tablet Commonly known as:  TYLENOL Take 650 mg by mouth every 4 (four) hours as needed for mild pain.   bumetanide 1 MG tablet Commonly known as:  BUMEX Take 3 tablets (3 mg total) by mouth 2 (two) times daily.   collagenase ointment Commonly known as:  SANTYL Apply topically 2 (two) times daily. To sacral area.   ezetimibe 10 MG tablet Commonly known as:   ZETIA Take 10 mg by mouth daily.   feeding supplement (NEPRO CARB STEADY) Liqd Take 237 mLs by mouth 3 (three) times daily with meals. What changed:  when to take this   feeding supplement (PRO-STAT SUGAR FREE 64) Liqd Take 30 mLs by mouth 2 (two) times daily.   Gerhardt's butt cream Crea Apply 1 application topically 4 (four) times daily.   hydrALAZINE 25 MG tablet Commonly known as:  APRESOLINE Take 1 tablet (25 mg total) by mouth every 8 (eight) hours.   insulin aspart 100 UNIT/ML injection Commonly known as:  novoLOG Inject 0-10 Units into the skin See admin instructions. If blood sugar is <70 call MD, 71-250=0, 251-300=2u, 301-350=4,351-400=6u, 401-500=8u, >500=10u and call MD   isosorbide mononitrate 30 MG 24 hr tablet Commonly known as:  IMDUR Take 3 tablets (90 mg total) by mouth daily. Start taking on:  12/03/2017 What changed:    medication strength  how much to take   lip balm Oint Apply 1 application topically as needed for lip care.   metoprolol tartrate 100 MG tablet Commonly known as:  LOPRESSOR Take 1 tablet (100 mg total) by mouth 2 (  two) times daily. What changed:    medication strength  how much to take   mirtazapine 15 MG tablet Commonly known as:  REMERON Take 15 mg by mouth at bedtime.   multivitamin Tabs tablet Take 1 tablet by mouth at bedtime.   NIFEdipine 60 MG 24 hr tablet Commonly known as:  PROCARDIA-XL/ADALAT CC Take 1 tablet (60 mg total) by mouth 2 (two) times daily. What changed:    medication strength  how much to take   pantoprazole 40 MG tablet Commonly known as:  PROTONIX Take 1 tablet (40 mg total) by mouth 2 (two) times daily before a meal. What changed:  when to take this   polycarbophil 625 MG tablet Commonly known as:  FIBERCON Take 2 tablets (1,250 mg total) by mouth 2 (two) times daily before lunch and supper.   traZODone 50 MG tablet Commonly known as:  DESYREL Take 0.5-1 tablets (25-50 mg total) by mouth  at bedtime as needed for sleep.   venlafaxine XR 150 MG 24 hr capsule Commonly known as:  EFFEXOR-XR Take 150 mg by mouth daily with breakfast. What changed:  Another medication with the same name was added. Make sure you understand how and when to take each.   venlafaxine XR 150 MG 24 hr capsule Commonly known as:  EFFEXOR-XR Take 1 capsule (150 mg total) by mouth daily with breakfast. Start taking on:  12/03/2017 What changed:  You were already taking a medication with the same name, and this prescription was added. Make sure you understand how and when to take each.       Contact information for follow-up providers    [name], MD Follow up.   Specialty:  Physical Medicine and Rehabilitation Why:  office will call you with follow up appointment Contact information: [address] [place] [address] [phone]        Yetta Flock, MD. Call.   Specialty:  Gastroenterology Why:  as needed for GI issues Contact information: [place] [address] [phone]        Rexene Agent, MD Follow up on 12/28/2017.   Specialty:  Nephrology Why:  Be there at 3:15 for follow up appointment Contact information: [place] [phone] [phone]        Denton Brick., MD. Call in 1 day(s).   Specialty:  Cardiology Why:  for follow up on cardiac issues and input on resuming Plavix.  Contact information: [address] [place] [address] [phone]            Contact information for after-discharge care    Destination    [facility] Follow up.   Service:  Skilled Nursing Contact information: [address] [place] [phone]                  Signed: Bary Leriche 12/02/2017, 10:05 AM

## 2017-12-02 ENCOUNTER — Inpatient Hospital Stay (HOSPITAL_COMMUNITY): Payer: No Typology Code available for payment source | Admitting: Speech Pathology

## 2017-12-02 ENCOUNTER — Inpatient Hospital Stay (HOSPITAL_COMMUNITY): Payer: No Typology Code available for payment source | Admitting: Occupational Therapy

## 2017-12-02 DIAGNOSIS — G0481 Other encephalitis and encephalomyelitis: Secondary | ICD-10-CM

## 2017-12-02 DIAGNOSIS — R7309 Other abnormal glucose: Secondary | ICD-10-CM

## 2017-12-02 DIAGNOSIS — R0989 Other specified symptoms and signs involving the circulatory and respiratory systems: Secondary | ICD-10-CM | POA: Insufficient documentation

## 2017-12-02 LAB — CBC
HEMATOCRIT: 34.8 % — AB (ref 36.0–46.0)
HEMOGLOBIN: 11.3 g/dL — AB (ref 12.0–15.0)
MCH: 28.4 pg (ref 26.0–34.0)
MCHC: 32.5 g/dL (ref 30.0–36.0)
MCV: 87.4 fL (ref 78.0–100.0)
Platelets: 314 10*3/uL (ref 150–400)
RBC: 3.98 MIL/uL (ref 3.87–5.11)
RDW: 16.8 % — ABNORMAL HIGH (ref 11.5–15.5)
WBC: 9.6 10*3/uL (ref 4.0–10.5)

## 2017-12-02 LAB — GLUCOSE, CAPILLARY
GLUCOSE-CAPILLARY: 169 mg/dL — AB (ref 65–99)
Glucose-Capillary: 103 mg/dL — ABNORMAL HIGH (ref 65–99)

## 2017-12-02 LAB — RENAL FUNCTION PANEL
ANION GAP: 12 (ref 5–15)
Albumin: 2.4 g/dL — ABNORMAL LOW (ref 3.5–5.0)
BUN: 90 mg/dL — ABNORMAL HIGH (ref 6–20)
CHLORIDE: 107 mmol/L (ref 101–111)
CO2: 23 mmol/L (ref 22–32)
Calcium: 8.6 mg/dL — ABNORMAL LOW (ref 8.9–10.3)
Creatinine, Ser: 3.18 mg/dL — ABNORMAL HIGH (ref 0.44–1.00)
GFR calc Af Amer: 18 mL/min — ABNORMAL LOW (ref >60)
GFR, EST NON AFRICAN AMERICAN: 15 mL/min — AB (ref >60)
Glucose, Bld: 109 mg/dL — ABNORMAL HIGH (ref 65–99)
POTASSIUM: 3.5 mmol/L (ref 3.5–5.1)
Phosphorus: 4.6 mg/dL (ref 2.5–4.6)
Sodium: 142 mmol/L (ref 135–145)

## 2017-12-02 MED ORDER — NIFEDIPINE ER 60 MG PO TB24: 60.0000 mg | ORAL_TABLET | Freq: Two times a day (BID) | ORAL | Status: AC

## 2017-12-02 MED ORDER — BLISTEX MEDICATED EX OINT: 1.0000 "application " | TOPICAL_OINTMENT | CUTANEOUS | Status: AC | PRN

## 2017-12-02 MED ORDER — HYDRALAZINE HCL 25 MG PO TABS: 25.0000 mg | ORAL_TABLET | Freq: Three times a day (TID) | ORAL | Status: AC

## 2017-12-02 MED ORDER — NEPRO/CARBSTEADY PO LIQD: 237.0000 mL | Freq: Three times a day (TID) | ORAL | 0 refills | Status: AC

## 2017-12-02 MED ORDER — VENLAFAXINE HCL ER 150 MG PO CP24: 150.0000 mg | ORAL_CAPSULE | Freq: Every day | ORAL | Status: AC

## 2017-12-02 MED ORDER — RENA-VITE PO TABS: 1.0000 | ORAL_TABLET | Freq: Every day | ORAL | 0 refills | Status: AC

## 2017-12-02 MED ORDER — CALCIUM POLYCARBOPHIL 625 MG PO TABS: 1250.0000 mg | ORAL_TABLET | Freq: Two times a day (BID) | ORAL | Status: AC

## 2017-12-02 MED ORDER — COLLAGENASE 250 UNIT/GM EX OINT: TOPICAL_OINTMENT | Freq: Two times a day (BID) | CUTANEOUS | 0 refills | Status: DC

## 2017-12-02 MED ORDER — METOPROLOL TARTRATE 100 MG PO TABS: 100.0000 mg | ORAL_TABLET | Freq: Two times a day (BID) | ORAL | Status: AC

## 2017-12-02 MED ORDER — BUMETANIDE 1 MG PO TABS: 3.0000 mg | ORAL_TABLET | Freq: Two times a day (BID) | ORAL | Status: AC

## 2017-12-02 MED ORDER — TRAZODONE HCL 50 MG PO TABS: 25.0000 mg | ORAL_TABLET | Freq: Every evening | ORAL | Status: AC | PRN

## 2017-12-02 MED ORDER — COLLAGENASE 250 UNIT/GM EX OINT: TOPICAL_OINTMENT | Freq: Two times a day (BID) | CUTANEOUS | 0 refills | Status: AC

## 2017-12-02 MED ORDER — ISOSORBIDE MONONITRATE ER 30 MG PO TB24: 90.0000 mg | ORAL_TABLET | Freq: Every day | ORAL | Status: AC

## 2017-12-02 MED ORDER — PANTOPRAZOLE SODIUM 40 MG PO TBEC: 40.0000 mg | DELAYED_RELEASE_TABLET | Freq: Two times a day (BID) | ORAL | Status: AC

## 2017-12-02 NOTE — Progress Notes (Signed)
Occupational Therapy Discharge Summary  Patient Details  Name: Laura [name] MRN: [id number] Date of Birth: 1961/04/10  Patient has met 6 of 10 long term goals [name] to improved activity tolerance, improved balance, postural control and improved attention.  Patient to discharge at [facility] Max Assist level for transfers and LB dressing, min assist for sitting balance, UB dressing and grooming tasks.  Patient's care partner unavailable to provide the necessary physical and cognitive assistance at discharge.    Reasons goals not met: Pt continues to require mod assist for bathing, max assist LB dressing, total assist toileting, and max assist toilet transfer. Pt continues to require max assist for transfers with slide board and is currently still incontinent of bowel and requiring I/O caths for urine therefore still total assist for toileting [name] to decreased episodes of toileting.    Recommendation:  Patient will benefit from ongoing skilled OT services in skilled nursing facility setting to continue to advance functional skills in the area of BADL and Reduce care partner burden.  Equipment: No equipment provided  Reasons for discharge: treatment goals met and discharge from hospital  Patient/family agrees with progress made and goals achieved: Yes   See Function Navigator for Current Functional Status.  [name] 12/02/2017, 3:50 PM

## 2017-12-02 NOTE — Progress Notes (Signed)
Subjective/Complaints: Patient seen lying in bed this morning. She states she slept well overnight. She tells me she is going to another facility today. She asked me to pray for her recovery.  ROS: Denies CP, SOB, nausea, vomiting, diarrhea.  Objective: Vital Signs: Blood pressure (!) 154/78, pulse 91, temperature 98.1 F (36.7 C), temperature source Oral, resp. rate 18, height _0  (1.499 m), weight 50.8 kg (112 lb), SpO2 96 %. No results found. Results for orders placed or performed during the hospital encounter of 11/05/17 (from the past 72 hour(s))  Glucose, capillary     Status: Abnormal   Collection Time: 11/29/17 11:57 AM  Result Value Ref Range   Glucose-Capillary 130 (H) 65 - 99 mg/dL  Glucose, capillary     Status: Abnormal   Collection Time: 11/29/17  1:12 PM  Result Value Ref Range   Glucose-Capillary 134 (H) 65 - 99 mg/dL  Glucose, capillary     Status: Abnormal   Collection Time: 11/29/17  4:48 PM  Result Value Ref Range   Glucose-Capillary 109 (H) 65 - 99 mg/dL  Glucose, capillary     Status: Abnormal   Collection Time: 11/29/17  8:55 PM  Result Value Ref Range   Glucose-Capillary 202 (H) 65 - 99 mg/dL  Glucose, capillary     Status: Abnormal   Collection Time: 11/30/17  6:35 AM  Result Value Ref Range   Glucose-Capillary 114 (H) 65 - 99 mg/dL  Renal function panel     Status: Abnormal   Collection Time: 11/30/17  7:58 AM  Result Value Ref Range   Sodium 142 135 - 145 mmol/L   Potassium 3.9 3.5 - 5.1 mmol/L   Chloride 109 101 - 111 mmol/L   CO2 21 (L) 22 - 32 mmol/L   Glucose, Bld 104 (H) 65 - 99 mg/dL   BUN 87 (H) 6 - 20 mg/dL   Creatinine, Ser 2.90 (H) 0.44 - 1.00 mg/dL   Calcium 8.6 (L) 8.9 - 10.3 mg/dL   Phosphorus 4.9 (H) 2.5 - 4.6 mg/dL   Albumin 2.2 (L) 3.5 - 5.0 g/dL   GFR calc non Af Amer 17 (L) >60 mL/min   GFR calc Af Amer 20 (L) >60 mL/min    Comment: (NOTE) The eGFR has been calculated using the CKD EPI equation. This calculation has not  been validated in all clinical situations. eGFR's persistently <60 mL/min signify possible Chronic Kidney Disease.    Anion gap 12 5 - 15    Comment: Performed at [facility] [address] [place], [place] [address]  Glucose, capillary     Status: Abnormal   Collection Time: 11/30/17 12:05 PM  Result Value Ref Range   Glucose-Capillary 135 (H) 65 - 99 mg/dL  Glucose, capillary     Status: Abnormal   Collection Time: 11/30/17  4:41 PM  Result Value Ref Range   Glucose-Capillary 189 (H) 65 - 99 mg/dL  Glucose, capillary     Status: Abnormal   Collection Time: 11/30/17  9:27 PM  Result Value Ref Range   Glucose-Capillary 214 (H) 65 - 99 mg/dL  Glucose, capillary     Status: Abnormal   Collection Time: 12/01/17  6:29 AM  Result Value Ref Range   Glucose-Capillary 102 (H) 65 - 99 mg/dL  Renal function panel     Status: Abnormal   Collection Time: 12/01/17  6:47 AM  Result Value Ref Range   Sodium 140 135 - 145 mmol/L   Potassium 3.5  3.5 - 5.1 mmol/L   Chloride 107 101 - 111 mmol/L   CO2 24 22 - 32 mmol/L   Glucose, Bld 105 (H) 65 - 99 mg/dL   BUN 88 (H) 6 - 20 mg/dL   Creatinine, Ser 3.17 (H) 0.44 - 1.00 mg/dL   Calcium 8.3 (L) 8.9 - 10.3 mg/dL   Phosphorus 4.7 (H) 2.5 - 4.6 mg/dL   Albumin 2.2 (L) 3.5 - 5.0 g/dL   GFR calc non Af Amer 15 (L) >60 mL/min   GFR calc Af Amer 18 (L) >60 mL/min    Comment: (NOTE) The eGFR has been calculated using the CKD EPI equation. This calculation has not been validated in all clinical situations. eGFR's persistently <60 mL/min signify possible Chronic Kidney Disease.    Anion gap 9 5 - 15    Comment: Performed at [facility] [address], [place], [place] [address]  Glucose, capillary     Status: Abnormal   Collection Time: 12/01/17 11:56 AM  Result Value Ref Range   Glucose-Capillary 123 (H) 65 - 99 mg/dL  Glucose, capillary     Status: Abnormal   Collection Time: 12/01/17  5:10 PM  Result Value Ref Range    Glucose-Capillary 125 (H) 65 - 99 mg/dL  Glucose, capillary     Status: Abnormal   Collection Time: 12/01/17  9:12 PM  Result Value Ref Range   Glucose-Capillary 187 (H) 65 - 99 mg/dL  Glucose, capillary     Status: Abnormal   Collection Time: 12/02/17  6:59 AM  Result Value Ref Range   Glucose-Capillary 103 (H) 65 - 99 mg/dL   Comment 1 Notify RN   Renal function panel     Status: Abnormal   Collection Time: 12/02/17  7:27 AM  Result Value Ref Range   Sodium 142 135 - 145 mmol/L   Potassium 3.5 3.5 - 5.1 mmol/L   Chloride 107 101 - 111 mmol/L   CO2 23 22 - 32 mmol/L   Glucose, Bld 109 (H) 65 - 99 mg/dL   BUN 90 (H) 6 - 20 mg/dL   Creatinine, Ser 3.18 (H) 0.44 - 1.00 mg/dL   Calcium 8.6 (L) 8.9 - 10.3 mg/dL   Phosphorus 4.6 2.5 - 4.6 mg/dL   Albumin 2.4 (L) 3.5 - 5.0 g/dL   GFR calc non Af Amer 15 (L) >60 mL/min   GFR calc Af Amer 18 (L) >60 mL/min    Comment: (NOTE) The eGFR has been calculated using the CKD EPI equation. This calculation has not been validated in all clinical situations. eGFR's persistently <60 mL/min signify possible Chronic Kidney Disease.    Anion gap 12 5 - 15    Comment: Performed at [facility] [address] [place], [place] [address]  CBC     Status: Abnormal   Collection Time: 12/02/17  7:27 AM  Result Value Ref Range   WBC 9.6 4.0 - 10.5 K/uL   RBC 3.98 3.87 - 5.11 MIL/uL   Hemoglobin 11.3 (L) 12.0 - 15.0 g/dL   HCT 34.8 (L) 36.0 - 46.0 %   MCV 87.4 78.0 - 100.0 fL   MCH 28.4 26.0 - 34.0 pg   MCHC 32.5 30.0 - 36.0 g/dL   RDW 16.8 (H) 11.5 - 15.5 %   Platelets 314 150 - 400 K/uL    Comment: Performed at [facility], [place] [address], [place], [place] [address]    General: NAD. Vital signs reviewed.  HEENT: Normocephalic, atraumatic.  Cardio: RRR. No JVD. Resp: CTA B/L and unlabored. GI: BS positive and ND Musc/Skel:  No tenderness. Lower extremity edema, unchanged. Neuro: Alert and oriented 3 with increased time  (unchanged) Motor:  LUE: 5/5 proximal to distal RUE: 4+/5 proximal to distal  RLE: HF: 2+/5, ADF/PF 2/5 (slowly improving) LLE: HF 3-/5, ADF/PF 2+/5 (slowly improving) Skin: Intact. Warm and dry.  Psych: Flat, tangential.   Assessment/Plan: 1. Functional deficits secondary to debility and encephalopathy which require 3+ hours per day of interdisciplinary therapy in a comprehensive inpatient rehab setting. Physiatrist is providing close team supervision and 24 hour management of active medical problems listed below. Physiatrist and rehab team continue to assess barriers to discharge/monitor patient progress toward functional and medical goals. FIM: Function - Bathing Position: Shower Body parts bathed by patient: Right arm, Left arm, Chest, Abdomen, Right upper leg, Left upper leg Body parts bathed by helper: Front perineal area, Buttocks, Right lower leg, Left lower leg, Back Bathing not applicable: Left upper leg, Left lower leg, Right lower leg, Right upper leg Assist Level: (Mod assist)  Function- Upper Body Dressing/Undressing What is the patient wearing?: Pull over shirt/dress Bra - Perfomed by patient: Thread/unthread left bra strap Bra - Perfomed by helper: Thread/unthread right bra strap, Hook/unhook bra (pull down sports bra), Thread/unthread left bra strap Pull over shirt/dress - Perfomed by patient: Thread/unthread right sleeve, Thread/unthread left sleeve, Put head through opening, Pull shirt over trunk Pull over shirt/dress - Perfomed by helper: Pull shirt over trunk Assist Level: Supervision or verbal cues, Set up Set up : To obtain clothing/put away Function - Lower Body Dressing/Undressing What is the patient wearing?: Pants, Non-skid slipper socks Position: Bed Underwear - Performed by helper: Thread/unthread right underwear leg, Thread/unthread left underwear leg Pants- Performed by patient: Pull pants up/down Pants- Performed by helper: Thread/unthread left pants  leg, Thread/unthread right pants leg, Pull pants up/down Non-skid slipper socks- Performed by helper: Don/doff right sock, Don/doff left sock Assist for footwear: Maximal assist Assist for lower body dressing: More than reasonable time, Touching or steadying assistance (Pt > 75%)(Total assist) Set up : To obtain clothing/put away  Function - Toileting Toileting activity did not occur: No continent bowel/bladder event Toileting steps completed by helper: Adjust clothing prior to toileting, Performs perineal hygiene, Adjust clothing after toileting Assist level: Two helpers  Function - Air cabin crew transfer activity did not occur: Safety/medical concerns Toilet transfer assistive device: Drop arm commode, Sliding board, Bedside commode Assist level to toilet: 2 helpers(per [place], NT report) Assist level to bedside commode (at bedside): Total assist (Pt < 25%) Assist level from bedside commode (at bedside): Total assist (Pt < 25%)  Function - Chair/bed transfer Chair/bed transfer method: Lateral scoot Chair/bed transfer assist level: Maximal assist (Pt 25 - 49%/lift and lower) Chair/bed transfer assistive device: Sliding board, Armrests Chair/bed transfer details: Manual facilitation for placement, Manual facilitation for weight shifting, Verbal cues for technique  Function - Locomotion: Wheelchair Will patient use wheelchair at discharge?: Yes Type: Manual Max wheelchair distance: 107' Assist Level: Supervision or verbal cues Assist Level: Supervision or verbal cues Wheel 150 feet activity did not occur: Safety/medical concerns Assist Level: Touching or steadying assistance (Pt > 75%) Turns around,maneuvers to table,bed, and toilet,negotiates 3% grade,maneuvers on rugs and over doorsills: No Function - Locomotion: Ambulation Ambulation activity did not occur: Safety/medical concerns Walk 10 feet activity did not occur: Safety/medical concerns Walk 50 feet with 2  turns activity did not  occur: Safety/medical concerns Walk 150 feet activity did not occur: Safety/medical concerns Walk 10 feet on uneven surfaces activity did not occur: Safety/medical concerns  Function - Comprehension Comprehension: Auditory Comprehension assist level: Understands basic 90% of the time/cues < 10% of the time  Function - Expression Expression: Verbal Expression assist level: Expresses basic needs/ideas: With extra time/assistive device  Function - Social Interaction Social Interaction assist level: Interacts appropriately 75 - 89% of the time - Needs redirection for appropriate language or to initiate interaction.  Function - Problem Solving Problem solving assist level: Solves basic 50 - 74% of the time/requires cueing 25 - 49% of the time  Function - Memory Memory assist level: Recognizes or recalls 50 - 74% of the time/requires cueing 25 - 49% of the time Patient normally able to recall (first 3 days only): That he or she is in a hospital, Current season  Medical Problem List and Plan: 1.Functional and mobility deficitssecondary to debility and encephalopathy from multiple medical issues   DC today   Will see patient in one month for post hospital follow-up   Repeat MRI brain and T-spine suggesting stable/improvement. Neurology signed off. Head CT reviewed, no acute intracranial abnormalities. 2. DVT Prophylaxis/Anticoagulation: Pharmaceutical:Lovenox   Right peroneal DVT on Dopplers, but repeat dopplers neg for DVT. 3. Pain Management:tylenol prn 4. Mood:LCSW to follow for evaluation and support.   team to continue to provide support    xanax prn for anxiety 5. Neuropsych: This patientis not fullycapable of making decisions onherown behalf. 6. Skin/Wound Care:Air mattress for pressure relief measures. Local measures to help managesacral /labialMASD PRAFO's for bilateral LE's 7. Fluids/Electrolytes/Nutrition:Monitor I/O.     Continue to offer supplements to help with protein calorie malnutrition.   Cont Remeron 15 qhs 8. C diff colitis: Resolved   Vancomycincompleted 2/6 Continue enteric precautions  Repeat C diff PCR + but toxin negative.     Appreciate ID recs, no plans to treat at present 9. HTN: Monitor BP bid.    Nifedipine bid increased to 60 on 2/6   Imdur increased to 90 on 2/4   Metoprolol increased to 100 BID on 2/3   Hydralazine 10 3 times a day started on 2/13, increased to 25 on 2/14   Labile day to day, will need close ambulatory monitoring Vitals:   12/01/17 2116 12/02/17 0551  BP: (!) 147/83 (!) 154/78  Pulse: 96 91  Resp:  18  Temp:  98.1 F (36.7 C)  SpO2:  96%  10 T2DM with retinopathy, neuropathy, andnephropathy:Monitor BS ac/hs.    SSI for elevated BS CBG (last 3)  Recent Labs    12/01/17 1710 12/01/17 2112 12/02/17 0659  GLUCAP 125* 187* 103*    Labile on 2/28 11. H/o depression: Continue Effexor and Remeron.  12. Neurogenic bladder:Has chronic indwelling catheter for 2+ months.   Foley out, I/O caths, keep strict I/Os.  13.AKI onCKD with history of overload:    Cr 3.18 on 2/28, continues to trend up. Will be closing ambulatory monitoring with nephro follow-up   Unable to tolerate IVF due to fluid overload   No recs per Nephro, signed off   Encourage fluids   Appreciate nephrology recs   Cont to monitor 14. Diffuse CAD: treated medically with Metoprolol, Zetia and Plavix. 15.  ABLA on anemia of chronic disease:    Stool OB now neg   Hb 11.3 on 2/28   Appreciate GI recs, EGD/colonoscopy performed on 2/10 showing a total of 5 duodenal ulcers,  which are presumed to be a source of bleeding.  16. Leukocytosis: Resolved   Afebrile   Repeat UA relatively unremarkable for infection, Ucx multi species  17. Hypoalbuminemia   Cont supplement 18. Hyperkalemia: Resolved   Cont to monitor    K+ 40 daily x2 days due to lasix, changed to Bumex on  2/22, increased on 2/27   Will need close ambulatory monitoring as outpatient as patient is borderline 19. Acute lower UTI   UA is equivocal, urine culture with Pseudomonas   Cipro completed 2/11-2/13 20.  GI bleed   Believed to be secondary to duodenal ulcers   Appreciate GI recs, 40 mg Protonix twice daily    H. pylori serology negative   Polyp biopsy on colonoscopy results to be sent patient for GI 21. Dyspnea: Resolved    Continue Lasix  CXR reviewed, suggesting fluid overload 22. Syncope: Resolved  Appreciate teaching service recs, likely vasovagal, signed off   Additional IV dose lasix given on 2/20   ECG/CE reviewed, no significant acute concerns.  Cardiac enzymes negative, ECG  Repeat CXR reviewed, showing opacities. Appreciate pulmonary recs suggesting likely fluid overload and less likely pneumonia.   23. Pulmonary edema   Continue Bumex   Chest x-ray reviewed, showing persistent edema 24.  Fall on 2/25   Reminded patient of safety   Cont Tele-sitter   No apparent head trauma and fall from low bed on bottom    LOS (Days) 27 A FACE TO FACE EVALUATION WAS PERFORMED  Ankit Lorie Phenix 12/02/2017, 9:01 AM

## 2017-12-02 NOTE — Progress Notes (Signed)
Report called to [employer], Therapist, sports at [employer]  All questions answered to his satisfaction.  Patient left floor via stretcher, escorted by [facility] staff.  Patients husband present to transport personal items.  All patient belongings sent with patient.  Patient appears to be in no immediate distress at this time.  Brita Romp, RN

## 2017-12-02 NOTE — Progress Notes (Signed)
Speech Language Pathology Discharge Summary  Patient Details  Name: Laura [name] MRN: [id number] Date of Birth: 1961-01-27    Patient has met 6 of 7 long term goals.  Patient to discharge at overall Mod;Min level.   Clinical Impression/Discharge Summary:   Pt has made functional gains while inpatient and is discharging having met 6 out of 7 short term goals, although goals were downgraded last week to accommodate slow pt progress.  Pt is currently min-mod assist for basic tasks due to moderate cognitive impairment  Pt and family education is complete for this level of care; however, pt and her husband will need ongoing training at SNF prior to discharge home.  Pt has demonstrated improved orientation, use of environmental aids for recall of information, and attention to tasks.  Pt is discharging to SNF with recommendations for ST follow up at next level of care to address cognition.     Care Partner:  Caregiver Able to Provide Assistance: (n/a)  Type of Caregiver Assistance: (n/a)  Recommendation:  Skilled Nursing facility  Rationale for SLP Follow Up: Maximize cognitive function and independence;Reduce caregiver burden   Equipment: none recommended by SLP    Reasons for discharge: Discharged from hospital   Patient/Family Agrees with Progress Made and Goals Achieved: Yes     Laura [name], Selinda Orion 12/02/2017, 8:09 AM

## 2017-12-02 NOTE — Plan of Care (Signed)
  Not Met (add Reason) RH BLADDER ELIMINATION RH STG MANAGE BLADDER WITH ASSISTANCE Description STG Manage Bladder With Assistance mod  12/02/2017 1140 - Not Met (add Reason) by Brita Romp, RN Note Requires intermittent caths for bladder emptying RH STG MANAGE BLADDER WITH EQUIPMENT WITH ASSISTANCE Description STG Manage Bladder With Equipment With mod Assistance  12/02/2017 1140 - Not Met (add Reason) by Brita Romp, RN RH SKIN INTEGRITY RH STG SKIN FREE OF INFECTION/BREAKDOWN Description No new skin breakdown/infection with mod assistance  12/02/2017 1140 - Not Met (add Reason) by Brita Romp, RN Note Unstageable to sacrum requiring max assist. RH STG MAINTAIN SKIN INTEGRITY WITH ASSISTANCE Description STG Maintain Skin Integrity With mod Assistance.  12/02/2017 1140 - Not Met (add Reason) by Brita Romp, RN RH STG ABLE TO PERFORM INCISION/WOUND CARE W/ASSISTANCE Description STG Able To Perform Incision/Wound Care With mod Assistance.  12/02/2017 1140 - Not Met (add Reason) by Brita Romp, RN RH SAFETY RH STG DECREASED RISK OF FALL WITH ASSISTANCE Description STG Decreased Risk of Fall With supervision Assistance.  12/02/2017 1140 - Not Met (add Reason) by Brita Romp, RN   Completed/Met Consults [facility] GENERAL PATIENT EDUCATION Description See Patient Education module for education specifics. 12/02/2017 1140 - Completed/Met by Brita Romp, RN Skin Care Protocol Initiated - if Braden Score 18 or less Description If consults are not indicated, leave blank or document N/A 12/02/2017 1140 - Completed/Met by Brita Romp, RN Nutrition Consult-if indicated 12/02/2017 1140 - Completed/Met by Brita Romp, RN Diabetes Guidelines if Diabetic/Glucose > 140 Description If diabetic or lab glucose is > 140 mg/dl - Initiate Diabetes/Hyperglycemia Guidelines & Document Interventions  12/02/2017 1140 - Completed/Met by Brita Romp, RN RH BLADDER ELIMINATION RH STG MANAGE BLADDER WITH MEDICATION WITH ASSISTANCE Description STG Manage Bladder With Medication With Assistance. mod  12/02/2017 1140 - Completed/Met by Brita Romp, RN RH SAFETY RH STG ADHERE TO SAFETY PRECAUTIONS W/ASSISTANCE/DEVICE Description STG Adhere to Safety Precautions With supervision Assistance/Device.  12/02/2017 1140 - Completed/Met by Brita Romp, RN   Not Applicable RH SAFETY RH STG DEMO UNDERSTANDING HOME SAFETY PRECAUTIONS Description Verbalize home safety precaution with min assistance  [id number] - Not Applicable by Brita Romp, RN

## 2017-12-03 ENCOUNTER — Inpatient Hospital Stay (HOSPITAL_COMMUNITY): Payer: No Typology Code available for payment source | Admitting: Physical Therapy

## 2017-12-03 NOTE — Progress Notes (Signed)
Social Work  Discharge Note  The overall goal for the admission was met for:   Discharge location: No - plan changed to SNF due to extent of pt's care needs and family unable to provide  Length of Stay: Yes - 27 days  Discharge activity level: No  Home/community participation: No  Services provided included: MD, RD, PT, OT, SLP, RN, TR, Pharmacy, [facility]: Private Insurance: [place] with [employer]  Follow-up services arranged: Other: SNF at [place] (or additional information):  Patient/Family verbalized understanding of follow-up arrangements: Yes  Individual responsible for coordination of the follow-up plan: pt/spouse  Confirmed correct DME delivered: NA    Verlia Kaney

## 2017-12-15 ENCOUNTER — Encounter (HOSPITAL_COMMUNITY): Payer: Self-pay | Admitting: Emergency Medicine

## 2017-12-15 ENCOUNTER — Emergency Department (HOSPITAL_COMMUNITY)
Admission: EM | Admit: 2017-12-15 | Discharge: 2017-12-16 | Disposition: A | Discharge status: Home / Self Care | Payer: No Typology Code available for payment source | Attending: Physician Assistant | Admitting: Physician Assistant

## 2017-12-15 DIAGNOSIS — I129 Hypertensive chronic kidney disease with stage 1 through stage 4 chronic kidney disease, or unspecified chronic kidney disease: Secondary | ICD-10-CM | POA: Insufficient documentation

## 2017-12-15 DIAGNOSIS — E1140 Type 2 diabetes mellitus with diabetic neuropathy, unspecified: Secondary | ICD-10-CM | POA: Insufficient documentation

## 2017-12-15 DIAGNOSIS — R4587 Impulsiveness: Secondary | ICD-10-CM | POA: Diagnosis not present

## 2017-12-15 DIAGNOSIS — Z794 Long term (current) use of insulin: Secondary | ICD-10-CM | POA: Diagnosis not present

## 2017-12-15 DIAGNOSIS — N184 Chronic kidney disease, stage 4 (severe): Secondary | ICD-10-CM | POA: Diagnosis not present

## 2017-12-15 DIAGNOSIS — R45851 Suicidal ideations: Secondary | ICD-10-CM | POA: Insufficient documentation

## 2017-12-15 DIAGNOSIS — F322 Major depressive disorder, single episode, severe without psychotic features: Secondary | ICD-10-CM | POA: Insufficient documentation

## 2017-12-15 DIAGNOSIS — Z79899 Other long term (current) drug therapy: Secondary | ICD-10-CM | POA: Insufficient documentation

## 2017-12-15 DIAGNOSIS — E1122 Type 2 diabetes mellitus with diabetic chronic kidney disease: Secondary | ICD-10-CM | POA: Insufficient documentation

## 2017-12-15 DIAGNOSIS — I2510 Atherosclerotic heart disease of native coronary artery without angina pectoris: Secondary | ICD-10-CM | POA: Insufficient documentation

## 2017-12-15 DIAGNOSIS — F4321 Adjustment disorder with depressed mood: Secondary | ICD-10-CM | POA: Diagnosis present

## 2017-12-15 LAB — COMPREHENSIVE METABOLIC PANEL
ALBUMIN: 2.5 g/dL — AB (ref 3.5–5.0)
ALK PHOS: 134 U/L — AB (ref 38–126)
ALT: 16 U/L (ref 14–54)
AST: 20 U/L (ref 15–41)
Anion gap: 12 (ref 5–15)
BILIRUBIN TOTAL: 0.3 mg/dL (ref 0.3–1.2)
BUN: 98 mg/dL — AB (ref 6–20)
CO2: 22 mmol/L (ref 22–32)
Calcium: 8.6 mg/dL — ABNORMAL LOW (ref 8.9–10.3)
Chloride: 105 mmol/L (ref 101–111)
Creatinine, Ser: 3.94 mg/dL — ABNORMAL HIGH (ref 0.44–1.00)
GFR calc Af Amer: 14 mL/min — ABNORMAL LOW (ref >60)
GFR calc non Af Amer: 12 mL/min — ABNORMAL LOW (ref >60)
GLUCOSE: 100 mg/dL — AB (ref 65–99)
Potassium: 4.2 mmol/L (ref 3.5–5.1)
Sodium: 139 mmol/L (ref 135–145)
TOTAL PROTEIN: 6.2 g/dL — AB (ref 6.5–8.1)

## 2017-12-15 LAB — CBC WITH DIFFERENTIAL/PLATELET
BASOS PCT: 0 %
Basophils Absolute: 0 10*3/uL (ref 0.0–0.1)
Eosinophils Absolute: 0.2 10*3/uL (ref 0.0–0.7)
Eosinophils Relative: 2 %
HEMATOCRIT: 30.4 % — AB (ref 36.0–46.0)
HEMOGLOBIN: 10.1 g/dL — AB (ref 12.0–15.0)
Lymphocytes Relative: 27 %
Lymphs Abs: 2.6 10*3/uL (ref 0.7–4.0)
MCH: 28.6 pg (ref 26.0–34.0)
MCHC: 33.2 g/dL (ref 30.0–36.0)
MCV: 86.1 fL (ref 78.0–100.0)
MONOS PCT: 7 %
Monocytes Absolute: 0.6 10*3/uL (ref 0.1–1.0)
NEUTROS ABS: 6.2 10*3/uL (ref 1.7–7.7)
NEUTROS PCT: 64 %
Platelets: 377 10*3/uL (ref 150–400)
RBC: 3.53 MIL/uL — ABNORMAL LOW (ref 3.87–5.11)
RDW: 15.5 % (ref 11.5–15.5)
WBC: 9.6 10*3/uL (ref 4.0–10.5)

## 2017-12-15 LAB — CBG MONITORING, ED
Glucose-Capillary: 68 mg/dL (ref 65–99)
Glucose-Capillary: 90 mg/dL (ref 65–99)

## 2017-12-15 MED ORDER — TRAZODONE HCL 50 MG PO TABS
25.0000 mg | ORAL_TABLET | Freq: Every evening | ORAL | Status: DC | PRN
  Administered 2017-12-16: 50 mg via ORAL
  Filled 2017-12-15: qty 1

## 2017-12-15 MED ORDER — PANTOPRAZOLE SODIUM 40 MG PO TBEC
40.0000 mg | DELAYED_RELEASE_TABLET | Freq: Two times a day (BID) | ORAL | Status: DC
  Administered 2017-12-15 – 2017-12-16 (×2): 40 mg via ORAL
  Filled 2017-12-15 (×2): qty 1

## 2017-12-15 MED ORDER — BUMETANIDE 1 MG PO TABS
3.0000 mg | ORAL_TABLET | Freq: Two times a day (BID) | ORAL | Status: DC
  Administered 2017-12-16: 3 mg via ORAL
  Filled 2017-12-15 (×2): qty 3

## 2017-12-15 MED ORDER — MIRTAZAPINE 30 MG PO TABS
15.0000 mg | ORAL_TABLET | Freq: Every day | ORAL | Status: DC
  Administered 2017-12-15: 15 mg via ORAL
  Filled 2017-12-15: qty 1

## 2017-12-15 MED ORDER — NIFEDIPINE ER 60 MG PO TB24
60.0000 mg | ORAL_TABLET | Freq: Two times a day (BID) | ORAL | Status: DC
  Administered 2017-12-16 (×2): 60 mg via ORAL
  Filled 2017-12-15 (×2): qty 1

## 2017-12-15 MED ORDER — ISOSORBIDE MONONITRATE ER 60 MG PO TB24
90.0000 mg | ORAL_TABLET | Freq: Every day | ORAL | Status: DC
  Administered 2017-12-16: 10:00:00 90 mg via ORAL
  Filled 2017-12-15: qty 1

## 2017-12-15 MED ORDER — EZETIMIBE 10 MG PO TABS
10.0000 mg | ORAL_TABLET | Freq: Every day | ORAL | Status: DC
  Administered 2017-12-16: 10 mg via ORAL
  Filled 2017-12-15: qty 1

## 2017-12-15 MED ORDER — VENLAFAXINE HCL ER 75 MG PO CP24
150.0000 mg | ORAL_CAPSULE | Freq: Every day | ORAL | Status: DC
  Administered 2017-12-16: 150 mg via ORAL
  Filled 2017-12-15: qty 2

## 2017-12-15 MED ORDER — HYDRALAZINE HCL 25 MG PO TABS
25.0000 mg | ORAL_TABLET | Freq: Three times a day (TID) | ORAL | Status: DC
  Administered 2017-12-16 (×3): 25 mg via ORAL
  Filled 2017-12-15 (×3): qty 1

## 2017-12-15 MED ORDER — INSULIN ASPART 100 UNIT/ML ~~LOC~~ SOLN
0.0000 [IU] | SUBCUTANEOUS | Status: DC
Start: 2017-12-15 — End: 2017-12-16

## 2017-12-15 MED ORDER — ACETAMINOPHEN 325 MG PO TABS
650.0000 mg | ORAL_TABLET | ORAL | Status: DC | PRN
Start: 2017-12-15 — End: 2017-12-16

## 2017-12-15 MED ORDER — METOPROLOL TARTRATE 25 MG PO TABS
100.0000 mg | ORAL_TABLET | Freq: Two times a day (BID) | ORAL | Status: DC
  Administered 2017-12-15 – 2017-12-16 (×3): 100 mg via ORAL
  Filled 2017-12-15 (×3): qty 4

## 2017-12-15 NOTE — ED Notes (Signed)
Pt had drawn for labs:  Red Blue Gold Lavender Lt green Dark green x2

## 2017-12-15 NOTE — ED Notes (Signed)
Pt would not eat food provided by cafeteria, warmed her Chicken Noodle soup and she is eating at present

## 2017-12-15 NOTE — ED Notes (Signed)
Attempted to obtain CBG and pt refused

## 2017-12-15 NOTE — ED Notes (Signed)
Bed: MN81 Expected date:  Expected time:  Means of arrival:  Comments: EMS/from rehab/S.I.

## 2017-12-15 NOTE — ED Notes (Signed)
Pt A&O x 3, no distress noted, calm & cooperative. Monitoring for safety..  Foley catheter in place draining clear yellow urine.  Safety sitter at bedside.

## 2017-12-15 NOTE — ED Triage Notes (Signed)
Per [facility] pt is coming from [facility] [place] after recent hospitalization with weakness. Patient reports having suicidal ideations for past two weeks but does not have a plan.

## 2017-12-15 NOTE — ED Provider Notes (Signed)
[facility] DEPT Provider Note   CSN: [id number] Arrival date & time: 12/15/17  1030     History   Chief Complaint Chief Complaint  Patient presents with  . Suicidal    HPI Laura [name] is a 57 y.o. female.  HPI Patient is a 57 year old female with past medical history significant for extensive chronic disease and deconditioning.  Patient has a history of C. difficile CAD CHF nephrotic syndrome and type 2 diabetes.  She is presenting today with passive suicidal ideation.  Patient denies any plan.  But does say that she feels "alone".  She states that she feels abandoned by her family and sad at times.  This is been going on for some time.  She reports that she asked the nurse that delivers medications whether she could have an overdose of medications.    Past Medical History:  Diagnosis Date  . C. difficile colitis   . CAD (coronary artery disease)   . CHF (congestive heart failure) ([place])   . Chronic bronchitis ([place])   . Diabetic peripheral neuropathy ([place])   . Duodenal ulcer   . High cholesterol   . Hypertension   . Nephrotic syndrome   . Type II diabetes mellitus [facility])     Patient Active Problem List   Diagnosis Date Noted  . 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 Labile blood pressure   . Labile blood glucose   . Fall   . Acute pulmonary edema (HCC)   . Head trauma   . Deep venous thrombosis ([place])   . Abnormal chest x-ray   . Vasovagal syncope   . Syncope and collapse   . Diarrhea   . SOB (shortness of breath)   . Hypertensive crisis   . Duodenal ulcer   . Gastrointestinal hemorrhage associated with duodenal ulcer   . Uncontrolled hypertension   . Anticoagulated   . Benign neoplasm of transverse colon   . Abnormal urinalysis   . Nephrotic syndrome   . Rectal bleeding   . Abnormal MRI   . Uncontrolled diabetes mellitus with neurologic complication ([place])   . Diabetic peripheral neuropathy  ([place])   . Weakness of both lower extremities   . Hyperkalemia   . Hypoglycemia   . Type 2 diabetes mellitus with complication, with long-term current use of insulin ([place])   . Neurogenic bladder   . Hypoalbuminemia due to protein-calorie malnutrition ([place])   . AKI (acute kidney injury) ([place])   . Stage 3 chronic kidney disease ([place])   . Physical debility 11/05/2017  . Critical illness myopathy   . Encephalopathy   . Diabetes mellitus type 2 in nonobese (HCC)   . Hyperlipidemia   . Cognitive impairment   . Benign essential HTN   . Chronic diastolic congestive heart failure ([place])   . Chronic kidney disease (CKD), stage IV (severe) ([place])   . Anemia   . Leukocytosis   . Urinary tract infection 10/30/2017  . Pressure injury of skin 10/30/2017    Past Surgical History:  Procedure Laterality Date  . CARDIAC CATHETERIZATION  2018  . CESAREAN SECTION    . COLONOSCOPY WITH PROPOFOL N/A 11/14/2017   Procedure: COLONOSCOPY WITH PROPOFOL;  Surgeon: Yetta Flock, MD;  Location: [place];  Service: Gastroenterology;  Laterality: N/A;  . ESOPHAGOGASTRODUODENOSCOPY (EGD) WITH PROPOFOL N/A 11/14/2017   Procedure: ESOPHAGOGASTRODUODENOSCOPY (EGD) WITH PROPOFOL;  Surgeon: Yetta Flock, MD;  Location: [place];  Service: Gastroenterology;  Laterality: N/A;  .  TUBAL LIGATION      OB History    No data available       Home Medications    Prior to Admission medications   Medication Sig Start Date End Date Taking? Authorizing Provider  acetaminophen (TYLENOL) 325 MG tablet Take 650 mg by mouth every 4 (four) hours as needed for mild pain.    [provider]  Amino Acids-Protein Hydrolys (FEEDING SUPPLEMENT, PRO-STAT SUGAR FREE 64,) LIQD Take 30 mLs by mouth 2 (two) times daily. 11/05/17   Colbert Ewing, MD  bumetanide (BUMEX) 1 MG tablet Take 3 tablets (3 mg total) by mouth 2 (two) times daily. 12/02/17   Love, Ivan Anchors, PA-C  collagenase (SANTYL) ointment Apply  topically 2 (two) times daily. To sacral area. 12/02/17   Love, Ivan Anchors, PA-C  ezetimibe (ZETIA) 10 MG tablet Take 10 mg by mouth daily.    [provider]  hydrALAZINE (APRESOLINE) 25 MG tablet Take 1 tablet (25 mg total) by mouth every 8 (eight) hours. 12/02/17   Love, Ivan Anchors, PA-C  Hydrocortisone (GERHARDT'S BUTT CREAM) CREA Apply 1 application topically 4 (four) times daily. 11/05/17   Colbert Ewing, MD  insulin aspart (NOVOLOG) 100 UNIT/ML injection Inject 0-10 Units into the skin See admin instructions. If blood sugar is <70 call MD, 71-250=0, 251-300=2u, 301-350=4,351-400=6u, 401-500=8u, >500=10u and call MD    [provider]  isosorbide mononitrate (IMDUR) 30 MG 24 hr tablet Take 3 tablets (90 mg total) by mouth daily. 12/03/17   Love, Ivan Anchors, PA-C  lip balm (BLISTEX) OINT Apply 1 application topically as needed for lip care. 12/02/17   Love, Ivan Anchors, PA-C  metoprolol tartrate (LOPRESSOR) 100 MG tablet Take 1 tablet (100 mg total) by mouth 2 (two) times daily. 12/02/17   Love, Ivan Anchors, PA-C  mirtazapine (REMERON) 15 MG tablet Take 15 mg by mouth at bedtime.    [provider]  multivitamin (RENA-VIT) TABS tablet Take 1 tablet by mouth at bedtime. 12/02/17   Love, Ivan Anchors, PA-C  NIFEdipine (PROCARDIA-XL/ADALAT CC) 60 MG 24 hr tablet Take 1 tablet (60 mg total) by mouth 2 (two) times daily. 12/02/17   Love, Ivan Anchors, PA-C  Nutritional Supplements (FEEDING SUPPLEMENT, NEPRO CARB STEADY,) LIQD Take 237 mLs by mouth 3 (three) times daily with meals. 12/02/17   Love, Ivan Anchors, PA-C  pantoprazole (PROTONIX) 40 MG tablet Take 1 tablet (40 mg total) by mouth 2 (two) times daily before a meal. 12/02/17   Love, Ivan Anchors, PA-C  polycarbophil (FIBERCON) 625 MG tablet Take 2 tablets (1,250 mg total) by mouth 2 (two) times daily before lunch and supper. 12/02/17   Love, Ivan Anchors, PA-C  traZODone (DESYREL) 50 MG tablet Take 0.5-1 tablets (25-50 mg total) by mouth at bedtime as needed  for sleep. 12/02/17   Love, Ivan Anchors, PA-C  venlafaxine XR (EFFEXOR-XR) 150 MG 24 hr capsule Take 150 mg by mouth daily with breakfast.    [provider]  venlafaxine XR (EFFEXOR-XR) 150 MG 24 hr capsule Take 1 capsule (150 mg total) by mouth daily with breakfast. 12/03/17   Love, Ivan Anchors, PA-C    Family History Family History  Problem Relation Age of Onset  . Diabetes Father     Social History Social History   Tobacco Use  . Smoking status: Never Smoker  . Smokeless tobacco: Never Used  Substance Use Topics  . Alcohol use: No    Frequency: Never  . Drug use: No  Allergies   Aspirin; Gabapentin; and Statins   Review of Systems Review of Systems  Constitutional: Negative for activity change.  Cardiovascular: Negative for chest pain.  Gastrointestinal: Negative for abdominal pain.     Physical Exam Updated Vital Signs BP 131/68 (BP Location: Left Arm)   Pulse 85   Temp 97.8 F (36.6 C) (Oral)   Resp 17   SpO2 96%   Physical Exam  Constitutional: She is oriented to person, place, and time. She appears well-developed and well-nourished.  Well-appearing 57 year old female.  HENT:  Head: Normocephalic and atraumatic.  Eyes: Right eye exhibits no discharge. Left eye exhibits no discharge.  Cardiovascular: Normal rate and regular rhythm.  Pulmonary/Chest: Effort normal and breath sounds normal. No respiratory distress.  Neurological: She is oriented to person, place, and time. No cranial nerve deficit.  Skin: Skin is warm and dry. She is not diaphoretic.  Psychiatric:  Endorses passive suicidality. No plan.  Nursing note and vitals reviewed.    ED Treatments / Results  Labs (all labs ordered are listed, but only abnormal results are displayed) Labs Reviewed  CBC WITH DIFFERENTIAL/PLATELET  COMPREHENSIVE METABOLIC PANEL    EKG  EKG Interpretation None       Radiology No results found.  Procedures Procedures (including critical care  time)  Medications Ordered in ED Medications - No data to display   Initial Impression / Assessment and Plan / ED Course  I have reviewed the triage vital signs and the nursing notes.  Pertinent labs & imaging results that were available during my care of the patient were reviewed by me and considered in my medical decision making (see chart for details).     Patient is a 57 year old female with past medical history significant for extensive chronic disease and deconditioning.  Patient has a history of C. difficile CAD CHF nephrotic syndrome and type 2 diabetes.  She is presenting today with passive suicidal ideation.  Patient denies any plan.  But does say that she feels "alone".  She states that she feels abandoned by her family and sad at times.  This is been going on for some time.  She reports that she asked the nurse that delivers medications whether she could have an overdose of medications.    11:09 AM Will consult TTS.  I think patient has passive suicidality although she is slightly guarded and distracted when speaking.  So we will have TTS give their opinion.  Final Clinical Impressions(s) / ED Diagnoses   Final diagnoses:  None    ED Discharge Orders    None       Lynton Crescenzo, Fredia Sorrow, MD 12/18/17 1558

## 2017-12-15 NOTE — ED Notes (Signed)
Pharmacy will not be able to verify meds until they receive her updated med list from Nsg home

## 2017-12-15 NOTE — BH Assessment (Signed)
Assessment Note  Laura [name] is a 57 y.o. female who presented to [facility] on voluntary basis with complaint of suicidal ideation and depressive symptoms.  Pt has not been assessed by TTS before.  Pt was transported from [place] facility where Pt is treated for difficulty with walking.  Pt expressed suicidal ideation to the staff there, and they summoned police.  Pt reported that over the last two weeks, she has become increasingly despondent because of health and family issues, and that over the last week, she has become increasingly suicidal.  Pt endorsed the following symptoms:  Suicidal ideation without plan or intent; persistent and unremitting despondency; insomnia (about 3-4 hours per night); loss of appetite (believes she has lost weight, but not sure how much); feelings of hopelessness; loss of pleasure; and isolation.  Pt reported that she has struggled with mood because she is upset that she has trouble walking. She also reported feeling upset because she feels isolated from her family.  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  My family put me in a nursing home, and they never visit.''   Pt currently lives at [facility].  When not there, she lives with her husband and children.  During assessment, Pt presented as alert and oriented.  She had good eye contact and was cooperative.  Pt was gowned and appeared appropriately groomed.  Pt's mood was depressed.  Affect was blunted.  Pt endorsed suicidal ideation without plan or intent, despondency, and other depressive symptoms.  Pt denied abuse, hallucination, homicidal ideation, substance use concerns, and self-injurious behavior.  Pt's speech was normal in rate, rhythm, and volume.  Pt's memory and concentration were intact.  Insight, judgment, and impulse control were deemed fair.  Consulted with Dr. Martinique, who determined that Pt is to be held for 24 hours, stabilized and given medication as  necessary, then re-evaluated in AM.  Diagnosis: F32.2 Major Depressive Disorder, Single Ep., Severe w/o psychotic features Past Medical History:  Past Medical History:  Diagnosis Date  . C. difficile colitis   . CAD (coronary artery disease)   . CHF (congestive heart failure) ([place])   . Chronic bronchitis ([place])   . Diabetic peripheral neuropathy ([place])   . Duodenal ulcer   . High cholesterol   . Hypertension   . Nephrotic syndrome   . Type II diabetes mellitus ([place])     Past Surgical History:  Procedure Laterality Date  . CARDIAC CATHETERIZATION  2018  . CESAREAN SECTION    . COLONOSCOPY WITH PROPOFOL N/A 11/14/2017   Procedure: COLONOSCOPY WITH PROPOFOL;  Surgeon: Yetta Flock, MD;  Location: [place];  Service: Gastroenterology;  Laterality: N/A;  . ESOPHAGOGASTRODUODENOSCOPY (EGD) WITH PROPOFOL N/A 11/14/2017   Procedure: ESOPHAGOGASTRODUODENOSCOPY (EGD) WITH PROPOFOL;  Surgeon: Yetta Flock, MD;  Location: [place];  Service: Gastroenterology;  Laterality: N/A;  . TUBAL LIGATION      Family History:  Family History  Problem Relation Age of Onset  . Diabetes Father     Social History:  reports that  has never smoked. she has never used smokeless tobacco. She reports that she does not drink alcohol or use drugs.  Additional Social History:  Alcohol / Drug Use Pain Medications: See MAr Prescriptions: See MAR Over the Counter: See MAR History of alcohol / drug use?: No history of alcohol / drug abuse  CIWA: CIWA-Ar BP: 137/71 Pulse Rate: 83 COWS:    Allergies:  Allergies  Allergen  Reactions  . Aspirin Hives  . Gabapentin     Excessive sedation (slept for 2 days)  . Statins Other (See Comments)    Joint pain    Home Medications:  (Not in a hospital admission)  OB/GYN Status:  No LMP recorded. Patient is postmenopausal.  General Assessment Data TTS Assessment: In system Is this a Tele or Face-to-Face Assessment?: Face-to-Face Is this  an Initial Assessment or a Re-assessment for this encounter?: Initial Assessment Marital status: Married Is patient pregnant?: No Pregnancy Status: No Living Arrangements: Spouse/significant other, Children Can pt return to current living arrangement?: Yes Admission Status: Voluntary Is patient capable of signing voluntary admission?: Yes Referral Source: Self/Family/Friend Insurance type: [place] Living Arrangements: Spouse/significant other, Children Name of Psychiatrist: None Name of Therapist: None  Education Status Is patient currently in school?: No  Risk to self with the past 6 months Suicidal Ideation: Yes-Currently Present Has patient been a risk to self within the past 6 months prior to admission? : No Suicidal Intent: No Has patient had any suicidal intent within the past 6 months prior to admission? : No Is patient at risk for suicide?: Yes Suicidal Plan?: No Has patient had any suicidal plan within the past 6 months prior to admission? : No Access to Means: No Previous Attempts/Gestures: No Intentional Self Injurious Behavior: None Family Suicide History: No Recent stressful life event(s): Recent negative physical changes, Other (Comment)(Worsening pain; difficulty walking; isolated at SNF) Persecutory voices/beliefs?: No Depression: Yes Depression Symptoms: Despondent, Insomnia, Isolating, Feeling worthless/self pity, Loss of interest in usual pleasures Substance abuse history and/or treatment for substance abuse?: No Suicide prevention information given to non-admitted patients: Not applicable  Risk to Others within the past 6 months Homicidal Ideation: No Does patient have any lifetime risk of violence toward others beyond the six months prior to admission? : No Thoughts of Harm to Others: No Current Homicidal Intent: No Current Homicidal Plan: No Access to Homicidal Means: No History of harm to others?: No Assessment of Violence:  None Noted Does patient have access to weapons?: No Criminal Charges Pending?: No Does patient have a court date: No Is patient on probation?: No  Psychosis Hallucinations: None noted Delusions: None noted  Mental Status Report Appearance/Hygiene: Unremarkable, In hospital gown Eye Contact: Good Motor Activity: Freedom of movement Speech: Unremarkable Level of Consciousness: Alert Mood: Depressed Affect: Appropriate to circumstance Anxiety Level: None Thought Processes: Coherent, Relevant Judgement: Partial Orientation: Person, Place, Time, Situation Obsessive Compulsive Thoughts/Behaviors: None  Cognitive Functioning Concentration: Normal Memory: Remote Intact, Recent Intact Is patient IDD: No Is patient DD?: No Insight: Fair Impulse Control: Good Appetite: Poor Sleep: Decreased Total Hours of Sleep: 4 Vegetative Symptoms: None  ADLScreening [facility] Assessment Services) Patient's cognitive ability adequate to safely complete daily activities?: Yes Patient able to express need for assistance with ADLs?: Yes Independently performs ADLs?: Yes (appropriate for developmental age)  Prior Inpatient Therapy Prior Inpatient Therapy: No  Prior Outpatient Therapy Prior Outpatient Therapy: No Does patient have an ACCT team?: No Does patient have Intensive In-House Services?  : No Does patient have [facility] services? : No Does patient have P4CC services?: No  ADL Screening (condition at time of admission) Patient's cognitive ability adequate to safely complete daily activities?: Yes Is the patient deaf or have difficulty hearing?: No Does the patient have difficulty seeing, even when wearing glasses/contacts?: No Does the patient have difficulty concentrating, remembering, or making decisions?: No Patient able to express need  for assistance with ADLs?: Yes Does the patient have difficulty dressing or bathing?: No Independently performs ADLs?: Yes (appropriate for  developmental age)  Home Assistive Devices/Equipment Home Assistive Devices/Equipment: None  Therapy Consults (therapy consults require a physician order) PT Evaluation Needed: No OT Evalulation Needed: No Abuse/Neglect Assessment (Assessment to be complete while patient is alone) Abuse/Neglect Assessment Can Be Completed: Yes Physical Abuse: Denies Verbal Abuse: Denies Sexual Abuse: Denies Exploitation of patient/patient's resources: Denies Self-Neglect: Denies Values / Beliefs Cultural Requests During Hospitalization: None Spiritual Requests During Hospitalization: None Consults Spiritual Care Consult Needed: No Social Work Consult Needed: No Regulatory affairs officer (For Healthcare) Does Patient Have a Medical Advance Directive?: Yes Type of Advance Directive: Living will    Additional Information 1:1 In Past 12 Months?: No CIRT Risk: No Elopement Risk: No Does patient have medical clearance?: Yes     Disposition:  Disposition Initial Assessment Completed for this Encounter: Yes Patient referred to: Other (Comment)(Observe overnight, stabilize, AM eval)  On Site Evaluation by:   Reviewed with Physician:    Laurena Slimmer Jaena Brocato 12/15/2017 11:36 AM

## 2017-12-16 DIAGNOSIS — R4587 Impulsiveness: Secondary | ICD-10-CM | POA: Diagnosis not present

## 2017-12-16 DIAGNOSIS — F4321 Adjustment disorder with depressed mood: Secondary | ICD-10-CM | POA: Diagnosis not present

## 2017-12-16 NOTE — BH Assessment (Signed)
[facility] Assessment Progress Note  Per Buford Dresser, DO, this pt does not require psychiatric hospitalization at this time.  Pt is to be discharged from [facility] with recommendation to follow up with an outpatient therapist.  This writer spoke to pt; she reports that she would prefer to contact a provider at her own initiative to schedule an intake appointment.  Discharge instructions advise pt to follow up with the [facility], with [place], or with [facility] at her earliest opportunity to schedule an intake appointment with a therapist.  Pt's nurse, Randi, has been notified.  Jalene Mullet, [place] Triage Specialist [phone]

## 2017-12-16 NOTE — ED Notes (Signed)
Report to [employer].

## 2017-12-16 NOTE — BHH Suicide Risk Assessment (Signed)
Suicide Risk Assessment  Discharge Assessment   [facility] Discharge Suicide Risk Assessment   Principal Problem: Adjustment disorder with depressed mood Discharge Diagnoses:  Patient Active Problem List   Diagnosis Date Noted  . Adjustment disorder with depressed mood [F43.21] 12/16/2017  . 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 Labile blood pressure [R09.89]   . Labile blood glucose [R73.09]   . Fall [W19.XXXA]   . Acute pulmonary edema (HCC) [J81.0]   . Head trauma [S09.90XA]   . Deep venous thrombosis (HCC) [I82.409]   . Abnormal chest x-ray [R93.89]   . Vasovagal syncope [R55]   . Syncope and collapse [R55]   . Diarrhea [R19.7]   . SOB (shortness of breath) [R06.02]   . Hypertensive crisis [I16.9]   . Duodenal ulcer [K26.9]   . Gastrointestinal hemorrhage associated with duodenal ulcer [K26.4]   . Uncontrolled hypertension [I10]   . Anticoagulated [Z79.01]   . Benign neoplasm of transverse colon [D12.3]   . Abnormal urinalysis [R82.90]   . Nephrotic syndrome [N04.9]   . Rectal bleeding [K62.5]   . Abnormal MRI [R93.89]   . Uncontrolled diabetes mellitus with neurologic complication (HCC) [H84.69, E11.65]   . Diabetic peripheral neuropathy ([employer]) [E11.42]   . Weakness of both lower extremities [R29.898]   . Hyperkalemia [E87.5]   . Hypoglycemia [E16.2]   . Type 2 diabetes mellitus with complication, with long-term current use of insulin (HCC) [E11.8, Z79.4]   . Neurogenic bladder [N31.9]   . Hypoalbuminemia due to protein-calorie malnutrition ([place]) [E46]   . AKI (acute kidney injury) ([place]) [N17.9]   . Stage 3 chronic kidney disease ([place]) [N18.3]   . Physical debility [R53.81] 11/05/2017  . 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 Encephalopathy [G93.40]   . Diabetes mellitus type 2 in nonobese (HCC) [E11.9]   . Hyperlipidemia [E78.5]   . Cognitive impairment [R41.89]   . Benign essential HTN [I10]   . Chronic diastolic congestive  heart failure ([place]) [I50.32]   . Chronic kidney disease (CKD), stage IV (severe) ([place]) [N18.4]   . Anemia [D64.9]   . Leukocytosis [D72.829]   . Urinary tract infection [N39.0] 10/30/2017  . Pressure injury of skin [L89.90] 10/30/2017    Total Time spent with patient: 30 minutes  Musculoskeletal: Strength & Muscle Tone: within normal limits Gait & Station: normal Patient leans: N/A  Psychiatric Specialty Exam: Physical Exam  Constitutional: She appears well-developed and well-nourished.  HENT:  Head: Normocephalic.  Respiratory: Effort normal.  Musculoskeletal: Normal range of motion.  Neurological: She is alert.  Psychiatric: Her speech is normal and behavior is normal. Thought content normal. Cognition and memory are normal. She expresses impulsivity. She exhibits a depressed mood.   Review of Systems  Psychiatric/Behavioral: Positive for depression. Negative for hallucinations, memory loss, substance abuse and suicidal ideas. The patient is not nervous/anxious and does not have insomnia.   All other systems reviewed and are negative.  Blood pressure 127/64, pulse 85, temperature 98.1 F (36.7 C), temperature source Oral, resp. rate 16, SpO2 98 %.There is no height or weight on file to calculate BMI. General Appearance: Casual Eye Contact:  Good Speech:  Garbled Volume:  Decreased Mood:  Depressed Affect:  Congruent Thought Process:  Disorganized Orientation:  Full (Time, Place, and Person) Thought Content:  Logical Suicidal Thoughts:  No Homicidal Thoughts:  No Memory:  Immediate;   Good Recent;   Good Remote;   Fair Judgement:  Impaired Insight:  Lacking Psychomotor Activity:  Decreased Concentration:  Concentration: Good and Attention Span: Good Recall:  [place] of Knowledge:  Good Language:  Good Akathisia:  No Handed:  Right AIMS (if indicated):    Assets:  Agricultural consultant Housing ADL's:  Intact Cognition:   WNL   Mental Status Per Nursing Assessment::   On Admission:   Depressed  Demographic Factors:  Low socioeconomic status and Unemployed  Loss Factors: NA  Historical Factors: Impulsivity  Risk Reduction Factors:   Sense of responsibility to family  Continued Clinical Symptoms:  Depression:   Impulsivity  Cognitive Features That Contribute To Risk:  Closed-mindedness    Suicide Risk:  Minimal: No identifiable suicidal ideation.  Patients presenting with no risk factors but with morbid ruminations; may be classified as minimal risk based on the severity of the depressive symptoms    Plan Of Care/Follow-up recommendations:  Activity:  as tolerated Diet:  Heart Healthy  Ethelene Hal, NP 12/16/2017, 12:41 PM

## 2017-12-16 NOTE — Progress Notes (Signed)
LCSW following for disposition(pending) and family/facility collateral.  Patient is from [facility] where she has been a short term resident for the last 2 weeks. Patient has had multiple medical issues since the beginning of January 2019 causing her to lose independence in self care and this has been very frustrating to patient.  RN Cassandria Santee was called at the facility.  She reports overall patient feels abandoned by her family as her husband still has to work and take care of their home. She reports patient wants to be at home and the goal is for home once she cna safely return. Family comes to visit her daily/weekly.  Patient has been participating in therapy and improving, however it is a slow process.  RN reports with her being so young compared to other members at nursing home, this has caused an emotional response and again frustration.  Facility is agreeable for her to return home. They are working to see if they can arrange for transportation or if patient will need to return by PTAR. Patient's husband to be updated once final disposition made. Facility has ability to use contracted psychiatric services if patient is interested and if recommendations come from ED Psych MD.  Will discuss with team and act appropriately. No other concerns at this time.  Lane Hacker, MSW Clinical Social Work: [employer]maker Coverage for :  [phone]

## 2017-12-16 NOTE — Consult Note (Addendum)
[facility] Consult   Reason for Consult:  Depression Referring Physician:  EDP Patient Identification: Laura [name] MRN:  [id number] Principal Diagnosis: Adjustment disorder with depressed mood Diagnosis:   Patient Active Problem List   Diagnosis Date Noted  . Adjustment disorder with depressed mood [F43.21] 12/16/2017  . 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 Labile blood pressure [R09.89]   . Labile blood glucose [R73.09]   . Fall [W19.XXXA]   . Acute pulmonary edema (HCC) [J81.0]   . Head trauma [S09.90XA]   . Deep venous thrombosis (HCC) [I82.409]   . Abnormal chest x-ray [R93.89]   . Vasovagal syncope [R55]   . Syncope and collapse [R55]   . Diarrhea [R19.7]   . SOB (shortness of breath) [R06.02]   . Hypertensive crisis [I16.9]   . Duodenal ulcer [K26.9]   . Gastrointestinal hemorrhage associated with duodenal ulcer [K26.4]   . Uncontrolled hypertension [I10]   . Anticoagulated [Z79.01]   . Benign neoplasm of transverse colon [D12.3]   . Abnormal urinalysis [R82.90]   . Nephrotic syndrome [N04.9]   . Rectal bleeding [K62.5]   . Abnormal MRI [R93.89]   . Uncontrolled diabetes mellitus with neurologic complication (HCC) [V94.80, E11.65]   . Diabetic peripheral neuropathy ([place]) [E11.42]   . Weakness of both lower extremities [R29.898]   . Hyperkalemia [E87.5]   . Hypoglycemia [E16.2]   . Type 2 diabetes mellitus with complication, with long-term current use of insulin (HCC) [E11.8, Z79.4]   . Neurogenic bladder [N31.9]   . Hypoalbuminemia due to protein-calorie malnutrition ([place]) [E46]   . AKI (acute kidney injury) ([place]) [N17.9]   . Stage 3 chronic kidney disease ([place]) [N18.3]   . Physical debility [R53.81] 11/05/2017  . 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 Encephalopathy [G93.40]   . Diabetes mellitus type 2 in nonobese (HCC) [E11.9]   . Hyperlipidemia [E78.5]   . Cognitive impairment [R41.89]   .  Benign essential HTN [I10]   . Chronic diastolic congestive heart failure ([place]) [I50.32]   . Chronic kidney disease (CKD), stage IV (severe) ([place]) [N18.4]   . Anemia [D64.9]   . Leukocytosis [D72.829]   . Urinary tract infection [N39.0] 10/30/2017  . Pressure injury of skin [L89.90] 10/30/2017    Total Time spent with patient: 45 minutes  Subjective:   Laura [name] is a 57 y.o. female patient admitted with depression.  HPI:  Pt was seen and chart reviewed with treatment team and Dr Mariea Clonts. Pt denies suicidal/homicidal ideation, denies auditory/visual hallucinations and does not appear to be responding to internal stimuli. Pt stated she lives at [facility] rehab facility and is trying to get reconditioned so she can go back to her family. Pt got upset yesterday and told the staff she didn't want to be here anymore and to give her too much of her medication. Pt denies that she wants to hurt herself she just wants to be back home with her husband. Pt stated she thinks her family is blaming her because she is not better yet and this upsets her. Pt is psychiatrically clear for discharge back to her facility.   Past Psychiatric History: As above  Risk to Self: None Risk to Others: None Prior Inpatient Therapy: Prior Inpatient Therapy: No Prior Outpatient Therapy: Prior Outpatient Therapy: No Does patient have an ACCT team?: No Does patient have Intensive In-House Services?  : No Does patient have [facility] services? : No Does patient have P4CC services?: No  Past Medical History:  Past Medical History:  Diagnosis Date  . C. difficile colitis   . CAD (coronary artery disease)   . CHF (congestive heart failure) ([place])   . Chronic bronchitis ([place])   . Diabetic peripheral neuropathy ([place])   . Duodenal ulcer   . High cholesterol   . Hypertension   . Nephrotic syndrome   . Type II diabetes mellitus ([place])     Past Surgical History:  Procedure Laterality Date  . CARDIAC CATHETERIZATION   2018  . CESAREAN SECTION    . COLONOSCOPY WITH PROPOFOL N/A 11/14/2017   Procedure: COLONOSCOPY WITH PROPOFOL;  Surgeon: Yetta Flock, MD;  Location: [place];  Service: Gastroenterology;  Laterality: N/A;  . ESOPHAGOGASTRODUODENOSCOPY (EGD) WITH PROPOFOL N/A 11/14/2017   Procedure: ESOPHAGOGASTRODUODENOSCOPY (EGD) WITH PROPOFOL;  Surgeon: Yetta Flock, MD;  Location: [place];  Service: Gastroenterology;  Laterality: N/A;  . TUBAL LIGATION     Family History:  Family History  Problem Relation Age of Onset  . Diabetes Father    Family Psychiatric  History: Unknown Social History:  Social History   Substance and Sexual Activity  Alcohol Use No  . Frequency: Never     Social History   Substance and Sexual Activity  Drug Use No    Social History   Socioeconomic History  . Marital status: Married    Spouse name: None  . Number of children: None  . Years of education: None  . Highest education level: None  Social Needs  . Financial resource strain: Not hard at all  . Food insecurity - worry: Never true  . Food insecurity - inability: None  . Transportation needs - medical: No  . Transportation needs - non-medical: No  Occupational History  . None  Tobacco Use  . Smoking status: Never Smoker  . Smokeless tobacco: Never Used  Substance and Sexual Activity  . Alcohol use: No    Frequency: Never  . Drug use: No  . Sexual activity: No  Other Topics Concern  . None  Social History Narrative  . None   Additional Social History: N/A    Allergies:   Allergies  Allergen Reactions  . Aspirin Hives  . Gabapentin     Excessive sedation (slept for 2 days)  . Statins Other (See Comments)    Joint pain    Labs:  Results for orders placed or performed during the hospital encounter of 12/15/17 (from the past 48 hour(s))  CBC with Differential/Platelet     Status: Abnormal   Collection Time: 12/15/17 11:29 AM  Result Value Ref Range   WBC 9.6  4.0 - 10.5 K/uL   RBC 3.53 (L) 3.87 - 5.11 MIL/uL   Hemoglobin 10.1 (L) 12.0 - 15.0 g/dL   HCT 30.4 (L) 36.0 - 46.0 %   MCV 86.1 78.0 - 100.0 fL   MCH 28.6 26.0 - 34.0 pg   MCHC 33.2 30.0 - 36.0 g/dL   RDW 15.5 11.5 - 15.5 %   Platelets 377 150 - 400 K/uL   Neutrophils Relative % 64 %   Neutro Abs 6.2 1.7 - 7.7 K/uL   Lymphocytes Relative 27 %   Lymphs Abs 2.6 0.7 - 4.0 K/uL   Monocytes Relative 7 %   Monocytes Absolute 0.6 0.1 - 1.0 K/uL   Eosinophils Relative 2 %   Eosinophils Absolute 0.2 0.0 - 0.7 K/uL   Basophils Relative 0 %   Basophils Absolute 0.0 0.0 - 0.1 K/uL    Comment: Performed  at [facility], [place] [address], [place], [place] [address]  Comprehensive metabolic panel     Status: Abnormal   Collection Time: 12/15/17 11:29 AM  Result Value Ref Range   Sodium 139 135 - 145 mmol/L   Potassium 4.2 3.5 - 5.1 mmol/L   Chloride 105 101 - 111 mmol/L   CO2 22 22 - 32 mmol/L   Glucose, Bld 100 (H) 65 - 99 mg/dL   BUN 98 (H) 6 - 20 mg/dL    Comment: RESULTS CONFIRMED BY MANUAL DILUTION   Creatinine, Ser 3.94 (H) 0.44 - 1.00 mg/dL   Calcium 8.6 (L) 8.9 - 10.3 mg/dL   Total Protein 6.2 (L) 6.5 - 8.1 g/dL   Albumin 2.5 (L) 3.5 - 5.0 g/dL   AST 20 15 - 41 U/L   ALT 16 14 - 54 U/L   Alkaline Phosphatase 134 (H) 38 - 126 U/L   Total Bilirubin 0.3 0.3 - 1.2 mg/dL   GFR calc non Af Amer 12 (L) >60 mL/min   GFR calc Af Amer 14 (L) >60 mL/min    Comment: (NOTE) The eGFR has been calculated using the CKD EPI equation. This calculation has not been validated in all clinical situations. eGFR's persistently <60 mL/min signify possible Chronic Kidney Disease.    Anion gap 12 5 - 15    Comment: Performed at [facility], [place] [address] [place], [place] [address]  CBG monitoring, ED     Status: None   Collection Time: 12/15/17  5:50 PM  Result Value Ref Range   Glucose-Capillary 68 65 - 99 mg/dL  CBG monitoring, ED     Status: None    Collection Time: 12/15/17 10:44 PM  Result Value Ref Range   Glucose-Capillary 90 65 - 99 mg/dL   Comment 1 Notify RN    Comment 2 Document in Chart     Current Facility-Administered Medications  Medication Dose Route Frequency Provider Last Rate Last Dose  . acetaminophen (TYLENOL) tablet 650 mg  650 mg Oral Q4H PRN Mackuen, Courteney Lyn, MD      . bumetanide (BUMEX) tablet 3 mg  3 mg Oral BID Mackuen, Courteney Lyn, MD   3 mg at 12/16/17 0959  . ezetimibe (ZETIA) tablet 10 mg  10 mg Oral Daily Mackuen, Courteney Lyn, MD   10 mg at 12/16/17 0958  . hydrALAZINE (APRESOLINE) tablet 25 mg  25 mg Oral Q8H Mackuen, Courteney Lyn, MD   25 mg at 12/16/17 0959  . insulin aspart (novoLOG) injection 0-10 Units  0-10 Units Subcutaneous See admin instructions Mackuen, Courteney Lyn, MD      . isosorbide mononitrate (IMDUR) 24 hr tablet 90 mg  90 mg Oral Daily Mackuen, Courteney Lyn, MD   90 mg at 12/16/17 0959  . metoprolol tartrate (LOPRESSOR) tablet 100 mg  100 mg Oral BID Mackuen, Courteney Lyn, MD   100 mg at 12/16/17 0958  . mirtazapine (REMERON) tablet 15 mg  15 mg Oral QHS Mackuen, Courteney Lyn, MD   15 mg at 12/15/17 2245  . NIFEdipine (PROCARDIA-XL/ADALAT CC) 24 hr tablet 60 mg  60 mg Oral BID Mackuen, Courteney Lyn, MD   60 mg at 12/16/17 0959  . pantoprazole (PROTONIX) EC tablet 40 mg  40 mg Oral BID AC Mackuen, Courteney Lyn, MD   40 mg at 12/16/17 0957  . traZODone (DESYREL) tablet 25-50 mg  25-50 mg Oral QHS PRN Mackuen, Courteney Lyn, MD   50 mg at  12/16/17 0013  . venlafaxine XR (EFFEXOR-XR) 24 hr capsule 150 mg  150 mg Oral Q breakfast Mackuen, Courteney Lyn, MD   150 mg at 12/16/17 3267   Current Outpatient Medications  Medication Sig Dispense Refill  . acetaminophen (TYLENOL) 325 MG tablet Take 650 mg by mouth every 4 (four) hours as needed for mild pain.    . Amino Acids-Protein Hydrolys (FEEDING SUPPLEMENT, PRO-STAT SUGAR FREE 64,) LIQD Take 30 mLs by mouth 2 (two) times daily.  12 Bottle 4  . bumetanide (BUMEX) 1 MG tablet Take 3 tablets (3 mg total) by mouth 2 (two) times daily.    . collagenase (SANTYL) ointment Apply topically 2 (two) times daily. To sacral area. (Patient taking differently: Apply topically daily. To sacral area.) 15 g 0  . ezetimibe (ZETIA) 10 MG tablet Take 10 mg by mouth daily.    . hydrALAZINE (APRESOLINE) 25 MG tablet Take 1 tablet (25 mg total) by mouth every 8 (eight) hours.    . insulin aspart (NOVOLOG) 100 UNIT/ML injection Inject 0-10 Units into the skin See admin instructions. If blood sugar is <60 call MD, 71-250=0, 251-300=2u, 301-350=4,351-400=6u, 401-500=8u, >500=10u and call MD    . isosorbide mononitrate (IMDUR) 30 MG 24 hr tablet Take 3 tablets (90 mg total) by mouth daily.    Marland Kitchen lip balm (BLISTEX) OINT Apply 1 application topically as needed for lip care.    . metoprolol tartrate (LOPRESSOR) 100 MG tablet Take 1 tablet (100 mg total) by mouth 2 (two) times daily.    . miconazole (MONISTAT 7) 2 % vaginal cream Place 1 Applicatorful vaginally at bedtime. For 3 days    . mirtazapine (REMERON) 15 MG tablet Take 15 mg by mouth at bedtime.    . multivitamin (RENA-VIT) TABS tablet Take 1 tablet by mouth at bedtime.  0  . NIFEdipine (PROCARDIA-XL/ADALAT CC) 60 MG 24 hr tablet Take 1 tablet (60 mg total) by mouth 2 (two) times daily.    . pantoprazole (PROTONIX) 40 MG tablet Take 1 tablet (40 mg total) by mouth 2 (two) times daily before a meal.    . polycarbophil (FIBERCON) 625 MG tablet Take 2 tablets (1,250 mg total) by mouth 2 (two) times daily before lunch and supper.    . sucralfate (CARAFATE) 1 GM/10ML suspension Take 1 g by mouth 3 (three) times daily between meals.    . traZODone (DESYREL) 50 MG tablet Take 0.5-1 tablets (25-50 mg total) by mouth at bedtime as needed for sleep.    Marland Kitchen venlafaxine XR (EFFEXOR-XR) 150 MG 24 hr capsule Take 1 capsule (150 mg total) by mouth daily with breakfast.    . Hydrocortisone (GERHARDT'S BUTT CREAM)  CREA Apply 1 application topically 4 (four) times daily. (Patient not taking: Reported on 12/15/2017) 10 each 6  . Nutritional Supplements (FEEDING SUPPLEMENT, NEPRO CARB STEADY,) LIQD Take 237 mLs by mouth 3 (three) times daily with meals. (Patient not taking: Reported on 12/15/2017)  0    Musculoskeletal: Strength & Muscle Tone: within normal limits Gait & Station: normal Patient leans: N/A  Psychiatric Specialty Exam: Physical Exam  Nursing note and vitals reviewed. Constitutional: She is oriented to person, place, and time. She appears well-developed and well-nourished.  HENT:  Head: Normocephalic.  Neck: Normal range of motion.  Respiratory: Effort normal.  Musculoskeletal: Normal range of motion.  Neurological: She is alert and oriented to person, place, and time.  Psychiatric: Her speech is normal and behavior is normal. Thought content normal. Cognition and  memory are normal. She expresses impulsivity. She exhibits a depressed mood.    Review of Systems  Psychiatric/Behavioral: Positive for depression. Negative for hallucinations, memory loss, substance abuse and suicidal ideas. The patient is not nervous/anxious and does not have insomnia.   All other systems reviewed and are negative.   Blood pressure 127/64, pulse 85, temperature 98.1 F (36.7 C), temperature source Oral, resp. rate 16, SpO2 98 %.There is no height or weight on file to calculate BMI.  General Appearance: Casual  Eye Contact:  Good  Speech:  Garbled  Volume:  Decreased  Mood:  Depressed  Affect:  Congruent  Thought Process:  Disorganized  Orientation:  Full (Time, Place, and Person)  Thought Content:  Logical  Suicidal Thoughts:  No  Homicidal Thoughts:  No  Memory:  Immediate;   Good Recent;   Good Remote;   Fair  Judgement:  Impaired  Insight:  Lacking  Psychomotor Activity:  Decreased  Concentration:  Concentration: Good and Attention Span: Good  Recall:  [place] of Knowledge:  Good   Language:  Good  Akathisia:  No  Handed:  Right  AIMS (if indicated):   N/A  Assets:  Agricultural consultant Housing  ADL's:  Intact  Cognition:  WNL  Sleep:   N/A     Treatment Plan Summary: Plan Adjustment disorder with depressed mood Discharge Home Follow up with the facility PCP and therapist Take all medications as prescribed  Disposition: No evidence of imminent risk to self or others at present.   Patient does not meet criteria for psychiatric inpatient admission. Supportive therapy provided about ongoing stressors.  Ethelene Hal, NP 12/16/2017 12:22 PM   Patient seen face-to-face for psychiatric evaluation, chart reviewed and case discussed with the physician extender and developed treatment plan. Reviewed the information documented and agree with the treatment plan.  Buford Dresser, DO 12/16/17 4:27 PM

## 2017-12-16 NOTE — Discharge Instructions (Signed)
For your behavioral health needs, you are advised to follow up with an outpatient therapist.  Contact one of the following providers at your earliest opportunity to ask about scheduling an intake appointment with a therapist:       [facility] [place]. [address] [place], [place] [address]      [phone]       [facility]      [address] [place], [place] [address]      [phone]       [place] [facility]      [address]      [place], [place] [address]      [phone]

## 2017-12-16 NOTE — ED Notes (Signed)
1 CELL PHONE AND CHARGER CABLE LOCKED IN LOCKER # 14.

## 2017-12-16 NOTE — Progress Notes (Cosign Needed)
12:25 pm:   CSW intern called patient's husband, Grizelda Piscopo [phone]) to update husband on patients discharge plan. CSW intern left a voicemail for husband to call this Probation officer back.

## 2018-01-05 ENCOUNTER — Encounter
Payer: No Typology Code available for payment source | Attending: Physical Medicine & Rehabilitation | Admitting: Physical Medicine & Rehabilitation

## 2018-02-02 ENCOUNTER — Ambulatory Visit: Payer: No Typology Code available for payment source | Admitting: Gastroenterology

## 2018-08-12 IMAGING — CT CT HEAD W/O CM
3 of 4 series · 15 of 47 positions shown, 18 images · non-contrast
Comparison: 11/11/2017

CLINICAL DATA: Fall.  Hit top of head.

EXAM:
CT HEAD WITHOUT CONTRAST
TECHNIQUE: Contiguous axial images were obtained from the base of the skull
through the vertex without intravenous contrast.

[Series 4: head 2.0 h70h · axial · 0.39mm/px · z∈[-131,-9]mm · 9 of 77 slices shown, 12 images]
[im 8/77  brain]
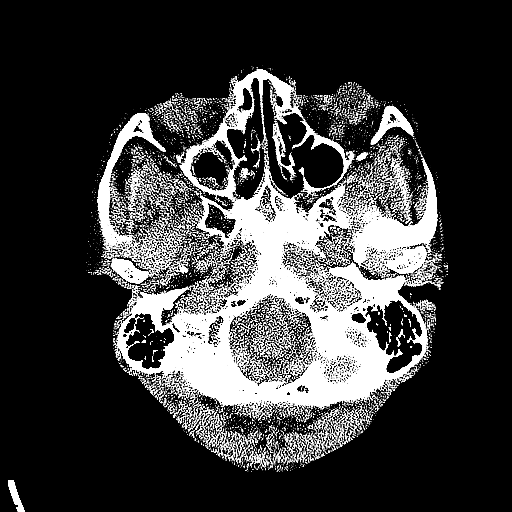
[im 8/77  bone]
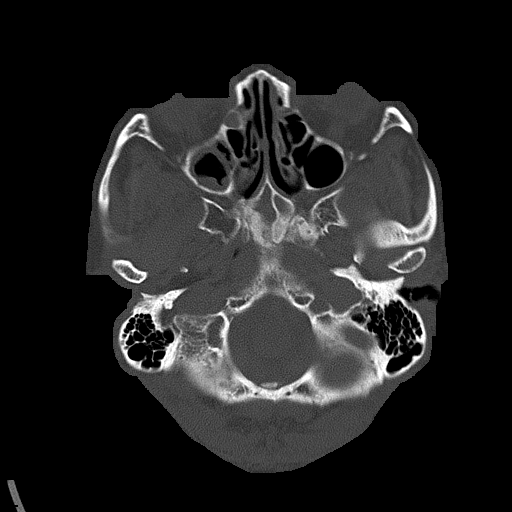
[im 16/77  brain]
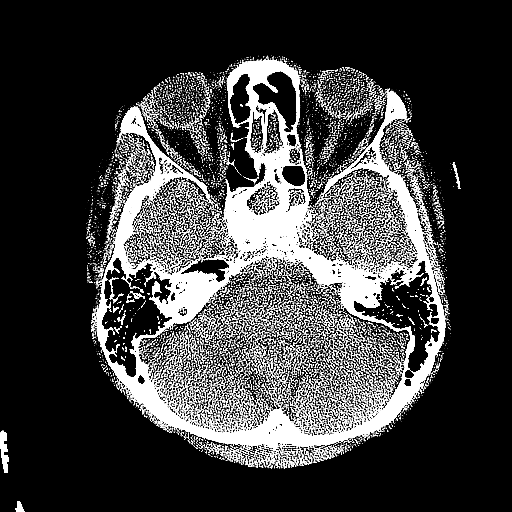
[im 23/77  brain]
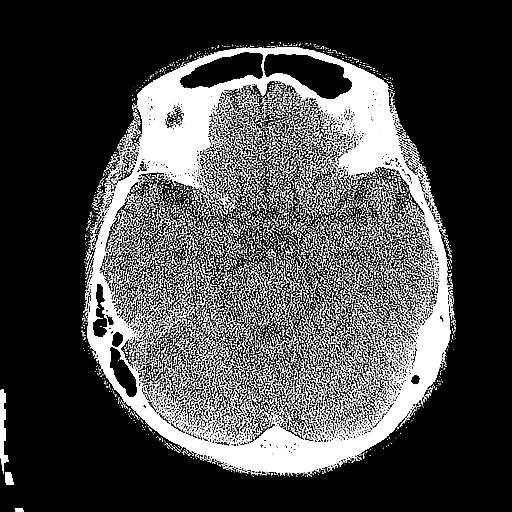
[im 31/77  brain]
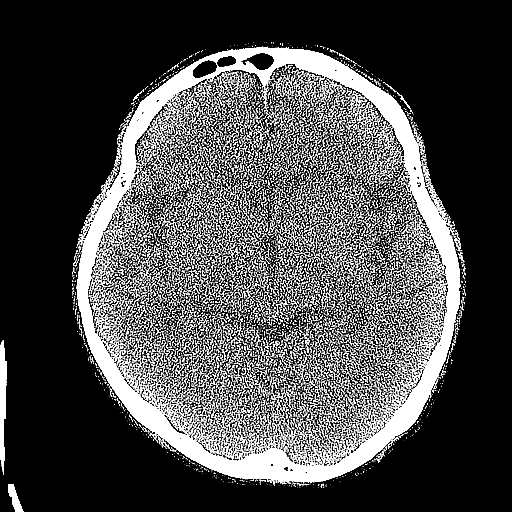
[im 39/77  brain]
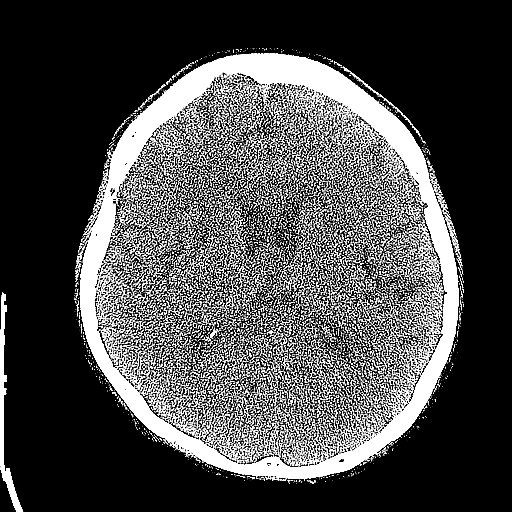
[im 39/77  bone]
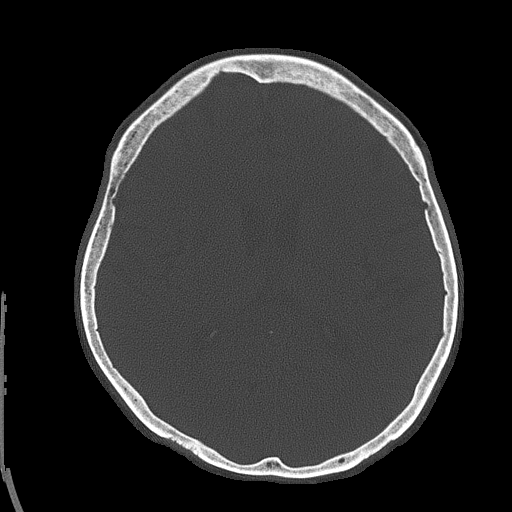
[im 46/77  brain]
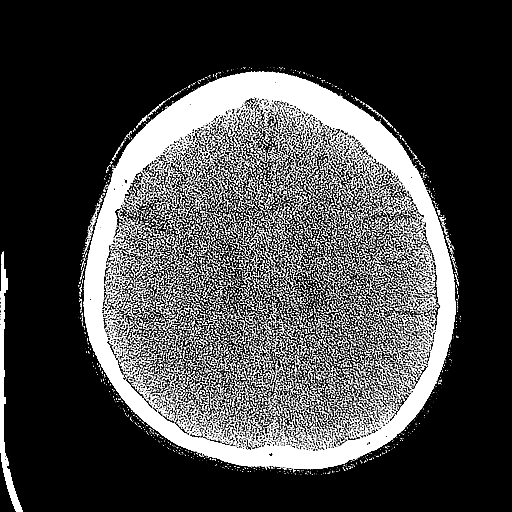
[im 54/77  brain]
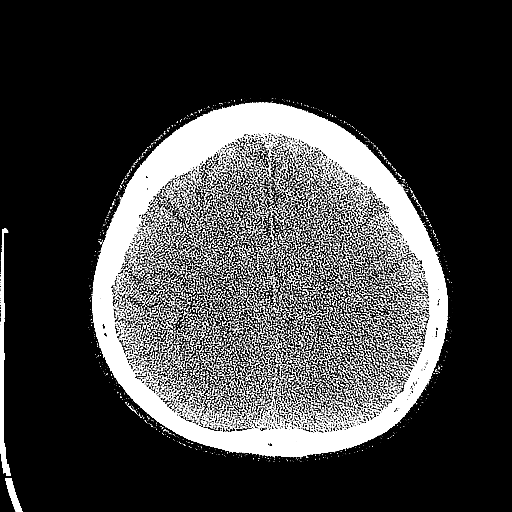
[im 61/77  brain]
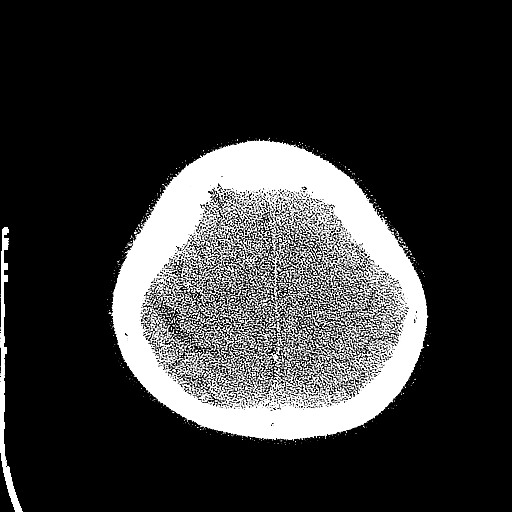
[im 69/77  brain]
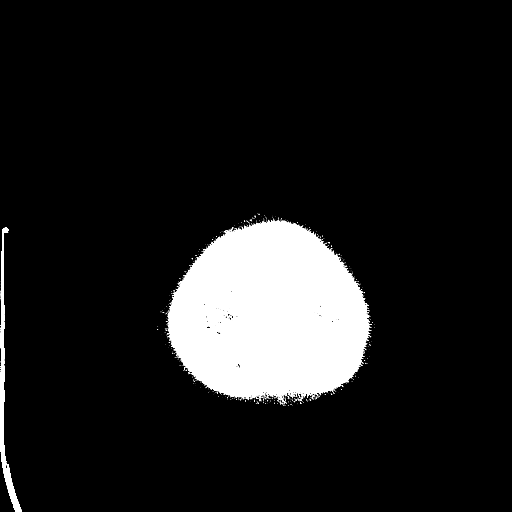
[im 69/77  bone]
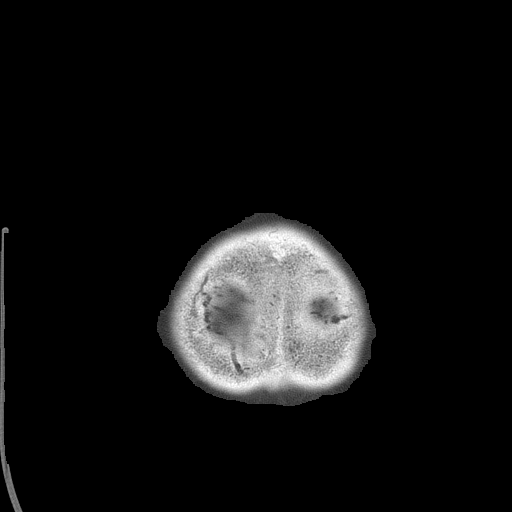

[Series 5: head 3.0 mpr cor · coronal · 0.30mm/px · 3 of 59 slices shown]
[im 20/59  brain]
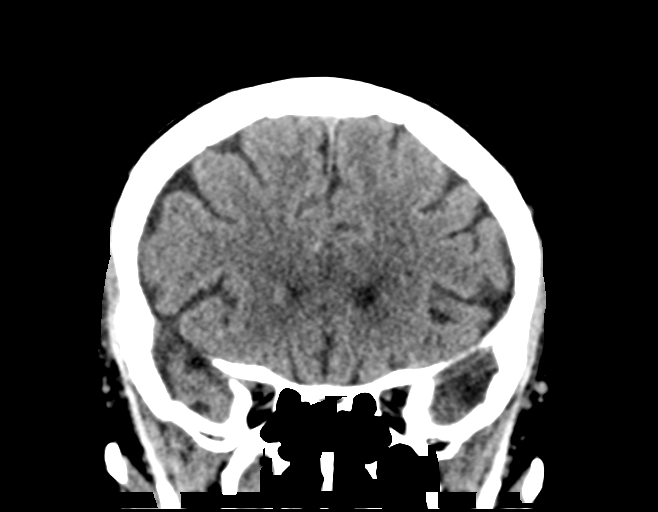
[im 26/59  brain]
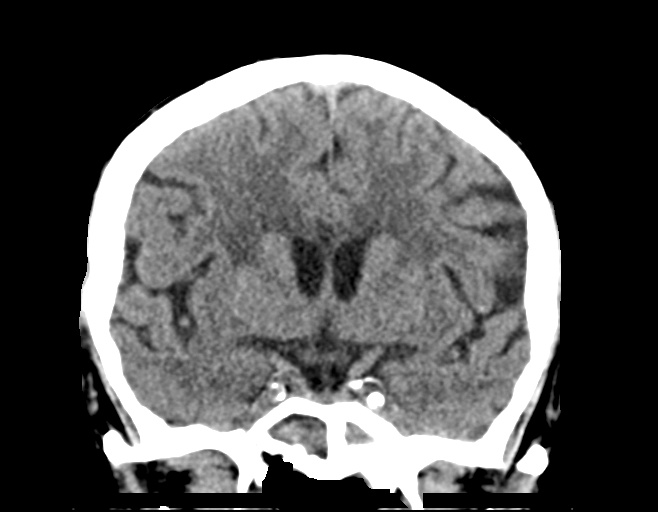
[im 33/59  brain]
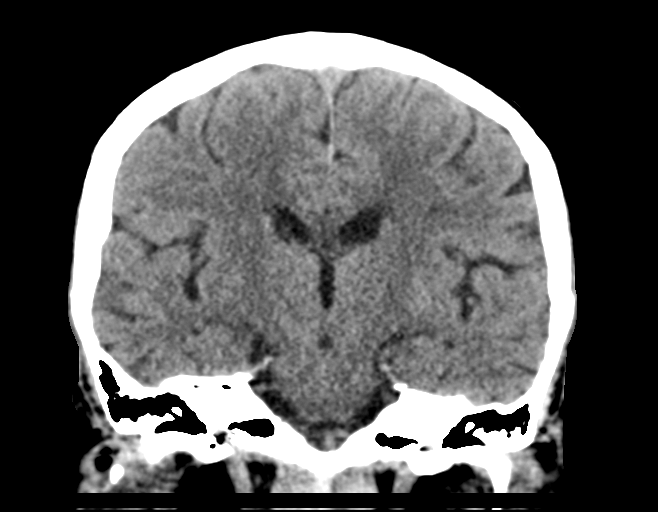

[Series 6: head 3.0 mpr sag · sagittal · 0.30mm/px · 3 of 57 slices shown]
[im 19/57  brain]
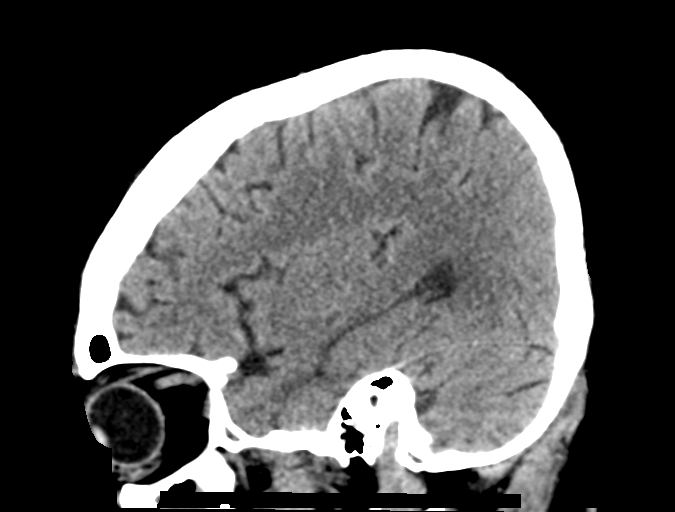
[im 29/57  brain]
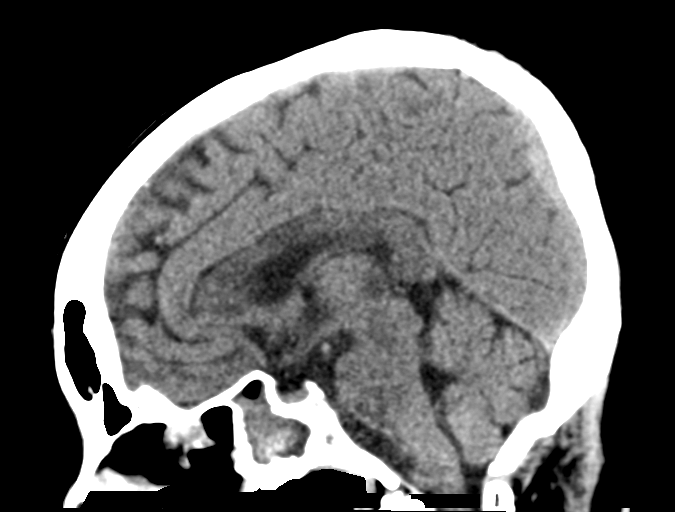
[im 38/57  brain]
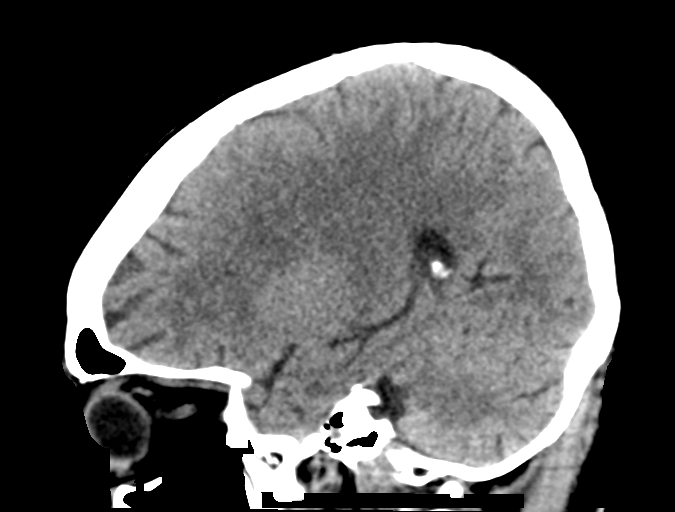

[15 of 47 positions shown; findings below may reference images not displayed]

FINDINGS: Brain: Chronic right basal ganglia lacunar infarct noted. Mild
patchy low attenuation in the subcortical and periventricular white
matter noted compatible with chronic small vessel ischemic change.
No evidence for acute brain infarct, intracranial hemorrhage or
mass.

Vascular: No hyperdense vessel or unexpected calcification.

Skull: Normal. Negative for fracture or focal lesion.

Sinuses/Orbits: There is chronic appearing asymmetric opacification
of the right maxillary sinus, there is also chronic opacification of
the sphenoid sinus.

Other: None
IMPRESSION: 1. No acute intracranial abnormality.
2. Chronic small vessel ischemic change. Old right basal ganglia
infarct noted.
3. Chronic right maxillary sinus and sphenoid sinus opacification.

## 2018-11-14 ENCOUNTER — Encounter: Payer: Self-pay | Admitting: Gastroenterology

## 2020-07-05 DEATH — deceased
